# Patient Record
Sex: Female | Born: 1965 | Hispanic: No | Smoking: Never smoker
Health system: Southern US, Community
[De-identification: ages and names within clinical notes are randomized; demographics above are authoritative.]

## PROBLEM LIST (undated history)

## (undated) ENCOUNTER — Inpatient Hospital Stay (HOSPITAL_COMMUNITY): Payer: Medicare Other

## (undated) DIAGNOSIS — G43909 Migraine, unspecified, not intractable, without status migrainosus: Secondary | ICD-10-CM

## (undated) DIAGNOSIS — F32A Depression, unspecified: Secondary | ICD-10-CM

## (undated) DIAGNOSIS — W19XXXA Unspecified fall, initial encounter: Secondary | ICD-10-CM

## (undated) DIAGNOSIS — G4730 Sleep apnea, unspecified: Secondary | ICD-10-CM

## (undated) DIAGNOSIS — I2699 Other pulmonary embolism without acute cor pulmonale: Secondary | ICD-10-CM

## (undated) DIAGNOSIS — M797 Fibromyalgia: Secondary | ICD-10-CM

## (undated) DIAGNOSIS — K5792 Diverticulitis of intestine, part unspecified, without perforation or abscess without bleeding: Secondary | ICD-10-CM

## (undated) DIAGNOSIS — K219 Gastro-esophageal reflux disease without esophagitis: Secondary | ICD-10-CM

## (undated) DIAGNOSIS — R296 Repeated falls: Secondary | ICD-10-CM

## (undated) DIAGNOSIS — J45909 Unspecified asthma, uncomplicated: Secondary | ICD-10-CM

## (undated) DIAGNOSIS — IMO0002 Reserved for concepts with insufficient information to code with codable children: Secondary | ICD-10-CM

## (undated) DIAGNOSIS — R2681 Unsteadiness on feet: Secondary | ICD-10-CM

## (undated) DIAGNOSIS — M329 Systemic lupus erythematosus, unspecified: Secondary | ICD-10-CM

## (undated) DIAGNOSIS — Z6841 Body Mass Index (BMI) 40.0 and over, adult: Secondary | ICD-10-CM

## (undated) DIAGNOSIS — Z87440 Personal history of urinary (tract) infections: Secondary | ICD-10-CM

## (undated) DIAGNOSIS — N3281 Overactive bladder: Secondary | ICD-10-CM

## (undated) DIAGNOSIS — I10 Essential (primary) hypertension: Secondary | ICD-10-CM

## (undated) DIAGNOSIS — M1990 Unspecified osteoarthritis, unspecified site: Secondary | ICD-10-CM

## (undated) DIAGNOSIS — J302 Other seasonal allergic rhinitis: Secondary | ICD-10-CM

## (undated) DIAGNOSIS — E119 Type 2 diabetes mellitus without complications: Secondary | ICD-10-CM

## (undated) DIAGNOSIS — D649 Anemia, unspecified: Secondary | ICD-10-CM

## (undated) DIAGNOSIS — R32 Unspecified urinary incontinence: Secondary | ICD-10-CM

## (undated) HISTORY — PX: CHOLECYSTECTOMY: SHX55

## (undated) HISTORY — PX: ABDOMINAL HYSTERECTOMY: SHX81

## (undated) HISTORY — PX: TONSILLECTOMY: SUR1361

## (undated) HISTORY — PX: APPENDECTOMY: SHX54

## (undated) HISTORY — PX: DILATION AND CURETTAGE OF UTERUS: SHX78

## (undated) HISTORY — PX: ORIF ANKLE FRACTURE: SHX5408

## (undated) HISTORY — PX: LAMINECTOMY: SHX219

## (undated) HISTORY — PX: ROTATOR CUFF REPAIR: SHX139

---

## 1992-04-07 HISTORY — PX: LAPAROSCOPIC OVARIAN CYSTECTOMY: SUR786

## 2013-03-17 ENCOUNTER — Encounter (HOSPITAL_COMMUNITY): Payer: Self-pay | Admitting: Emergency Medicine

## 2013-03-17 ENCOUNTER — Emergency Department (HOSPITAL_COMMUNITY)
Admission: EM | Admit: 2013-03-17 | Discharge: 2013-03-18 | Disposition: A | Discharge status: Home / Self Care | Payer: Medicare Other | Attending: Emergency Medicine | Admitting: Emergency Medicine

## 2013-03-17 DIAGNOSIS — K219 Gastro-esophageal reflux disease without esophagitis: Secondary | ICD-10-CM | POA: Insufficient documentation

## 2013-03-17 DIAGNOSIS — R112 Nausea with vomiting, unspecified: Secondary | ICD-10-CM | POA: Insufficient documentation

## 2013-03-17 DIAGNOSIS — Z791 Long term (current) use of non-steroidal anti-inflammatories (NSAID): Secondary | ICD-10-CM | POA: Insufficient documentation

## 2013-03-17 DIAGNOSIS — Z79899 Other long term (current) drug therapy: Secondary | ICD-10-CM | POA: Insufficient documentation

## 2013-03-17 DIAGNOSIS — J45909 Unspecified asthma, uncomplicated: Secondary | ICD-10-CM | POA: Insufficient documentation

## 2013-03-17 DIAGNOSIS — Z3202 Encounter for pregnancy test, result negative: Secondary | ICD-10-CM | POA: Insufficient documentation

## 2013-03-17 DIAGNOSIS — IMO0002 Reserved for concepts with insufficient information to code with codable children: Secondary | ICD-10-CM | POA: Insufficient documentation

## 2013-03-17 DIAGNOSIS — IMO0001 Reserved for inherently not codable concepts without codable children: Secondary | ICD-10-CM | POA: Insufficient documentation

## 2013-03-17 DIAGNOSIS — E119 Type 2 diabetes mellitus without complications: Secondary | ICD-10-CM | POA: Insufficient documentation

## 2013-03-17 DIAGNOSIS — R000 Tachycardia, unspecified: Secondary | ICD-10-CM | POA: Insufficient documentation

## 2013-03-17 DIAGNOSIS — N83209 Unspecified ovarian cyst, unspecified side: Secondary | ICD-10-CM | POA: Insufficient documentation

## 2013-03-17 DIAGNOSIS — N390 Urinary tract infection, site not specified: Secondary | ICD-10-CM

## 2013-03-17 DIAGNOSIS — Z9089 Acquired absence of other organs: Secondary | ICD-10-CM | POA: Insufficient documentation

## 2013-03-17 DIAGNOSIS — Z792 Long term (current) use of antibiotics: Secondary | ICD-10-CM | POA: Insufficient documentation

## 2013-03-17 DIAGNOSIS — R197 Diarrhea, unspecified: Secondary | ICD-10-CM | POA: Insufficient documentation

## 2013-03-17 DIAGNOSIS — I10 Essential (primary) hypertension: Secondary | ICD-10-CM | POA: Insufficient documentation

## 2013-03-17 DIAGNOSIS — R509 Fever, unspecified: Secondary | ICD-10-CM | POA: Insufficient documentation

## 2013-03-17 DIAGNOSIS — R1031 Right lower quadrant pain: Secondary | ICD-10-CM | POA: Insufficient documentation

## 2013-03-17 DIAGNOSIS — R109 Unspecified abdominal pain: Secondary | ICD-10-CM

## 2013-03-17 HISTORY — DX: Unspecified asthma, uncomplicated: J45.909

## 2013-03-17 HISTORY — DX: Gastro-esophageal reflux disease without esophagitis: K21.9

## 2013-03-17 HISTORY — DX: Type 2 diabetes mellitus without complications: E11.9

## 2013-03-17 HISTORY — DX: Reserved for concepts with insufficient information to code with codable children: IMO0002

## 2013-03-17 HISTORY — DX: Fibromyalgia: M79.7

## 2013-03-17 HISTORY — DX: Systemic lupus erythematosus, unspecified: M32.9

## 2013-03-17 HISTORY — DX: Overactive bladder: N32.81

## 2013-03-17 HISTORY — DX: Essential (primary) hypertension: I10

## 2013-03-17 HISTORY — DX: Diverticulitis of intestine, part unspecified, without perforation or abscess without bleeding: K57.92

## 2013-03-17 LAB — CBC WITH DIFFERENTIAL/PLATELET
Basophils Relative: 0 % (ref 0–1)
Eosinophils Relative: 1 % (ref 0–5)
Hemoglobin: 11.3 g/dL — ABNORMAL LOW (ref 12.0–15.0)
Lymphocytes Relative: 22 % (ref 12–46)
Lymphs Abs: 2.8 10*3/uL (ref 0.7–4.0)
MCV: 76.5 fL — ABNORMAL LOW (ref 78.0–100.0)
Monocytes Absolute: 0.8 10*3/uL (ref 0.1–1.0)
Monocytes Relative: 7 % (ref 3–12)
Platelets: 317 10*3/uL (ref 150–400)
RBC: 4.51 MIL/uL (ref 3.87–5.11)
RDW: 16.2 % — ABNORMAL HIGH (ref 11.5–15.5)
WBC: 12.7 10*3/uL — ABNORMAL HIGH (ref 4.0–10.5)

## 2013-03-17 LAB — GLUCOSE, CAPILLARY: Glucose-Capillary: 146 mg/dL — ABNORMAL HIGH (ref 70–99)

## 2013-03-17 MED ORDER — MORPHINE SULFATE 4 MG/ML IJ SOLN
4.0000 mg | Freq: Once | INTRAMUSCULAR | Status: AC
  Administered 2013-03-17: 4 mg via INTRAVENOUS
  Filled 2013-03-17: qty 1

## 2013-03-17 MED ORDER — ONDANSETRON HCL 4 MG/2ML IJ SOLN
4.0000 mg | Freq: Once | INTRAMUSCULAR | Status: AC
  Administered 2013-03-17: 4 mg via INTRAVENOUS
  Filled 2013-03-17: qty 2

## 2013-03-17 MED ORDER — SODIUM CHLORIDE 0.9 % IV BOLUS (SEPSIS)
1000.0000 mL | Freq: Once | INTRAVENOUS | Status: AC
  Administered 2013-03-17: 1000 mL via INTRAVENOUS

## 2013-03-17 NOTE — ED Provider Notes (Signed)
CSN: [id number]     Arrival date & time 03/17/13  2031 History   First MD Initiated Contact with Patient 03/17/13 2258     Chief Complaint  Patient presents with  . Abdominal Pain   (Consider location/radiation/quality/duration/timing/severity/associated sxs/prior Treatment) HPI Comments: Pt is a morbidly obese 47 y/o female s/p cholecystectomy and diverticulitis in the past who presents with a c/o abdominal pain - this pain is located across the upper abdomen, has been intermittent over the last week but becoming gradually more intense and more frequent - seems to get worse after eating and is crampy in nature.  Not the same as prior diverticulitis - she has had no meds pta and admits to associated watery diarrhea X 2 today and has been having this intermittently over the week - has had n/v today as well.  She reports intermittent fevers over the past 2 weeks as high as 103 and today was at 101.  She has had no dysuria, no recent admissions to the hospital, no recent abx and no recent travel or sick exposures.  She denies being pregnant, states she is on her menses at this time.  Patient is a 47 y.o. female presenting with abdominal pain. The history is provided by the patient and the spouse.  Abdominal Pain   Past Medical History  Diagnosis Date  . Diverticulitis   . Asthma   . Diabetes mellitus without complication   . Hypertension   . Fibromyalgia   . Lupus   . GERD (gastroesophageal reflux disease)   . Overactive bladder    Past Surgical History  Procedure Laterality Date  . Cholecystectomy    . Appendectomy    . Cesarean section    . Rotator cuff repair    . Laminectomy    . Orif ankle fracture Left    History reviewed. No pertinent family history. History  Substance Use Topics  . Smoking status: Never Smoker   . Smokeless tobacco: Not on file  . Alcohol Use: No   OB History   Grav Para Term Preterm Abortions TAB SAB Ect Mult Living                 Review of  Systems  Gastrointestinal: Positive for abdominal pain.  All other systems reviewed and are negative.    Allergies  Dilaudid and Levaquin  Home Medications   Current Outpatient Rx  Name  Route  Sig  Dispense  Refill  . albuterol (PROVENTIL HFA;VENTOLIN HFA) 108 (90 BASE) MCG/ACT inhaler   Inhalation   Inhale 2 puffs into the lungs 2 (two) times daily.         . budesonide-formoterol (SYMBICORT) 160-4.5 MCG/ACT inhaler   Inhalation   Inhale 2 puffs into the lungs daily.         . fluticasone (FLONASE) 50 MCG/ACT nasal spray   Each Nare   Place 2 sprays into both nostrils daily as needed for allergies or rhinitis.         Marland Kitchen glipiZIDE (GLUCOTROL) 10 MG tablet   Oral   Take 10 mg by mouth daily before breakfast.         . ibuprofen (ADVIL,MOTRIN) 800 MG tablet   Oral   Take 800 mg by mouth every 6 (six) hours as needed for moderate pain.         Marland Kitchen lisinopril-hydrochlorothiazide (PRINZIDE,ZESTORETIC) 20-12.5 MG per tablet   Oral   Take 1 tablet by mouth daily.         Marland Kitchen  omeprazole (PRILOSEC) 40 MG capsule   Oral   Take 40 mg by mouth every evening.         Marland Kitchen oxybutynin (DITROPAN-XL) 10 MG 24 hr tablet   Oral   Take 10 mg by mouth daily.         Marland Kitchen oxyCODONE-acetaminophen (PERCOCET/ROXICET) 5-325 MG per tablet   Oral   Take 1 tablet by mouth every 12 (twelve) hours as needed for severe pain.         . pregabalin (LYRICA) 75 MG capsule   Oral   Take 75 mg by mouth 2 (two) times daily.         . sitaGLIPtin (JANUVIA) 25 MG tablet   Oral   Take 25 mg by mouth daily.         . cephALEXin (KEFLEX) 500 MG capsule   Oral   Take 1 capsule (500 mg total) by mouth 4 (four) times daily.   28 capsule   0   . naproxen (NAPROSYN) 500 MG tablet   Oral   Take 1 tablet (500 mg total) by mouth 2 (two) times daily with a meal.   30 tablet   0   . oxyCODONE-acetaminophen (PERCOCET/ROXICET) 5-325 MG per tablet   Oral   Take 2 tablets by mouth every 4  (four) hours as needed for severe pain.   15 tablet   0    BP 97/52  Pulse 95  Temp(Src) 98.6 F (37 C) (Oral)  Resp 16  Ht 5\' 6"  (1.676 m)  Wt 373 lb 4.8 oz (169.328 kg)  BMI 60.28 kg/m2  SpO2 100%  LMP 03/16/2013 Physical Exam  Nursing note and vitals reviewed. Constitutional: She appears well-developed and well-nourished. No distress.  HENT:  Head: Normocephalic and atraumatic.  Mouth/Throat: Oropharynx is clear and moist. No oropharyngeal exudate.  Eyes: Conjunctivae and EOM are normal. Pupils are equal, round, and reactive to light. Right eye exhibits no discharge. Left eye exhibits no discharge. No scleral icterus.  Neck: Normal range of motion. Neck supple. No JVD present. No thyromegaly present.  Cardiovascular: Regular rhythm, normal heart sounds and intact distal pulses.  Exam reveals no gallop and no friction rub.   No murmur heard. Tachycardic, strong pulses, no murmurs  Pulmonary/Chest: Effort normal and breath sounds normal. No respiratory distress. She has no wheezes. She has no rales.  Abdominal: Soft. Bowel sounds are normal. She exhibits no distension and no mass. There is tenderness.  ttp across the upper abd with mild guarding - very soft, no peritoneal signs, no tympanitic sounds to percussion, minimal LLQ ttp, no RLQ ttp.  Musculoskeletal: Normal range of motion. She exhibits no edema and no tenderness.  Lymphadenopathy:    She has no cervical adenopathy.  Neurological: She is alert. Coordination normal.  Skin: Skin is warm and dry. No rash noted. No erythema.  Psychiatric: She has a normal mood and affect. Her behavior is normal.    ED Course  Procedures (including critical care time) Labs Review Labs Reviewed  CBC WITH DIFFERENTIAL - Abnormal; Notable for the following:    WBC 12.7 (*)    Hemoglobin 11.3 (*)    HCT 34.5 (*)    MCV 76.5 (*)    MCH 25.1 (*)    RDW 16.2 (*)    Neutro Abs 8.9 (*)    All other components within normal limits   GLUCOSE, CAPILLARY - Abnormal; Notable for the following:    Glucose-Capillary 146 (*)  All other components within normal limits  COMPREHENSIVE METABOLIC PANEL - Abnormal; Notable for the following:    Glucose, Bld 152 (*)    Total Protein 8.6 (*)    Albumin 3.2 (*)    Total Bilirubin 0.2 (*)    All other components within normal limits  URINALYSIS, ROUTINE W REFLEX MICROSCOPIC - Abnormal; Notable for the following:    Color, Urine AMBER (*)    APPearance CLOUDY (*)    Specific Gravity, Urine 1.031 (*)    Hgb urine dipstick LARGE (*)    Bilirubin Urine SMALL (*)    Ketones, ur 15 (*)    Protein, ur 30 (*)    Leukocytes, UA SMALL (*)    All other components within normal limits  URINE MICROSCOPIC-ADD ON - Abnormal; Notable for the following:    Squamous Epithelial / LPF MANY (*)    Bacteria, UA MANY (*)    All other components within normal limits  URINE CULTURE  LIPASE, BLOOD  PREGNANCY, URINE   Imaging Review Ct Abdomen Pelvis W Contrast  03/18/2013   CLINICAL DATA:  Abdominal pain, nausea, vomiting and diarrhea.  EXAM: CT ABDOMEN AND PELVIS WITH CONTRAST  TECHNIQUE: Multidetector CT imaging of the abdomen and pelvis was performed using the standard protocol following bolus administration of intravenous contrast.  CONTRAST:  OMNIPAQUE IOHEXOL 300 MG/ML  SOLN  COMPARISON:  None.  FINDINGS: The visualized lung bases are clear.  The liver and spleen are unremarkable in appearance. The patient is status post cholecystectomy, with clips noted along the gallbladder fossa. The pancreas and adrenal glands are unremarkable.  The kidneys are unremarkable in appearance. There is no evidence of hydronephrosis. No renal or ureteral stones are seen. No perinephric stranding is appreciated.  No free fluid is identified. The small bowel is unremarkable in appearance. The stomach is within normal limits. No acute vascular abnormalities are seen.  The patient is status post appendectomy. The  colon is largely decompressed; minimal diverticulosis is noted along the distal descending colon.  The bladder is largely decompressed and grossly unremarkable in appearance. The uterus is grossly unremarkable in appearance. A large 10.8 cm cystic mass is noted at the left adnexa. This could be further assessed on pelvic ultrasound. The right ovary is unremarkable in appearance. No inguinal lymphadenopathy is seen.  No acute osseous abnormalities are identified.  IMPRESSION: 1. Large left adnexal cystic mass noted, measuring 10.8 cm. Pelvic ultrasound would be helpful for further evaluation, when and as deemed clinically appropriate. 2. Minimal diverticulosis noted along the distal descending colon, without evidence of diverticulitis.   Electronically Signed   By: Roanna Raider M.D.   On: 03/18/2013 04:10    EKG Interpretation   None       MDM   1. Abdominal pain   2. Pelvic cyst   3. UTI (lower urinary tract infection)    The pt has upper abd pain - consider pancreatitis, hepatitis, SBO though less likely with diarrhea, colitis / diverticulitis  - has a leukocytosis - will need complete metabolic panel and CT imaging as plain films will be low yield and she is at higher risk for inflammatory etiology s/a diverticulitis or colitis.  Fluids, pain meds, nausea meds.  CT scan reveals a large cystic adnexal mass, I have informed the patient of these results, she expressed understanding to the indications for followup, antibiotics for your infection, stable vital signs and repeat abdominal exam shows no significant tenderness.  Meds given in  ED:  Medications  iohexol (OMNIPAQUE) 300 MG/ML solution 25 mL (not administered)  morphine 4 MG/ML injection 4 mg (4 mg Intravenous Given 03/17/13 2336)  ondansetron (ZOFRAN) injection 4 mg (4 mg Intravenous Given 03/17/13 2334)  sodium chloride 0.9 % bolus 1,000 mL (0 mLs Intravenous Stopped 03/18/13 0242)  sodium chloride 0.9 % bolus 1,000 mL (1,000  mLs Intravenous New Bag/Given 03/18/13 0254)  morphine 4 MG/ML injection 4 mg (4 mg Intravenous Given 03/18/13 0244)  iohexol (OMNIPAQUE) 300 MG/ML solution 100 mL (100 mLs Intravenous Contrast Given 03/18/13 0326)    New Prescriptions   CEPHALEXIN (KEFLEX) 500 MG CAPSULE    Take 1 capsule (500 mg total) by mouth 4 (four) times daily.   NAPROXEN (NAPROSYN) 500 MG TABLET    Take 1 tablet (500 mg total) by mouth 2 (two) times daily with a meal.   OXYCODONE-ACETAMINOPHEN (PERCOCET/ROXICET) 5-325 MG PER TABLET    Take 2 tablets by mouth every 4 (four) hours as needed for severe pain.        Vida Roller, MD 03/18/13 [phone]

## 2013-03-17 NOTE — ED Notes (Signed)
Patient with abdominal pain that started a few days ago.  Patient states she has been having some nausea, vomiting and diarrhea.  Patient states she does have diverticulitis and thinks that it may be a flare up.

## 2013-03-18 ENCOUNTER — Encounter (HOSPITAL_COMMUNITY): Payer: Self-pay | Admitting: *Deleted

## 2013-03-18 ENCOUNTER — Emergency Department (HOSPITAL_COMMUNITY): Payer: Medicare Other

## 2013-03-18 LAB — COMPREHENSIVE METABOLIC PANEL
AST: 13 U/L (ref 0–37)
Albumin: 3.2 g/dL — ABNORMAL LOW (ref 3.5–5.2)
Alkaline Phosphatase: 95 U/L (ref 39–117)
BUN: 13 mg/dL (ref 6–23)
CO2: 29 mEq/L (ref 19–32)
Chloride: 97 mEq/L (ref 96–112)
GFR calc non Af Amer: >90 mL/min (ref >90)
Potassium: 3.7 mEq/L (ref 3.5–5.1)
Total Bilirubin: 0.2 mg/dL — ABNORMAL LOW (ref 0.3–1.2)

## 2013-03-18 LAB — LIPASE, BLOOD: Lipase: 19 U/L (ref 11–59)

## 2013-03-18 LAB — URINE MICROSCOPIC-ADD ON

## 2013-03-18 LAB — URINALYSIS, ROUTINE W REFLEX MICROSCOPIC
Glucose, UA: NEGATIVE mg/dL
Ketones, ur: 15 mg/dL — AB
Protein, ur: 30 mg/dL — AB
pH: 5.5 (ref 5.0–8.0)

## 2013-03-18 MED ORDER — OXYCODONE-ACETAMINOPHEN 5-325 MG PO TABS: 2.0000 | ORAL_TABLET | ORAL | Status: DC | PRN

## 2013-03-18 MED ORDER — MORPHINE SULFATE 4 MG/ML IJ SOLN
4.0000 mg | Freq: Once | INTRAMUSCULAR | Status: AC
  Administered 2013-03-18: 4 mg via INTRAVENOUS
  Filled 2013-03-18: qty 1

## 2013-03-18 MED ORDER — IOHEXOL 300 MG/ML  SOLN: 25.0000 mL | INTRAMUSCULAR | Status: AC

## 2013-03-18 MED ORDER — SODIUM CHLORIDE 0.9 % IV BOLUS (SEPSIS)
1000.0000 mL | Freq: Once | INTRAVENOUS | Status: AC
  Administered 2013-03-18: 1000 mL via INTRAVENOUS

## 2013-03-18 MED ORDER — IOHEXOL 300 MG/ML  SOLN
100.0000 mL | Freq: Once | INTRAMUSCULAR | Status: AC | PRN
  Administered 2013-03-18: 100 mL via INTRAVENOUS

## 2013-03-18 MED ORDER — NAPROXEN 500 MG PO TABS: 500.0000 mg | ORAL_TABLET | Freq: Two times a day (BID) | ORAL | Status: DC

## 2013-03-18 MED ORDER — CEPHALEXIN 500 MG PO CAPS: 500.0000 mg | ORAL_CAPSULE | Freq: Four times a day (QID) | ORAL | Status: DC

## 2013-03-18 NOTE — ED Notes (Signed)
Pt up to bathroom to obtain urine specimen.  Pt drinking contrast at this time.

## 2013-03-18 NOTE — ED Notes (Signed)
Patient transported to CT 

## 2013-03-19 LAB — URINE CULTURE: Colony Count: 90000

## 2013-03-29 ENCOUNTER — Encounter (HOSPITAL_COMMUNITY): Payer: Self-pay | Admitting: Emergency Medicine

## 2013-03-29 ENCOUNTER — Emergency Department (HOSPITAL_COMMUNITY): Payer: Medicare Other

## 2013-03-29 ENCOUNTER — Inpatient Hospital Stay (HOSPITAL_COMMUNITY)
Admission: EM | Admit: 2013-03-29 | Discharge: 2013-04-04 | DRG: 758 | Disposition: A | Discharge status: Home / Self Care | Payer: Medicare Other | Attending: Obstetrics & Gynecology | Admitting: Obstetrics & Gynecology

## 2013-03-29 DIAGNOSIS — N731 Chronic parametritis and pelvic cellulitis: Secondary | ICD-10-CM | POA: Diagnosis present

## 2013-03-29 DIAGNOSIS — E1165 Type 2 diabetes mellitus with hyperglycemia: Secondary | ICD-10-CM | POA: Diagnosis present

## 2013-03-29 DIAGNOSIS — K5792 Diverticulitis of intestine, part unspecified, without perforation or abscess without bleeding: Secondary | ICD-10-CM | POA: Diagnosis present

## 2013-03-29 DIAGNOSIS — R1032 Left lower quadrant pain: Secondary | ICD-10-CM | POA: Diagnosis present

## 2013-03-29 DIAGNOSIS — N7093 Salpingitis and oophoritis, unspecified: Secondary | ICD-10-CM | POA: Diagnosis present

## 2013-03-29 DIAGNOSIS — R160 Hepatomegaly, not elsewhere classified: Secondary | ICD-10-CM | POA: Diagnosis present

## 2013-03-29 DIAGNOSIS — IMO0002 Reserved for concepts with insufficient information to code with codable children: Secondary | ICD-10-CM | POA: Diagnosis present

## 2013-03-29 DIAGNOSIS — A499 Bacterial infection, unspecified: Secondary | ICD-10-CM | POA: Diagnosis present

## 2013-03-29 DIAGNOSIS — E119 Type 2 diabetes mellitus without complications: Secondary | ICD-10-CM

## 2013-03-29 DIAGNOSIS — K449 Diaphragmatic hernia without obstruction or gangrene: Secondary | ICD-10-CM | POA: Diagnosis present

## 2013-03-29 DIAGNOSIS — N739 Female pelvic inflammatory disease, unspecified: Secondary | ICD-10-CM | POA: Diagnosis present

## 2013-03-29 DIAGNOSIS — I10 Essential (primary) hypertension: Secondary | ICD-10-CM | POA: Diagnosis present

## 2013-03-29 DIAGNOSIS — N838 Other noninflammatory disorders of ovary, fallopian tube and broad ligament: Secondary | ICD-10-CM

## 2013-03-29 DIAGNOSIS — N390 Urinary tract infection, site not specified: Secondary | ICD-10-CM | POA: Diagnosis present

## 2013-03-29 DIAGNOSIS — N760 Acute vaginitis: Secondary | ICD-10-CM | POA: Diagnosis present

## 2013-03-29 DIAGNOSIS — M329 Systemic lupus erythematosus, unspecified: Secondary | ICD-10-CM

## 2013-03-29 DIAGNOSIS — K5732 Diverticulitis of large intestine without perforation or abscess without bleeding: Secondary | ICD-10-CM | POA: Diagnosis present

## 2013-03-29 DIAGNOSIS — K5730 Diverticulosis of large intestine without perforation or abscess without bleeding: Secondary | ICD-10-CM | POA: Diagnosis present

## 2013-03-29 DIAGNOSIS — R1900 Intra-abdominal and pelvic swelling, mass and lump, unspecified site: Secondary | ICD-10-CM

## 2013-03-29 DIAGNOSIS — B9689 Other specified bacterial agents as the cause of diseases classified elsewhere: Secondary | ICD-10-CM | POA: Diagnosis present

## 2013-03-29 DIAGNOSIS — Z6841 Body Mass Index (BMI) 40.0 and over, adult: Secondary | ICD-10-CM

## 2013-03-29 HISTORY — DX: Migraine, unspecified, not intractable, without status migrainosus: G43.909

## 2013-03-29 HISTORY — DX: Morbid (severe) obesity due to excess calories: E66.01

## 2013-03-29 HISTORY — DX: Other pulmonary embolism without acute cor pulmonale: I26.99

## 2013-03-29 HISTORY — DX: Body Mass Index (BMI) 40.0 and over, adult: Z684

## 2013-03-29 LAB — CBC
HCT: 31.5 % — ABNORMAL LOW (ref 36.0–46.0)
Hemoglobin: 10.1 g/dL — ABNORMAL LOW (ref 12.0–15.0)
MCH: 24.1 pg — ABNORMAL LOW (ref 26.0–34.0)
MCHC: 32.1 g/dL (ref 30.0–36.0)
RBC: 4.19 MIL/uL (ref 3.87–5.11)
WBC: 20.2 10*3/uL — ABNORMAL HIGH (ref 4.0–10.5)

## 2013-03-29 LAB — LIPASE, BLOOD: Lipase: 19 U/L (ref 11–59)

## 2013-03-29 LAB — COMPREHENSIVE METABOLIC PANEL
ALT: 11 U/L (ref 0–35)
AST: 14 U/L (ref 0–37)
Alkaline Phosphatase: 99 U/L (ref 39–117)
BUN: 15 mg/dL (ref 6–23)
CO2: 22 mEq/L (ref 19–32)
Calcium: 8.9 mg/dL (ref 8.4–10.5)
Chloride: 93 mEq/L — ABNORMAL LOW (ref 96–112)
GFR calc Af Amer: >90 mL/min (ref >90)
GFR calc non Af Amer: 79 mL/min — ABNORMAL LOW (ref >90)
Glucose, Bld: 261 mg/dL — ABNORMAL HIGH (ref 70–99)
Potassium: 4.1 mEq/L (ref 3.5–5.1)
Sodium: 130 mEq/L — ABNORMAL LOW (ref 135–145)
Total Protein: 7.9 g/dL (ref 6.0–8.3)

## 2013-03-29 LAB — URINALYSIS, ROUTINE W REFLEX MICROSCOPIC
Ketones, ur: NEGATIVE mg/dL
Nitrite: POSITIVE — AB
Protein, ur: 30 mg/dL — AB
Urobilinogen, UA: 1 mg/dL (ref 0.0–1.0)
pH: 5 (ref 5.0–8.0)

## 2013-03-29 LAB — CBC WITH DIFFERENTIAL/PLATELET
Basophils Relative: 0 % (ref 0–1)
Eosinophils Absolute: 0 10*3/uL (ref 0.0–0.7)
HCT: 33.2 % — ABNORMAL LOW (ref 36.0–46.0)
Hemoglobin: 10.9 g/dL — ABNORMAL LOW (ref 12.0–15.0)
Lymphocytes Relative: 3 % — ABNORMAL LOW (ref 12–46)
MCH: 24.4 pg — ABNORMAL LOW (ref 26.0–34.0)
Monocytes Absolute: 0.8 10*3/uL (ref 0.1–1.0)
Monocytes Relative: 4 % (ref 3–12)
Neutro Abs: 20.1 10*3/uL — ABNORMAL HIGH (ref 1.7–7.7)
Neutrophils Relative %: 93 % — ABNORMAL HIGH (ref 43–77)
RBC: 4.47 MIL/uL (ref 3.87–5.11)
WBC: 21.6 10*3/uL — ABNORMAL HIGH (ref 4.0–10.5)

## 2013-03-29 LAB — DIFFERENTIAL
Basophils Absolute: 0 10*3/uL (ref 0.0–0.1)
Basophils Relative: 0 % (ref 0–1)
Eosinophils Absolute: 0 10*3/uL (ref 0.0–0.7)
Eosinophils Relative: 0 % (ref 0–5)
Lymphs Abs: 0.5 10*3/uL — ABNORMAL LOW (ref 0.7–4.0)
Neutrophils Relative %: 94 % — ABNORMAL HIGH (ref 43–77)

## 2013-03-29 LAB — URINE MICROSCOPIC-ADD ON

## 2013-03-29 MED ORDER — DEXTROSE 5 % IV SOLN
1.0000 g | Freq: Once | INTRAVENOUS | Status: DC
  Administered 2013-03-29: 1 g via INTRAVENOUS
  Filled 2013-03-29: qty 10

## 2013-03-29 MED ORDER — IOHEXOL 300 MG/ML  SOLN
50.0000 mL | Freq: Once | INTRAMUSCULAR | Status: AC | PRN
  Administered 2013-03-29: 50 mL via ORAL

## 2013-03-29 MED ORDER — ACETAMINOPHEN 325 MG PO TABS
650.0000 mg | ORAL_TABLET | Freq: Once | ORAL | Status: AC
  Administered 2013-03-29: 650 mg via ORAL
  Filled 2013-03-29: qty 2

## 2013-03-29 MED ORDER — PIPERACILLIN-TAZOBACTAM 3.375 G IVPB 30 MIN
3.3750 g | Freq: Once | INTRAVENOUS | Status: AC
  Administered 2013-03-30: 3.375 g via INTRAVENOUS
  Filled 2013-03-29: qty 50

## 2013-03-29 MED ORDER — FENTANYL CITRATE 0.05 MG/ML IJ SOLN
50.0000 ug | Freq: Once | INTRAMUSCULAR | Status: AC
  Administered 2013-03-29: 50 ug via INTRAVENOUS
  Filled 2013-03-29: qty 2

## 2013-03-29 MED ORDER — MORPHINE SULFATE 4 MG/ML IJ SOLN
6.0000 mg | Freq: Once | INTRAMUSCULAR | Status: AC
  Administered 2013-03-29: 6 mg via INTRAVENOUS
  Filled 2013-03-29: qty 2

## 2013-03-29 MED ORDER — FENTANYL CITRATE 0.05 MG/ML IJ SOLN
100.0000 ug | Freq: Once | INTRAMUSCULAR | Status: AC
  Administered 2013-03-29: 100 ug via INTRAVENOUS
  Filled 2013-03-29: qty 2

## 2013-03-29 MED ORDER — SODIUM CHLORIDE 0.9 % IV BOLUS (SEPSIS)
1000.0000 mL | Freq: Once | INTRAVENOUS | Status: AC
  Administered 2013-03-29: 1000 mL via INTRAVENOUS

## 2013-03-29 MED ORDER — IOHEXOL 300 MG/ML  SOLN
100.0000 mL | Freq: Once | INTRAMUSCULAR | Status: AC | PRN
  Administered 2013-03-29: 100 mL via INTRAVENOUS

## 2013-03-29 NOTE — ED Notes (Signed)
Per EMS pt c/o LLQ pain that started today. Pt was seen at [facility] ED couple weeks ago and was told she has left ovarian cyst and supposed to follow up with [facility] clinic in Jan 2015. HR 115 134/82

## 2013-03-30 ENCOUNTER — Encounter (HOSPITAL_COMMUNITY): Payer: Self-pay | Admitting: Obstetrics & Gynecology

## 2013-03-30 DIAGNOSIS — Z6841 Body Mass Index (BMI) 40.0 and over, adult: Secondary | ICD-10-CM

## 2013-03-30 DIAGNOSIS — N7093 Salpingitis and oophoritis, unspecified: Secondary | ICD-10-CM | POA: Diagnosis present

## 2013-03-30 DIAGNOSIS — E119 Type 2 diabetes mellitus without complications: Secondary | ICD-10-CM | POA: Insufficient documentation

## 2013-03-30 DIAGNOSIS — N739 Female pelvic inflammatory disease, unspecified: Secondary | ICD-10-CM | POA: Diagnosis present

## 2013-03-30 DIAGNOSIS — N839 Noninflammatory disorder of ovary, fallopian tube and broad ligament, unspecified: Secondary | ICD-10-CM

## 2013-03-30 DIAGNOSIS — M329 Systemic lupus erythematosus, unspecified: Secondary | ICD-10-CM | POA: Insufficient documentation

## 2013-03-30 DIAGNOSIS — R1900 Intra-abdominal and pelvic swelling, mass and lump, unspecified site: Secondary | ICD-10-CM

## 2013-03-30 DIAGNOSIS — I10 Essential (primary) hypertension: Secondary | ICD-10-CM | POA: Insufficient documentation

## 2013-03-30 DIAGNOSIS — IMO0002 Reserved for concepts with insufficient information to code with codable children: Secondary | ICD-10-CM | POA: Insufficient documentation

## 2013-03-30 DIAGNOSIS — K5792 Diverticulitis of intestine, part unspecified, without perforation or abscess without bleeding: Secondary | ICD-10-CM | POA: Diagnosis present

## 2013-03-30 HISTORY — DX: Body Mass Index (BMI) 40.0 and over, adult: Z684

## 2013-03-30 HISTORY — DX: Morbid (severe) obesity due to excess calories: E66.01

## 2013-03-30 LAB — BASIC METABOLIC PANEL
BUN: 11 mg/dL (ref 6–23)
CO2: 26 mEq/L (ref 19–32)
Calcium: 8 mg/dL — ABNORMAL LOW (ref 8.4–10.5)
Chloride: 97 mEq/L (ref 96–112)
Creatinine, Ser: 0.66 mg/dL (ref 0.50–1.10)
GFR calc Af Amer: >90 mL/min (ref >90)
Sodium: 132 mEq/L — ABNORMAL LOW (ref 135–145)

## 2013-03-30 LAB — CBC WITH DIFFERENTIAL/PLATELET
Basophils Absolute: 0 10*3/uL (ref 0.0–0.1)
Basophils Relative: 0 % (ref 0–1)
Eosinophils Relative: 0 % (ref 0–5)
HCT: 27.6 % — ABNORMAL LOW (ref 36.0–46.0)
Lymphocytes Relative: 7 % — ABNORMAL LOW (ref 12–46)
Lymphs Abs: 0.9 10*3/uL (ref 0.7–4.0)
Monocytes Absolute: 1.2 10*3/uL — ABNORMAL HIGH (ref 0.1–1.0)
Neutro Abs: 11.4 10*3/uL — ABNORMAL HIGH (ref 1.7–7.7)
Neutrophils Relative %: 84 % — ABNORMAL HIGH (ref 43–77)
Platelets: 259 10*3/uL (ref 150–400)
RDW: 16.2 % — ABNORMAL HIGH (ref 11.5–15.5)
WBC: 13.6 10*3/uL — ABNORMAL HIGH (ref 4.0–10.5)

## 2013-03-30 LAB — GC/CHLAMYDIA PROBE AMP
CT Probe RNA: NEGATIVE
GC Probe RNA: NEGATIVE

## 2013-03-30 LAB — CA 125: CA 125: 8.9 U/mL (ref 0.0–30.2)

## 2013-03-30 LAB — WET PREP, GENITAL: Trich, Wet Prep: NONE SEEN

## 2013-03-30 LAB — TYPE AND SCREEN
ABO/RH(D): O POS
Antibody Screen: NEGATIVE

## 2013-03-30 LAB — GLUCOSE, CAPILLARY: Glucose-Capillary: 229 mg/dL — ABNORMAL HIGH (ref 70–99)

## 2013-03-30 LAB — ABO/RH: ABO/RH(D): O POS

## 2013-03-30 MED ORDER — OXYBUTYNIN CHLORIDE ER 5 MG PO TB24
10.0000 mg | ORAL_TABLET | Freq: Every day | ORAL | Status: DC
  Administered 2013-03-30 – 2013-04-03 (×4): 10 mg via ORAL
  Filled 2013-03-30 (×7): qty 2

## 2013-03-30 MED ORDER — IBUPROFEN 600 MG PO TABS
600.0000 mg | ORAL_TABLET | Freq: Four times a day (QID) | ORAL | Status: DC | PRN
  Administered 2013-03-30 – 2013-04-03 (×5): 600 mg via ORAL
  Filled 2013-03-30: qty 1
  Filled 2013-03-30: qty 3
  Filled 2013-03-30 (×4): qty 1

## 2013-03-30 MED ORDER — ALBUTEROL SULFATE HFA 108 (90 BASE) MCG/ACT IN AERS
2.0000 | INHALATION_SPRAY | Freq: Two times a day (BID) | RESPIRATORY_TRACT | Status: DC
  Administered 2013-03-30 – 2013-04-04 (×11): 2 via RESPIRATORY_TRACT
  Filled 2013-03-30: qty 6.7

## 2013-03-30 MED ORDER — HYDROMORPHONE HCL PF 1 MG/ML IJ SOLN: 1.0000 mg | INTRAMUSCULAR | Status: DC | PRN

## 2013-03-30 MED ORDER — GLIPIZIDE 10 MG PO TABS
10.0000 mg | ORAL_TABLET | Freq: Every day | ORAL | Status: DC
  Administered 2013-03-30: 10 mg via ORAL
  Filled 2013-03-30 (×2): qty 1

## 2013-03-30 MED ORDER — LINAGLIPTIN 5 MG PO TABS
5.0000 mg | ORAL_TABLET | Freq: Every day | ORAL | Status: DC
  Administered 2013-03-30: 5 mg via ORAL
  Filled 2013-03-30 (×2): qty 1

## 2013-03-30 MED ORDER — LINAGLIPTIN 5 MG PO TABS
5.0000 mg | ORAL_TABLET | Freq: Every day | ORAL | Status: DC
  Administered 2013-03-31 – 2013-04-04 (×4): 5 mg via ORAL
  Filled 2013-03-30 (×6): qty 1

## 2013-03-30 MED ORDER — SODIUM CHLORIDE 0.9 % IV SOLN
500.0000 mg | Freq: Four times a day (QID) | INTRAVENOUS | Status: DC
  Administered 2013-03-30 (×2): 500 mg via INTRAVENOUS
  Administered 2013-03-30: 14:00:00 via INTRAVENOUS
  Administered 2013-03-30 – 2013-04-02 (×12): 500 mg via INTRAVENOUS
  Filled 2013-03-30 (×16): qty 500

## 2013-03-30 MED ORDER — PANTOPRAZOLE SODIUM 40 MG PO TBEC
80.0000 mg | DELAYED_RELEASE_TABLET | Freq: Every day | ORAL | Status: DC
  Administered 2013-03-30 – 2013-04-04 (×6): 80 mg via ORAL
  Filled 2013-03-30 (×7): qty 2

## 2013-03-30 MED ORDER — LISINOPRIL-HYDROCHLOROTHIAZIDE 20-12.5 MG PO TABS: 1.0000 | ORAL_TABLET | Freq: Every day | ORAL | Status: DC

## 2013-03-30 MED ORDER — BUDESONIDE-FORMOTEROL FUMARATE 160-4.5 MCG/ACT IN AERO
2.0000 | INHALATION_SPRAY | Freq: Every day | RESPIRATORY_TRACT | Status: DC
  Administered 2013-03-30 – 2013-04-04 (×6): 2 via RESPIRATORY_TRACT
  Filled 2013-03-30: qty 6

## 2013-03-30 MED ORDER — MORPHINE SULFATE 4 MG/ML IJ SOLN
INTRAMUSCULAR | Status: AC
  Filled 2013-03-30: qty 1

## 2013-03-30 MED ORDER — LACTATED RINGERS IV SOLN
INTRAVENOUS | Status: DC
  Administered 2013-03-30 – 2013-03-31 (×3): via INTRAVENOUS

## 2013-03-30 MED ORDER — FENTANYL CITRATE 0.05 MG/ML IJ SOLN
100.0000 ug | INTRAMUSCULAR | Status: DC | PRN
  Administered 2013-03-30: 100 ug via INTRAVENOUS
  Filled 2013-03-30: qty 2

## 2013-03-30 MED ORDER — ALUM & MAG HYDROXIDE-SIMETH 200-200-20 MG/5ML PO SUSP: 30.0000 mL | ORAL | Status: DC | PRN

## 2013-03-30 MED ORDER — DOCUSATE SODIUM 100 MG PO CAPS
100.0000 mg | ORAL_CAPSULE | Freq: Two times a day (BID) | ORAL | Status: DC | PRN
  Administered 2013-04-01 – 2013-04-03 (×3): 100 mg via ORAL
  Filled 2013-03-30 (×3): qty 1

## 2013-03-30 MED ORDER — GLIPIZIDE 10 MG PO TABS
10.0000 mg | ORAL_TABLET | Freq: Two times a day (BID) | ORAL | Status: DC
  Filled 2013-03-30: qty 1

## 2013-03-30 MED ORDER — MORPHINE SULFATE 4 MG/ML IJ SOLN
2.0000 mg | INTRAMUSCULAR | Status: DC | PRN
  Administered 2013-03-30 (×3): 4 mg via INTRAVENOUS
  Administered 2013-04-01 (×2): 2 mg via INTRAVENOUS
  Filled 2013-03-30 (×2): qty 1

## 2013-03-30 MED ORDER — ONDANSETRON HCL 4 MG/2ML IJ SOLN: 4.0000 mg | Freq: Four times a day (QID) | INTRAMUSCULAR | Status: DC | PRN

## 2013-03-30 MED ORDER — ENOXAPARIN SODIUM 80 MG/0.8ML ~~LOC~~ SOLN
80.0000 mg | SUBCUTANEOUS | Status: DC
  Administered 2013-03-30 – 2013-04-04 (×5): 80 mg via SUBCUTANEOUS
  Filled 2013-03-30 (×7): qty 0.8

## 2013-03-30 MED ORDER — FLUTICASONE PROPIONATE 50 MCG/ACT NA SUSP
2.0000 | Freq: Every day | NASAL | Status: DC | PRN
  Administered 2013-03-30: 2 via NASAL
  Filled 2013-03-30: qty 16

## 2013-03-30 MED ORDER — SUMATRIPTAN SUCCINATE 50 MG PO TABS
100.0000 mg | ORAL_TABLET | ORAL | Status: DC | PRN
  Administered 2013-03-30: 100 mg via ORAL
  Filled 2013-03-30: qty 2

## 2013-03-30 MED ORDER — DOXYCYCLINE HYCLATE 100 MG PO TABS
100.0000 mg | ORAL_TABLET | Freq: Two times a day (BID) | ORAL | Status: DC
  Administered 2013-03-30: 100 mg via ORAL
  Filled 2013-03-30 (×2): qty 1

## 2013-03-30 MED ORDER — LINAGLIPTIN 5 MG PO TABS: 10.0000 mg | ORAL_TABLET | Freq: Every day | ORAL | Status: DC

## 2013-03-30 MED ORDER — DIPHENHYDRAMINE HCL 25 MG PO CAPS: 50.0000 mg | ORAL_CAPSULE | Freq: Four times a day (QID) | ORAL | Status: DC | PRN

## 2013-03-30 MED ORDER — METFORMIN HCL 500 MG PO TABS
500.0000 mg | ORAL_TABLET | Freq: Two times a day (BID) | ORAL | Status: DC
  Administered 2013-03-31 – 2013-04-04 (×9): 500 mg via ORAL
  Filled 2013-03-30 (×11): qty 1

## 2013-03-30 MED ORDER — HYDROCODONE-ACETAMINOPHEN 5-325 MG PO TABS: 2.0000 | ORAL_TABLET | ORAL | Status: DC | PRN

## 2013-03-30 MED ORDER — GLIPIZIDE 10 MG PO TABS
10.0000 mg | ORAL_TABLET | Freq: Two times a day (BID) | ORAL | Status: DC
  Administered 2013-03-30 – 2013-04-04 (×10): 10 mg via ORAL
  Filled 2013-03-30 (×12): qty 1

## 2013-03-30 MED ORDER — LISINOPRIL 20 MG PO TABS
20.0000 mg | ORAL_TABLET | Freq: Every day | ORAL | Status: DC
  Administered 2013-03-30 – 2013-04-04 (×4): 20 mg via ORAL
  Filled 2013-03-30 (×8): qty 1

## 2013-03-30 MED ORDER — HYDROCHLOROTHIAZIDE 12.5 MG PO CAPS
12.5000 mg | ORAL_CAPSULE | Freq: Every day | ORAL | Status: DC
  Administered 2013-03-30 – 2013-04-04 (×5): 12.5 mg via ORAL
  Filled 2013-03-30 (×7): qty 1

## 2013-03-30 MED ORDER — OXYCODONE-ACETAMINOPHEN 5-325 MG PO TABS
2.0000 | ORAL_TABLET | ORAL | Status: DC | PRN
  Administered 2013-03-30 – 2013-04-03 (×12): 2 via ORAL
  Filled 2013-03-30 (×12): qty 2

## 2013-03-30 MED ORDER — ZOLPIDEM TARTRATE 5 MG PO TABS
5.0000 mg | ORAL_TABLET | Freq: Every evening | ORAL | Status: DC | PRN
  Administered 2013-03-30 – 2013-04-01 (×2): 5 mg via ORAL
  Filled 2013-03-30 (×2): qty 1

## 2013-03-30 MED ORDER — ACETAMINOPHEN 500 MG PO TABS
1000.0000 mg | ORAL_TABLET | Freq: Four times a day (QID) | ORAL | Status: DC | PRN
  Administered 2013-03-31: 1000 mg via ORAL
  Filled 2013-03-30: qty 2

## 2013-03-30 MED ORDER — PRENATAL MULTIVITAMIN CH
1.0000 | ORAL_TABLET | Freq: Every day | ORAL | Status: DC
  Administered 2013-03-31 – 2013-04-04 (×3): 1 via ORAL
  Filled 2013-03-30 (×3): qty 1

## 2013-03-30 MED ORDER — ONDANSETRON HCL 4 MG PO TABS
4.0000 mg | ORAL_TABLET | Freq: Four times a day (QID) | ORAL | Status: DC | PRN
  Administered 2013-03-31: 4 mg via ORAL
  Filled 2013-03-30: qty 1

## 2013-03-30 MED ORDER — PREGABALIN 75 MG PO CAPS
75.0000 mg | ORAL_CAPSULE | Freq: Two times a day (BID) | ORAL | Status: DC
  Administered 2013-03-30 – 2013-04-04 (×9): 75 mg via ORAL
  Filled 2013-03-30 (×9): qty 1

## 2013-03-30 NOTE — Progress Notes (Signed)
Ur chart review completed.  

## 2013-03-30 NOTE — ED Provider Notes (Signed)
CSN: [id number]     Arrival date & time 03/29/13  1545 History   First MD Initiated Contact with Patient 03/29/13 1817     Chief Complaint  Patient presents with  . Abdominal Pain    LLQ   (Consider location/radiation/quality/duration/timing/severity/associated sxs/prior Treatment) Patient is a 47 y.o. female presenting with abdominal pain. The history is provided by the patient.  Abdominal Pain Pain location:  LLQ Pain quality: aching and sharp   Pain radiates to:  L flank Pain severity:  Severe Onset quality:  Gradual Duration:  2 weeks Timing:  Constant Progression:  Worsening Chronicity:  New Context: not sick contacts and not suspicious food intake   Relieved by:  Nothing Worsened by:  Nothing tried Ineffective treatments:  OTC medications Associated symptoms: nausea and vaginal bleeding   Associated symptoms: no anorexia, no chest pain, no chills, no constipation, no cough, no diarrhea, no dysuria, no fatigue, no fever, no hematuria, no shortness of breath, no sore throat, no vaginal discharge and no vomiting   Vaginal bleeding:    Quality:  Clots   Severity:  Moderate   Onset quality:  Gradual   Duration:  3 weeks   Progression:  Waxing and waning   Chronicity:  New Risk factors: obesity     Past Medical History  Diagnosis Date  . Diverticulitis   . Asthma   . Diabetes mellitus without complication   . Hypertension   . Fibromyalgia   . Lupus   . GERD (gastroesophageal reflux disease)   . Overactive bladder   . Migraine   . Morbid obesity 03/30/2013  . Pulmonary embolism     Age 23   Past Surgical History  Procedure Laterality Date  . Cholecystectomy    . Appendectomy    . Cesarean section  2001  . Rotator cuff repair    . Laminectomy    . Orif ankle fracture Left   . Dilation and curettage of uterus      x 3 for AUB  . Laparoscopic ovarian cystectomy Right 1994   No family history on file. History  Substance Use Topics  . Smoking status: Never  Smoker   . Smokeless tobacco: Not on file  . Alcohol Use: No   OB History   Grav Para Term Preterm Abortions TAB SAB Ect Mult Living   3 1 0 1 2 0 2 0 0 1      Review of Systems  Constitutional: Negative for fever, chills, diaphoresis, activity change, appetite change and fatigue.  HENT: Negative for congestion, facial swelling, rhinorrhea and sore throat.   Eyes: Negative for photophobia and discharge.  Respiratory: Negative for cough, chest tightness and shortness of breath.   Cardiovascular: Negative for chest pain, palpitations and leg swelling.  Gastrointestinal: Positive for nausea and abdominal pain. Negative for vomiting, diarrhea, constipation and anorexia.  Endocrine: Negative for polydipsia and polyuria.  Genitourinary: Positive for vaginal bleeding. Negative for dysuria, frequency, hematuria, vaginal discharge, difficulty urinating and pelvic pain.  Musculoskeletal: Negative for arthralgias, back pain, neck pain and neck stiffness.  Skin: Negative for color change and wound.  Allergic/Immunologic: Negative for immunocompromised state.  Neurological: Negative for facial asymmetry, weakness, numbness and headaches.  Hematological: Does not bruise/bleed easily.  Psychiatric/Behavioral: Negative for confusion and agitation.    Allergies  Dilaudid and Levaquin  Home Medications  No current outpatient prescriptions on file. BP 131/83  Pulse 92  Temp(Src) 97.5 F (36.4 C) (Oral)  Resp 20  Ht 5\' 6"  (1.676  m)  Wt 374 lb (169.645 kg)  BMI 60.39 kg/m2  SpO2 97%  LMP 03/16/2013 Physical Exam  Constitutional: She is oriented to person, place, and time. She appears well-developed and well-nourished. No distress.  HENT:  Head: Normocephalic and atraumatic.  Mouth/Throat: No oropharyngeal exudate.  Eyes: Pupils are equal, round, and reactive to light.  Neck: Normal range of motion. Neck supple.  Cardiovascular: Normal rate, regular rhythm and normal heart sounds.  Exam  reveals no gallop and no friction rub.   No murmur heard. Pulmonary/Chest: Effort normal and breath sounds normal. No respiratory distress. She has no wheezes. She has no rales.  Abdominal: Soft. Bowel sounds are normal. She exhibits no distension and no mass. There is tenderness in the left lower quadrant. There is no rigidity, no rebound, no guarding and no CVA tenderness.  Genitourinary: Left adnexum displays tenderness. There is bleeding around the vagina.  Musculoskeletal: Normal range of motion. She exhibits no edema and no tenderness.  Neurological: She is alert and oriented to person, place, and time.  Skin: Skin is warm and dry.  Psychiatric: She has a normal mood and affect.    ED Course  Procedures (including critical care time) Labs Review Labs Reviewed  WET PREP, GENITAL - Abnormal; Notable for the following:    Clue Cells Wet Prep HPF POC MODERATE (*)    WBC, Wet Prep HPF POC MODERATE (*)    All other components within normal limits  CBC WITH DIFFERENTIAL - Abnormal; Notable for the following:    WBC 21.6 (*)    Hemoglobin 10.9 (*)    HCT 33.2 (*)    MCV 74.3 (*)    MCH 24.4 (*)    RDW 16.0 (*)    Neutrophils Relative % 93 (*)    Neutro Abs 20.1 (*)    Lymphocytes Relative 3 (*)    All other components within normal limits  CBC - Abnormal; Notable for the following:    WBC 20.2 (*)    Hemoglobin 10.1 (*)    HCT 31.5 (*)    MCV 75.2 (*)    MCH 24.1 (*)    RDW 16.0 (*)    All other components within normal limits  COMPREHENSIVE METABOLIC PANEL - Abnormal; Notable for the following:    Sodium 130 (*)    Chloride 93 (*)    Glucose, Bld 261 (*)    Albumin 2.7 (*)    GFR calc non Af Amer 79 (*)    All other components within normal limits  DIFFERENTIAL - Abnormal; Notable for the following:    Neutrophils Relative % 94 (*)    Neutro Abs 19.0 (*)    Lymphocytes Relative 3 (*)    Lymphs Abs 0.5 (*)    All other components within normal limits  URINALYSIS,  ROUTINE W REFLEX MICROSCOPIC - Abnormal; Notable for the following:    Color, Urine ORANGE (*)    APPearance TURBID (*)    Glucose, UA 250 (*)    Hgb urine dipstick LARGE (*)    Bilirubin Urine MODERATE (*)    Protein, ur 30 (*)    Nitrite POSITIVE (*)    Leukocytes, UA SMALL (*)    All other components within normal limits  URINE MICROSCOPIC-ADD ON - Abnormal; Notable for the following:    Bacteria, UA MANY (*)    Casts GRANULAR CAST (*)    All other components within normal limits  CBC WITH DIFFERENTIAL - Abnormal; Notable for the  following:    WBC 13.6 (*)    RBC 3.73 (*)    Hemoglobin 8.9 (*)    HCT 27.6 (*)    MCV 74.0 (*)    MCH 23.9 (*)    RDW 16.2 (*)    Neutrophils Relative % 84 (*)    Neutro Abs 11.4 (*)    Lymphocytes Relative 7 (*)    Monocytes Absolute 1.2 (*)    All other components within normal limits  BASIC METABOLIC PANEL - Abnormal; Notable for the following:    Sodium 132 (*)    Glucose, Bld 219 (*)    Calcium 8.0 (*)    All other components within normal limits  GLUCOSE, CAPILLARY - Abnormal; Notable for the following:    Glucose-Capillary 229 (*)    All other components within normal limits  GC/CHLAMYDIA PROBE AMP  URINE CULTURE  CULTURE, BLOOD (ROUTINE X 2)  CULTURE, BLOOD (ROUTINE X 2)  LIPASE, BLOOD  CA 125  POCT PREGNANCY, URINE  TYPE AND SCREEN  ABO/RH   Imaging Review US Transvaginal Non-ob  03/29/2013   CLINICAL DATA:  Left lower quadrant pain. Known left ovarian mass. Elevated white blood cell count.  EXAM: TRANSABDOMINAL AND TRANSVAGINAL ULTRASOUND OF PELVIS  DOPPLER ULTRASOUND OF OVARIES  TECHNIQUE: Both transabdominal and transvaginal ultrasound examinations of the pelvis were performed. Transabdominal technique was performed for global imaging of the pelvis including uterus, ovaries, adnexal regions, and pelvic cul-de-sac.  It was necessary to proceed with endovaginal exam following the transabdominal exam to visualize the ovaries.  Color and duplex Doppler ultrasound was utilized to evaluate blood flow to the ovaries.  COMPARISON:  CT 03/18/2013.  Marland Kitchen  FINDINGS: Uterus  Measurements: 13.9 cm x 5.6 min cm by 6.4 cm. No fibroids or other mass visualized.  Endometrium  Thickness: 10 mm.  No focal abnormality visualized.  Right ovary  Not visualized.  Left ovary  Measurements: 10.2 x 10.1 x 10.8 cm. Complex cystic lesion is present with thick septation. The lesion appears predominantly anechoic. The septation measures 3 mm in thickness. Internal flow is present (image 35). Blood flow is present in the septation. Allowing for technical differences, this appears little changed compared to 03/18/2013. There is no ovarian torsion. Evaluation is technically degraded by body habitus.  Pulsed Doppler evaluation of both ovaries demonstrates normal low-resistance arterial and venous waveforms.  Other findings  No free fluid.  IMPRESSION: 1. No acute abnormality.  Negative for ovarian torsion. 2. Unchanged left ovarian cystic mass measuring 10.8 cm long axis. 3 mm thick septation with internal vascular flow raises the possibility of ovarian neoplasm and followup gynecologic consultation is recommended. 3. Nonvisualization of the right ovary.   Electronically Signed   By: Andreas Newport M.D.   On: 03/29/2013 20:06   US Pelvis Complete  03/29/2013   CLINICAL DATA:  Left lower quadrant pain. Known left ovarian mass. Elevated white blood cell count.  EXAM: TRANSABDOMINAL AND TRANSVAGINAL ULTRASOUND OF PELVIS  DOPPLER ULTRASOUND OF OVARIES  TECHNIQUE: Both transabdominal and transvaginal ultrasound examinations of the pelvis were performed. Transabdominal technique was performed for global imaging of the pelvis including uterus, ovaries, adnexal regions, and pelvic cul-de-sac.  It was necessary to proceed with endovaginal exam following the transabdominal exam to visualize the ovaries. Color and duplex Doppler ultrasound was utilized to evaluate blood flow to  the ovaries.  COMPARISON:  CT 03/18/2013.  Marland Kitchen  FINDINGS: Uterus  Measurements: 13.9 cm x 5.6 min cm by 6.4 cm. No  fibroids or other mass visualized.  Endometrium  Thickness: 10 mm.  No focal abnormality visualized.  Right ovary  Not visualized.  Left ovary  Measurements: 10.2 x 10.1 x 10.8 cm. Complex cystic lesion is present with thick septation. The lesion appears predominantly anechoic. The septation measures 3 mm in thickness. Internal flow is present (image 35). Blood flow is present in the septation. Allowing for technical differences, this appears little changed compared to 03/18/2013. There is no ovarian torsion. Evaluation is technically degraded by body habitus.  Pulsed Doppler evaluation of both ovaries demonstrates normal low-resistance arterial and venous waveforms.  Other findings  No free fluid.  IMPRESSION: 1. No acute abnormality.  Negative for ovarian torsion. 2. Unchanged left ovarian cystic mass measuring 10.8 cm long axis. 3 mm thick septation with internal vascular flow raises the possibility of ovarian neoplasm and followup gynecologic consultation is recommended. 3. Nonvisualization of the right ovary.   Electronically Signed   By: Andreas Newport M.D.   On: 03/29/2013 20:06   Ct Abdomen Pelvis W Contrast  03/29/2013   CLINICAL DATA:  Left lower quadrant abdominal pain. Nausea, vomiting, and diarrhea.  EXAM: CT ABDOMEN AND PELVIS WITH CONTRAST  TECHNIQUE: Multidetector CT imaging of the abdomen and pelvis was performed using the standard protocol following bolus administration of intravenous contrast.  CONTRAST:  50mL OMNIPAQUE IOHEXOL 300 MG/ML SOLN, OMNIPAQUE IOHEXOL 300 MG/ML SOLN  COMPARISON:  Ultrasound 03/29/2013.  CT 03/18/2013.  FINDINGS: Lung Bases: Dependent atelectasis.  Liver: Mild hepatomegaly with liver span of 19.6 cm. Rounding of the inferior right hepatic lobe.  Spleen:  Normal.  Gallbladder:  Surgically absent.  Common bile duct:  Normal.  Pancreas:  Fatty atrophy  of the uncinate.  Adrenal glands:  Normal.  Kidneys: Normal enhancement and delayed excretion of contrast. The ureters appear within normal limits. There is no hydronephrosis. No calculi.  Stomach:  Partially collapsed.  Tiny hiatal hernia.  Small bowel: Proximal small bowel appears normal. There is no small bowel obstruction. Contrast is present in the jejunum. No contrast in the ileum.  Colon: Appendix not identified. Proximal colon appears normal. Descending colonic diverticulosis.  Pelvic Genitourinary: Septated mass is present in the anatomic pelvis. Compared to the prior CT exam, this has increased in size, now measuring 12.5 cm x 11.2 cm. Peripheral enhancement is present. There is stranding around the mass which demonstrates central low attenuation. The mass is apposed to the left uterine fundus. This is insinuated around the sigmoid colon. Mild fat stranding is present around the mass. Craniocaudal measurement of the mass is 9.5 cm. Urinary bladder is decompressed.  Enlarged retroperitoneal lymph nodes are present. Periaortic node measures 14 mm short axis. Left external iliac node measures 13 mm short axis. Smaller nodes are present along the pelvic side walls.  Bones: Lumbar spondylosis and facet arthrosis. No aggressive osseous lesions. Bilateral SI joint degenerative disease. Small focus of left femoral head AVN. Thoracic spine DISH.  Vasculature: No acute vascular abnormality.  Body Wall: Periumbilical scarring.  IMPRESSION: 1. Enlarging pelvic mass to the left of the uterus. The differential considerations include adnexal mass including ovarian neoplasm, a sub serosal cystic degenerating fibroid, or complex large ovarian cyst. Tubo-ovarian abscess is in the differential considerations, particularly with stranding around the mass. Abscess associated with diverticulitis is considered less likely but not excluded Gynecologic consultation recommended. 2. Retroperitoneal periaortic adenopathy which may be  reactive or neoplastic. 3. Cholecystectomy.   Electronically Signed   By: Andreas Newport  M.D.   On: 03/29/2013 22:26   [place] Art/ven Flow Abd Pelv Doppler  03/29/2013   CLINICAL DATA:  Left lower quadrant pain. Known left ovarian mass. Elevated white blood cell count.  EXAM: TRANSABDOMINAL AND TRANSVAGINAL ULTRASOUND OF PELVIS  DOPPLER ULTRASOUND OF OVARIES  TECHNIQUE: Both transabdominal and transvaginal ultrasound examinations of the pelvis were performed. Transabdominal technique was performed for global imaging of the pelvis including uterus, ovaries, adnexal regions, and pelvic cul-de-sac.  It was necessary to proceed with endovaginal exam following the transabdominal exam to visualize the ovaries. Color and duplex Doppler ultrasound was utilized to evaluate blood flow to the ovaries.  COMPARISON:  CT 03/18/2013.  Marland Kitchen  FINDINGS: Uterus  Measurements: 13.9 cm x 5.6 min cm by 6.4 cm. No fibroids or other mass visualized.  Endometrium  Thickness: 10 mm.  No focal abnormality visualized.  Right ovary  Not visualized.  Left ovary  Measurements: 10.2 x 10.1 x 10.8 cm. Complex cystic lesion is present with thick septation. The lesion appears predominantly anechoic. The septation measures 3 mm in thickness. Internal flow is present (image 35). Blood flow is present in the septation. Allowing for technical differences, this appears little changed compared to 03/18/2013. There is no ovarian torsion. Evaluation is technically degraded by body habitus.  Pulsed Doppler evaluation of both ovaries demonstrates normal low-resistance arterial and venous waveforms.  Other findings  No free fluid.  IMPRESSION: 1. No acute abnormality.  Negative for ovarian torsion. 2. Unchanged left ovarian cystic mass measuring 10.8 cm long axis. 3 mm thick septation with internal vascular flow raises the possibility of ovarian neoplasm and followup gynecologic consultation is recommended. 3. Nonvisualization of the right ovary.   Electronically  Signed   By: Andreas Newport M.D.   On: 03/29/2013 20:06    EKG Interpretation    Date/Time:    Ventricular Rate:    PR Interval:    QRS Duration:   QT Interval:    QTC Calculation:   R Axis:     Text Interpretation:              MDM   1. Ovarian mass   2. Diabetes mellitus without complication   3. Hypertension   4. Lupus   5. Morbid obesity with BMI of 60.0-69.9, adult   6. Pelvic mass   7. TOA (tubo-ovarian abscess)   8. Diverticulitis    Pt is a 47 y.o. female with Pmhx as above who presents with 2 weeks of worsening LLQ pain since being diagnosed w/ L ovarian mass.  Pt states pain now radiating to L flank, constant. She has also been having intermittent vaginal bleeding for about 3 weeks.  On PE, pt tachycardic, uncomfortable, but in NAD.  During course of w/u, pt developed fever.  +LLQ ttp w/o rebound or guarding. +L adnexal ttp on pelvic exam w/ several large clots removed from vault. Labs show worsening WBC, UA infected. TVUS showed large complex cystic lesion w/ thick septation which had blood flow, concerning for possible neoplasm.  CT ab/pelvis also done which showed enlarging mass with surrounding stranding. Ddx including ovarian neoplasm, a sub serosal cystic degenerating fibroid, complex large ovarian cyst and tubo-ovarian abscess.  IV zosyn, tylenol, IVF given. OB/GYN consulted and accepted the pt for further treatment at [facility] hospital.           Shanna Cisco, MD 03/30/13 1123

## 2013-03-30 NOTE — H&P (Signed)
[facility] Faculty Practice Gynecology Attending History and Physical Note   Admission Date: 03/30/2013  History of Present Illness  Female Angelica Ortiz is an 47 y.o. [phone] morbidly obese  female who was admitted for management of 12 cm left adnexal tuboovarian abscess.  Patient has various other medical problems, please refer to problem list below.  She was initially seen in the ED on 03/17/13 for pain, CT scan showed a 10.8 cm left adnexal cystic mass.  A referral was made to the GYN clinic; she was scheduled to be on 04/11/12 by Dr. Erin Fulling.  However, she represented on 03/29/13 to [facility] ED with worsening pain and fevers to 102.4.  Repeat pelvic ultrasound showed 10.2 x 10.1 x 10.8 cm left adnexal complex cystic lesion with a 3 mm thick septation that raised possibility of possible neoplasm, normal arterial and venous waveforms, no evidence of torsion. CT scan was also repeated and showed a 12.5 cm x 11.2 cm (increased from prior CT scan on 03/17/13) left adnexal complex mass with peripheral enhancement and stranding around the mass which demonstrates central low attenuation. This raised concern about possible tuboovarian abscess, however abscess associated with diverticulitiswas considered less likely but not excluded given that the mass is insinuated around the sigmoid colon and patient has a history of diverticulitis.  CT scan also showed retroperitoneal periaortic adenopathy which may be reactive or neoplastic.  Given concern about her possibly enlarging pelvic mass and fevers, the leading diagnosis was of likely tuboovarian abscess and our service was consulted for further evaluation and management.  She was transferred to [facility] to be admitted for antibiotics, analgesia and further observation.  On admission, patient reports decreased but still significant LLQ pain.  No   Pertinent OB/GYN History:   [id number] One 36 week LTCS for severe preeclampsia, 2 SAB Patient's last menstrual  period was 03/16/2013. Last pap  was over  10 years ago.  Never had mammograms.  Denies any STIs.  Had AUB for several; years, had D&C x 3.    Patient Active Problem List   Diagnosis Date Noted  . Pelvic abscess in female 03/30/2013  . TOA (tubo-ovarian abscess) 03/30/2013  . Morbid obesity 03/30/2013  . Diabetes mellitus without complication   . Hypertension   . Lupus   . Diverticulitis     Past Medical History  Diagnosis Date  . Diverticulitis   . Asthma   . Diabetes mellitus without complication   . Hypertension   . Fibromyalgia   . Lupus   . GERD (gastroesophageal reflux disease)   . Overactive bladder   . Migraine   . Morbid obesity 03/30/2013  . Pulmonary embolism     Age 85    Past Surgical History  Procedure Laterality Date  . Cholecystectomy    . Appendectomy    . Cesarean section  2001  . Rotator cuff repair    . Laminectomy    . Orif ankle fracture Left   . Dilation and curettage of uterus      x 3 for AUB  . Laparoscopic ovarian cystectomy Right 1994    No family history on file.  Social History:  reports that she has never smoked. She does not have any smokeless tobacco history on file. She reports that she does not drink alcohol or use illicit drugs.  Allergies:  Allergies  Allergen Reactions  . Dilaudid [Hydromorphone Hcl] Hives and Itching  . Levaquin [Levofloxacin In  D5w] Shortness Of Breath and Itching    Medications: I have reviewed the patient's current medications. Prior to Admission:  Prescriptions prior to admission  Medication Sig Dispense Refill  . albuterol (PROVENTIL HFA;VENTOLIN HFA) 108 (90 BASE) MCG/ACT inhaler Inhale 2 puffs into the lungs 2 (two) times daily.      . budesonide-formoterol (SYMBICORT) 160-4.5 MCG/ACT inhaler Inhale 2 puffs into the lungs daily.      . fluticasone (FLONASE) 50 MCG/ACT nasal spray Place 2 sprays into both nostrils daily as needed for allergies or rhinitis.      Marland Kitchen glipiZIDE (GLUCOTROL) 10 MG  tablet Take 10 mg by mouth daily before breakfast.      . lisinopril-hydrochlorothiazide (PRINZIDE,ZESTORETIC) 20-12.5 MG per tablet Take 1 tablet by mouth daily.      Marland Kitchen omeprazole (PRILOSEC) 40 MG capsule Take 40 mg by mouth every evening.      Marland Kitchen oxybutynin (DITROPAN-XL) 10 MG 24 hr tablet Take 10 mg by mouth daily.      Marland Kitchen oxyCODONE-acetaminophen (PERCOCET/ROXICET) 5-325 MG per tablet Take 1 tablet by mouth every 12 (twelve) hours as needed for severe pain.      . pregabalin (LYRICA) 75 MG capsule Take 75 mg by mouth 2 (two) times daily.      . rizatriptan (MAXALT) 10 MG tablet Take 10 mg by mouth as needed for migraine (migraine). May repeat in 2 hours if needed      . sitaGLIPtin (JANUVIA) 25 MG tablet Take 25 mg by mouth daily.        Review of Systems: Pertinent items are noted in HPI.  Focused Physical Examination BP 121/56  Pulse 101  Temp(Src) 99.1 F (37.3 C) (Oral)  Resp 20  Ht 5\' 6"  (1.676 m)  Wt 374 lb (169.645 kg)  BMI 60.39 kg/m2  SpO2 98%  LMP 03/16/2013 On 03/17/13 Height 5'6" Weight 373 lbs BMI 60.4 GENERAL: Well-developed, morbidly obese, female in no acute distress.  LUNGS: Clear to auscultation bilaterally.  HEART: Regular rate and rhythm. ABDOMEN: Soft, obese, moderate LLQ tenderness, no rebound or guarding.  PELVIC: Deferred EXTREMITIES: No cyanosis, clubbing, or edema, 2+ distal pulses.  Results for orders placed during the hospital encounter of 03/29/13 (from the past 72 hour(s))  CBC WITH DIFFERENTIAL     Status: Abnormal   Collection Time    03/29/13  4:00 PM      Result Value Range   WBC 21.6 (*) 4.0 - 10.5 K/uL   RBC 4.47  3.87 - 5.11 MIL/uL   Hemoglobin 10.9 (*) 12.0 - 15.0 g/dL   HCT 54.0 (*) 98.1 - 19.1 %   MCV 74.3 (*) 78.0 - 100.0 fL   MCH 24.4 (*) 26.0 - 34.0 pg   MCHC 32.8  30.0 - 36.0 g/dL   RDW 47.8 (*) 29.5 - 62.1 %   Platelets 327  150 - 400 K/uL   Neutrophils Relative % 93 (*) 43 - 77 %   Neutro Abs 20.1 (*) 1.7 - 7.7 K/uL    Lymphocytes Relative 3 (*) 12 - 46 %   Lymphs Abs 0.7  0.7 - 4.0 K/uL   Monocytes Relative 4  3 - 12 %   Monocytes Absolute 0.8  0.1 - 1.0 K/uL   Eosinophils Relative 0  0 - 5 %   Eosinophils Absolute 0.0  0.0 - 0.7 K/uL   Basophils Relative 0  0 - 1 %   Basophils Absolute 0.0  0.0 - 0.1 K/uL  CBC  Status: Abnormal   Collection Time    03/29/13  5:30 PM      Result Value Range   WBC 20.2 (*) 4.0 - 10.5 K/uL   RBC 4.19  3.87 - 5.11 MIL/uL   Hemoglobin 10.1 (*) 12.0 - 15.0 g/dL   HCT 16.1 (*) 09.6 - 04.5 %   MCV 75.2 (*) 78.0 - 100.0 fL   MCH 24.1 (*) 26.0 - 34.0 pg   MCHC 32.1  30.0 - 36.0 g/dL   RDW 40.9 (*) 81.1 - 91.4 %   Platelets 323  150 - 400 K/uL  COMPREHENSIVE METABOLIC PANEL     Status: Abnormal   Collection Time    03/29/13  5:30 PM      Result Value Range   Sodium 130 (*) 135 - 145 mEq/L   Potassium 4.1  3.5 - 5.1 mEq/L   Chloride 93 (*) 96 - 112 mEq/L   CO2 22  19 - 32 mEq/L   Glucose, Bld 261 (*) 70 - 99 mg/dL   BUN 15  6 - 23 mg/dL   Creatinine, Ser 7.82  0.50 - 1.10 mg/dL   Calcium 8.9  8.4 - 95.6 mg/dL   Total Protein 7.9  6.0 - 8.3 g/dL   Albumin 2.7 (*) 3.5 - 5.2 g/dL   AST 14  0 - 37 U/L   ALT 11  0 - 35 U/L   Alkaline Phosphatase 99  39 - 117 U/L   Total Bilirubin 0.5  0.3 - 1.2 mg/dL   GFR calc non Af Amer 79 (*) >90 mL/min   GFR calc Af Amer >90  >90 mL/min   Comment: (NOTE)     The eGFR has been calculated using the CKD EPI equation.     This calculation has not been validated in all clinical situations.     eGFR's persistently <90 mL/min signify possible Chronic Kidney     Disease.  DIFFERENTIAL     Status: Abnormal   Collection Time    03/29/13  5:30 PM      Result Value Range   Neutrophils Relative % 94 (*) 43 - 77 %   Neutro Abs 19.0 (*) 1.7 - 7.7 K/uL   Lymphocytes Relative 3 (*) 12 - 46 %   Lymphs Abs 0.5 (*) 0.7 - 4.0 K/uL   Monocytes Relative 4  3 - 12 %   Monocytes Absolute 0.7  0.1 - 1.0 K/uL   Eosinophils Relative 0  0 - 5 %    Eosinophils Absolute 0.0  0.0 - 0.7 K/uL   Basophils Relative 0  0 - 1 %   Basophils Absolute 0.0  0.0 - 0.1 K/uL  LIPASE, BLOOD     Status: None   Collection Time    03/29/13  5:30 PM      Result Value Range   Lipase 19  11 - 59 U/L  URINALYSIS, ROUTINE W REFLEX MICROSCOPIC     Status: Abnormal   Collection Time    03/29/13  8:12 PM      Result Value Range   Color, Urine ORANGE (*) YELLOW   Comment: BIOCHEMICALS MAY BE AFFECTED BY COLOR   APPearance TURBID (*) CLEAR   Specific Gravity, Urine 1.030  1.005 - 1.030   pH 5.0  5.0 - 8.0   Glucose, UA 250 (*) NEGATIVE mg/dL   Hgb urine dipstick LARGE (*) NEGATIVE   Bilirubin Urine MODERATE (*) NEGATIVE   Ketones, ur NEGATIVE  NEGATIVE mg/dL  Protein, ur 30 (*) NEGATIVE mg/dL   Urobilinogen, UA 1.0  0.0 - 1.0 mg/dL   Nitrite POSITIVE (*) NEGATIVE   Leukocytes, UA SMALL (*) NEGATIVE  URINE MICROSCOPIC-ADD ON     Status: Abnormal   Collection Time    03/29/13  8:12 PM      Result Value Range   Squamous Epithelial / LPF RARE  RARE   WBC, UA 3-6  <3 WBC/hpf   RBC / HPF TOO NUMEROUS TO COUNT  <3 RBC/hpf   Bacteria, UA MANY (*) RARE   Casts GRANULAR CAST (*) NEGATIVE  POCT PREGNANCY, URINE     Status: None   Collection Time    03/29/13  8:17 PM      Result Value Range   Preg Test, Ur NEGATIVE  NEGATIVE   Comment:            THE SENSITIVITY OF THIS     METHODOLOGY IS >24 mIU/mL  WET PREP, GENITAL     Status: Abnormal   Collection Time    03/29/13 11:47 PM      Result Value Range   Yeast Wet Prep HPF POC NONE SEEN  NONE SEEN   Trich, Wet Prep NONE SEEN  NONE SEEN   Clue Cells Wet Prep HPF POC MODERATE (*) NONE SEEN   WBC, Wet Prep HPF POC MODERATE (*) NONE SEEN  CBC WITH DIFFERENTIAL     Status: Abnormal   Collection Time    03/30/13  6:40 AM      Result Value Range   WBC 13.6 (*) 4.0 - 10.5 K/uL   RBC 3.73 (*) 3.87 - 5.11 MIL/uL   Hemoglobin 8.9 (*) 12.0 - 15.0 g/dL   HCT 10.2 (*) 72.5 - 36.6 %   MCV 74.0 (*) 78.0 -  100.0 fL   MCH 23.9 (*) 26.0 - 34.0 pg   MCHC 32.2  30.0 - 36.0 g/dL   RDW 44.0 (*) 34.7 - 42.5 %   Platelets 259  150 - 400 K/uL   Neutrophils Relative % 84 (*) 43 - 77 %   Neutro Abs 11.4 (*) 1.7 - 7.7 K/uL   Lymphocytes Relative 7 (*) 12 - 46 %   Lymphs Abs 0.9  0.7 - 4.0 K/uL   Monocytes Relative 9  3 - 12 %   Monocytes Absolute 1.2 (*) 0.1 - 1.0 K/uL   Eosinophils Relative 0  0 - 5 %   Eosinophils Absolute 0.0  0.0 - 0.7 K/uL   Basophils Relative 0  0 - 1 %   Basophils Absolute 0.0  0.0 - 0.1 K/uL  BASIC METABOLIC PANEL     Status: Abnormal   Collection Time    03/30/13  6:40 AM      Result Value Range   Sodium 132 (*) 135 - 145 mEq/L   Potassium 3.8  3.5 - 5.1 mEq/L   Chloride 97  96 - 112 mEq/L   CO2 26  19 - 32 mEq/L   Glucose, Bld 219 (*) 70 - 99 mg/dL   BUN 11  6 - 23 mg/dL   Creatinine, Ser 9.56  0.50 - 1.10 mg/dL   Calcium 8.0 (*) 8.4 - 10.5 mg/dL   GFR calc non Af Amer >90  >90 mL/min   GFR calc Af Amer >90  >90 mL/min   Comment: (NOTE)     The eGFR has been calculated using the CKD EPI equation.     This calculation has not been  validated in all clinical situations.     eGFR's persistently <90 mL/min signify possible Chronic Kidney     Disease.     Imaging:  03/29/2013   TRANSABDOMINAL AND TRANSVAGINAL ULTRASOUND OF PELVIS  DOPPLER ULTRASOUND OF OVARIES CLINICAL DATA:  Left lower quadrant pain. Known left ovarian mass. Elevated white blood cell count. TECHNIQUE: Both transabdominal and transvaginal ultrasound examinations of the pelvis were performed. Transabdominal technique was performed for global imaging of the pelvis including uterus, ovaries, adnexal regions, and pelvic cul-de-sac.  It was necessary to proceed with endovaginal exam following the transabdominal exam to visualize the ovaries. Color and duplex Doppler ultrasound was utilized to evaluate blood flow to the ovaries.  COMPARISON:  CT 03/18/2013.  Marland Kitchen  FINDINGS: Uterus  Measurements: 13.9 cm x 5.6 min  cm by 6.4 cm. No fibroids or other mass visualized.  Endometrium  Thickness: 10 mm.  No focal abnormality visualized.  Right ovary  Not visualized.  Left ovary  Measurements: 10.2 x 10.1 x 10.8 cm. Complex cystic lesion is present with thick septation. The lesion appears predominantly anechoic. The septation measures 3 mm in thickness. Internal flow is present (image 35). Blood flow is present in the septation. Allowing for technical differences, this appears little changed compared to 03/18/2013. There is no ovarian torsion. Evaluation is technically degraded by body habitus.  Pulsed Doppler evaluation of both ovaries demonstrates normal low-resistance arterial and venous waveforms.  Other findings  No free fluid.  IMPRESSION: 1. No acute abnormality.  Negative for ovarian torsion. 2. Unchanged left ovarian cystic mass measuring 10.8 cm long axis. 3 mm thick septation with internal vascular flow raises the possibility of ovarian neoplasm and followup gynecologic consultation is recommended. 3. Nonvisualization of the right ovary.   Electronically Signed   By: Andreas Newport M.D.   On: 03/29/2013 20:06   03/29/2013   CT ABDOMEN AND PELVIS WITH CONTRAST  CLINICAL DATA:  Left lower quadrant abdominal pain. Nausea, vomiting, and diarrhea.  TECHNIQUE: Multidetector CT imaging of the abdomen and pelvis was performed using the standard protocol following bolus administration of intravenous contrast.  CONTRAST:  50mL OMNIPAQUE IOHEXOL 300 MG/ML SOLN, OMNIPAQUE IOHEXOL 300 MG/ML SOLN  COMPARISON:  Ultrasound 03/29/2013.  CT 03/18/2013.  FINDINGS: Lung Bases: Dependent atelectasis.  Liver: Mild hepatomegaly with liver span of 19.6 cm. Rounding of the inferior right hepatic lobe.  Spleen:  Normal.  Gallbladder:  Surgically absent.  Common bile duct:  Normal.  Pancreas:  Fatty atrophy of the uncinate.  Adrenal glands:  Normal.  Kidneys: Normal enhancement and delayed excretion of contrast. The ureters appear within  normal limits. There is no hydronephrosis. No calculi.  Stomach:  Partially collapsed.  Tiny hiatal hernia.  Small bowel: Proximal small bowel appears normal. There is no small bowel obstruction. Contrast is present in the jejunum. No contrast in the ileum.  Colon: Appendix not identified. Proximal colon appears normal. Descending colonic diverticulosis.  Pelvic Genitourinary: Septated mass is present in the anatomic pelvis. Compared to the prior CT exam, this has increased in size, now measuring 12.5 cm x 11.2 cm. Peripheral enhancement is present. There is stranding around the mass which demonstrates central low attenuation. The mass is apposed to the left uterine fundus. This is insinuated around the sigmoid colon. Mild fat stranding is present around the mass. Craniocaudal measurement of the mass is 9.5 cm. Urinary bladder is decompressed.  Enlarged retroperitoneal lymph nodes are present. Periaortic node measures 14 mm short axis. Left external  iliac node measures 13 mm short axis. Smaller nodes are present along the pelvic side walls.  Bones: Lumbar spondylosis and facet arthrosis. No aggressive osseous lesions. Bilateral SI joint degenerative disease. Small focus of left femoral head AVN. Thoracic spine DISH.  Vasculature: No acute vascular abnormality.  Body Wall: Periumbilical scarring.  IMPRESSION: 1. Enlarging pelvic mass to the left of the uterus. The differential considerations include adnexal mass including ovarian neoplasm, a sub serosal cystic degenerating fibroid, or complex large ovarian cyst. Tubo-ovarian abscess is in the differential considerations, particularly with stranding around the mass. Abscess associated with diverticulitis is considered less likely but not excluded Gynecologic consultation recommended. 2. Retroperitoneal periaortic adenopathy which may be reactive or neoplastic. 3. Cholecystectomy.   Electronically Signed   By: Andreas Newport M.D.   On: 03/29/2013 22:26   03/17/2013  CT ABDOMEN AND PELVIS WITH CONTRAST  CLINICAL DATA: Abdominal pain, nausea, vomiting and diarrhea. TECHNIQUE: Multidetector CT imaging of the abdomen and pelvis was performed using the standard protocol following bolus administration of intravenous contrast. CONTRAST: OMNIPAQUE IOHEXOL 300 MG/ML SOLN COMPARISON: None. FINDINGS: The visualized lung bases are clear. The liver and spleen are unremarkable in appearance. The patient is status post cholecystectomy, with clips noted along the gallbladder fossa. The pancreas and adrenal glands are unremarkable. The kidneys are unremarkable in appearance. There is no evidence of hydronephrosis. No renal or ureteral stones are seen. No perinephric stranding is appreciated. No free fluid is identified. The small bowel is unremarkable in appearance. The stomach is within normal limits. No acute vascular abnormalities are seen. The patient is status post appendectomy. The colon is largely decompressed; minimal diverticulosis is noted along the distal descending colon. The bladder is largely decompressed and grossly unremarkable in appearance. The uterus is grossly unremarkable in appearance. A large 10.8 cm cystic mass is noted at the left adnexa. This could be further assessed on pelvic ultrasound. The right ovary is unremarkable in appearance. No inguinal lymphadenopathy is seen. No acute osseous abnormalities are identified. IMPRESSION: 1. Large left adnexal cystic mass noted, measuring 10.8 cm. Pelvic ultrasound would be helpful for further evaluation, when and as deemed clinically appropriate. 2. Minimal diverticulosis noted along the distal descending colon, without evidence of diverticulitis. Electronically Signed By: Roanna Raider M.D.   Assessment/Plan: Angelica Ortiz is a 47 y.o. [phone] with a  12 cm left adnexal complex, cystic pelvic mass and fevers, likely a TOA but cannot exclude possibility of diverticulitis-related abscess - Primaxin 500 mg IV  q6h ordered after Pharmacy Consultation.  This medication is excellent monotherapy for complicated TOA or diverticular abscess. It will also treat her UTI and BV, based on organism susceptibilities to this medication. Last fever was 101.6 around 0100, patient understands that she will need to be on IV antibiotics until she is afebrile for 48 hours . - Blood and urine cultures, other labs pending. Will follow up results and manage accordingly. - Home medications reordered for various comorbidities, may need Internal Medicine Consultation if any acute issues arise - In case patient needs surgery, General Surgery may be consulted given possible bowel involvement - Given concern about possible neoplasm, will check CA-125 level; also GYN Oncology may need to be consulted if surgery is needed - Analgesia as needed - Given history of PE, pelvic infection and morbid obesity, Lovenox ordered for PE/DVT prophylaxis. Also encouraged OOB, ambulation for DVT prophylaxis.   - Routine floor care    Jaynie Collins, MD, [facility]  Attending Obstetrician &  Counselling psychologist, [facility]

## 2013-03-30 NOTE — ED Notes (Signed)
Pt going to room 303

## 2013-03-30 NOTE — Progress Notes (Signed)
Inpatient Diabetes Program Recommendations  AACE/ADA: New Consensus Statement on Inpatient Glycemic Control (2013)  Target Ranges:  Prepandial:   less than 140 mg/dL      Peak postprandial:   less than 180 mg/dL (1-2 hours)      Critically ill patients:  140 - 180 mg/dL   Results for SURA, CANUL (MRN [id number]) as of 03/30/2013 12:29  Ref. Range 03/29/2013 17:30 03/30/2013 06:40  Glucose Latest Range: 70-99 mg/dL 409 (H) 811 (H)   Results for KIZZI, OVERBEY (MRN [id number]) as of 03/30/2013 12:29  Ref. Range 03/30/2013 10:34  Glucose-Capillary Latest Range: 70-99 mg/dL 213 (H)     Inpatient Diabetes Program Recommendations Correction (SSI): While inpatient, please consider ordering Glycemic Control order set with CBGs ACHS with Novolog moderate correction scale.  If patient is not eating well, may want to order CBGs and Novolog correction Q4H.  Thanks, Orlando Penner, RN, MSN, CCRN Diabetes Coordinator Inpatient Diabetes Program [phone] (Team Pager) [phone] ([facility] office) [phone] [facility] office)

## 2013-03-30 NOTE — Progress Notes (Signed)
ANTICOAGULATION CONSULT NOTE - Initial Consult  Pharmacy Consult for: Lovenox Indication: VTE prophylaxis, h/o PE  Allergies  Allergen Reactions  . Dilaudid [Hydromorphone Hcl] Hives and Itching  . Levaquin [Levofloxacin In D5w] Shortness Of Breath and Itching    Patient Measurements: Height: 5\' 6"  (167.6 cm) Weight: 374 lb (169.645 kg) IBW/kg (Calculated) : 59.3 Heparin Dosing Weight:  Vital Signs: Temp: 99.1 F (37.3 C) (12/24 0540) Temp src: Oral (12/24 0540) BP: 121/56 mmHg (12/24 0540) Pulse Rate: 101 (12/24 0540)  Labs:  Recent Labs  03/29/13 1600 03/29/13 1730 03/30/13 0640  HGB 10.9* 10.1* 8.9*  HCT 33.2* 31.5* 27.6*  PLT 327 323 259  CREATININE  --  0.87 0.66    Estimated Creatinine Clearance: 145 ml/min (by C-G formula based on Cr of 0.66).   Medical History: Past Medical History  Diagnosis Date  . Diverticulitis   . Asthma   . Diabetes mellitus without complication   . Hypertension   . Fibromyalgia   . Lupus   . GERD (gastroesophageal reflux disease)   . Overactive bladder   . Migraine   . Morbid obesity 03/30/2013  . Pulmonary embolism     Age 48     Assessment: Pt. currently being treated for TOA with some vaginal bleeding. Has h/o of PE which occurred when she was 47 years old, but this was a single episode and she is not on maintenance anticoagulation . She has good renal clearance CrCl= 138ml/min. Morbid obesity BMI >30 will warrant 0.5mg /kg dosing.    Goal of Therapy: therapeutic heparin goal = 0.3-0.6 units/ml    Plan: 1) Start Lovenox 80mg  SQ Q 24 hr this am. 2) CBC ordered daily. 3) Monitor labs and s/s of bleeding   Hovey-Rankin, Adithi Gammon 03/30/2013,9:30 AM

## 2013-03-31 LAB — CBC
HCT: 27.1 % — ABNORMAL LOW (ref 36.0–46.0)
Hemoglobin: 8.7 g/dL — ABNORMAL LOW (ref 12.0–15.0)
MCH: 23.8 pg — ABNORMAL LOW (ref 26.0–34.0)
MCV: 74.2 fL — ABNORMAL LOW (ref 78.0–100.0)
Platelets: 267 10*3/uL (ref 150–400)
RBC: 3.65 MIL/uL — ABNORMAL LOW (ref 3.87–5.11)
RDW: 16.3 % — ABNORMAL HIGH (ref 11.5–15.5)
WBC: 16.7 10*3/uL — ABNORMAL HIGH (ref 4.0–10.5)

## 2013-03-31 LAB — URINE CULTURE: Colony Count: 25000

## 2013-03-31 LAB — GLUCOSE, CAPILLARY
Glucose-Capillary: 126 mg/dL — ABNORMAL HIGH (ref 70–99)
Glucose-Capillary: 210 mg/dL — ABNORMAL HIGH (ref 70–99)

## 2013-03-31 MED ORDER — LOPERAMIDE HCL 2 MG PO CAPS
2.0000 mg | ORAL_CAPSULE | ORAL | Status: DC | PRN
  Filled 2013-03-31: qty 1

## 2013-03-31 NOTE — Progress Notes (Signed)
Dr. Prior notified of temp 102.5, pulse 128.  No new orders.

## 2013-03-31 NOTE — Progress Notes (Signed)
Subjective: Patient reports tolerating PO and no problems voiding.  Pt reports that her pain is improved over admission.  She still c/o pain in her left lower quad.  The pain is worse with movement.  She denies n/v.   She reports that her glucose has not been well controlled at home.  She was prev on metformin but, it gave her diarrhea.  Objective: BP 104/72  Pulse 101  Temp(Src) 98.4 F (36.9 C) (Oral)  Resp 22  Ht 5\' 6"  (1.676 m)  Wt 374 lb (169.645 kg)  BMI 60.39 kg/m2  SpO2 96%  LMP 03/16/2013 Tmax 101.9 I have reviewed patient's vital signs, intake and output, medications, labs, microbiology and radiology results.  General: alert, appears older than stated age and mild distress Resp: clear to auscultation bilaterally Cardio: regular rate and rhythm, S1, S2 normal, no murmur, click, rub or gallop GI: normal findings: bowel sounds normal and abnormal findings:  obese; LLQ tenderness- mild.  No rebound or guarding. Extremities: extremities normal, atraumatic, no cyanosis or edema  CBC    Component Value Date/Time   WBC 16.7* 03/31/2013 0504   RBC 3.65* 03/31/2013 0504   HGB 8.7* 03/31/2013 0504   HCT 27.1* 03/31/2013 0504   PLT 267 03/31/2013 0504   MCV 74.2* 03/31/2013 0504   MCH 23.8* 03/31/2013 0504   MCHC 32.1 03/31/2013 0504   RDW 16.3* 03/31/2013 0504   LYMPHSABS 0.9 03/30/2013 0640   MONOABS 1.2* 03/30/2013 0640   EOSABS 0.0 03/30/2013 0640   BASOSABS 0.0 03/30/2013 0640   FSG 200's yesterday.  This am 126 HgbA1c >10  Assessment/Plan: Currently on day #2 of IV ATBX. FSG elevated. Need to control glucose to assist with recovery and tx of abscess:   Glipizide increased from 5mg  to 10mg  Glucophage added- will give Loperamide if diarrhea Discharge to home when afebrile >48ours.   LOS: 2 days    HARRAWAY-SMITH, Nikeshia Keetch 03/31/2013, 7:36 AM

## 2013-04-01 ENCOUNTER — Ambulatory Visit (HOSPITAL_COMMUNITY): Payer: Medicare Other

## 2013-04-01 ENCOUNTER — Encounter (HOSPITAL_COMMUNITY): Payer: Self-pay | Admitting: Radiology

## 2013-04-01 LAB — CBC WITH DIFFERENTIAL/PLATELET
Basophils Relative: 0 % (ref 0–1)
Eosinophils Absolute: 0 10*3/uL (ref 0.0–0.7)
HCT: 24.3 % — ABNORMAL LOW (ref 36.0–46.0)
Hemoglobin: 7.8 g/dL — ABNORMAL LOW (ref 12.0–15.0)
Lymphs Abs: 1.3 10*3/uL (ref 0.7–4.0)
MCH: 23.7 pg — ABNORMAL LOW (ref 26.0–34.0)
MCHC: 32.1 g/dL (ref 30.0–36.0)
MCV: 73.9 fL — ABNORMAL LOW (ref 78.0–100.0)
Monocytes Absolute: 1 10*3/uL (ref 0.1–1.0)
Monocytes Relative: 7 % (ref 3–12)
Neutrophils Relative %: 84 % — ABNORMAL HIGH (ref 43–77)
RBC: 3.29 MIL/uL — ABNORMAL LOW (ref 3.87–5.11)

## 2013-04-01 LAB — GLUCOSE, CAPILLARY
Glucose-Capillary: 176 mg/dL — ABNORMAL HIGH (ref 70–99)
Glucose-Capillary: 142 mg/dL — ABNORMAL HIGH (ref 70–99)
Glucose-Capillary: 112 mg/dL — ABNORMAL HIGH (ref 70–99)
Glucose-Capillary: 121 mg/dL — ABNORMAL HIGH (ref 70–99)

## 2013-04-01 LAB — PROTIME-INR: INR: 1.22 (ref 0.00–1.49)

## 2013-04-01 MED ORDER — FENTANYL CITRATE 0.05 MG/ML IJ SOLN
INTRAMUSCULAR | Status: AC | PRN
  Administered 2013-04-01: 50 ug via INTRAVENOUS
  Administered 2013-04-01 (×2): 25 ug via INTRAVENOUS

## 2013-04-01 MED ORDER — MIDAZOLAM HCL 2 MG/2ML IJ SOLN
INTRAMUSCULAR | Status: AC | PRN
  Administered 2013-04-01 (×3): 1 mg via INTRAVENOUS

## 2013-04-01 NOTE — Progress Notes (Addendum)
Subjective: Patient reports tolerating PO and no problems voiding.  No N/V/F/C.  She ate yogurt and fruit for breakfast.  Pain improving.  Objective: I have reviewed patient's vital signs, medications, labs, microbiology and radiology results.  General: alert and no distress done earlier by Dr. Despina Hidden  CBC    Component Value Date/Time   WBC 14.3* 04/01/2013 0937   RBC 3.29* 04/01/2013 0937   HGB 7.8* 04/01/2013 0937   HCT 24.3* 04/01/2013 0937   PLT 276 04/01/2013 0937   MCV 73.9* 04/01/2013 0937   MCH 23.7* 04/01/2013 0937   MCHC 32.1 04/01/2013 0937   RDW 16.2* 04/01/2013 0937   LYMPHSABS 1.3 04/01/2013 0937   MONOABS 1.0 04/01/2013 0937   EOSABS 0.0 04/01/2013 0937   BASOSABS 0.0 04/01/2013 0937   Urine and blood cx neg for growth (from 12/23) PT/INR/PTT- WNL  Assessment/Plan: Pelvic abscess s/p 3 day of IV atbx.  Pt improving gradually.  Pt is a poor surgical risk due to obesity, co-morbidities (uncontrolled DM) and multiple previous surgeries.  d/w Dr. Orest Dikes who has agree to attempt percutaneous drainage.  Pt to be transferred to [facility] for Int Radiology.  Reviewed above with pt Lab: CBC with coags STAT Hold Lovenox until after procedure NPO      LOS: 3 days    HARRAWAY-SMITH, Con Arganbright 04/01/2013, 9:37 AM

## 2013-04-01 NOTE — Consult Note (Signed)
HPI: Angelica Ortiz is an 47 y.o. female admitted to [facility] hospital with presumed tubovarian abscess. She has not responded to IV abx and IR is asked to perc drain abscess as she is high risk for operative intervention due to morbid obesity and diabetes. Chart, PMHx, meds reviewed.  Past Medical History:  Past Medical History  Diagnosis Date  . Diverticulitis   . Asthma   . Diabetes mellitus without complication   . Hypertension   . Fibromyalgia   . Lupus   . GERD (gastroesophageal reflux disease)   . Overactive bladder   . Migraine   . Morbid obesity 03/30/2013  . Pulmonary embolism     Age 29    Past Surgical History:  Past Surgical History  Procedure Laterality Date  . Cholecystectomy    . Appendectomy    . Cesarean section  2001  . Rotator cuff repair    . Laminectomy    . Orif ankle fracture Left   . Dilation and curettage of uterus      x 3 for AUB  . Laparoscopic ovarian cystectomy Right 1994    Family History: No family history on file.  Social History:  reports that she has never smoked. She does not have any smokeless tobacco history on file. She reports that she does not drink alcohol or use illicit drugs.  Allergies:  Allergies  Allergen Reactions  . Dilaudid [Hydromorphone Hcl] Hives and Itching  . Levaquin [Levofloxacin In D5w] Shortness Of Breath and Itching    Medications:   Medication List    ASK your doctor about these medications       albuterol 108 (90 BASE) MCG/ACT inhaler  Commonly known as:  PROVENTIL HFA;VENTOLIN HFA  Inhale 2 puffs into the lungs 2 (two) times daily.     budesonide-formoterol 160-4.5 MCG/ACT inhaler  Commonly known as:  SYMBICORT  Inhale 2 puffs into the lungs daily.     fluticasone 50 MCG/ACT nasal spray  Commonly known as:  FLONASE  Place 2 sprays into both nostrils daily as needed for allergies or rhinitis.     glipiZIDE 10 MG tablet  Commonly known as:  GLUCOTROL  Take 10 mg by mouth daily before  breakfast.     lisinopril-hydrochlorothiazide 20-12.5 MG per tablet  Commonly known as:  PRINZIDE,ZESTORETIC  Take 1 tablet by mouth daily.     omeprazole 40 MG capsule  Commonly known as:  PRILOSEC  Take 40 mg by mouth every evening.     oxybutynin 10 MG 24 hr tablet  Commonly known as:  DITROPAN-XL  Take 10 mg by mouth daily.     oxyCODONE-acetaminophen 5-325 MG per tablet  Commonly known as:  PERCOCET/ROXICET  Take 1 tablet by mouth every 12 (twelve) hours as needed for severe pain.     pregabalin 75 MG capsule  Commonly known as:  LYRICA  Take 75 mg by mouth 2 (two) times daily.     rizatriptan 10 MG tablet  Commonly known as:  MAXALT  Take 10 mg by mouth as needed for migraine (migraine). May repeat in 2 hours if needed     sitaGLIPtin 25 MG tablet  Commonly known as:  JANUVIA  Take 25 mg by mouth daily.        Please HPI for pertinent positives, otherwise complete 10 system ROS negative.  Physical Exam: BP 114/68  Pulse 91  Temp(Src) 98.5 F (36.9 C) (Oral)  Resp 16  Ht 5\' 6"  (1.676 m)  Wt  374 lb (169.645 kg)  BMI 60.39 kg/m2  SpO2 98%  LMP 03/16/2013 Body mass index is 60.39 kg/(m^2).   General Appearance:  Alert, cooperative morbidly obese AA female  Head:  Normocephalic, without obvious abnormality, atraumatic  ENT: Unremarkable  Neck: Supple, symmetrical, trachea midline  Lungs:   Clear to auscultation bilaterally, no w/r/r, respirations unlabored without use of accessory muscles.  Chest Wall:  No tenderness or deformity  Heart:  Regular rate and rhythm, S1, S2 normal, no murmur, rub or gallop.  Abdomen:   Soft, tender low abd  Neurologic: Normal affect, no gross deficits.   Results for orders placed during the hospital encounter of 03/29/13 (from the past 48 hour(s))  GLUCOSE, CAPILLARY     Status: Abnormal   Collection Time    03/30/13  6:32 PM      Result Value Range   Glucose-Capillary 168 (*) 70 - 99 mg/dL  GLUCOSE, CAPILLARY     Status:  Abnormal   Collection Time    03/30/13  9:58 PM      Result Value Range   Glucose-Capillary 161 (*) 70 - 99 mg/dL   Comment 1 Documented in Chart    CBC     Status: Abnormal   Collection Time    03/31/13  5:04 AM      Result Value Range   WBC 16.7 (*) 4.0 - 10.5 K/uL   RBC 3.65 (*) 3.87 - 5.11 MIL/uL   Hemoglobin 8.7 (*) 12.0 - 15.0 g/dL   HCT 82.9 (*) 56.2 - 13.0 %   MCV 74.2 (*) 78.0 - 100.0 fL   MCH 23.8 (*) 26.0 - 34.0 pg   MCHC 32.1  30.0 - 36.0 g/dL   RDW 86.5 (*) 78.4 - 69.6 %   Platelets 267  150 - 400 K/uL  GLUCOSE, CAPILLARY     Status: Abnormal   Collection Time    03/31/13  7:34 AM      Result Value Range   Glucose-Capillary 126 (*) 70 - 99 mg/dL  GLUCOSE, CAPILLARY     Status: Abnormal   Collection Time    03/31/13 12:44 PM      Result Value Range   Glucose-Capillary 199 (*) 70 - 99 mg/dL  GLUCOSE, CAPILLARY     Status: Abnormal   Collection Time    03/31/13 12:46 PM      Result Value Range   Glucose-Capillary 210 (*) 70 - 99 mg/dL  GLUCOSE, CAPILLARY     Status: Abnormal   Collection Time    03/31/13  5:25 PM      Result Value Range   Glucose-Capillary 134 (*) 70 - 99 mg/dL  GLUCOSE, CAPILLARY     Status: Abnormal   Collection Time    03/31/13 10:11 PM      Result Value Range   Glucose-Capillary 181 (*) 70 - 99 mg/dL  GLUCOSE, CAPILLARY     Status: Abnormal   Collection Time    04/01/13  7:48 AM      Result Value Range   Glucose-Capillary 176 (*) 70 - 99 mg/dL  CBC WITH DIFFERENTIAL     Status: Abnormal   Collection Time    04/01/13  9:37 AM      Result Value Range   WBC 14.3 (*) 4.0 - 10.5 K/uL   RBC 3.29 (*) 3.87 - 5.11 MIL/uL   Hemoglobin 7.8 (*) 12.0 - 15.0 g/dL   HCT 29.5 (*) 28.4 - 13.2 %   MCV 73.9 (*)  78.0 - 100.0 fL   MCH 23.7 (*) 26.0 - 34.0 pg   MCHC 32.1  30.0 - 36.0 g/dL   RDW 96.0 (*) 45.4 - 09.8 %   Platelets 276  150 - 400 K/uL   Neutrophils Relative % 84 (*) 43 - 77 %   Neutro Abs 12.0 (*) 1.7 - 7.7 K/uL   Lymphocytes  Relative 9 (*) 12 - 46 %   Lymphs Abs 1.3  0.7 - 4.0 K/uL   Monocytes Relative 7  3 - 12 %   Monocytes Absolute 1.0  0.1 - 1.0 K/uL   Eosinophils Relative 0  0 - 5 %   Eosinophils Absolute 0.0  0.0 - 0.7 K/uL   Basophils Relative 0  0 - 1 %   Basophils Absolute 0.0  0.0 - 0.1 K/uL  PROTIME-INR     Status: None   Collection Time    04/01/13  9:37 AM      Result Value Range   Prothrombin Time 15.1  11.6 - 15.2 seconds   INR 1.22  0.00 - 1.49  APTT     Status: Abnormal   Collection Time    04/01/13  9:37 AM      Result Value Range   aPTT 39 (*) 24 - 37 seconds   Comment:            IF BASELINE aPTT IS ELEVATED,     SUGGEST PATIENT RISK ASSESSMENT     BE USED TO DETERMINE APPROPRIATE     ANTICOAGULANT THERAPY.  GLUCOSE, CAPILLARY     Status: Abnormal   Collection Time    04/01/13 12:26 PM      Result Value Range   Glucose-Capillary 142 (*) 70 - 99 mg/dL   No results found.  Assessment/Plan Pelvic abscess, probable TOA For CT guided drainage. Explained procedure to pt, including risks, complications, use of sedation. Labs reviewed, ok Consent signed in chart  Brayton El PA-C 04/01/2013, 1:28 PM

## 2013-04-01 NOTE — Progress Notes (Signed)
Drain flushed with 5cc normal saline, tolerated well.  Long, string clot pulled out of drain tubing.  Drainage serosanguineous.  Dressing changed, site cleansed, Tegaderm applied.

## 2013-04-01 NOTE — ED Notes (Signed)
Moaning in pain Morphine IV as ordered ice pack applied to incisional site. Offer to change position.

## 2013-04-01 NOTE — Progress Notes (Signed)
Diabetes mellitus without complication [250.00] Diverticulitis [562.11] Pelvic mass [789.30] Hypertension [401.9] Lupus [710.0] Ovarian mass [620.9] TOA (tubo-ovarian abscess) [614.2] Morbid obesity with BMI of 60.0-69.9, adult [278.01, V85.44]  Subjective: Patient reports pain about the same.    Objective: Filed Vitals:   03/31/13 1400 03/31/13 1841 03/31/13 2215 04/01/13 0533  BP: 109/59 89/56 111/61 91/59  Pulse: 101 100 98 110  Temp: 98.1 F (36.7 C) 98.2 F (36.8 C) 98.6 F (37 C) 99.8 F (37.7 C)  TempSrc: Oral Oral Oral Oral  Resp: 20 20 20 18   Height:      Weight:      SpO2: 97% 97% 100% 100%    I have reviewed patient's vital signs, intake and output, medications and labs.  General:  Mild to moderate distress,  Abdomen:  Severe tenderness left lower quadrant, +/- rebound    Medication List    ASK your doctor about these medications       albuterol 108 (90 BASE) MCG/ACT inhaler  Commonly known as:  PROVENTIL HFA;VENTOLIN HFA  Inhale 2 puffs into the lungs 2 (two) times daily.     budesonide-formoterol 160-4.5 MCG/ACT inhaler  Commonly known as:  SYMBICORT  Inhale 2 puffs into the lungs daily.     fluticasone 50 MCG/ACT nasal spray  Commonly known as:  FLONASE  Place 2 sprays into both nostrils daily as needed for allergies or rhinitis.     glipiZIDE 10 MG tablet  Commonly known as:  GLUCOTROL  Take 10 mg by mouth daily before breakfast.     lisinopril-hydrochlorothiazide 20-12.5 MG per tablet  Commonly known as:  PRINZIDE,ZESTORETIC  Take 1 tablet by mouth daily.     omeprazole 40 MG capsule  Commonly known as:  PRILOSEC  Take 40 mg by mouth every evening.     oxybutynin 10 MG 24 hr tablet  Commonly known as:  DITROPAN-XL  Take 10 mg by mouth daily.     oxyCODONE-acetaminophen 5-325 MG per tablet  Commonly known as:  PERCOCET/ROXICET  Take 1 tablet by mouth every 12 (twelve) hours as needed for severe pain.     pregabalin 75 MG capsule   Commonly known as:  LYRICA  Take 75 mg by mouth 2 (two) times daily.     rizatriptan 10 MG tablet  Commonly known as:  MAXALT  Take 10 mg by mouth as needed for migraine (migraine). May repeat in 2 hours if needed     sitaGLIPtin 25 MG tablet  Commonly known as:  JANUVIA  Take 25 mg by mouth daily.        Assessment/Plan: Diabetes mellitus without complication [250.00] Diverticulitis [562.11] Pelvic mass [789.30] Hypertension [401.9] Lupus [710.0] Ovarian mass [620.9] TOA (tubo-ovarian abscess) [614.2] Morbid obesity with BMI of 60.0-69.9, adult [278.01, V85.44]   Continue primaxin and evaluate clinical response If does not continue to improve will need to weigh options of drainage, still uncertain if is TOA or a diverticular abscess secondarily involving the adnexa etc Pt is frustrated but understands this is a long slow process  LOS: 3 days    Angelica Ortiz,Angelica Ortiz 04/01/2013, 7:30 AM

## 2013-04-01 NOTE — Procedures (Signed)
12 Fr abscess drain Pus No comp

## 2013-04-01 NOTE — Progress Notes (Signed)
Enc pt to ambulate in hall x3 during morning shift.  Reviewed c pt and family the importance/ health benefits of ambulation and pt agreed, said she would and then did not.  Prior to tx to [facility] for procedure I requested again that the pt walk in the hall, pt stated she would 'try'.  And then did not.  Cristie Hem RN

## 2013-04-02 LAB — CBC
Hemoglobin: 7.6 g/dL — ABNORMAL LOW (ref 12.0–15.0)
MCV: 73.6 fL — ABNORMAL LOW (ref 78.0–100.0)
Platelets: 293 10*3/uL (ref 150–400)
RBC: 3.26 MIL/uL — ABNORMAL LOW (ref 3.87–5.11)
RDW: 16.4 % — ABNORMAL HIGH (ref 11.5–15.5)
WBC: 10.4 10*3/uL (ref 4.0–10.5)

## 2013-04-02 LAB — GLUCOSE, CAPILLARY
Glucose-Capillary: 99 mg/dL (ref 70–99)
Glucose-Capillary: 104 mg/dL — ABNORMAL HIGH (ref 70–99)
Glucose-Capillary: 79 mg/dL (ref 70–99)

## 2013-04-02 MED ORDER — METRONIDAZOLE 500 MG PO TABS
500.0000 mg | ORAL_TABLET | Freq: Three times a day (TID) | ORAL | Status: DC
  Administered 2013-04-02 – 2013-04-04 (×6): 500 mg via ORAL
  Filled 2013-04-02 (×9): qty 1

## 2013-04-02 MED ORDER — CIPROFLOXACIN HCL 500 MG PO TABS
500.0000 mg | ORAL_TABLET | Freq: Two times a day (BID) | ORAL | Status: DC
  Administered 2013-04-02: 500 mg via ORAL
  Filled 2013-04-02 (×2): qty 1

## 2013-04-02 NOTE — Progress Notes (Signed)
Encouraged patient to ambulate in hallway after pain administration.  Patient reports pain medication was effective.  Patient on phone and stated to writer "I'm really tired and going to rest.  I'll walk when I get up."  Discussed the importance of ambulating and her history of PE.  Will continue to monitor.  Osvaldo Angst, RN----------------

## 2013-04-02 NOTE — Progress Notes (Signed)
Informed patient that together we are to ambulate in the hall every four hours per the doctors order.  Patient now reports pain.  Pain medications provided.  Will continue to monitor.  Osvaldo Angst, RN-------------------

## 2013-04-02 NOTE — Progress Notes (Signed)
Patient encouraged to ambulate in the hallways every after bathroom use.  Also offered to assist with ambulation before bedtime.

## 2013-04-02 NOTE — Progress Notes (Signed)
Subjective: Pt s/p transfer to Interventional radiology at [facility] for perc drain yesterday.  Total output was ~600cc.  Patient reports tolerating PO and no problems voiding.  Her pain is improved since admission.    Objective: I have reviewed patient's vital signs, intake and output, medications, labs, microbiology and radiology results.  General: alert and no distress Resp: clear to auscultation bilaterally Cardio: regular rate and rhythm, S1, S2 normal, no murmur, click, rub or gallop Extremities: extremities normal, atraumatic, no cyanosis or edema Abd: drain noted in RLL; site sore but, no erythema or warmth CBC    Component Value Date/Time   WBC 10.4 04/02/2013 0510   RBC 3.26* 04/02/2013 0510   HGB 7.6* 04/02/2013 0510   HCT 24.0* 04/02/2013 0510   PLT 293 04/02/2013 0510   MCV 73.6* 04/02/2013 0510   MCH 23.3* 04/02/2013 0510   MCHC 31.7 04/02/2013 0510   RDW 16.4* 04/02/2013 0510   LYMPHSABS 1.3 04/01/2013 0937   MONOABS 1.0 04/01/2013 0937   EOSABS 0.0 04/01/2013 0937   BASOSABS 0.0 04/01/2013 0937   Drain output 600cc total since placement.  50cc in the last 8+hours.   Assessment/Plan: TOA- s/p perc drainage Pt improving clinically GLC improved with change in meds  Keep IV atbx until afebrile >48 hours then change to po atbx for 2 weeks total    LOS: 4 days    HARRAWAY-SMITH, Graiden Henes 04/02/2013, 7:35 AM

## 2013-04-02 NOTE — Progress Notes (Signed)
Patient reports pain decreased.  Ambulating in hall with family member.  Encouragement provided to patient.  Asked patient when she would like to shower and she stated "Not now I will let you know".   Will continue to monitor.  Osvaldo Angst, RN-------------------

## 2013-04-03 LAB — GLUCOSE, CAPILLARY
Glucose-Capillary: 108 mg/dL — ABNORMAL HIGH (ref 70–99)
Glucose-Capillary: 90 mg/dL (ref 70–99)
Glucose-Capillary: 99 mg/dL (ref 70–99)
Glucose-Capillary: 115 mg/dL — ABNORMAL HIGH (ref 70–99)

## 2013-04-03 LAB — CBC
Hemoglobin: 7.7 g/dL — ABNORMAL LOW (ref 12.0–15.0)
MCHC: 32 g/dL (ref 30.0–36.0)
MCV: 73.7 fL — ABNORMAL LOW (ref 78.0–100.0)
Platelets: 313 10*3/uL (ref 150–400)
RBC: 3.27 MIL/uL — ABNORMAL LOW (ref 3.87–5.11)
RDW: 16.4 % — ABNORMAL HIGH (ref 11.5–15.5)
WBC: 9.7 10*3/uL (ref 4.0–10.5)

## 2013-04-03 MED ORDER — AMOXICILLIN-POT CLAVULANATE 500-125 MG PO TABS
1.0000 | ORAL_TABLET | Freq: Three times a day (TID) | ORAL | Status: DC
  Administered 2013-04-03 – 2013-04-04 (×5): 500 mg via ORAL
  Filled 2013-04-03 (×8): qty 1

## 2013-04-03 NOTE — Progress Notes (Signed)
Encouraged patient to ambulate four times this shift.  Patient ambulated in hall one time today despite multiple offers and encouragement.  Discussed the risks of immobility with the patient.  Will continue to monitor.  Osvaldo Angst, RN----------

## 2013-04-03 NOTE — Progress Notes (Signed)
Patient doing well.  Continues on SQ lovenox prophylaxis.  RN encouraging patient to ambulate often.    Continue current lovenox dose.  Hurley Cisco, Pharm. D.

## 2013-04-03 NOTE — Progress Notes (Signed)
Patient ID: Angelica Ortiz, female   DOB: 1966-02-12, 47 y.o.   MRN: [id number] Subjective: Patient reports tolerating PO and no problems voiding.  Her pain is improved since admission.    Objective: I have reviewed patient's vital signs, intake and output, medications, labs, microbiology and radiology results.  General: alert and no distress Resp: clear to auscultation bilaterally Cardio: regular rate and rhythm, S1, S2 normal, no murmur, click, rub or gallop Extremities: extremities normal, atraumatic, no cyanosis or edema Abd: drain noted in RLL; site sore but, no erythema or warmth CBC    Component Value Date/Time   WBC 9.7 04/03/2013 0502   RBC 3.27* 04/03/2013 0502   HGB 7.7* 04/03/2013 0502   HCT 24.1* 04/03/2013 0502   PLT 313 04/03/2013 0502   MCV 73.7* 04/03/2013 0502   MCH 23.5* 04/03/2013 0502   MCHC 32.0 04/03/2013 0502   RDW 16.4* 04/03/2013 0502   LYMPHSABS 1.3 04/01/2013 0937   MONOABS 1.0 04/01/2013 0937   EOSABS 0.0 04/01/2013 0937   BASOSABS 0.0 04/01/2013 0937   Drain output 115 cc total in the last 24 hours.   Assessment/Plan: TOA- POD#2 s/p perc drainage Pt improving clinically Patient lost IV access yesterday. Currently on Oral antibiotics Will contact IR to determine criteria for drain removal    LOS: 5 days    Viviann Broyles 04/03/2013, 8:39 AM

## 2013-04-04 LAB — CBC
Hemoglobin: 8.6 g/dL — ABNORMAL LOW (ref 12.0–15.0)
MCH: 23.4 pg — ABNORMAL LOW (ref 26.0–34.0)
MCHC: 32 g/dL (ref 30.0–36.0)
MCV: 73.3 fL — ABNORMAL LOW (ref 78.0–100.0)
Platelets: 373 10*3/uL (ref 150–400)
RDW: 16.5 % — ABNORMAL HIGH (ref 11.5–15.5)

## 2013-04-04 LAB — CULTURE, ROUTINE-ABSCESS

## 2013-04-04 MED ORDER — ACETAMINOPHEN 500 MG PO TABS: 1000.0000 mg | ORAL_TABLET | Freq: Four times a day (QID) | ORAL | Status: DC | PRN

## 2013-04-04 MED ORDER — IBUPROFEN 600 MG PO TABS: 600.0000 mg | ORAL_TABLET | Freq: Four times a day (QID) | ORAL | Status: DC | PRN

## 2013-04-04 MED ORDER — AMOXICILLIN-POT CLAVULANATE 500-125 MG PO TABS: 1.0000 | ORAL_TABLET | Freq: Three times a day (TID) | ORAL | Status: DC

## 2013-04-04 MED ORDER — METRONIDAZOLE 500 MG PO TABS: 500.0000 mg | ORAL_TABLET | Freq: Three times a day (TID) | ORAL | Status: DC

## 2013-04-04 NOTE — Discharge Summary (Signed)
Physician Discharge Summary  Patient ID: Angelica Ortiz MRN: [id number] DOB/AGE: 1965-08-26 47 y.o.  Admit date: 03/29/2013 Discharge date: 04/04/2013  Admission Diagnoses: left tubo ovarian abscess  Discharge Diagnoses: same Principal Problem:   TOA (tubo-ovarian abscess) Active Problems:   Pelvic abscess in female   Diverticulitis   Discharged Condition: good  Hospital Course: She was transferred from [facility] ER with a 10 cm left tuboovarian abscess. A CA-125 was normal at 8.9. She was treated with IV abx until she lost IV access. She was then changed to po flagyl and augmentin. She underwent IR placement of a drain and has drained nearly 900 cc since its placement 2 days prior to discharge home. She has remained afebrile for greater than 48 hours and subjectively feels much better.  Consults: IR at [facility]  Significant Diagnostic Studies: CT and u/s  Treatments: antibiotics: primaxin and IR placement of a drain  Discharge Exam: Blood pressure 115/81, pulse 89, temperature 98.2 F (36.8 C), temperature source Oral, resp. rate 21, height 5\' 6"  (1.676 m), weight 169.645 kg (374 lb), last menstrual period 03/16/2013, SpO2 98.00%. General appearance: alert Resp: clear to auscultation bilaterally Cardio: regular rate and rhythm, S1, S2 normal, no murmur, click, rub or gallop GI: soft, non-tender; bowel sounds normal; no masses,  no organomegaly  Disposition: 01-Home or Self Care   Future Appointments Provider Department Dept Phone   04/11/2013 1:45 PM Willodean Rosenthal, MD [facility] [phone]   04/26/2013 8:00 PM [facility] [phone]       Medication List         acetaminophen 500 MG tablet  Commonly known as:  TYLENOL  Take 2 tablets (1,000 mg total) by mouth every 6 (six) hours as needed for mild pain.     albuterol 108 (90 BASE) MCG/ACT inhaler  Commonly known as:  PROVENTIL HFA;VENTOLIN HFA   Inhale 2 puffs into the lungs 2 (two) times daily.     amoxicillin-clavulanate 500-125 MG per tablet  Commonly known as:  AUGMENTIN  Take 1 tablet (500 mg total) by mouth every 8 (eight) hours.     budesonide-formoterol 160-4.5 MCG/ACT inhaler  Commonly known as:  SYMBICORT  Inhale 2 puffs into the lungs daily.     fluticasone 50 MCG/ACT nasal spray  Commonly known as:  FLONASE  Place 2 sprays into both nostrils daily as needed for allergies or rhinitis.     glipiZIDE 10 MG tablet  Commonly known as:  GLUCOTROL  Take 10 mg by mouth daily before breakfast.     ibuprofen 600 MG tablet  Commonly known as:  ADVIL,MOTRIN  Take 1 tablet (600 mg total) by mouth every 6 (six) hours as needed for fever or headache.     lisinopril-hydrochlorothiazide 20-12.5 MG per tablet  Commonly known as:  PRINZIDE,ZESTORETIC  Take 1 tablet by mouth daily.     metroNIDAZOLE 500 MG tablet  Commonly known as:  FLAGYL  Take 1 tablet (500 mg total) by mouth every 8 (eight) hours.     omeprazole 40 MG capsule  Commonly known as:  PRILOSEC  Take 40 mg by mouth every evening.     oxybutynin 10 MG 24 hr tablet  Commonly known as:  DITROPAN-XL  Take 10 mg by mouth daily.     oxyCODONE-acetaminophen 5-325 MG per tablet  Commonly known as:  PERCOCET/ROXICET  Take 1 tablet by mouth every 12 (twelve) hours as needed for severe pain.  pregabalin 75 MG capsule  Commonly known as:  LYRICA  Take 75 mg by mouth 2 (two) times daily.     rizatriptan 10 MG tablet  Commonly known as:  MAXALT  Take 10 mg by mouth as needed for migraine (migraine). May repeat in 2 hours if needed     sitaGLIPtin 25 MG tablet  Commonly known as:  JANUVIA  Take 25 mg by mouth daily.           Follow-up Information   Follow up with Willodean Rosenthal, MD. (Keep appt 04-11-12)    Specialty:  Obstetrics and Gynecology   Contact information:   [address] [place] [address] [phone]        Signed: Allie Bossier 04/04/2013, 9:38 AM

## 2013-04-04 NOTE — Progress Notes (Signed)
Pt out in wheelchair  Teaching complete  Drain care reviewed  And follow up appointment

## 2013-04-05 ENCOUNTER — Telehealth (HOSPITAL_COMMUNITY): Payer: Self-pay | Admitting: Emergency Medicine

## 2013-04-05 LAB — CULTURE, BLOOD (ROUTINE X 2)
Culture: NO GROWTH
Culture: NO GROWTH

## 2013-04-05 NOTE — ED Notes (Signed)
Post ED Visit - Positive Culture Follow-up  Culture report reviewed by antimicrobial stewardship pharmacist: []  Wes Dulaney, Pharm.D., BCPS []  Celedonio Miyamoto, Pharm.D., BCPS []  Georgina Pillion, Pharm.D., BCPS []  [place], [address].D., BCPS, AAHIVP []  Estella Husk, Pharm.D., BCPS, AAHIVP [x]  Harland German, Pharm.D., BCPS  Positive abscess culture Treated with Keflex, organism sensitive to the same and no further patient follow-up is required at this time.  Zeb Comfort 04/05/2013, 11:38 AM

## 2013-04-06 LAB — ANAEROBIC CULTURE

## 2013-04-11 ENCOUNTER — Ambulatory Visit (INDEPENDENT_AMBULATORY_CARE_PROVIDER_SITE_OTHER): Payer: Medicare Other | Admitting: Obstetrics & Gynecology

## 2013-04-11 ENCOUNTER — Encounter: Payer: Self-pay | Admitting: Obstetrics & Gynecology

## 2013-04-11 VITALS — BP 130/91 | HR 93 | Temp 97.2°F | Ht 66.0 in | Wt 359.1 lb

## 2013-04-11 DIAGNOSIS — N7093 Salpingitis and oophoritis, unspecified: Secondary | ICD-10-CM

## 2013-04-11 DIAGNOSIS — N920 Excessive and frequent menstruation with regular cycle: Secondary | ICD-10-CM

## 2013-04-11 MED ORDER — METRONIDAZOLE 500 MG PO TABS: 500.0000 mg | ORAL_TABLET | Freq: Two times a day (BID) | ORAL | Status: DC

## 2013-04-11 MED ORDER — AMOXICILLIN-POT CLAVULANATE 875-125 MG PO TABS: 1.0000 | ORAL_TABLET | Freq: Two times a day (BID) | ORAL | Status: DC

## 2013-04-11 MED ORDER — MEGESTROL ACETATE 40 MG PO TABS: 40.0000 mg | ORAL_TABLET | Freq: Two times a day (BID) | ORAL | Status: DC

## 2013-04-11 NOTE — Patient Instructions (Addendum)
Pelvic Inflammatory Disease °Pelvic inflammatory disease (PID) refers to an infection in some or all of the female organs. The infection can be in the uterus, ovaries, fallopian tubes, or the surrounding tissues in the pelvis. PID can cause abdominal or pelvic pain that comes on suddenly (acute pelvic pain). PID is a serious infection because it can lead to lasting (chronic) pelvic pain or the inability to have children (infertile).  °CAUSES  °The infection is often caused by the normal bacteria found in the vaginal tissues. PID may also be caused by an infection that is spread during sexual contact. PID can also occur following:  °· The birth of a baby.   °· A miscarriage.   °· An abortion.   °· Major pelvic surgery.   °· The use of an intrauterine device (IUD).   °· A sexual assault.   °RISK FACTORS °Certain factors can put a person at higher risk for PID, such as: °· Being younger than 25 years. °· Being sexually active at a young age. °· Using nonbarrier contraception. °· Having multiple sexual partners. °· Having sex with someone who has symptoms of a genital infection. °· Using oral contraception. °Other times, certain behaviors can increase the possibility of getting PID, such as: °· Having sex during your period. °· Using a vaginal douche. °· Having an intrauterine device (IUD) in place. °SYMPTOMS  °· Abdominal or pelvic pain.   °· Fever.   °· Chills.   °· Abnormal vaginal discharge. °· Abnormal uterine bleeding.   °· Unusual pain shortly after finishing your period. °DIAGNOSIS  °Your caregiver will choose some of the following methods to make a diagnosis, such as:  °· Performing a physical exam and history. A pelvic exam typically reveals a very tender uterus and surrounding pelvis.   °· Ordering laboratory tests including a pregnancy test, blood tests, and urine test.  °· Ordering cultures of the vagina and cervix to check for a sexually transmitted infection (STI). °· Performing an ultrasound.    °· Performing a laparoscopic procedure to look inside the pelvis.   °TREATMENT  °· Antibiotic medicines may be prescribed and taken by mouth.   °· Sexual partners may be treated when the infection is caused by a sexually transmitted disease (STD).   °· Hospitalization may be needed to give antibiotics intravenously. °· Surgery may be needed, but this is rare. °It may take weeks until you are completely well. If you are diagnosed with PID, you should also be checked for human immunodeficiency virus (HIV).   °HOME CARE INSTRUCTIONS  °· If given, take your antibiotics as directed. Finish the medicine even if you start to feel better.   °· Only take over-the-counter or prescription medicines for pain, discomfort, or fever as directed by your caregiver.   °· Do not have sexual intercourse until treatment is completed or as directed by your caregiver. If PID is confirmed, your recent sexual partner(s) will need treatment.   °· Keep your follow-up appointments. °SEEK MEDICAL CARE IF:  °· You have increased or abnormal vaginal discharge.   °· You need prescription medicine for your pain.   °· You vomit.   °· You cannot take your medicines.   °· Your partner has an STD.   °SEEK IMMEDIATE MEDICAL CARE IF:  °· You have a fever.   °· You have increased abdominal or pelvic pain.   °· You have chills.   °· You have pain when you urinate.   °· You are not better after 72 hours following treatment.   °MAKE SURE YOU:  °· Understand these instructions. °· Will watch your condition. °· Will get help right away if you are not doing well or get worse. °  Document Released: 03/24/2005 Document Revised: 07/19/2012 Document Reviewed: 03/20/2011 [facility] Patient Information 2014 [employer], [place]. Menorrhagia Dysfunctional uterine bleeding is different from a normal menstrual period. When periods are heavy or there is more bleeding than is usual for you, it is called menorrhagia. It may be caused by hormonal imbalance, or physical,  metabolic, or other problems. Examination is necessary in order that your caregiver may treat treatable causes. If this is a continuing problem, a D&C may be needed. That means that the cervix (the opening of the uterus or womb) is dilated (stretched larger) and the lining of the uterus is scraped out. The tissue scraped out is then examined under a microscope by a specialist (pathologist) to make sure there is nothing of concern that needs further or more extensive treatment. HOME CARE INSTRUCTIONS   If medications were prescribed, take exactly as directed. Do not change or switch medications without consulting your caregiver.  Long term heavy bleeding may result in iron deficiency. Your caregiver may have prescribed iron pills. They help replace the iron your body lost from heavy bleeding. Take exactly as directed. Iron may cause constipation. If this becomes a problem, increase the bran, fruits, and roughage in your diet.  Do not take aspirin or medicines that contain aspirin one week before or during your menstrual period. Aspirin may make the bleeding worse.  If you need to change your sanitary pad or tampon more than once every 2 hours, stay in bed and rest as much as possible until the bleeding stops.  Eat well-balanced meals. Eat foods high in iron. Examples are leafy green vegetables, meat, liver, eggs, and whole grain breads and cereals. Do not try to lose weight until the abnormal bleeding has stopped and your blood iron level is back to normal. SEEK MEDICAL CARE IF:   You need to change your sanitary pad or tampon more than once an hour.  You develop nausea (feeling sick to your stomach) and vomiting, dizziness, or diarrhea while you are taking your medicine.  You have any problems that may be related to the medicine you are taking. SEEK IMMEDIATE MEDICAL CARE IF:   You have a fever.  You develop chills.  You develop severe bleeding or start to pass blood clots.  You feel dizzy  or faint. MAKE SURE YOU:   Understand these instructions.  Will watch your condition.  Will get help right away if you are not doing well or get worse. Document Released: 03/24/2005 Document Revised: 06/16/2011 Document Reviewed: 09/12/2012 [facility] Patient Information 2014 [place], [place].

## 2013-04-11 NOTE — Progress Notes (Signed)
Subjective:     Patient ID: Angelica Ortiz, female   DOB: 04/27/1965, [age] y.o.   MRN: [id number]  HPI Pt is a hosp f/u from recent TOA.  She reports improved pain. No N/V/F/C.  She has completed her atbx today.  She reports 20-35cc per day from the drain.  She also reports daily bleeding.  She reports that it started prior to her hospitalization and has continued.  It is not assoc with pain.   Review of Systems     Objective:   Physical Exam BP 130/91  Pulse 93  Temp(Src) 97.2 F (36.2 C) (Oral)  Ht 5\' 6"  (1.676 m)  Wt 359 lb 1.6 oz (162.887 kg)  BMI 57.99 kg/m2  LMP 03/16/2013 Pt in NAD  Abd: morbidly obese; Percutaneous drain in LLQ.  Serous drainage in bag.  Drain removed without difficulty.   Blood and wound cx neg from hosp     Assessment:     TOA- clinically improved.    Plan:     Cont 1 additional week of atbx F/u in  6weeks F/u sono in 4 weeks  Needs Endobx at next visit Megace 40mg  bid

## 2013-04-26 ENCOUNTER — Telehealth: Payer: Self-pay

## 2013-04-26 ENCOUNTER — Encounter (HOSPITAL_BASED_OUTPATIENT_CLINIC_OR_DEPARTMENT_OTHER): Payer: Medicare Other

## 2013-04-26 DIAGNOSIS — B3731 Acute candidiasis of vulva and vagina: Secondary | ICD-10-CM

## 2013-04-26 DIAGNOSIS — B373 Candidiasis of vulva and vagina: Secondary | ICD-10-CM

## 2013-04-26 MED ORDER — FLUCONAZOLE 150 MG PO TABS: 150.0000 mg | ORAL_TABLET | Freq: Once | ORAL | Status: DC

## 2013-04-26 NOTE — Telephone Encounter (Signed)
Pt. Called stating she took the ABX Dr. Erin FullingHarraway-Smith had prescribed her but now she has a yeast infection and would like Diflucan prescribed. Called pt. Who describes vaginal itching and thick white discharge. Informed pt. That [facility] Pt. Verbalized understanding and gratitude and had no other questions or concerns.

## 2013-05-12 ENCOUNTER — Ambulatory Visit (HOSPITAL_COMMUNITY)
Admission: RE | Admit: 2013-05-12 | Discharge: 2013-05-12 | Disposition: A | Discharge status: Home / Self Care | Payer: Medicare Other | Source: Ambulatory Visit | Attending: Obstetrics & Gynecology | Admitting: Obstetrics & Gynecology

## 2013-05-12 DIAGNOSIS — N72 Inflammatory disease of cervix uteri: Secondary | ICD-10-CM | POA: Insufficient documentation

## 2013-05-12 DIAGNOSIS — N83209 Unspecified ovarian cyst, unspecified side: Secondary | ICD-10-CM | POA: Insufficient documentation

## 2013-05-12 DIAGNOSIS — N920 Excessive and frequent menstruation with regular cycle: Secondary | ICD-10-CM | POA: Insufficient documentation

## 2013-05-12 DIAGNOSIS — N7093 Salpingitis and oophoritis, unspecified: Secondary | ICD-10-CM | POA: Insufficient documentation

## 2013-05-13 ENCOUNTER — Encounter: Payer: Medicare Other | Admitting: Obstetrics & Gynecology

## 2013-05-16 ENCOUNTER — Encounter: Payer: Self-pay | Admitting: *Deleted

## 2013-05-24 ENCOUNTER — Encounter (HOSPITAL_BASED_OUTPATIENT_CLINIC_OR_DEPARTMENT_OTHER): Payer: Medicare Other

## 2013-05-30 ENCOUNTER — Encounter: Payer: Medicare Other | Admitting: Obstetrics & Gynecology

## 2013-06-05 DIAGNOSIS — G4730 Sleep apnea, unspecified: Secondary | ICD-10-CM

## 2013-06-05 HISTORY — DX: Sleep apnea, unspecified: G47.30

## 2013-06-06 ENCOUNTER — Other Ambulatory Visit (HOSPITAL_COMMUNITY)
Admission: RE | Admit: 2013-06-06 | Discharge: 2013-06-06 | Disposition: A | Discharge status: Home / Self Care | Payer: Medicare Other | Source: Ambulatory Visit | Attending: Obstetrics & Gynecology | Admitting: Obstetrics & Gynecology

## 2013-06-06 ENCOUNTER — Ambulatory Visit (INDEPENDENT_AMBULATORY_CARE_PROVIDER_SITE_OTHER): Payer: Medicare Other | Admitting: Obstetrics & Gynecology

## 2013-06-06 ENCOUNTER — Encounter: Payer: Self-pay | Admitting: Obstetrics & Gynecology

## 2013-06-06 VITALS — BP 147/100 | HR 100 | Temp 97.9°F | Ht 66.0 in | Wt 355.5 lb

## 2013-06-06 DIAGNOSIS — Z01812 Encounter for preprocedural laboratory examination: Secondary | ICD-10-CM

## 2013-06-06 DIAGNOSIS — Z124 Encounter for screening for malignant neoplasm of cervix: Secondary | ICD-10-CM | POA: Insufficient documentation

## 2013-06-06 DIAGNOSIS — R188 Other ascites: Secondary | ICD-10-CM

## 2013-06-06 DIAGNOSIS — Z1151 Encounter for screening for human papillomavirus (HPV): Secondary | ICD-10-CM | POA: Insufficient documentation

## 2013-06-06 DIAGNOSIS — Z0000 Encounter for general adult medical examination without abnormal findings: Secondary | ICD-10-CM

## 2013-06-06 DIAGNOSIS — N938 Other specified abnormal uterine and vaginal bleeding: Secondary | ICD-10-CM

## 2013-06-06 DIAGNOSIS — N939 Abnormal uterine and vaginal bleeding, unspecified: Principal | ICD-10-CM | POA: Insufficient documentation

## 2013-06-06 DIAGNOSIS — N949 Unspecified condition associated with female genital organs and menstrual cycle: Secondary | ICD-10-CM

## 2013-06-06 DIAGNOSIS — N926 Irregular menstruation, unspecified: Secondary | ICD-10-CM | POA: Insufficient documentation

## 2013-06-06 DIAGNOSIS — N920 Excessive and frequent menstruation with regular cycle: Secondary | ICD-10-CM

## 2013-06-06 LAB — TSH: TSH: 0.915 u[IU]/mL (ref 0.350–4.500)

## 2013-06-06 LAB — POCT PREGNANCY, URINE: PREG TEST UR: NEGATIVE

## 2013-06-06 NOTE — Progress Notes (Signed)
   Subjective:    Patient ID: Angelica Ortiz, female    DOB: 12/17/1965, 48 y.o.   MRN: [id number]  HPI  This pleasant morbidly obese lady is here today for a EMBX  Review of Systems She had a flu vaccine this season    Objective:   Physical Exam   UPT negative, consent signed, time out done Cervix prepped with betadine and grasped with a single tooth tenaculum Uterus sounded to 11 cm Pipelle used for 2 passes with a moderate amount of tissue obtained. She tolerated the procedure well.        Assessment & Plan:   DUB-await EMBX results Check TSH Preventative- pap smear sent

## 2013-06-07 ENCOUNTER — Ambulatory Visit (HOSPITAL_BASED_OUTPATIENT_CLINIC_OR_DEPARTMENT_OTHER): Payer: Medicare Other | Attending: Anesthesiology | Admitting: Radiology

## 2013-06-07 VITALS — Ht 66.0 in | Wt 354.0 lb

## 2013-06-07 DIAGNOSIS — G4730 Sleep apnea, unspecified: Secondary | ICD-10-CM

## 2013-06-07 DIAGNOSIS — G4733 Obstructive sleep apnea (adult) (pediatric): Secondary | ICD-10-CM | POA: Insufficient documentation

## 2013-06-11 DIAGNOSIS — G4730 Sleep apnea, unspecified: Secondary | ICD-10-CM

## 2013-06-11 NOTE — Sleep Study (Signed)
   NAME: Angelica Ortiz DATE OF BIRTH:  09/08/1965 MEDICAL RECORD NUMBER [id number]  LOCATION: [facility]  PHYSICIAN: YOUNG,CLINTON D  DATE OF STUDY: 06/07/2013  SLEEP STUDY TYPE: Nocturnal Polysomnogram               REFERRING PHYSICIAN: Tonye RoyaltyGyarteng-Dakwa, Kwadwo,*  INDICATION FOR STUDY: Hypersomnia with sleep apnea  EPWORTH SLEEPINESS SCORE:   7/24 HEIGHT: 5\' 6"  (167.6 cm)  WEIGHT: 354 lb (160.573 kg)    Body mass index is 57.16 kg/(m^2).  NECK SIZE: 19 in.  MEDICATIONS: Charted for review  SLEEP ARCHITECTURE: Total sleep time 191.5 minutes with sleep efficiency 47.6%. Stage I was 16.4%, stage II 77.8%, stage III absent, REM 5.7% of total sleep time. Sleep latency 89 minutes, REM latency 239.5 minutes, awake after sleep onset 116 minutes, arousal index 31. Bedtime medication: Lisinopril/HCTZ   The patient had significant difficulty initiating and maintaining sleep.  RESPIRATORY DATA: Apnea hypopnea index (AHI) 64.2 per hour. 205 total events scored including 30 obstructive apneas and 175 hypopneas. Events were in all positions. REM AHI 92.7 per hour.  OXYGEN DATA: Moderately loud snoring with oxygen desaturation to a nadir of 60% and a mean oxygen saturation through the study of 93.3% on room air.  CARDIAC DATA: Sinus rhythm with PACs  MOVEMENT/PARASOMNIA: No significant movement disturbance. Bathroom x1  IMPRESSION/ RECOMMENDATION:   1) Severe obstructive sleep apnea/hypopnea syndrome, AHI 64.2 per hour with non-positional events. REM AHI 92.7 per hour. Moderately loud snoring with oxygen desaturation to a nadir of 60% and a mean oxygen saturation through the study of 93.3% on room air. 2) The patient had significant difficulty initiating and maintaining sleep, with sleep onset after midnight and then awake again between 1:30 and 3 AM. This prevented application of split protocol CPAP titration. The patient can return for dedicated CPAP titration study if  appropriate.  Signed Jetty Duhamellinton Young M.D. Waymon BudgeYOUNG,CLINTON D Diplomate, American Board of Sleep Medicine  ELECTRONICALLY SIGNED ON:  06/11/2013, 10:57 AM [facility] PH: [phone]   FX: [phone] ACCREDITED BY THE AMERICAN ACADEMY OF SLEEP MEDICINE

## 2013-06-20 ENCOUNTER — Ambulatory Visit: Payer: Medicare Other | Admitting: Family Medicine

## 2013-06-22 ENCOUNTER — Telehealth: Payer: Self-pay | Admitting: *Deleted

## 2013-06-22 ENCOUNTER — Ambulatory Visit: Payer: Medicare Other | Admitting: Obstetrics & Gynecology

## 2013-06-22 DIAGNOSIS — N7093 Salpingitis and oophoritis, unspecified: Secondary | ICD-10-CM

## 2013-06-22 DIAGNOSIS — N920 Excessive and frequent menstruation with regular cycle: Secondary | ICD-10-CM

## 2013-06-22 MED ORDER — MEGESTROL ACETATE 40 MG PO TABS: 40.0000 mg | ORAL_TABLET | Freq: Two times a day (BID) | ORAL | Status: DC

## 2013-06-22 NOTE — Telephone Encounter (Signed)
Called pt per request of Dr. Erin FullingHarraway-Smith.   Pt informed that she does not need appt in clinic today. I informed her of the following test results: negative endometrial biopsy, negative Pap and normal TSH.  Pt should continue taking Megace 40 mg by mouth twice daily and refills will be sent to her pharmacy. Pt should follow up in 1 year for annual Gyn exam or sooner if needed for problems.  Pt voiced understanding.

## 2013-06-27 ENCOUNTER — Encounter: Payer: Self-pay | Admitting: *Deleted

## 2013-07-15 ENCOUNTER — Other Ambulatory Visit: Payer: Self-pay | Admitting: Obstetrics & Gynecology

## 2013-08-17 ENCOUNTER — Other Ambulatory Visit: Payer: Self-pay | Admitting: Obstetrics & Gynecology

## 2013-09-14 ENCOUNTER — Telehealth: Payer: Self-pay

## 2013-09-14 DIAGNOSIS — N920 Excessive and frequent menstruation with regular cycle: Secondary | ICD-10-CM

## 2013-09-14 DIAGNOSIS — N7093 Salpingitis and oophoritis, unspecified: Secondary | ICD-10-CM

## 2013-09-14 MED ORDER — MEGESTROL ACETATE 40 MG PO TABS: 80.0000 mg | ORAL_TABLET | Freq: Two times a day (BID) | ORAL | Status: DC

## 2013-09-14 NOTE — Telephone Encounter (Signed)
Patient called stating she was seen by Dr. Erin Fulling and Dr. Marice Potter for a large cyst which was infected, she was hospitalized and abcess drained. Patient states she was put on megace but has since still been continuing to bleed and in consistent pain-- cannot get appointment until July-- would like to know what she can do.   Consulted Dr. Macon Large who stated patient should double dose, take 80mg  BID instead of the original 40mg  BID and can take ibuprofen for pain until follow up appointment. Called patient and informed her of increase in dose as well as the use of ibuprofen for the pain. Patient states she has fibromyalgia and is taking percocet for that pain and it is not cutting the pelvic pain. Reports feeling a deep twisting/burning sensation/pelvic pain very similar to that when she was admitted for the last TOA and reports it being continuous for the last week. Advised patient that if it is continuous and as described she should go to [facility] to be evaluated. Patient verbalized understanding and stated she would. Also requested refill of Megace as she is almost out. Megace e-prescribed. No further questions or concerns.

## 2013-09-22 ENCOUNTER — Encounter (HOSPITAL_COMMUNITY): Payer: Self-pay | Admitting: Emergency Medicine

## 2013-09-22 ENCOUNTER — Emergency Department (HOSPITAL_COMMUNITY)
Admission: EM | Admit: 2013-09-22 | Discharge: 2013-09-23 | Disposition: A | Discharge status: Home / Self Care | Payer: Medicare Other | Attending: Emergency Medicine | Admitting: Emergency Medicine

## 2013-09-22 ENCOUNTER — Emergency Department (HOSPITAL_COMMUNITY): Payer: Medicare Other

## 2013-09-22 DIAGNOSIS — Z87448 Personal history of other diseases of urinary system: Secondary | ICD-10-CM | POA: Diagnosis not present

## 2013-09-22 DIAGNOSIS — Y9289 Other specified places as the place of occurrence of the external cause: Secondary | ICD-10-CM | POA: Diagnosis not present

## 2013-09-22 DIAGNOSIS — Z86711 Personal history of pulmonary embolism: Secondary | ICD-10-CM | POA: Insufficient documentation

## 2013-09-22 DIAGNOSIS — W19XXXA Unspecified fall, initial encounter: Secondary | ICD-10-CM

## 2013-09-22 DIAGNOSIS — S99929A Unspecified injury of unspecified foot, initial encounter: Principal | ICD-10-CM

## 2013-09-22 DIAGNOSIS — IMO0002 Reserved for concepts with insufficient information to code with codable children: Secondary | ICD-10-CM | POA: Diagnosis not present

## 2013-09-22 DIAGNOSIS — E119 Type 2 diabetes mellitus without complications: Secondary | ICD-10-CM | POA: Diagnosis not present

## 2013-09-22 DIAGNOSIS — S8990XA Unspecified injury of unspecified lower leg, initial encounter: Secondary | ICD-10-CM | POA: Diagnosis present

## 2013-09-22 DIAGNOSIS — Y9389 Activity, other specified: Secondary | ICD-10-CM | POA: Insufficient documentation

## 2013-09-22 DIAGNOSIS — S99919A Unspecified injury of unspecified ankle, initial encounter: Secondary | ICD-10-CM | POA: Diagnosis present

## 2013-09-22 DIAGNOSIS — S4980XA Other specified injuries of shoulder and upper arm, unspecified arm, initial encounter: Secondary | ICD-10-CM | POA: Insufficient documentation

## 2013-09-22 DIAGNOSIS — Z79899 Other long term (current) drug therapy: Secondary | ICD-10-CM | POA: Insufficient documentation

## 2013-09-22 DIAGNOSIS — I10 Essential (primary) hypertension: Secondary | ICD-10-CM | POA: Diagnosis not present

## 2013-09-22 DIAGNOSIS — K219 Gastro-esophageal reflux disease without esophagitis: Secondary | ICD-10-CM | POA: Diagnosis not present

## 2013-09-22 DIAGNOSIS — M25512 Pain in left shoulder: Secondary | ICD-10-CM

## 2013-09-22 DIAGNOSIS — W06XXXA Fall from bed, initial encounter: Secondary | ICD-10-CM | POA: Diagnosis not present

## 2013-09-22 DIAGNOSIS — S46909A Unspecified injury of unspecified muscle, fascia and tendon at shoulder and upper arm level, unspecified arm, initial encounter: Secondary | ICD-10-CM | POA: Diagnosis not present

## 2013-09-22 DIAGNOSIS — J45909 Unspecified asthma, uncomplicated: Secondary | ICD-10-CM | POA: Insufficient documentation

## 2013-09-22 DIAGNOSIS — G43909 Migraine, unspecified, not intractable, without status migrainosus: Secondary | ICD-10-CM | POA: Insufficient documentation

## 2013-09-22 DIAGNOSIS — M25561 Pain in right knee: Secondary | ICD-10-CM

## 2013-09-22 MED ORDER — DIPHENHYDRAMINE HCL 25 MG PO CAPS
25.0000 mg | ORAL_CAPSULE | Freq: Once | ORAL | Status: AC
  Administered 2013-09-22: 25 mg via ORAL
  Filled 2013-09-22: qty 1

## 2013-09-22 MED ORDER — MORPHINE SULFATE 4 MG/ML IJ SOLN
8.0000 mg | Freq: Once | INTRAMUSCULAR | Status: AC
  Administered 2013-09-22: 8 mg via INTRAMUSCULAR
  Filled 2013-09-22: qty 2

## 2013-09-22 MED ORDER — OXYCODONE-ACETAMINOPHEN 5-325 MG PO TABS
2.0000 | ORAL_TABLET | Freq: Once | ORAL | Status: AC
  Administered 2013-09-22: 2 via ORAL
  Filled 2013-09-22: qty 2

## 2013-09-22 NOTE — ED Notes (Signed)
[facility] Expected date:  Expected time:  Means of arrival:  Comments: EMS 48 yo female/fall-exacerbated chronic shoulder and knee pain

## 2013-09-22 NOTE — ED Notes (Signed)
Pt presented by EMS, report of pt loosing balance secondary to her hx of osteoarthritis, denies head impact or LOC, reports 10/10 pain in her knee and shoulder.

## 2013-09-22 NOTE — ED Provider Notes (Signed)
CSN: [id number]     Arrival date & time 09/22/13  2033 History   First MD Initiated Contact with Patient 09/22/13 2052     Chief Complaint  Patient presents with  . Fall  . Knee Pain  . Shoulder Pain     (Consider location/radiation/quality/duration/timing/severity/associated sxs/prior Treatment) HPI Comments: 48 yo female presenting after a fall. She states that she was getting out of bed when her knee gave way, causing her to fall to the floor. She struck her left shoulder on her bed frame. She thinks she landed on her right knee. She has a history of fibromyalgia and osteoarthritis. She denies other significant injuries from the fall other than her shoulder and knee. She denies head trauma or neck trauma.  Patient is a 48 y.o. female presenting with fall.  Fall This is a recurrent problem. The current episode started less than 1 hour ago. Episode frequency: once. The problem has been resolved. Pertinent negatives include no chest pain, no abdominal pain and no shortness of breath. Associated symptoms comments: Left shoulder pain.  Right knee pain. The symptoms are aggravated by bending (moving extremities). Nothing relieves the symptoms. She has tried nothing for the symptoms.    Past Medical History  Diagnosis Date  . Diverticulitis   . Asthma   . Diabetes mellitus without complication   . Hypertension   . Fibromyalgia   . Lupus   . GERD (gastroesophageal reflux disease)   . Overactive bladder   . Migraine   . Morbid obesity 03/30/2013  . Pulmonary embolism     Age 77   Past Surgical History  Procedure Laterality Date  . Cholecystectomy    . Appendectomy    . Cesarean section  2001  . Rotator cuff repair    . Laminectomy    . Orif ankle fracture Left   . Dilation and curettage of uterus      x 3 for AUB  . Laparoscopic ovarian cystectomy Right 1994   History reviewed. No pertinent family history. History  Substance Use Topics  . Smoking status: Never Smoker   .  Smokeless tobacco: Never Used  . Alcohol Use: No   OB History   Grav Para Term Preterm Abortions TAB SAB Ect Mult Living   3 1 0 1 2 0 2 0 0 1      Review of Systems  Respiratory: Negative for shortness of breath.   Cardiovascular: Negative for chest pain.  Gastrointestinal: Negative for abdominal pain.  All other systems reviewed and are negative.     Allergies  Dilaudid and Levaquin  Home Medications   Prior to Admission medications   Medication Sig Start Date End Date Taking? Authorizing Provider  acetaminophen (TYLENOL) 500 MG tablet Take 2 tablets (1,000 mg total) by mouth every 6 (six) hours as needed for mild pain. 04/04/13   Allie Bossier, MD  albuterol (PROVENTIL HFA;VENTOLIN HFA) 108 (90 BASE) MCG/ACT inhaler Inhale 2 puffs into the lungs 2 (two) times daily.    Historical Provider, MD  budesonide-formoterol (SYMBICORT) 160-4.5 MCG/ACT inhaler Inhale 2 puffs into the lungs daily.    Historical Provider, MD  fluticasone (FLONASE) 50 MCG/ACT nasal spray Place 2 sprays into both nostrils daily as needed for allergies or rhinitis.    Historical Provider, MD  glipiZIDE (GLUCOTROL) 10 MG tablet Take 10 mg by mouth daily before breakfast.    Historical Provider, MD  ibuprofen (ADVIL,MOTRIN) 600 MG tablet Take 1 tablet (600 mg total) by mouth every  6 (six) hours as needed for fever or headache. 04/04/13   Allie BossierMyra C Dove, MD  lisinopril-hydrochlorothiazide (PRINZIDE,ZESTORETIC) 20-12.5 MG per tablet Take 1 tablet by mouth daily.    Historical Provider, MD  megestrol (MEGACE) 40 MG tablet Take 2 tablets (80 mg total) by mouth 2 (two) times daily. 09/14/13   Tereso NewcomerUgonna A Anyanwu, MD  metFORMIN (GLUCOPHAGE) 500 MG tablet Take 500 mg by mouth 2 (two) times daily with a meal.    Historical Provider, MD  omeprazole (PRILOSEC) 40 MG capsule Take 40 mg by mouth every evening.    Historical Provider, MD  oxybutynin (DITROPAN-XL) 10 MG 24 hr tablet Take 10 mg by mouth daily.    Historical Provider, MD   oxyCODONE-acetaminophen (PERCOCET/ROXICET) 5-325 MG per tablet Take 1 tablet by mouth every 12 (twelve) hours as needed for severe pain.    Historical Provider, MD  pregabalin (LYRICA) 75 MG capsule Take 75 mg by mouth 2 (two) times daily.    Historical Provider, MD  rizatriptan (MAXALT) 10 MG tablet Take 10 mg by mouth as needed for migraine (migraine). May repeat in 2 hours if needed    Historical Provider, MD  sitaGLIPtin (JANUVIA) 25 MG tablet Take 100 mg by mouth daily.     Historical Provider, MD   BP 134/70  Pulse 90  Temp(Src) 99.4 F (37.4 C) (Oral)  Resp 16  SpO2 99% Physical Exam  Nursing note and vitals reviewed. Constitutional: She is oriented to person, place, and time. She appears well-developed and well-nourished. No distress.  HENT:  Head: Normocephalic and atraumatic. Head is without raccoon's eyes and without Battle's sign.  Nose: Nose normal.  Mouth/Throat: Oropharynx is clear and moist.  Eyes: Conjunctivae and EOM are normal. Pupils are equal, round, and reactive to light. No scleral icterus.  Neck: Neck supple. No spinous process tenderness and no muscular tenderness present.  Cardiovascular: Normal rate, regular rhythm, normal heart sounds and intact distal pulses.   No murmur heard. Pulmonary/Chest: Effort normal and breath sounds normal. No stridor. No respiratory distress. She has no rales. She exhibits no tenderness.  Abdominal: Soft. Bowel sounds are normal. She exhibits no distension. There is no tenderness. There is no rebound and no guarding.  Musculoskeletal: Normal range of motion. She exhibits no edema.       Left shoulder: She exhibits tenderness and bony tenderness. She exhibits normal range of motion (pain on passive range of motion), no swelling, no effusion, no crepitus, no deformity, no laceration, normal pulse and normal strength.       Right knee: She exhibits normal range of motion (Pain with passive range of motion ), no swelling, no effusion,  no ecchymosis and no deformity. Tenderness found.       Thoracic back: She exhibits no tenderness and no bony tenderness.       Lumbar back: She exhibits no tenderness and no bony tenderness.  No evidence of trauma to extremities, except as noted.  2+ distal pulses.    Musculoskeletal exam severely limited by patient's obesity.  Neurological: She is alert and oriented to person, place, and time.  Skin: Skin is warm and dry. No rash noted.  Psychiatric: She has a normal mood and affect. Her behavior is normal.    ED Course  Procedures (including critical care time) Labs Review Labs Reviewed - No data to display  Imaging Review Dg Shoulder Left  09/22/2013   CLINICAL DATA:  Fall.  Shoulder pain.  EXAM: LEFT SHOULDER - 2+  VIEW  COMPARISON:  None.  FINDINGS: Moderate AC joint osteoarthritis. No displaced fracture is identified. Small ossific density is present adjacent to the coracoid. This is unlikely to represent fracture in the absence of significant traumatic force. Study is degraded by obese body habitus. Glenohumeral joint appears located. Axillary view could not be obtained.  IMPRESSION: 1. Study degraded by obese body habitus. 2. No definite acute osseous abnormality.   Electronically Signed   By: Andreas NewportGeoffrey  Lamke M.D.   On: 09/22/2013 22:54   Dg Knee Complete 4 Views Right  09/22/2013   CLINICAL DATA:  Right knee pain.  Diffuse knee pain.  EXAM: RIGHT KNEE - COMPLETE 4+ VIEW  COMPARISON:  None.  FINDINGS: Tricompartmental osteoarthritis is present, tricompartmental osteoarthritis is present, moderate to severe in the medial compartment and severe in the patellofemoral compartment. No effusion. No fracture. The alignment of the knee is anatomic. Marginal osteophytes are present in all 3 compartments. Visualization of the knee is technically degraded by obese body habitus.  IMPRESSION: Age advanced tricompartmental osteoarthritis without acute osseous injury.   Electronically Signed   By:  Andreas NewportGeoffrey  Lamke M.D.   On: 09/22/2013 22:55  All radiology studies independently viewed by me.      EKG Interpretation None      MDM   Final diagnoses:  Fall, initial encounter  Left shoulder pain  Right knee pain    48 roll female with mechanical fall complaining of left shoulder and right knee pain. Plain films of these areas are negative for acute injuries. She is in the process of being fitted for knee braces, which she reports is to keep her from falling so frequently. Given IM morphine and subsequently Percocet for pain.  Appears stable for discharge home.    Candyce ChurnJohn David Wofford III, MD 09/23/13 [phone]

## 2013-09-23 DIAGNOSIS — S8990XA Unspecified injury of unspecified lower leg, initial encounter: Secondary | ICD-10-CM | POA: Diagnosis not present

## 2013-09-23 DIAGNOSIS — S99919A Unspecified injury of unspecified ankle, initial encounter: Secondary | ICD-10-CM | POA: Diagnosis not present

## 2013-09-23 MED ORDER — HYDROCODONE-ACETAMINOPHEN 5-325 MG PO TABS: 1.0000 | ORAL_TABLET | ORAL | Status: DC | PRN

## 2013-09-23 NOTE — Discharge Instructions (Signed)
Fall Prevention and Home Safety Falls cause injuries and can affect all age groups. It is possible to use preventive measures to significantly decrease the likelihood of falls. There are many simple measures which can make your home safer and prevent falls. OUTDOORS  Repair cracks and edges of walkways and driveways.  Remove high doorway thresholds.  Trim shrubbery on the main path into your home.  Have good outside lighting.  Clear walkways of tools, rocks, debris, and clutter.  Check that handrails are not broken and are securely fastened. Both sides of steps should have handrails.  Have leaves, snow, and ice cleared regularly.  Use sand or salt on walkways during winter months.  In the garage, clean up grease or oil spills. BATHROOM  Install night lights.  Install grab bars by the toilet and in the tub and shower.  Use non-skid mats or decals in the tub or shower.  Place a plastic non-slip stool in the shower to sit on, if needed.  Keep floors dry and clean up all water on the floor immediately.  Remove soap buildup in the tub or shower on a regular basis.  Secure bath mats with non-slip, double-sided rug tape.  Remove throw rugs and tripping hazards from the floors. BEDROOMS  Install night lights.  Make sure a bedside light is easy to reach.  Do not use oversized bedding.  Keep a telephone by your bedside.  Have a firm chair with side arms to use for getting dressed.  Remove throw rugs and tripping hazards from the floor. KITCHEN  Keep handles on pots and pans turned toward the center of the stove. Use back burners when possible.  Clean up spills quickly and allow time for drying.  Avoid walking on wet floors.  Avoid hot utensils and knives.  Position shelves so they are not too high or low.  Place commonly used objects within easy reach.  If necessary, use a sturdy step stool with a grab bar when reaching.  Keep electrical cables out of the  way.  Do not use floor polish or wax that makes floors slippery. If you must use wax, use non-skid floor wax.  Remove throw rugs and tripping hazards from the floor. STAIRWAYS  Never leave objects on stairs.  Place handrails on both sides of stairways and use them. Fix any loose handrails. Make sure handrails on both sides of the stairways are as long as the stairs.  Check carpeting to make sure it is firmly attached along stairs. Make repairs to worn or loose carpet promptly.  Avoid placing throw rugs at the top or bottom of stairways, or properly secure the rug with carpet tape to prevent slippage. Get rid of throw rugs, if possible.  Have an electrician put in a light switch at the top and bottom of the stairs. OTHER FALL PREVENTION TIPS  Wear low-heel or rubber-soled shoes that are supportive and fit well. Wear closed toe shoes.  When using a stepladder, make sure it is fully opened and both spreaders are firmly locked. Do not climb a closed stepladder.  Add color or contrast paint or tape to grab bars and handrails in your home. Place contrasting color strips on first and last steps.  Learn and use mobility aids as needed. Install an electrical emergency response system.  Turn on lights to avoid dark areas. Replace light bulbs that burn out immediately. Get light switches that glow.  Arrange furniture to create clear pathways. Keep furniture in the same place.  Firmly attach carpet with non-skid or double-sided tape.  Eliminate uneven floor surfaces.  Select a carpet pattern that does not visually hide the edge of steps.  Be aware of all pets. OTHER HOME SAFETY TIPS  Set the water temperature for 120 F (48.8 C).  Keep emergency numbers on or near the telephone.  Keep smoke detectors on every level of the home and near sleeping areas. Document Released: 03/14/2002 Document Revised: 09/23/2011 Document Reviewed: 06/13/2011 [facility] Patient Information 2015  [place], [place]. This information is not intended to replace advice given to you by your health care provider. Make sure you discuss any questions you have with your health care provider.  Knee Pain The knee is the complex joint between your thigh and your lower leg. It is made up of bones, tendons, ligaments, and cartilage. The bones that make up the knee are:  The femur in the thigh.  The tibia and fibula in the lower leg.  The patella or kneecap riding in the groove on the lower femur. CAUSES  Knee pain is a common complaint with many causes. A few of these causes are:  Injury, such as:  A ruptured ligament or tendon injury.  Torn cartilage.  Medical conditions, such as:  Gout  Arthritis  Infections  Overuse, over training, or overdoing a physical activity. Knee pain can be minor or severe. Knee pain can accompany debilitating injury. Minor knee problems often respond well to self-care measures or get well on their own. More serious injuries may need medical intervention or even surgery. SYMPTOMS The knee is complex. Symptoms of knee problems can vary widely. Some of the problems are:  Pain with movement and weight bearing.  Swelling and tenderness.  Buckling of the knee.  Inability to straighten or extend your knee.  Your knee locks and you cannot straighten it.  Warmth and redness with pain and fever.  Deformity or dislocation of the kneecap. DIAGNOSIS  Determining what is wrong may be very straight forward such as when there is an injury. It can also be challenging because of the complexity of the knee. Tests to make a diagnosis may include:  Your caregiver taking a history and doing a physical exam.  Routine X-rays can be used to rule out other problems. X-rays will not reveal a cartilage tear. Some injuries of the knee can be diagnosed by:  Arthroscopy a surgical technique by which a small video camera is inserted through tiny incisions on the sides of the  knee. This procedure is used to examine and repair internal knee joint problems. Tiny instruments can be used during arthroscopy to repair the torn knee cartilage (meniscus).  Arthrography is a radiology technique. A contrast liquid is directly injected into the knee joint. Internal structures of the knee joint then become visible on X-ray film.  An MRI scan is a non X-ray radiology procedure in which magnetic fields and a computer produce two- or three-dimensional images of the inside of the knee. Cartilage tears are often visible using an MRI scanner. MRI scans have largely replaced arthrography in diagnosing cartilage tears of the knee.  Blood work.  Examination of the fluid that helps to lubricate the knee joint (synovial fluid). This is done by taking a sample out using a needle and a syringe. TREATMENT The treatment of knee problems depends on the cause. Some of these treatments are:  Depending on the injury, proper casting, splinting, surgery, or physical therapy care will be needed.  Give yourself adequate recovery  time. Do not overuse your joints. If you begin to get sore during workout routines, back off. Slow down or do fewer repetitions.  For repetitive activities such as cycling or running, maintain your strength and nutrition.  Alternate muscle groups. For example, if you are a weight lifter, work the upper body on one day and the lower body the next.  Either tight or weak muscles do not give the proper support for your knee. Tight or weak muscles do not absorb the stress placed on the knee joint. Keep the muscles surrounding the knee strong.  Take care of mechanical problems.  If you have flat feet, orthotics or special shoes may help. See your caregiver if you need help.  Arch supports, sometimes with wedges on the inner or outer aspect of the heel, can help. These can shift pressure away from the side of the knee most bothered by osteoarthritis.  A brace called an  "unloader" brace also may be used to help ease the pressure on the most arthritic side of the knee.  If your caregiver has prescribed crutches, braces, wraps or ice, use as directed. The acronym for this is PRICE. This means protection, rest, ice, compression, and elevation.  Nonsteroidal anti-inflammatory drugs (NSAIDs), can help relieve pain. But if taken immediately after an injury, they may actually increase swelling. Take NSAIDs with food in your stomach. Stop them if you develop stomach problems. Do not take these if you have a history of ulcers, stomach pain, or bleeding from the bowel. Do not take without your caregiver's approval if you have problems with fluid retention, heart failure, or kidney problems.  For ongoing knee problems, physical therapy may be helpful.  Glucosamine and chondroitin are over-the-counter dietary supplements. Both may help relieve the pain of osteoarthritis in the knee. These medicines are different from the usual anti-inflammatory drugs. Glucosamine may decrease the rate of cartilage destruction.  Injections of a corticosteroid drug into your knee joint may help reduce the symptoms of an arthritis flare-up. They may provide pain relief that lasts a few months. You may have to wait a few months between injections. The injections do have a small increased risk of infection, water retention, and elevated blood sugar levels.  Hyaluronic acid injected into damaged joints may ease pain and provide lubrication. These injections may work by reducing inflammation. A series of shots may give relief for as long as 6 months.  Topical painkillers. Applying certain ointments to your skin may help relieve the pain and stiffness of osteoarthritis. Ask your pharmacist for suggestions. Many over the-counter products are approved for temporary relief of arthritis pain.  In some countries, doctors often prescribe topical NSAIDs for relief of chronic conditions such as arthritis and  tendinitis. A review of treatment with NSAID creams found that they worked as well as oral medications but without the serious side effects. PREVENTION  Maintain a healthy weight. Extra pounds put more strain on your joints.  Get strong, stay limber. Weak muscles are a common cause of knee injuries. Stretching is important. Include flexibility exercises in your workouts.  Be smart about exercise. If you have osteoarthritis, chronic knee pain or recurring injuries, you may need to change the way you exercise. This does not mean you have to stop being active. If your knees ache after jogging or playing basketball, consider switching to swimming, water aerobics, or other low-impact activities, at least for a few days a week. Sometimes limiting high-impact activities will provide relief.  Make sure  your shoes fit well. Choose footwear that is right for your sport.  Protect your knees. Use the proper gear for knee-sensitive activities. Use kneepads when playing volleyball or laying carpet. Buckle your seat belt every time you drive. Most shattered kneecaps occur in car accidents.  Rest when you are tired. SEEK MEDICAL CARE IF:  You have knee pain that is continual and does not seem to be getting better.  SEEK IMMEDIATE MEDICAL CARE IF:  Your knee joint feels hot to the touch and you have a high fever. MAKE SURE YOU:   Understand these instructions.  Will watch your condition.  Will get help right away if you are not doing well or get worse. Document Released: 01/19/2007 Document Revised: 06/16/2011 Document Reviewed: 01/19/2007 [facility] Patient Information 2015 [place], [place]. This information is not intended to replace advice given to you by your health care provider. Make sure you discuss any questions you have with your health care provider.  Shoulder Pain The shoulder is the joint that connects your arms to your body. The bones that form the shoulder joint include the upper arm bone  (humerus), the shoulder blade (scapula), and the collarbone (clavicle). The top of the humerus is shaped like a ball and fits into a rather flat socket on the scapula (glenoid cavity). A combination of muscles and strong, fibrous tissues that connect muscles to bones (tendons) support your shoulder joint and hold the ball in the socket. Small, fluid-filled sacs (bursae) are located in different areas of the joint. They act as cushions between the bones and the overlying soft tissues and help reduce friction between the gliding tendons and the bone as you move your arm. Your shoulder joint allows a wide range of motion in your arm. This range of motion allows you to do things like scratch your back or throw a ball. However, this range of motion also makes your shoulder more prone to pain from overuse and injury. Causes of shoulder pain can originate from both injury and overuse and usually can be grouped in the following four categories:  Redness, swelling, and pain (inflammation) of the tendon (tendinitis) or the bursae (bursitis).  Instability, such as a dislocation of the joint.  Inflammation of the joint (arthritis).  Broken bone (fracture). HOME CARE INSTRUCTIONS   Apply ice to the sore area.  Put ice in a plastic bag.  Place a towel between your skin and the bag.  Leave the ice on for 15-20 minutes, 3-4 times per day for the first 2 days, or as directed by your health care provider.  Stop using cold packs if they do not help with the pain.  If you have a shoulder sling or immobilizer, wear it as long as your caregiver instructs. Only remove it to shower or bathe. Move your arm as little as possible, but keep your hand moving to prevent swelling.  Squeeze a soft ball or foam pad as much as possible to help prevent swelling.  Only take over-the-counter or prescription medicines for pain, discomfort, or fever as directed by your caregiver. SEEK MEDICAL CARE IF:   Your shoulder pain  increases, or new pain develops in your arm, hand, or fingers.  Your hand or fingers become cold and numb.  Your pain is not relieved with medicines. SEEK IMMEDIATE MEDICAL CARE IF:   Your arm, hand, or fingers are numb or tingling.  Your arm, hand, or fingers are significantly swollen or turn white or blue. MAKE SURE YOU:  Understand these instructions.  Will watch your condition.  Will get help right away if you are not doing well or get worse. Document Released: 01/01/2005 Document Revised: 03/29/2013 Document Reviewed: 03/08/2011 [facility] Patient Information 2015 [place], [place]. This information is not intended to replace advice given to you by your health care provider. Make sure you discuss any questions you have with your health care provider.

## 2013-09-27 ENCOUNTER — Other Ambulatory Visit: Payer: Self-pay | Admitting: Orthopedic Surgery

## 2013-09-27 DIAGNOSIS — M25512 Pain in left shoulder: Secondary | ICD-10-CM

## 2013-10-04 ENCOUNTER — Ambulatory Visit
Admission: RE | Admit: 2013-10-04 | Discharge: 2013-10-04 | Disposition: A | Discharge status: Home / Self Care | Payer: Medicare Other | Source: Ambulatory Visit | Attending: Orthopedic Surgery | Admitting: Orthopedic Surgery

## 2013-10-04 DIAGNOSIS — M25512 Pain in left shoulder: Secondary | ICD-10-CM

## 2013-10-10 ENCOUNTER — Telehealth: Payer: Self-pay | Admitting: *Deleted

## 2013-10-10 DIAGNOSIS — N921 Excessive and frequent menstruation with irregular cycle: Secondary | ICD-10-CM

## 2013-10-10 DIAGNOSIS — N7093 Salpingitis and oophoritis, unspecified: Secondary | ICD-10-CM

## 2013-10-10 MED ORDER — MEGESTROL ACETATE 40 MG PO TABS: 80.0000 mg | ORAL_TABLET | Freq: Two times a day (BID) | ORAL | Status: DC

## 2013-10-10 NOTE — Telephone Encounter (Signed)
[facility] called and stated that patients megace rx didn't have enough quantity for what she is taking. Patient takes 2 tabs bid and was only given quantity of 60. New rx sent in.

## 2013-11-30 ENCOUNTER — Encounter (HOSPITAL_COMMUNITY): Payer: Self-pay | Admitting: Emergency Medicine

## 2013-11-30 ENCOUNTER — Emergency Department (HOSPITAL_COMMUNITY)
Admission: EM | Admit: 2013-11-30 | Discharge: 2013-12-01 | Disposition: A | Discharge status: Home / Self Care | Payer: Medicare Other | Attending: Emergency Medicine | Admitting: Emergency Medicine

## 2013-11-30 DIAGNOSIS — I10 Essential (primary) hypertension: Secondary | ICD-10-CM | POA: Insufficient documentation

## 2013-11-30 DIAGNOSIS — Z79899 Other long term (current) drug therapy: Secondary | ICD-10-CM | POA: Diagnosis not present

## 2013-11-30 DIAGNOSIS — Z8742 Personal history of other diseases of the female genital tract: Secondary | ICD-10-CM | POA: Insufficient documentation

## 2013-11-30 DIAGNOSIS — R52 Pain, unspecified: Secondary | ICD-10-CM | POA: Insufficient documentation

## 2013-11-30 DIAGNOSIS — E119 Type 2 diabetes mellitus without complications: Secondary | ICD-10-CM | POA: Diagnosis not present

## 2013-11-30 DIAGNOSIS — J45909 Unspecified asthma, uncomplicated: Secondary | ICD-10-CM | POA: Insufficient documentation

## 2013-11-30 DIAGNOSIS — K219 Gastro-esophageal reflux disease without esophagitis: Secondary | ICD-10-CM | POA: Insufficient documentation

## 2013-11-30 DIAGNOSIS — M791 Myalgia, unspecified site: Secondary | ICD-10-CM

## 2013-11-30 DIAGNOSIS — IMO0001 Reserved for inherently not codable concepts without codable children: Secondary | ICD-10-CM | POA: Diagnosis not present

## 2013-11-30 LAB — CBC WITH DIFFERENTIAL/PLATELET
BASOS PCT: 0 % (ref 0–1)
Basophils Absolute: 0 10*3/uL (ref 0.0–0.1)
Eosinophils Absolute: 0.3 10*3/uL (ref 0.0–0.7)
Eosinophils Relative: 3 % (ref 0–5)
HCT: 34.8 % — ABNORMAL LOW (ref 36.0–46.0)
Hemoglobin: 11 g/dL — ABNORMAL LOW (ref 12.0–15.0)
LYMPHS ABS: 2.9 10*3/uL (ref 0.7–4.0)
Lymphocytes Relative: 32 % (ref 12–46)
MCH: 24.7 pg — AB (ref 26.0–34.0)
MCHC: 31.6 g/dL (ref 30.0–36.0)
MCV: 78 fL (ref 78.0–100.0)
Monocytes Absolute: 0.8 10*3/uL (ref 0.1–1.0)
Monocytes Relative: 9 % (ref 3–12)
NEUTROS ABS: 5 10*3/uL (ref 1.7–7.7)
NEUTROS PCT: 56 % (ref 43–77)
PLATELETS: 329 10*3/uL (ref 150–400)
RBC: 4.46 MIL/uL (ref 3.87–5.11)
RDW: 15.9 % — ABNORMAL HIGH (ref 11.5–15.5)
WBC: 9 10*3/uL (ref 4.0–10.5)

## 2013-11-30 NOTE — ED Notes (Signed)
Pt c/o generalized body aches x 2 days, pt states today she began having numbness to upper and lower extremities. Pt states she is in pain management and is getting no relief with percocet.

## 2013-11-30 NOTE — ED Notes (Signed)
Patient reports generalized pain x3 days, with numbness to hands, feet, legs x1 day. Rates pain 10/10. Denies SOB. Patient has Hx of fibromyalgia and states prescription pain meds have not been alleviating pain. Patient is A&O x4, speaking in complete sentences.

## 2013-12-01 DIAGNOSIS — IMO0001 Reserved for inherently not codable concepts without codable children: Secondary | ICD-10-CM | POA: Diagnosis not present

## 2013-12-01 LAB — CK: CK TOTAL: 241 U/L — AB (ref 7–177)

## 2013-12-01 LAB — BASIC METABOLIC PANEL
ANION GAP: 15 (ref 5–15)
BUN: 15 mg/dL (ref 6–23)
CO2: 26 mEq/L (ref 19–32)
Calcium: 9.6 mg/dL (ref 8.4–10.5)
Chloride: 96 mEq/L (ref 96–112)
Creatinine, Ser: 0.97 mg/dL (ref 0.50–1.10)
GFR, EST AFRICAN AMERICAN: 80 mL/min — AB (ref >90)
GFR, EST NON AFRICAN AMERICAN: 69 mL/min — AB (ref >90)
Glucose, Bld: 112 mg/dL — ABNORMAL HIGH (ref 70–99)
Potassium: 3.4 mEq/L — ABNORMAL LOW (ref 3.7–5.3)
SODIUM: 137 meq/L (ref 137–147)

## 2013-12-01 LAB — HEPATIC FUNCTION PANEL
ALT: 19 U/L (ref 0–35)
AST: 17 U/L (ref 0–37)
Albumin: 3.7 g/dL (ref 3.5–5.2)
Alkaline Phosphatase: 71 U/L (ref 39–117)
Bilirubin, Direct: <0.2 mg/dL (ref 0.0–0.3)
TOTAL PROTEIN: 8 g/dL (ref 6.0–8.3)
Total Bilirubin: <0.2 mg/dL — ABNORMAL LOW (ref 0.3–1.2)

## 2013-12-01 LAB — MAGNESIUM: MAGNESIUM: 1.7 mg/dL (ref 1.5–2.5)

## 2013-12-01 MED ORDER — KETOROLAC TROMETHAMINE 30 MG/ML IJ SOLN
60.0000 mg | Freq: Once | INTRAMUSCULAR | Status: AC
  Administered 2013-12-01: 60 mg via INTRAMUSCULAR
  Filled 2013-12-01: qty 2

## 2013-12-01 MED ORDER — MORPHINE SULFATE 4 MG/ML IJ SOLN
8.0000 mg | Freq: Once | INTRAMUSCULAR | Status: AC
  Administered 2013-12-01: 8 mg via INTRAMUSCULAR
  Filled 2013-12-01: qty 2

## 2013-12-01 NOTE — ED Provider Notes (Signed)
CSN: [id number]     Arrival date & time 11/30/13  2015 History   First MD Initiated Contact with Patient 12/01/13 0044     Chief Complaint  Patient presents with  . Generalized Body Aches     (Consider location/radiation/quality/duration/timing/severity/associated sxs/prior Treatment) HPI 48 year old female presents emergency part from home with complaint of general body pain worsening over the last several days along with a heaviness and numbness feeling to her arms and legs.  Patient denies fever or chills.  No nausea or vomiting.  Patient reports she has history of fibromyalgia, bulging disc, osteoarthritis.  Patient is followed by the [facility] pain clinic.  She reports she's been taking her liver and Percocet without improvement in her symptoms.  She has not contacted her pain clinic.  She denies any loss of function, no bowel or bladder incontinence.  Numbness is described as a dead feeling to her arms and legs.  She has sensation intact. Past Medical History  Diagnosis Date  . Diverticulitis   . Asthma   . Diabetes mellitus without complication   . Hypertension   . Fibromyalgia   . Lupus   . GERD (gastroesophageal reflux disease)   . Overactive bladder   . Migraine   . Morbid obesity 03/30/2013  . Pulmonary embolism     Age 82   Past Surgical History  Procedure Laterality Date  . Cholecystectomy    . Appendectomy    . Cesarean section  2001  . Rotator cuff repair    . Laminectomy    . Orif ankle fracture Left   . Dilation and curettage of uterus      x 3 for AUB  . Laparoscopic ovarian cystectomy Right 1994   No family history on file. History  Substance Use Topics  . Smoking status: Never Smoker   . Smokeless tobacco: Never Used  . Alcohol Use: No   OB History   Grav Para Term Preterm Abortions TAB SAB Ect Mult Living       Review of Systems  See History of Present Illness; otherwise all other systems are reviewed and negative   Allergies   Dilaudid; Levaquin; and Tangerine flavor  Home Medications   Prior to Admission medications   Medication Sig Start Date End Date Taking? Authorizing Provider  albuterol (PROVENTIL HFA;VENTOLIN HFA) 108 (90 BASE) MCG/ACT inhaler Inhale 2 puffs into the lungs 2 (two) times daily.   Yes Historical Provider, MD  budesonide-formoterol (SYMBICORT) 160-4.5 MCG/ACT inhaler Inhale 2 puffs into the lungs 2 (two) times daily.    Yes Historical Provider, MD  cyclobenzaprine (FLEXERIL) 10 MG tablet Take 10 mg by mouth daily as needed for muscle spasms.   Yes Historical Provider, MD  ferrous sulfate 325 (65 FE) MG tablet Take 325 mg by mouth 3 (three) times daily with meals.   Yes Historical Provider, MD  fluticasone (FLONASE) 50 MCG/ACT nasal spray Place 2 sprays into both nostrils daily as needed for allergies or rhinitis.   Yes Historical Provider, MD  glipiZIDE (GLUCOTROL) 10 MG tablet Take 10 mg by mouth 2 (two) times daily before a meal.    Yes Historical Provider, MD  HYDROcodone-acetaminophen (NORCO/VICODIN) 5-325 MG per tablet Take 1-2 tablets by mouth every 4 (four) hours as needed for moderate pain or severe pain. 09/23/13  Yes Candyce Churn III, MD  lisinopril-hydrochlorothiazide (PRINZIDE,ZESTORETIC) 20-12.5 MG per tablet Take 1 tablet by mouth daily.   Yes Historical  Provider, MD  megestrol (MEGACE) 40 MG tablet Take 2 tablets (80 mg total) by mouth 2 (two) times daily. 10/10/13  Yes Tereso Newcomer, MD  metFORMIN (GLUCOPHAGE) 500 MG tablet Take 1,000 mg by mouth 2 (two) times daily with a meal.    Yes Historical Provider, MD  omeprazole (PRILOSEC) 40 MG capsule Take 40 mg by mouth every evening.   Yes Historical Provider, MD  oxybutynin (DITROPAN-XL) 10 MG 24 hr tablet Take 20 mg by mouth daily.    Yes Historical Provider, MD  oxyCODONE-acetaminophen (PERCOCET) 7.5-325 MG per tablet Take 1 tablet by mouth every 8 (eight) hours as needed for pain.   Yes Historical Provider, MD  pregabalin  (LYRICA) 25 MG capsule Take 25 mg by mouth 3 (three) times daily.   Yes Historical Provider, MD  rizatriptan (MAXALT) 10 MG tablet Take 10 mg by mouth 2 (two) times daily as needed for migraine (migraine). May repeat in 2 hours if needed   Yes Historical Provider, MD  sitaGLIPtin (JANUVIA) 100 MG tablet Take 100 mg by mouth daily.   Yes Historical Provider, MD   BP 130/62  Pulse 83  Temp(Src) 98.4 F (36.9 C) (Oral)  Resp 18  Ht  (1.676 m)  Wt 348 lb (157.852 kg)  BMI 56.20 kg/m2  SpO2 96% Physical Exam  Nursing note and vitals reviewed. Constitutional: She is oriented to person, place, and time. She appears well-developed and well-nourished. She appears distressed.  Morbidly obese female, uncomfortable appearing  HENT:  Head: Normocephalic and atraumatic.  Nose: Nose normal.  Mouth/Throat: Oropharynx is clear and moist.  Eyes: Conjunctivae and EOM are normal. Pupils are equal, round, and reactive to light.  Neck: Normal range of motion. Neck supple. No JVD present. No tracheal deviation present. No thyromegaly present.  Cardiovascular: Normal rate, regular rhythm, normal heart sounds and intact distal pulses.  Exam reveals no gallop and no friction rub.   No murmur heard. Pulmonary/Chest: Effort normal and breath sounds normal. No stridor. No respiratory distress. She has no wheezes. She has no rales. She exhibits no tenderness.  Abdominal: Soft. Bowel sounds are normal. She exhibits no distension and no mass. There is no tenderness. There is no rebound and no guarding.  Musculoskeletal: Normal range of motion. She exhibits tenderness (patient is tender with palpation over every spot in her body.  It is worse overlarge muscle beds, calves and biceps..  Any range of motion causes pain). She exhibits no edema.  Lymphadenopathy:    She has no cervical adenopathy.  Neurological: She is alert and oriented to person, place, and time. No cranial nerve deficit. She exhibits normal muscle  tone. Coordination normal.  Skin: Skin is warm and dry. No rash noted. No erythema. No pallor.  Psychiatric: She has a normal mood and affect. Her behavior is normal. Judgment and thought content normal.    ED Course  Procedures (including critical care time) Labs Review Labs Reviewed  CBC WITH DIFFERENTIAL - Abnormal; Notable for the following:    Hemoglobin 11.0 (*)    HCT 34.8 (*)    MCH 24.7 (*)    RDW 15.9 (*)    All other components within normal limits  CK - Abnormal; Notable for the following:    Total CK 241 (*)    All other components within normal limits  BASIC METABOLIC PANEL - Abnormal; Notable for the following:    Potassium 3.4 (*)    Glucose, Bld 112 (*)  GFR calc non Af Amer 69 (*)    GFR calc Af Amer 80 (*)    All other components within normal limits  HEPATIC FUNCTION PANEL - Abnormal; Notable for the following:    Total Bilirubin <0.2 (*)    All other components within normal limits  MAGNESIUM    Imaging Review No results found.   EKG Interpretation   Date/Time:  Wednesday November 30 2013 20:48:56 EDT Ventricular Rate:  83 PR Interval:  149 QRS Duration: 91 QT Interval:  362 QTC Calculation: 425 R Axis:   39 Text Interpretation:  Sinus rhythm Low voltage, precordial leads Baseline  wander in lead(s) III Confirmed by WARD,  DO, KRISTEN ([id number]) on 11/30/2013  11:48:16 PM      MDM   Final diagnoses:  Myalgia    48 year old female with diffuse body pain.  Suspect fibromyalgia flare.  We'll try to get pain under better control here, and have her followup with her pain clinic.  No acute neurologic emergency noted.    Olivia Mackie, MD 12/01/13 [phone]

## 2013-12-01 NOTE — Discharge Instructions (Signed)
Your workup today does not show an acute cause for your pain.  Please continue with your current pain management.  Contact your pain clinic for further evaluation and treatment.   Fibromyalgia Fibromyalgia is a disorder that is often misunderstood. It is associated with muscular pains and tenderness that comes and goes. It is often associated with fatigue and sleep disturbances. Though it tends to be long-lasting, fibromyalgia is not life-threatening. CAUSES  The exact cause of fibromyalgia is unknown. People with certain gene types are predisposed to developing fibromyalgia and other conditions. Certain factors can play a role as triggers, such as:  Spine disorders.  Arthritis.  Severe injury (trauma) and other physical stressors.  Emotional stressors. SYMPTOMS   The main symptom is pain and stiffness in the muscles and joints, which can vary over time.  Sleep and fatigue problems. Other related symptoms may include:  Bowel and bladder problems.  Headaches.  Visual problems.  Problems with odors and noises.  Depression or mood changes.  Painful periods (dysmenorrhea).  Dryness of the skin or eyes. DIAGNOSIS  There are no specific tests for diagnosing fibromyalgia. Patients can be diagnosed accurately from the specific symptoms they have. The diagnosis is made by determining that nothing else is causing the problems. TREATMENT  There is no cure. Management includes medicines and an active, healthy lifestyle. The goal is to enhance physical fitness, decrease pain, and improve sleep. HOME CARE INSTRUCTIONS   Only take over-the-counter or prescription medicines as directed by your caregiver. Sleeping pills, tranquilizers, and pain medicines may make your problems worse.  Low-impact aerobic exercise is very important and advised for treatment. At first, it may seem to make pain worse. Gradually increasing your tolerance will overcome this feeling.  Learning relaxation  techniques and how to control stress will help you. Biofeedback, visual imagery, hypnosis, muscle relaxation, yoga, and meditation are all options.  Anti-inflammatory medicines and physical therapy may provide short-term help.  Acupuncture or massage treatments may help.  Take muscle relaxant medicines as suggested by your caregiver.  Avoid stressful situations.  Plan a healthy lifestyle. This includes your diet, sleep, rest, exercise, and friends.  Find and practice a hobby you enjoy.  Join a fibromyalgia support group for interaction, ideas, and sharing advice. This may be helpful. SEEK MEDICAL CARE IF:  You are not having good results or improvement from your treatment. FOR MORE INFORMATION  National Fibromyalgia Association: www.fmaware.org Arthritis Foundation: www.arthritis.org Document Released: 03/24/2005 Document Revised: 06/16/2011 Document Reviewed: 07/04/2009 [facility] Patient Information 2015 [place], [place]. This information is not intended to replace advice given to you by your health care provider. Make sure you discuss any questions you have with your health care provider.  Chronic Pain Chronic pain can be defined as pain that is off and on and lasts for 3-6 months or longer. Many things cause chronic pain, which can make it difficult to make a diagnosis. There are many treatment options available for chronic pain. However, finding a treatment that works well for you may require trying various approaches until the right one is found. Many people benefit from a combination of two or more types of treatment to control their pain. SYMPTOMS  Chronic pain can occur anywhere in the body and can range from mild to very severe. Some types of chronic pain include:  Headache.  Low back pain.  Cancer pain.  Arthritis pain.  Neurogenic pain. This is pain resulting from damage to nerves. People with chronic pain may also have other symptoms  such  as:  Depression.  Anger.  Insomnia.  Anxiety. DIAGNOSIS  Your health care provider will help diagnose your condition over time. In many cases, the initial focus will be on excluding possible conditions that could be causing the pain. Depending on your symptoms, your health care provider may order tests to diagnose your condition. Some of these tests may include:   Blood tests.   CT scan.   MRI.   X-rays.   Ultrasounds.   Nerve conduction studies.  You may need to see a specialist.  TREATMENT  Finding treatment that works well may take time. You may be referred to a pain specialist. He or she may prescribe medicine or therapies, such as:   Mindful meditation or yoga.  Shots (injections) of numbing or pain-relieving medicines into the spine or area of pain.  Local electrical stimulation.  Acupuncture.   Massage therapy.   Aroma, color, light, or sound therapy.   Biofeedback.   Working with a physical therapist to keep from getting stiff.   Regular, gentle exercise.   Cognitive or behavioral therapy.   Group support.  Sometimes, surgery may be recommended.  HOME CARE INSTRUCTIONS   Take all medicines as directed by your health care provider.   Lessen stress in your life by relaxing and doing things such as listening to calming music.   Exercise or be active as directed by your health care provider.   Eat a healthy diet and include things such as vegetables, fruits, fish, and lean meats in your diet.   Keep all follow-up appointments with your health care provider.   Attend a support group with others suffering from chronic pain. SEEK MEDICAL CARE IF:   Your pain gets worse.   You develop a new pain that was not there before.   You cannot tolerate medicines given to you by your health care provider.   You have new symptoms since your last visit with your health care provider.  SEEK IMMEDIATE MEDICAL CARE IF:   You feel weak.    You have decreased sensation or numbness.   You lose control of bowel or bladder function.   Your pain suddenly gets much worse.   You develop shaking.  You develop chills.  You develop confusion.  You develop chest pain.  You develop shortness of breath.  MAKE SURE YOU:  Understand these instructions.  Will watch your condition.  Will get help right away if you are not doing well or get worse. Document Released: 12/14/2001 Document Revised: 11/24/2012 Document Reviewed: 09/17/2012 [facility] Patient Information 2015 [place], [place]. This information is not intended to replace advice given to you by your health care provider. Make sure you discuss any questions you have with your health care provider.

## 2014-02-06 ENCOUNTER — Encounter (HOSPITAL_COMMUNITY): Payer: Self-pay | Admitting: Emergency Medicine

## 2014-02-11 ENCOUNTER — Other Ambulatory Visit: Payer: Self-pay | Admitting: Obstetrics & Gynecology

## 2014-02-21 ENCOUNTER — Telehealth: Payer: Self-pay | Admitting: *Deleted

## 2014-02-21 NOTE — Telephone Encounter (Signed)
Pt has called the clinic stating she is bleeding for the past three weeks.    Contacted patient, pt states she is bleeding heavily for the past 3 wks with break through pain.  Pt states she is tired of pain/bleeding. Appointment made for patient 02/22/2014 @ 3pm with Dr. Erin FullingHarraway-Smith.  Pt verbalizes understanding.

## 2014-02-22 ENCOUNTER — Encounter: Payer: Self-pay | Admitting: Obstetrics & Gynecology

## 2014-02-22 ENCOUNTER — Ambulatory Visit (INDEPENDENT_AMBULATORY_CARE_PROVIDER_SITE_OTHER): Payer: Medicare Other | Admitting: Obstetrics & Gynecology

## 2014-02-22 VITALS — BP 134/89 | HR 106 | Temp 98.5°F | Wt 352.0 lb

## 2014-02-22 DIAGNOSIS — R102 Pelvic and perineal pain: Secondary | ICD-10-CM

## 2014-02-22 NOTE — Progress Notes (Signed)
Subjective:     Patient ID: Angelica Ortiz, female   DOB: 11/07/1965, 48 y.o.   MRN: [id number]  HPI Pt reports that despite the Megace she is having heavy bleeding that has become unbearable for her. She also reports having anemia due to sx.  Now on Megace 80mg  daily but, having daily bleeding.   Review of Systems     Objective:   Physical Exam BP 134/89 mmHg  Pulse 106  Temp(Src) 98.5 F (36.9 C) (Oral)  Wt 352 lb (159.666 kg) Exam deferred  05/12/2013 CLINICAL DATA: Menorrhagia. Followup left tubal ovarian abscess.  EXAM: TRANSVAGINAL ULTRASOUND OF PELVIS  TECHNIQUE: Transvaginal ultrasound examination of the pelvis was performed including evaluation of the uterus, ovaries, adnexal regions, and pelvic cul-de-sac.  COMPARISON: CT, 04/01/2013. Pelvic ultrasound, 03/29/2013.  FINDINGS: Uterus  Measurements: 12.5 cm x 7.0 cm x 8.3 cm. No fibroids or other mass visualized. Multiple cervical nabothian cysts.  Endometrium  Thickness: 10 mm. No focal abnormality visualized.  Right ovary  Measurements: 23 mm x 20 mm x 20 mm. Normal appearance/no adnexal mass.  Left ovary  Measurements: 6 cm x 4.8 cm x 5.5 cm. Complex cystic mass measuring 4.3 cm x 3.5 cm x 2.9 cm rises on the left ovary. This is avascular. This has decreased in size from the prior study where it measured 10.8 cm. It is less complex.  Other findings: No free fluid  IMPRESSION: 1. Significant decrease in the size and complexity of a complex left ovarian cystic mass, now measuring 4.3 cm in greatest dimension. 2. Normal uterus and right ovary. No free fluid. No new abnormalities.  06/06/2013 Diagnosis Endometrium, biopsy - FRAGMENTS OF BENIGN POLYPOID ENDOMETRIAL TISSUE WITH PROGESTERONE INDUCED STROMA CHANGE. - NEGATIVE FOR HYPERPLASIA OR MALIGNANCY. - BENIGN ENDOCERVICAL MUCOSA.    Assessment:     Menorrhagia- pt wants definitive treatment.  Reviewed with pt treatment  options.  She is a poor surgical candidate and agrees with endometrial ablation.     Plan:     Patient desires surgical management with hysteroscopy with endometrial ablation using Novasure.  The risks of surgery were discussed in detail with the patient including but not limited to: bleeding which may require transfusion or reoperation; infection which may require prolonged hospitalization or re-hospitalization and antibiotic therapy; injury to bowel, bladder, ureters and major vessels or other surrounding organs; need for additional procedures including laparotomy; thromboembolic phenomenon, incisional problems and other postoperative or anesthesia complications.  Patient was told that the likelihood that her condition and symptoms will be treated effectively with this surgical management was very high; the postoperative expectations were also discussed in detail. The patient also understands the alternative treatment options which were discussed in full. All questions were answered.  She was told that she will be contacted by our surgical scheduler regarding the time and date of her surgery; routine preoperative instructions of having nothing to eat or drink after midnight on the day prior to surgery and also coming to the hospital 1 1/2 hours prior to her time of surgery were also emphasized.  She was told she may be called for a preoperative appointment about a week prior to surgery and will be given further preoperative instructions at that visit. Printed patient education handouts about the procedure were given to the patient to review at home.

## 2014-02-22 NOTE — Patient Instructions (Signed)
Endometrial Ablation Endometrial ablation removes the lining of the uterus (endometrium). It is usually a same-day, outpatient treatment. Ablation helps avoid major surgery, such as surgery to remove the cervix and uterus (hysterectomy). After endometrial ablation, you will have little or no menstrual bleeding and may not be able to have children. However, if you are premenopausal, you will need to use a reliable method of birth control following the procedure because of the small chance that pregnancy can occur. There are different reasons to have this procedure, which include:  Heavy periods.  Bleeding that is causing anemia.  Irregular bleeding.  Bleeding fibroids on the lining inside the uterus if they are smaller than 3 centimeters. This procedure should not be done if:  You want children in the future.  You have severe cramps with your menstrual period.  You have precancerous or cancerous cells in your uterus.  You were recently pregnant.  You have gone through menopause.  You have had major surgery on the uterus, such as a cesarean delivery. LET YOUR HEALTH CARE PROVIDER KNOW ABOUT:  Any allergies you have.  All medicines you are taking, including vitamins, herbs, eye drops, creams, and over-the-counter medicines.  Previous problems you or members of your family have had with the use of anesthetics.  Any blood disorders you have.  Previous surgeries you have had.  Medical conditions you have. RISKS AND COMPLICATIONS  Generally, this is a safe procedure. However, as with any procedure, complications can occur. Possible complications include:  Perforation of the uterus.  Bleeding.  Infection of the uterus, bladder, or vagina.  Injury to surrounding organs.  An air bubble to the lung (air embolus).  Pregnancy following the procedure.  Failure of the procedure to help the problem, requiring hysterectomy.  Decreased ability to diagnose cancer in the lining of  the uterus. BEFORE THE PROCEDURE  The lining of the uterus must be tested to make sure there is no pre-cancerous or cancer cells present.  An ultrasound may be performed to look at the size of the uterus and to check for abnormalities.  Medicines may be given to thin the lining of the uterus. PROCEDURE  During the procedure, your health care provider will use a tool called a resectoscope to help see inside your uterus. There are different ways to remove the lining of your uterus.   Radiofrequency - This method uses a radiofrequency-alternating electric current to remove the lining of the uterus.  Cryotherapy - This method uses extreme cold to freeze the lining of the uterus.  Heated-Free Liquid - This method uses heated salt (saline) solution to remove the lining of the uterus.  Microwave - This method uses high-energy microwaves to heat up the lining of the uterus to remove it.  Thermal balloon - This method involves inserting a catheter with a balloon tip into the uterus. The balloon tip is filled with heated fluid to remove the lining of the uterus. AFTER THE PROCEDURE  After your procedure, do not have sexual intercourse or insert anything into your vagina until permitted by your health care provider. After the procedure, you may experience:  Cramps.  Vaginal discharge.  Frequent urination. Document Released: 02/01/2004 Document Revised: 11/24/2012 Document Reviewed: 08/25/2012 [employer] Patient Information 2015 [employer]. This information is not intended to replace advice given to you by your health care provider. Make sure you discuss any questions you have with your health care provider.  

## 2014-02-28 ENCOUNTER — Other Ambulatory Visit: Payer: Self-pay | Admitting: Obstetrics & Gynecology

## 2014-02-28 ENCOUNTER — Ambulatory Visit (HOSPITAL_COMMUNITY)
Admission: RE | Admit: 2014-02-28 | Discharge: 2014-02-28 | Disposition: A | Discharge status: Home / Self Care | Payer: Medicare Other | Source: Ambulatory Visit | Attending: Obstetrics & Gynecology | Admitting: Obstetrics & Gynecology

## 2014-02-28 DIAGNOSIS — R102 Pelvic and perineal pain: Secondary | ICD-10-CM

## 2014-02-28 DIAGNOSIS — D649 Anemia, unspecified: Secondary | ICD-10-CM | POA: Diagnosis present

## 2014-02-28 DIAGNOSIS — N939 Abnormal uterine and vaginal bleeding, unspecified: Secondary | ICD-10-CM | POA: Insufficient documentation

## 2014-03-06 ENCOUNTER — Telehealth: Payer: Self-pay

## 2014-03-06 NOTE — Telephone Encounter (Signed)
Patient called requesting results of U/S completed last week. Called patient and informed her of results. Patient verbalized understanding and gratitude. No questions or concerns.

## 2014-03-08 NOTE — Patient Instructions (Addendum)
   Your procedure is scheduled on:  Tuesday. Dec 8  [facility] at: 1 PM Pick up the phone at the desk and dial [phone] and inform us of your arrival.  Please call this number if you have any problems the morning of surgery: [phone]  Remember: Do not eat food after midnight: Monday Do not drink clear liquids after: 10:30 Am Tuesday, day of surgery Take these medicines the morning of surgery with a SIP OF WATER: Use symbicort inhaler and flonase nasal spray, lyrica.  Do not take any diabetes medicine on day of surgery.  Patient instructed to withhold  Monday night dose of metformin. We will check your sugar upon arrival to Short Stay Department.  Do not wear jewelry, make-up, or FINGER nail polish No metal in your hair or on your body. Do not wear lotions, powders, perfumes.  You may wear deodorant.  Do not bring valuables to the hospital. Contacts, dentures or bridgework may not be worn into surgery.  Patients discharged on the day of surgery will not be allowed to drive home.  Home with husband "Ell" Rush.

## 2014-03-09 ENCOUNTER — Encounter (HOSPITAL_COMMUNITY): Payer: Self-pay

## 2014-03-09 ENCOUNTER — Encounter (HOSPITAL_COMMUNITY)
Admission: RE | Admit: 2014-03-09 | Discharge: 2014-03-09 | Disposition: A | Discharge status: Home / Self Care | Payer: Medicare Other | Source: Ambulatory Visit | Attending: Obstetrics & Gynecology | Admitting: Obstetrics & Gynecology

## 2014-03-09 DIAGNOSIS — Z01812 Encounter for preprocedural laboratory examination: Secondary | ICD-10-CM | POA: Insufficient documentation

## 2014-03-09 HISTORY — DX: Other seasonal allergic rhinitis: J30.2

## 2014-03-09 HISTORY — DX: Anemia, unspecified: D64.9

## 2014-03-09 HISTORY — DX: Sleep apnea, unspecified: G47.30

## 2014-03-09 LAB — BASIC METABOLIC PANEL
Anion gap: 12 (ref 5–15)
BUN: 17 mg/dL (ref 6–23)
CO2: 27 mEq/L (ref 19–32)
CREATININE: 0.82 mg/dL (ref 0.50–1.10)
Calcium: 9.8 mg/dL (ref 8.4–10.5)
Chloride: 100 mEq/L (ref 96–112)
GFR, EST NON AFRICAN AMERICAN: 85 mL/min — AB (ref >90)
Glucose, Bld: 126 mg/dL — ABNORMAL HIGH (ref 70–99)
POTASSIUM: 4.3 meq/L (ref 3.7–5.3)
Sodium: 139 mEq/L (ref 137–147)

## 2014-03-09 LAB — CBC
HCT: 31 % — ABNORMAL LOW (ref 36.0–46.0)
Hemoglobin: 9.6 g/dL — ABNORMAL LOW (ref 12.0–15.0)
MCH: 22.7 pg — ABNORMAL LOW (ref 26.0–34.0)
MCHC: 31 g/dL (ref 30.0–36.0)
MCV: 73.3 fL — ABNORMAL LOW (ref 78.0–100.0)
PLATELETS: 375 10*3/uL (ref 150–400)
RBC: 4.23 MIL/uL (ref 3.87–5.11)
RDW: 17.7 % — AB (ref 11.5–15.5)
WBC: 8.3 10*3/uL (ref 4.0–10.5)

## 2014-03-14 ENCOUNTER — Ambulatory Visit (HOSPITAL_COMMUNITY)
Admission: RE | Admit: 2014-03-14 | Discharge: 2014-03-14 | Disposition: A | Discharge status: Home / Self Care | Payer: Medicare Other | Source: Ambulatory Visit | Attending: Obstetrics & Gynecology | Admitting: Obstetrics & Gynecology

## 2014-03-14 ENCOUNTER — Ambulatory Visit (HOSPITAL_COMMUNITY): Payer: Medicare Other | Admitting: Certified Registered Nurse Anesthetist

## 2014-03-14 ENCOUNTER — Encounter (HOSPITAL_COMMUNITY): Payer: Self-pay

## 2014-03-14 ENCOUNTER — Encounter (HOSPITAL_COMMUNITY)
Admission: RE | Disposition: A | Discharge status: Home / Self Care | Payer: Self-pay | Source: Ambulatory Visit | Attending: Obstetrics & Gynecology

## 2014-03-14 DIAGNOSIS — N8320 Unspecified ovarian cysts: Secondary | ICD-10-CM | POA: Insufficient documentation

## 2014-03-14 DIAGNOSIS — M329 Systemic lupus erythematosus, unspecified: Secondary | ICD-10-CM | POA: Insufficient documentation

## 2014-03-14 DIAGNOSIS — N920 Excessive and frequent menstruation with regular cycle: Secondary | ICD-10-CM | POA: Diagnosis present

## 2014-03-14 DIAGNOSIS — K219 Gastro-esophageal reflux disease without esophagitis: Secondary | ICD-10-CM | POA: Insufficient documentation

## 2014-03-14 DIAGNOSIS — M797 Fibromyalgia: Secondary | ICD-10-CM | POA: Diagnosis not present

## 2014-03-14 DIAGNOSIS — G43909 Migraine, unspecified, not intractable, without status migrainosus: Secondary | ICD-10-CM | POA: Diagnosis not present

## 2014-03-14 DIAGNOSIS — N939 Abnormal uterine and vaginal bleeding, unspecified: Secondary | ICD-10-CM

## 2014-03-14 DIAGNOSIS — Z79899 Other long term (current) drug therapy: Secondary | ICD-10-CM | POA: Diagnosis not present

## 2014-03-14 DIAGNOSIS — G4730 Sleep apnea, unspecified: Secondary | ICD-10-CM | POA: Insufficient documentation

## 2014-03-14 DIAGNOSIS — Z791 Long term (current) use of non-steroidal anti-inflammatories (NSAID): Secondary | ICD-10-CM | POA: Insufficient documentation

## 2014-03-14 DIAGNOSIS — J45909 Unspecified asthma, uncomplicated: Secondary | ICD-10-CM | POA: Insufficient documentation

## 2014-03-14 DIAGNOSIS — E119 Type 2 diabetes mellitus without complications: Secondary | ICD-10-CM | POA: Insufficient documentation

## 2014-03-14 DIAGNOSIS — I10 Essential (primary) hypertension: Secondary | ICD-10-CM | POA: Insufficient documentation

## 2014-03-14 DIAGNOSIS — Z6841 Body Mass Index (BMI) 40.0 and over, adult: Secondary | ICD-10-CM | POA: Diagnosis not present

## 2014-03-14 DIAGNOSIS — D649 Anemia, unspecified: Secondary | ICD-10-CM | POA: Insufficient documentation

## 2014-03-14 HISTORY — PX: HYSTEROSCOPY WITH NOVASURE: SHX5574

## 2014-03-14 LAB — GLUCOSE, CAPILLARY
Glucose-Capillary: 89 mg/dL (ref 70–99)
Glucose-Capillary: 81 mg/dL (ref 70–99)

## 2014-03-14 LAB — PREGNANCY, URINE: PREG TEST UR: NEGATIVE

## 2014-03-14 SURGERY — HYSTEROSCOPY WITH NOVASURE
Anesthesia: Spinal | Site: Vagina

## 2014-03-14 MED ORDER — BUPIVACAINE HCL (PF) 0.5 % IJ SOLN
INTRAMUSCULAR | Status: DC | PRN
  Administered 2014-03-14: 10 mL

## 2014-03-14 MED ORDER — BUPIVACAINE HCL (PF) 0.5 % IJ SOLN
INTRAMUSCULAR | Status: AC
  Filled 2014-03-14: qty 30

## 2014-03-14 MED ORDER — KETOROLAC TROMETHAMINE 30 MG/ML IJ SOLN
INTRAMUSCULAR | Status: DC | PRN
  Administered 2014-03-14: 30 mg via INTRAVENOUS

## 2014-03-14 MED ORDER — SCOPOLAMINE 1 MG/3DAYS TD PT72
MEDICATED_PATCH | TRANSDERMAL | Status: AC
  Filled 2014-03-14: qty 1

## 2014-03-14 MED ORDER — MIDAZOLAM HCL 5 MG/5ML IJ SOLN
INTRAMUSCULAR | Status: DC | PRN
  Administered 2014-03-14: 2 mg via INTRAVENOUS

## 2014-03-14 MED ORDER — LACTATED RINGERS IV SOLN
INTRAVENOUS | Status: DC | PRN
  Administered 2014-03-14: 1000 mL via INTRAUTERINE

## 2014-03-14 MED ORDER — MIDAZOLAM HCL 2 MG/2ML IJ SOLN
INTRAMUSCULAR | Status: AC
  Filled 2014-03-14: qty 2

## 2014-03-14 MED ORDER — IBUPROFEN 600 MG PO TABS: 600.0000 mg | ORAL_TABLET | Freq: Four times a day (QID) | ORAL | Status: DC | PRN

## 2014-03-14 MED ORDER — ACETAMINOPHEN 160 MG/5ML PO SOLN
ORAL | Status: AC
  Filled 2014-03-14: qty 40.6

## 2014-03-14 MED ORDER — SCOPOLAMINE 1 MG/3DAYS TD PT72
1.0000 | MEDICATED_PATCH | Freq: Once | TRANSDERMAL | Status: AC
  Administered 2014-03-14: 1.5 mg via TRANSDERMAL
  Administered 2014-03-14: 1 via TRANSDERMAL

## 2014-03-14 MED ORDER — LACTATED RINGERS IV SOLN
INTRAVENOUS | Status: DC
  Administered 2014-03-14: 13:00:00 via INTRAVENOUS

## 2014-03-14 MED ORDER — PROPOFOL INFUSION 10 MG/ML OPTIME
INTRAVENOUS | Status: DC | PRN
  Administered 2014-03-14: 30 ug/kg/min via INTRAVENOUS

## 2014-03-14 MED ORDER — FENTANYL CITRATE 0.05 MG/ML IJ SOLN
25.0000 ug | INTRAMUSCULAR | Status: DC | PRN
  Administered 2014-03-14: 25 ug via INTRAVENOUS
  Administered 2014-03-14: 50 ug via INTRAVENOUS
  Administered 2014-03-14: 25 ug via INTRAVENOUS

## 2014-03-14 MED ORDER — FENTANYL CITRATE 0.05 MG/ML IJ SOLN
INTRAMUSCULAR | Status: DC | PRN
  Administered 2014-03-14: 50 ug via INTRAVENOUS

## 2014-03-14 MED ORDER — FENTANYL CITRATE 0.05 MG/ML IJ SOLN
INTRAMUSCULAR | Status: AC
  Filled 2014-03-14: qty 2

## 2014-03-14 MED ORDER — ACETAMINOPHEN 160 MG/5ML PO SOLN
960.0000 mg | Freq: Once | ORAL | Status: AC
  Administered 2014-03-14: 960 mg via ORAL

## 2014-03-14 MED ORDER — FENTANYL CITRATE 0.05 MG/ML IJ SOLN
INTRAMUSCULAR | Status: AC
  Administered 2014-03-14: 50 ug via INTRAVENOUS
  Filled 2014-03-14: qty 2

## 2014-03-14 MED ORDER — LIDOCAINE IN DEXTROSE 5-7.5 % IV SOLN
INTRAVENOUS | Status: AC
  Filled 2014-03-14: qty 2

## 2014-03-14 MED ORDER — PROPOFOL 10 MG/ML IV EMUL
INTRAVENOUS | Status: AC
  Filled 2014-03-14: qty 50

## 2014-03-14 MED ORDER — LACTATED RINGERS IV SOLN: INTRAVENOUS | Status: DC

## 2014-03-14 SURGICAL SUPPLY — 15 items
ABLATOR ENDOMETRIAL BIPOLAR (ABLATOR) ×3 IMPLANT
CATH ROBINSON RED A/P 16FR (CATHETERS) ×3 IMPLANT
CLOTH BEACON ORANGE TIMEOUT ST (SAFETY) ×3 IMPLANT
CONTAINER PREFILL 10% NBF 60ML (FORM) ×3 IMPLANT
DRAPE HYSTEROSCOPY (DRAPE) ×3 IMPLANT
DRSG TELFA 3X8 NADH (GAUZE/BANDAGES/DRESSINGS) ×3 IMPLANT
GLOVE BIO SURGEON STRL SZ7 (GLOVE) ×3 IMPLANT
GLOVE BIOGEL PI IND STRL 7.0 (GLOVE) ×1 IMPLANT
GLOVE BIOGEL PI INDICATOR 7.0 (GLOVE) ×2
GOWN STRL REUS W/TWL LRG LVL3 (GOWN DISPOSABLE) ×6 IMPLANT
PACK VAGINAL MINOR WOMEN LF (CUSTOM PROCEDURE TRAY) ×3 IMPLANT
PAD OB MATERNITY 4.3X12.25 (PERSONAL CARE ITEMS) ×3 IMPLANT
TOWEL OR 17X24 6PK STRL BLUE (TOWEL DISPOSABLE) ×6 IMPLANT
TUBING AQUILEX INFLOW (TUBING) ×3 IMPLANT
WATER STERILE IRR 1000ML POUR (IV SOLUTION) ×3 IMPLANT

## 2014-03-14 NOTE — Anesthesia Preprocedure Evaluation (Signed)
Anesthesia Evaluation  Patient identified by MRN, date of birth, ID band Patient awake    Reviewed: Allergy & Precautions, H&P , Patient's Chart, lab work & pertinent test results  Airway Mallampati: II  TM Distance: >3 FB Neck ROM: full    Dental no notable dental hx.    Pulmonary asthma , sleep apnea ,  breath sounds clear to auscultation  Pulmonary exam normal       Cardiovascular Exercise Tolerance: Good hypertension, On Medications Rhythm:regular Rate:Normal     Neuro/Psych    GI/Hepatic GERD-  ,  Endo/Other  diabetesMorbid obesity  Renal/GU      Musculoskeletal   Abdominal   Peds  Hematology   Anesthesia Other Findings No anticoagulants for years OSA-  Reccommended, not yet acquired CPAP mask Last GA, Anesthesiologist gave her special instructions for making intubation easier, ( ramping, etc) Am BS 89; no insulin MO HTN. Uses inhaler daily for asthma  Reproductive/Obstetrics                             Anesthesia Physical Anesthesia Plan  ASA: III  Anesthesia Plan: Spinal   Post-op Pain Management:    Induction:   Airway Management Planned:   Additional Equipment:   Intra-op Plan:   Post-operative Plan:   Informed Consent: I have reviewed the patients History and Physical, chart, labs and discussed the procedure including the risks, benefits and alternatives for the proposed anesthesia with the patient or authorized representative who has indicated his/her understanding and acceptance.   Dental Advisory Given  Plan Discussed with: CRNA  Anesthesia Plan Comments: (With MO, and possible Hx of DI. I think SAB is safest choice to start. Hx of Lum Lam, should be able to go one level above.  Lab work confirmed with CRNA in room. Platelets okay. Discussed spinal anesthetic, and patient consents to the procedure:  included risk of possible headache,backache, failed block,  allergic reaction, and nerve injury. This patient was asked if she had any questions or concerns before the procedure started. )        Anesthesia Quick Evaluation

## 2014-03-14 NOTE — Brief Op Note (Signed)
03/14/2014  3:01 PM  PATIENT:  Angelica Ortiz  48 y.o. female  PRE-OPERATIVE DIAGNOSIS:  cpt [id number] - Menorrhagia  POST-OPERATIVE DIAGNOSIS:  cpt [id number] - Menorrhagia; Abnormal Uterine Bleeding  PROCEDURE:  Procedure(s): HYSTEROSCOPY WITH NOVASURE (N/A)  SURGEON:  Surgeon(s) and Role:    * Willodean Rosenthalarolyn Harraway-Smith, MD - Primary  ANESTHESIA:   spinal  EBL:  Total I/O In: 1000 [I.V.:1000] Out: -   BLOOD ADMINISTERED:none  DRAINS: none   LOCAL MEDICATIONS USED:  MARCAINE     SPECIMEN:  Source of Specimen:  endometrial currettings  DISPOSITION OF SPECIMEN:  PATHOLOGY  COUNTS:  YES  TOURNIQUET:  * No tourniquets in log *  DICTATION: .Note written in EPIC  PLAN OF CARE: Discharge to home after PACU  PATIENT DISPOSITION:  PACU - hemodynamically stable.   Delay start of Pharmacological VTE agent (>24hrs) due to surgical blood loss or risk of bleeding: not applicable

## 2014-03-14 NOTE — Anesthesia Postprocedure Evaluation (Signed)
  Anesthesia Post-op Note  Anesthesia Post Note  Patient: Angelica Ortiz  Procedure(s) Performed: Procedure(s) (LRB): HYSTEROSCOPY WITH NOVASURE (N/A)  Anesthesia type: Spinal  Patient location: PACU  Post pain: Pain level controlled  Post assessment: Post-op Vital signs reviewed  Last Vitals:  Filed Vitals:   03/14/14 1645  BP: 133/83  Pulse: 94  Temp:   Resp: 17    Post vital signs: Reviewed  Level of consciousness: awake  Complications: No apparent anesthesia complications

## 2014-03-14 NOTE — H&P (Signed)
HPI Pt reports that despite the Megace she is having heavy bleeding that has become unbearable for her. She also reports having anemia due to sx. Now on Megace 80mg  daily but, having daily bleeding.  Past Medical History  Diagnosis Date  . Diverticulitis   . Asthma   . Hypertension   . Fibromyalgia   . Lupus     no meds  . GERD (gastroesophageal reflux disease)   . Overactive bladder   . Migraine   . Morbid obesity 03/30/2013  . Pulmonary embolism     Resolved -At age 48 broken right leg, blood clot broke off - into lung  . Diabetes mellitus without complication     Type 2  . Anemia   . Overactive bladder   . Seasonal allergies   . Sleep apnea 3/15    Does not have CPAP machine   Past Surgical History  Procedure Laterality Date  . Cholecystectomy    . Appendectomy    . Cesarean section  2001    x 1  . Rotator cuff repair      right  . Laminectomy      L4-L5  . Orif ankle fracture Left   . Dilation and curettage of uterus      x 3 for AUB  . Laparoscopic ovarian cystectomy Right 1994  . Tonsillectomy     No current facility-administered medications on file prior to encounter.   Current Outpatient Prescriptions on File Prior to Encounter  Medication Sig Dispense Refill  . albuterol (PROVENTIL HFA;VENTOLIN HFA) 108 (90 BASE) MCG/ACT inhaler Inhale 2 puffs into the lungs 2 (two) times daily.    . budesonide-formoterol (SYMBICORT) 160-4.5 MCG/ACT inhaler Inhale 2 puffs into the lungs 2 (two) times daily.     . cyclobenzaprine (FLEXERIL) 10 MG tablet Take 10 mg by mouth daily as needed for muscle spasms.    . ferrous sulfate 325 (65 FE) MG tablet Take 325 mg by mouth 3 (three) times daily with meals.    . fluticasone (FLONASE) 50 MCG/ACT nasal spray Place 2 sprays into both nostrils daily as needed for allergies or rhinitis.    Marland Kitchen. glipiZIDE (GLUCOTROL) 10 MG tablet Take 10 mg by mouth 2 (two) times daily before a meal.     . lisinopril-hydrochlorothiazide  (PRINZIDE,ZESTORETIC) 20-12.5 MG per tablet Take 1 tablet by mouth at bedtime.     . megestrol (MEGACE) 40 MG tablet TAKE 2 TABLETS BY MOUTH TWICE DAILY 120 tablet 0  . montelukast (SINGULAIR) 10 MG tablet Take 10 mg by mouth at bedtime.    Marland Kitchen. omeprazole (PRILOSEC) 40 MG capsule Take 40 mg by mouth every evening.    Marland Kitchen. oxybutynin (DITROPAN-XL) 10 MG 24 hr tablet Take 20 mg by mouth at bedtime.     Marland Kitchen. oxyCODONE-acetaminophen (PERCOCET) 7.5-325 MG per tablet Take 1 tablet by mouth every 8 (eight) hours as needed for pain.    . rizatriptan (MAXALT) 10 MG tablet Take 10 mg by mouth 2 (two) times daily as needed for migraine (migraine). May repeat in 2 hours if needed    . sitaGLIPtin (JANUVIA) 100 MG tablet Take 100 mg by mouth daily.    Marland Kitchen. HYDROcodone-acetaminophen (NORCO/VICODIN) 5-325 MG per tablet Take 1-2 tablets by mouth every 4 (four) hours as needed for moderate pain or severe pain. (Patient not taking: Reported on 02/27/2014) 6 tablet 0   Allergies  Allergen Reactions  . Dilaudid [Hydromorphone Hcl] Hives and Itching  . Levaquin [Levofloxacin In D5w] Hives,  Shortness Of Breath and Itching  . Tangerine Flavor Swelling    Lip and tongue swelling with tangerines   History   Social History  . Marital Status: Married    Spouse Name: N/A    Number of Children: N/A  . Years of Education: N/A   Occupational History  . Not on file.   Social History Main Topics  . Smoking status: Never Smoker   . Smokeless tobacco: Never Used  . Alcohol Use: No  . Drug Use: No  . Sexual Activity: Yes    Birth Control/ Protection: None   Other Topics Concern  . Not on file   Social History Narrative        Review of Systems     Objective:   Physical Exam BP 148/87 mmHg  Pulse 91  Temp(Src) 98.4 F (36.9 C) (Oral)  Resp 20  SpO2 98% Pt in NAD Lungs: CTA CV: RRR Abd: obese, NT, ND  CBC    Component Value Date/Time   WBC 8.3 03/09/2014 1200   RBC 4.23 03/09/2014 1200   HGB 9.6*  03/09/2014 1200   HCT 31.0* 03/09/2014 1200   PLT 375 03/09/2014 1200   MCV 73.3* 03/09/2014 1200   MCH 22.7* 03/09/2014 1200   MCHC 31.0 03/09/2014 1200   RDW 17.7* 03/09/2014 1200   LYMPHSABS 2.9 11/30/2013 2300   MONOABS 0.8 11/30/2013 2300   EOSABS 0.3 11/30/2013 2300   BASOSABS 0.0 11/30/2013 2300     05/12/2013 CLINICAL DATA: Menorrhagia. Followup left tubal ovarian abscess.  EXAM: TRANSVAGINAL ULTRASOUND OF PELVIS  TECHNIQUE: Transvaginal ultrasound examination of the pelvis was performed including evaluation of the uterus, ovaries, adnexal regions, and pelvic cul-de-sac.  COMPARISON: CT, 04/01/2013. Pelvic ultrasound, 03/29/2013.  FINDINGS: Uterus  Measurements: 12.5 cm x 7.0 cm x 8.3 cm. No fibroids or other mass visualized. Multiple cervical nabothian cysts.  Endometrium  Thickness: 10 mm. No focal abnormality visualized.  Right ovary  Measurements: 23 mm x 20 mm x 20 mm. Normal appearance/no adnexal mass.  Left ovary  Measurements: 6 cm x 4.8 cm x 5.5 cm. Complex cystic mass measuring 4.3 cm x 3.5 cm x 2.9 cm rises on the left ovary. This is avascular. This has decreased in size from the prior study where it measured 10.8 cm. It is less complex.  Other findings: No free fluid  IMPRESSION: 1. Significant decrease in the size and complexity of a complex left ovarian cystic mass, now measuring 4.3 cm in greatest dimension. 2. Normal uterus and right ovary. No free fluid. No new abnormalities.  06/06/2013 Diagnosis Endometrium, biopsy - FRAGMENTS OF BENIGN POLYPOID ENDOMETRIAL TISSUE WITH PROGESTERONE INDUCED STROMA CHANGE. - NEGATIVE FOR HYPERPLASIA OR MALIGNANCY. - BENIGN ENDOCERVICAL MUCOSA.    Assessment:     Menorrhagia- pt wants definitive treatment. Reviewed with pt treatment options. She is a poor surgical candidate and agrees with endometrial ablation.     Plan:     Patient desires surgical management with  hysteroscopy with endometrial ablation using Novasure. The risks of surgery were discussed in detail with the patient including but not limited to: bleeding which may require transfusion or reoperation; infection which may require prolonged hospitalization or re-hospitalization and antibiotic therapy; injury to bowel, bladder, ureters and major vessels or other surrounding organs; need for additional procedures including laparotomy; thromboembolic phenomenon, incisional problems and other postoperative or anesthesia complications. Patient was told that the likelihood that her condition and symptoms will be treated effectively with this surgical management was  very high; the postoperative expectations were also discussed in detail. The patient also understands the alternative treatment options which were discussed in full. All questions were answered.

## 2014-03-14 NOTE — Discharge Instructions (Signed)
Hysteroscopy, Care After Refer to this sheet in the next few weeks. These instructions provide you with information on caring for yourself after your procedure. Your health care provider may also give you more specific instructions. Your treatment has been planned according to current medical practices, but problems sometimes occur. Call your health care provider if you have any problems or questions after your procedure.  WHAT TO EXPECT AFTER THE PROCEDURE After your procedure, it is typical to have the following:  You may have some cramping. This normally lasts for a couple days.  You may have bleeding. This can vary from light spotting for a few days to menstrual-like bleeding for 3-7 days. HOME CARE INSTRUCTIONS  Rest for the first 1-2 days after the procedure.  Only take over-the-counter or prescription medicines as directed by your health care provider. Do not take aspirin. It can increase the chances of bleeding.  Take showers instead of baths for 2 weeks or as directed by your health care provider.  Do not drive for 24 hours or as directed.  Do not drink alcohol while taking pain medicine.  Do not use tampons, douche, or have sexual intercourse for 2 weeks or until your health care provider says it is okay.  Take your temperature twice a day for 4-5 days. Write it down each time.  Follow your health care provider's advice about diet, exercise, and lifting.  If you develop constipation, you may:  Take a mild laxative if your health care provider approves.  Add bran foods to your diet.  Drink enough fluids to keep your urine clear or pale yellow.  Try to have someone with you or available to you for the first 24-48 hours, especially if you were given a general anesthetic.  Follow up with your health care provider as directed. SEEK MEDICAL CARE IF:  You feel dizzy or lightheaded.  You feel sick to your stomach (nauseous).  You have abnormal vaginal discharge.  You  have a rash.  You have pain that is not controlled with medicine. SEEK IMMEDIATE MEDICAL CARE IF:  You have bleeding that is heavier than a normal menstrual period.  You have a fever.  You have increasing cramps or pain, not controlled with medicine.  You have new belly (abdominal) pain.  You pass out.  You have pain in the tops of your shoulders (shoulder strap areas).  You have shortness of breath. Document Released: 01/12/2013 Document Reviewed: 01/12/2013 [facility] Patient Information 2015 [place], [place]. This information is not intended to replace advice given to you by your health care provider. Make sure you discuss any questions you have with your health care provider. Endometrial Ablation Endometrial ablation removes the lining of the uterus (endometrium). It is usually a same-day, outpatient treatment. Ablation helps avoid major surgery, such as surgery to remove the cervix and uterus (hysterectomy). After endometrial ablation, you will have little or no menstrual bleeding and may not be able to have children. However, if you are premenopausal, you will need to use a reliable method of birth control following the procedure because of the small chance that pregnancy can occur. There are different reasons to have this procedure, which include:  Heavy periods.  Bleeding that is causing anemia.  Irregular bleeding.  Bleeding fibroids on the lining inside the uterus if they are smaller than 3 centimeters. This procedure should not be done if:  You want children in the future.  You have severe cramps with your menstrual period.  You have precancerous  or cancerous cells in your uterus.  You were recently pregnant.  You have gone through menopause.  You have had major surgery on the uterus, such as a cesarean delivery. LET [facility]OUR HEALTH CARE PROVIDER KNOW ABOUT:  Any allergies you have.  All medicines you are taking, including vitamins, herbs, eye drops, creams, and  over-the-counter medicines.  Previous problems you or members of your family have had with the use of anesthetics.  Any blood disorders you have.  Previous surgeries you have had.  Medical conditions you have. RISKS AND COMPLICATIONS  Generally, this is a safe procedure. However, as with any procedure, complications can occur. Possible complications include:  Perforation of the uterus.  Bleeding.  Infection of the uterus, bladder, or vagina.  Injury to surrounding organs.  An air bubble to the lung (air embolus).  Pregnancy following the procedure.  Failure of the procedure to help the problem, requiring hysterectomy.  Decreased ability to diagnose cancer in the lining of the uterus. BEFORE THE PROCEDURE  The lining of the uterus must be tested to make sure there is no pre-cancerous or cancer cells present.  An ultrasound may be performed to look at the size of the uterus and to check for abnormalities.  Medicines may be given to thin the lining of the uterus. PROCEDURE  During the procedure, your health care provider will use a tool called a resectoscope to help see inside your uterus. There are different ways to remove the lining of your uterus.   Radiofrequency - This method uses a radiofrequency-alternating electric current to remove the lining of the uterus.  Cryotherapy - This method uses extreme cold to freeze the lining of the uterus.  Heated-Free Liquid - This method uses heated salt (saline) solution to remove the lining of the uterus.  Microwave - This method uses high-energy microwaves to heat up the lining of the uterus to remove it.  Thermal balloon - This method involves inserting a catheter with a balloon tip into the uterus. The balloon tip is filled with heated fluid to remove the lining of the uterus. AFTER THE PROCEDURE  After your procedure, do not have sexual intercourse or insert anything into your vagina until permitted by your health care  provider. After the procedure, you may experience:  Cramps.  Vaginal discharge.  Frequent urination. Document Released: 02/01/2004 Document Revised: 11/24/2012 Document Reviewed: 08/25/2012 [facility] Patient Information 2015 [place], [place]. This information is not intended to replace advice given to you by your health care provider. Make sure you discuss any questions you have with your health care provider.  No Ibuprofen containing products (ie Advil, Aleve, Motrin, etc) until after 9:15 pm tonight.

## 2014-03-14 NOTE — Transfer of Care (Signed)
Immediate Anesthesia Transfer of Care Note  Patient: Angelica Ortiz  Procedure(s) Performed: Procedure(s): HYSTEROSCOPY WITH NOVASURE (N/A)  Patient Location: PACU  Anesthesia Type:Spinal  Level of Consciousness: awake, alert , oriented and patient cooperative  Airway & Oxygen Therapy: Patient Spontanous Breathing  Post-op Assessment: Report given to PACU RN and Post -op Vital signs reviewed and stable  Post vital signs: Reviewed and stable  Complications: No apparent anesthesia complications

## 2014-03-14 NOTE — Op Note (Signed)
03/14/2014  3:01 PM  PATIENT:  Angelica Ortiz  48 y.o. female  PRE-OPERATIVE DIAGNOSIS:  cpt [id number] - Menorrhagia  POST-OPERATIVE DIAGNOSIS:  cpt [id number] - Menorrhagia; Abnormal Uterine Bleeding  PROCEDURE:  Procedure(s): HYSTEROSCOPY WITH NOVASURE (N/A)  SURGEON:  Surgeon(s) and Role:    * Willodean Rosenthalarolyn Harraway-Smith, MD - Primary  ANESTHESIA:   spinal  EBL:  Total I/O In: 1000 [I.V.:1000] Out: -   BLOOD ADMINISTERED:none  DRAINS: none   LOCAL MEDICATIONS USED:  MARCAINE     SPECIMEN:  Source of Specimen:  endometrial currettings  DISPOSITION OF SPECIMEN:  PATHOLOGY  COUNTS:  YES  TOURNIQUET:  * No tourniquets in log *  DICTATION: .Note written in EPIC  PLAN OF CARE: Discharge to home after PACU  PATIENT DISPOSITION:  PACU - hemodynamically stable.   Delay start of Pharmacological VTE agent (>24hrs) due to surgical blood loss or risk of bleeding: not applicable  The risks, benefits, and alternatives of surgery were explained, understood, and accepted. The consents were signed and all questions were answered. She was taken to the operating room and general anesthesia was applied without complication. She was placed in the dorsal lithotomy position and her vagina and abdomen were prepped and draped in the usual sterile fashion. A bimanual exam revealed a normal size and shape anteverted mobile uterus. Her adnexa were non-enlarged.   A bivalved speculum was placed in the patients' vagina and the anterior lip of the cervix was grasped with a single toothed tenaculum. A paracervical block was performed at 5 and 7 o'clock with 10cc of 0.5% Marcaine.   The endometrial cavity was sounded to 11cm and the endocervical length measured 3cm. A hysteroscope was inserted and the endometrium was noted to be thickened diffusely.  The ostia on both sides were noted.  The scope was removed and a sharp currete was used to scape the lining of the uterus until a gritty texture was noted throughout.   Specimens were sent to pathology.  The NovaSure device was then inserted and seated using 6.5cm as the cavity length and 3.4cm as the cavity width.  The total activation time was 55 sec at a power of 122.  The hysteroscope was reinserted and an even burn pattern was noted to the fundus.  The single toothed tenaculum was removed at the end of the case and no bleeding was noted from the cervix.   The patient was extubated and taken to the recovery room in stable condition.  Sponge, lap and instrument counts were correct.  There were no complications.  Yogesh Cominsky L. Harraway-Smith, M.D., [facility]   Azaylah Stailey L. Harraway-Smith, M.D., [facility]

## 2014-03-14 NOTE — Anesthesia Procedure Notes (Signed)
Spinal Patient location during procedure: OR Preanesthetic Checklist Completed: patient identified, site marked, surgical consent, pre-op evaluation, timeout performed, IV checked, risks and benefits discussed and monitors and equipment checked Spinal Block Patient position: sitting Prep: DuraPrep Patient monitoring: cardiac monitor, continuous pulse ox, blood pressure and heart rate Approach: midline Location: L3-4 Injection technique: catheter Needle Needle type: Tuohy and Sprotte  Needle gauge: 24 G Needle length: 12.7 cm Needle insertion depth: 9 cm Catheter type: closed end flexible Catheter size: 19 g Additional Notes 1 level above scar sprotte thru touhy 1.2cc 5%Xylocaine

## 2014-03-16 ENCOUNTER — Encounter (HOSPITAL_COMMUNITY): Payer: Self-pay | Admitting: Obstetrics & Gynecology

## 2014-03-20 ENCOUNTER — Other Ambulatory Visit: Payer: Self-pay | Admitting: Primary Care

## 2014-03-20 DIAGNOSIS — Z1231 Encounter for screening mammogram for malignant neoplasm of breast: Secondary | ICD-10-CM

## 2014-04-12 ENCOUNTER — Ambulatory Visit
Admission: RE | Admit: 2014-04-12 | Discharge: 2014-04-12 | Disposition: A | Discharge status: Home / Self Care | Payer: Medicare Other | Source: Ambulatory Visit | Attending: Primary Care | Admitting: Primary Care

## 2014-04-12 ENCOUNTER — Other Ambulatory Visit: Payer: Self-pay | Admitting: Primary Care

## 2014-04-12 DIAGNOSIS — Z1231 Encounter for screening mammogram for malignant neoplasm of breast: Secondary | ICD-10-CM

## 2014-04-13 ENCOUNTER — Encounter: Payer: Self-pay | Admitting: Obstetrics & Gynecology

## 2014-04-13 ENCOUNTER — Ambulatory Visit: Payer: Medicare Other | Admitting: Obstetrics & Gynecology

## 2014-04-13 VITALS — BP 138/75 | HR 98 | Temp 99.1°F | Wt 358.3 lb

## 2014-04-13 DIAGNOSIS — Z9889 Other specified postprocedural states: Secondary | ICD-10-CM

## 2014-04-13 NOTE — Patient Instructions (Signed)
Endometrial Ablation Endometrial ablation removes the lining of the uterus (endometrium). It is usually a same-day, outpatient treatment. Ablation helps avoid major surgery, such as surgery to remove the cervix and uterus (hysterectomy). After endometrial ablation, you will have little or no menstrual bleeding and may not be able to have children. However, if you are premenopausal, you will need to use a reliable method of birth control following the procedure because of the small chance that pregnancy can occur. There are different reasons to have this procedure, which include:  Heavy periods.  Bleeding that is causing anemia.  Irregular bleeding.  Bleeding fibroids on the lining inside the uterus if they are smaller than 3 centimeters. This procedure should not be done if:  You want children in the future.  You have severe cramps with your menstrual period.  You have precancerous or cancerous cells in your uterus.  You were recently pregnant.  You have gone through menopause.  You have had major surgery on the uterus, such as a cesarean delivery. LET YOUR HEALTH CARE PROVIDER KNOW ABOUT:  Any allergies you have.  All medicines you are taking, including vitamins, herbs, eye drops, creams, and over-the-counter medicines.  Previous problems you or members of your family have had with the use of anesthetics.  Any blood disorders you have.  Previous surgeries you have had.  Medical conditions you have. RISKS AND COMPLICATIONS  Generally, this is a safe procedure. However, as with any procedure, complications can occur. Possible complications include:  Perforation of the uterus.  Bleeding.  Infection of the uterus, bladder, or vagina.  Injury to surrounding organs.  An air bubble to the lung (air embolus).  Pregnancy following the procedure.  Failure of the procedure to help the problem, requiring hysterectomy.  Decreased ability to diagnose cancer in the lining of  the uterus. BEFORE THE PROCEDURE  The lining of the uterus must be tested to make sure there is no pre-cancerous or cancer cells present.  An ultrasound may be performed to look at the size of the uterus and to check for abnormalities.  Medicines may be given to thin the lining of the uterus. PROCEDURE  During the procedure, your health care provider will use a tool called a resectoscope to help see inside your uterus. There are different ways to remove the lining of your uterus.   Radiofrequency - This method uses a radiofrequency-alternating electric current to remove the lining of the uterus.  Cryotherapy - This method uses extreme cold to freeze the lining of the uterus.  Heated-Free Liquid - This method uses heated salt (saline) solution to remove the lining of the uterus.  Microwave - This method uses high-energy microwaves to heat up the lining of the uterus to remove it.  Thermal balloon - This method involves inserting a catheter with a balloon tip into the uterus. The balloon tip is filled with heated fluid to remove the lining of the uterus. AFTER THE PROCEDURE  After your procedure, do not have sexual intercourse or insert anything into your vagina until permitted by your health care provider. After the procedure, you may experience:  Cramps.  Vaginal discharge.  Frequent urination. Document Released: 02/01/2004 Document Revised: 11/24/2012 Document Reviewed: 08/25/2012 [employer] Patient Information 2015 [employer]. This information is not intended to replace advice given to you by your health care provider. Make sure you discuss any questions you have with your health care provider.  

## 2014-04-13 NOTE — Progress Notes (Signed)
Subjective:     Patient ID: Angelica Ortiz, female   DOB: 09/06/1965, 49 y.o.   MRN: [id number]  HPI Pt is here for a post op f/u.  She reports that the bleeding was much decreased but, has not completely stopped.  Pt denies pain.   Review of Systems     Objective:   Physical Exam Blood pressure 138/75, pulse 98, temperature 99.1 F (37.3 C), weight 358 lb 4.8 oz (162.524 kg). Exam deferred  03/14/2014 Diagnosis Endometrium, curettage - BENIGN POLYPOID ENDOMETRIUM WITH FOCAL CYSTIC CHANGE. - BACKGROUND BENIGN SQUAMOUS AND ENDOCERVICAL MUCOSAL FRAGMENTS. - NO ATYPIA OR MALIGNANCY IDENTIFIED. - SEE COMMENT    Assessment:     4 week post op doing well      Plan:     F/u in 3 months or sooner prn Pt will take Megace for 2 weeks

## 2014-04-13 NOTE — Progress Notes (Signed)
Pt complains that she continues to bleed everyday and continues to cramp

## 2014-06-02 ENCOUNTER — Encounter (HOSPITAL_COMMUNITY): Payer: Self-pay | Admitting: Emergency Medicine

## 2014-06-02 ENCOUNTER — Emergency Department (HOSPITAL_COMMUNITY)
Admission: EM | Admit: 2014-06-02 | Discharge: 2014-06-02 | Disposition: A | Discharge status: Home / Self Care | Payer: Medicare Other | Attending: Emergency Medicine | Admitting: Emergency Medicine

## 2014-06-02 DIAGNOSIS — M797 Fibromyalgia: Secondary | ICD-10-CM | POA: Diagnosis not present

## 2014-06-02 DIAGNOSIS — Z87448 Personal history of other diseases of urinary system: Secondary | ICD-10-CM | POA: Insufficient documentation

## 2014-06-02 DIAGNOSIS — E119 Type 2 diabetes mellitus without complications: Secondary | ICD-10-CM | POA: Insufficient documentation

## 2014-06-02 DIAGNOSIS — Z79899 Other long term (current) drug therapy: Secondary | ICD-10-CM | POA: Insufficient documentation

## 2014-06-02 DIAGNOSIS — Z86711 Personal history of pulmonary embolism: Secondary | ICD-10-CM | POA: Insufficient documentation

## 2014-06-02 DIAGNOSIS — Z7951 Long term (current) use of inhaled steroids: Secondary | ICD-10-CM | POA: Insufficient documentation

## 2014-06-02 DIAGNOSIS — G43909 Migraine, unspecified, not intractable, without status migrainosus: Secondary | ICD-10-CM | POA: Diagnosis not present

## 2014-06-02 DIAGNOSIS — T464X5A Adverse effect of angiotensin-converting-enzyme inhibitors, initial encounter: Secondary | ICD-10-CM | POA: Diagnosis not present

## 2014-06-02 DIAGNOSIS — D649 Anemia, unspecified: Secondary | ICD-10-CM | POA: Diagnosis not present

## 2014-06-02 DIAGNOSIS — I10 Essential (primary) hypertension: Secondary | ICD-10-CM | POA: Insufficient documentation

## 2014-06-02 DIAGNOSIS — J45909 Unspecified asthma, uncomplicated: Secondary | ICD-10-CM | POA: Diagnosis not present

## 2014-06-02 DIAGNOSIS — K219 Gastro-esophageal reflux disease without esophagitis: Secondary | ICD-10-CM | POA: Insufficient documentation

## 2014-06-02 DIAGNOSIS — T783XXA Angioneurotic edema, initial encounter: Secondary | ICD-10-CM | POA: Insufficient documentation

## 2014-06-02 MED ORDER — PREDNISONE 50 MG PO TABS: 50.0000 mg | ORAL_TABLET | Freq: Every day | ORAL | Status: DC

## 2014-06-02 MED ORDER — FAMOTIDINE 20 MG PO TABS
40.0000 mg | ORAL_TABLET | Freq: Once | ORAL | Status: AC
  Administered 2014-06-02: 40 mg via ORAL
  Filled 2014-06-02: qty 2

## 2014-06-02 MED ORDER — FAMOTIDINE 20 MG PO TABS: 20.0000 mg | ORAL_TABLET | Freq: Two times a day (BID) | ORAL | Status: DC

## 2014-06-02 MED ORDER — PREDNISONE 20 MG PO TABS
60.0000 mg | ORAL_TABLET | Freq: Once | ORAL | Status: AC
  Administered 2014-06-02: 60 mg via ORAL
  Filled 2014-06-02: qty 3

## 2014-06-02 MED ORDER — DIPHENHYDRAMINE HCL 25 MG PO CAPS
50.0000 mg | ORAL_CAPSULE | Freq: Once | ORAL | Status: AC
  Administered 2014-06-02: 50 mg via ORAL
  Filled 2014-06-02: qty 2

## 2014-06-02 MED ORDER — AMLODIPINE BESYLATE 10 MG PO TABS: 10.0000 mg | ORAL_TABLET | Freq: Every day | ORAL | Status: DC

## 2014-06-02 MED ORDER — DIPHENHYDRAMINE HCL 25 MG PO TABS: 25.0000 mg | ORAL_TABLET | Freq: Four times a day (QID) | ORAL | Status: DC

## 2014-06-02 NOTE — ED Notes (Signed)
Pt has significant swelling to upper lip. Pt has taken lisinopril x 5 years without previous reaction. Pt sts she was having trouble breathing but took a benadryl and feels better at this time.

## 2014-06-02 NOTE — ED Notes (Signed)
Pt takes lisinopril x 5 years.  States last night she began having lip swelling.  Airway clear.

## 2014-06-02 NOTE — ED Provider Notes (Signed)
1634  Pt is breathing easily.  No trouble swallowing.  No stridor.  She does have some persistent upper lip swelling.  Will dc lisinopril.  Send home with antihistamines and steroids.  Norvasc for HTN until she can see her primary care doctor to regarding alternative blood pressure medications.  Linwood DibblesJon Rollie Hynek, MD 06/02/14 [phone]

## 2014-06-02 NOTE — Discharge Instructions (Signed)
Angioedema °Angioedema is a sudden swelling of tissues, often of the skin. It can occur on the face or genitals or in the abdomen or other body parts. The swelling usually develops over a short period and gets better in 24 to 48 hours. It often begins during the night and is found when the person wakes up. The person may also get red, itchy patches of skin (hives). Angioedema can be dangerous if it involves swelling of the air passages.  °Depending on the cause, episodes of angioedema may only happen once, come back in unpredictable patterns, or repeat for several years and then gradually fade away.  °CAUSES  °Angioedema can be caused by an allergic reaction to various triggers. It can also result from nonallergic causes, including reactions to drugs, immune system disorders, viral infections, or an abnormal gene that is passed to you from your parents (hereditary). For some people with angioedema, the cause is unknown.  °Some things that can trigger angioedema include:  °· Foods.   °· Medicines, such as ACE inhibitors, ARBs, nonsteroidal anti-inflammatory agents, or estrogen.   °· Latex.   °· Animal saliva.   °· Insect stings.   °· Dyes used in X-rays.   °· Mild injury.   °· Dental work. °· Surgery. °· Stress.   °· Sudden changes in temperature.   °· Exercise. °SIGNS AND SYMPTOMS  °· Swelling of the skin. °· Hives. If these are present, there is also intense itching. °· Redness in the affected area.   °· Pain in the affected area. °· Swollen lips or tongue. °· Breathing problems. This may happen if the air passages swell. °· Wheezing. °If internal organs are involved, there may be:  °· Nausea.   °· Abdominal pain.   °· Vomiting.   °· Difficulty swallowing.   °· Difficulty passing urine. °DIAGNOSIS  °· Your health care provider will examine the affected area and take a medical and family history. °· Various tests may be done to help determine the cause. Tests may include: °¨ Allergy skin tests to see if the problem  is an allergic reaction.   °¨ Blood tests to check for hereditary angioedema.   °¨ Tests to check for underlying diseases that could cause the condition.   °· A review of your medicines, including over-the-counter medicines, may be done. °TREATMENT  °Treatment will depend on the cause of the angioedema. Possible treatments include:  °· Removal of anything that triggered the condition (such as stopping certain medicines).   °· Medicines to treat symptoms or prevent attacks. Medicines given may include:   °¨ Antihistamines.   °¨ Epinephrine injection.   °¨ Steroids.   °· Hospitalization may be required for severe attacks. If the air passages are affected, it can be an emergency. Tubes may need to be placed to keep the airway open. °HOME CARE INSTRUCTIONS  °· Take all medicines as directed by your health care provider. °· If you were given medicines for emergency allergy treatment, always carry them with you. °· Wear a medical bracelet as directed by your health care provider.   °· Avoid known triggers. °SEEK MEDICAL CARE IF:  °· You have repeat attacks of angioedema.   °· Your attacks are more frequent or more severe despite preventive measures.   °· You have hereditary angioedema and are considering having children. It is important to discuss with your health care provider the risks of passing the condition on to your children. °SEEK IMMEDIATE MEDICAL CARE IF:  °· You have severe swelling of the mouth, tongue, or lips. °· You have difficulty breathing.   °· You have difficulty swallowing.   °· You faint. °MAKE   SURE YOU: °· Understand these instructions. °· Will watch your condition. °· Will get help right away if you are not doing well or get worse. °Document Released: 06/02/2001 Document Revised: 08/08/2013 Document Reviewed: 11/15/2012 °Exit[employer]® Patient Information ©2015 [employer]. This information is not intended to replace advice given to you by your health care provider. Make sure you discuss any questions  you have with your health care provider. ° °

## 2014-06-02 NOTE — ED Provider Notes (Signed)
CSN: [id number]     Arrival date & time 06/02/14  1216 History   First MD Initiated Contact with Patient 06/02/14 1259     Chief Complaint  Patient presents with  . Angioedema      HPI Pt was seen at 1320.  Per pt, c/o gradual onset and persistence of constant upper lip "swelling" since last night. Pt states she took a benadryl last night with improvement. Pt states she continues to have upper lip swelling today. Pt endorses 7162yr hx of taking lisinopril, and denies any dose changes. Denies any recent new meds. Denies intra-oral edema, no rash, no SOB, no wheezing, no stridor, no dysphagia.     Past Medical History  Diagnosis Date  . Diverticulitis   . Asthma   . Hypertension   . Fibromyalgia   . Lupus     no meds  . GERD (gastroesophageal reflux disease)   . Overactive bladder   . Migraine   . Morbid obesity 03/30/2013  . Pulmonary embolism     Resolved -At age 49 broken right leg, blood clot broke off - into lung  . Diabetes mellitus without complication     Type 2  . Anemia   . Overactive bladder   . Seasonal allergies   . Sleep apnea 3/15    Does not have CPAP machine   Past Surgical History  Procedure Laterality Date  . Cholecystectomy    . Appendectomy    . Cesarean section  2001    x 1  . Rotator cuff repair      right  . Laminectomy      L4-L5  . Orif ankle fracture Left   . Dilation and curettage of uterus      x 3 for AUB  . Laparoscopic ovarian cystectomy Right 1994  . Tonsillectomy    . Hysteroscopy with novasure N/A 03/14/2014    Procedure: HYSTEROSCOPY WITH NOVASURE;  Surgeon: Willodean Rosenthalarolyn Harraway-Smith, MD;  Location: [facility];  Service: Gynecology;  Laterality: N/A;    History  Substance Use Topics  . Smoking status: Never Smoker   . Smokeless tobacco: Never Used  . Alcohol Use: No   OB History    Gravida Para Term Preterm AB TAB SAB Ectopic Multiple Living   3 1 0 1 2 0 2 0 0 1      Review of Systems ROS: Statement: All systems negative except  as marked or noted in the HPI; Constitutional: Negative for fever and chills. ; ; Eyes: Negative for eye pain, redness and discharge. ; ; ENMT: Negative for ear pain, hoarseness, nasal congestion, sinus pressure and sore throat. ; ; Cardiovascular: Negative for chest pain, palpitations, diaphoresis, dyspnea and peripheral edema. ; ; Respiratory: Negative for cough, wheezing and stridor. ; ; Gastrointestinal: Negative for nausea, vomiting, diarrhea, abdominal pain, blood in stool, hematemesis, jaundice and rectal bleeding. . ; ; Genitourinary: Negative for dysuria, flank pain and hematuria. ; ; Musculoskeletal: Negative for back pain and neck pain. Negative for trauma.; ; Skin: +upper lip edema. Negative for pruritus, rash, abrasions, blisters, bruising and skin lesion.; ; Neuro: Negative for headache, lightheadedness and neck stiffness. Negative for weakness, altered level of consciousness , altered mental status, extremity weakness, paresthesias, involuntary movement, seizure and syncope.      Allergies  Dilaudid; Levaquin; and Tangerine flavor  Home Medications   Prior to Admission medications   Medication Sig Start Date End Date Taking? Authorizing Provider  albuterol (PROVENTIL HFA;VENTOLIN HFA) 108 (90 BASE) MCG/ACT  inhaler Inhale 2 puffs into the lungs 2 (two) times daily.   Yes Historical Provider, MD  budesonide-formoterol (SYMBICORT) 160-4.5 MCG/ACT inhaler Inhale 2 puffs into the lungs 2 (two) times daily.    Yes Historical Provider, MD  ferrous sulfate 325 (65 FE) MG tablet Take 325 mg by mouth 3 (three) times daily with meals.   Yes Historical Provider, MD  fluticasone (FLONASE) 50 MCG/ACT nasal spray Place 2 sprays into both nostrils daily as needed for allergies or rhinitis.   Yes Historical Provider, MD  glipiZIDE (GLUCOTROL) 10 MG tablet Take 10 mg by mouth 2 (two) times daily before a meal.    Yes Historical Provider, MD  lisinopril-hydrochlorothiazide (PRINZIDE,ZESTORETIC) 20-12.5  MG per tablet Take 1 tablet by mouth at bedtime.    Yes Historical Provider, MD  metFORMIN (GLUCOPHAGE) 1000 MG tablet Take 1,000 mg by mouth 2 (two) times daily with a meal.   Yes Historical Provider, MD  montelukast (SINGULAIR) 10 MG tablet Take 10 mg by mouth at bedtime.   Yes Historical Provider, MD  omeprazole (PRILOSEC) 40 MG capsule Take 40 mg by mouth every evening.   Yes Historical Provider, MD  oxybutynin (DITROPAN-XL) 10 MG 24 hr tablet Take 10 mg by mouth at bedtime.    Yes Historical Provider, MD  oxyCODONE-acetaminophen (PERCOCET) 7.5-325 MG per tablet Take 1 tablet by mouth every 8 (eight) hours as needed for pain.   Yes Historical Provider, MD  pregabalin (LYRICA) 75 MG capsule Take 75 mg by mouth 4 (four) times daily.    Yes Historical Provider, MD  rizatriptan (MAXALT) 10 MG tablet Take 10 mg by mouth 2 (two) times daily as needed for migraine (migraine). May repeat in 2 hours if needed   Yes Historical Provider, MD  sitaGLIPtin (JANUVIA) 100 MG tablet Take 100 mg by mouth daily.   Yes Historical Provider, MD  terbinafine (LAMISIL) 250 MG tablet Take 250 mg by mouth daily.   Yes Historical Provider, MD  cyclobenzaprine (FLEXERIL) 10 MG tablet Take 10 mg by mouth daily as needed for muscle spasms.    Historical Provider, MD  ibuprofen (ADVIL,MOTRIN) 600 MG tablet Take 1 tablet (600 mg total) by mouth every 6 (six) hours as needed. Patient not taking: Reported on 06/02/2014 03/14/14   Willodean Rosenthal, MD   BP 134/64 mmHg  Pulse 92  Temp(Src) 98.6 F (37 C) (Oral)  Resp 20  SpO2 97% Physical Exam  1325; Physical examination:  Nursing notes reviewed; Vital signs and O2 SAT reviewed;  Constitutional: Well developed, Well nourished, Well hydrated, In no acute distress; Head:  Normocephalic, atraumatic; Eyes: EOMI, PERRL, No scleral icterus; ENMT: +upper outer lip edema. No rash. Mouth and pharynx without lesions. No tonsillar exudates. No intra-oral edema. No submandibular or  sublingual edema. No hoarse voice, no drooling, no stridor. No trismus. Mouth and pharynx normal, Mucous membranes moist; Neck: Supple, Full range of motion, No lymphadenopathy; Cardiovascular: Regular rate and rhythm, No gallop; Respiratory: Breath sounds clear & equal bilaterally, No wheezes.  Speaking full sentences with ease, Normal respiratory effort/excursion; Chest: Nontender, Movement normal; Abdomen: Soft, Nontender, Nondistended, Normal bowel sounds; Genitourinary: No CVA tenderness; Extremities: Pulses normal, No tenderness, No edema, No calf edema or asymmetry.; Neuro: AA&Ox3, Major CN grossly intact.  Speech clear. No gross focal motor or sensory deficits in extremities.; Skin: Color normal, Warm, Dry.   ED Course  Procedures     EKG Interpretation None      MDM  MDM Reviewed: previous chart,  nursing note and vitals     1600:  Meds given. Will need 4 hr ED observation for rebound/worsening. Will need to d/c lisinopril. Sign out to Dr. Lynelle Doctor.     Samuel Jester, DO 06/02/14 1606

## 2014-06-19 ENCOUNTER — Telehealth: Payer: Self-pay | Admitting: General Practice

## 2014-06-19 NOTE — Telephone Encounter (Signed)
Patient called and left a message stating she had an ablation in December and didn't completely stop bleeding and was put on megace by Dr Erin FullingHarraway Smith. States she is in excruciating pain and bleeding like crazy and messing up her clothes and would like a call back as soon as possible. Called patient stating I am returning her call and asked if she was still taking the megace if so how much and when the bleeding started. Patient states she is taking megace 40mg  daily and the bleeding started Thursday. Spoke to Dr Erin FullingHarraway Smith who advised patient take megace 40mg  BID. Instructed patient on change in medication and to call us back in a couple days if that hasn't helped her bleeding. Patient verbalized understanding and states she would also like an appt to come in and see Dr Erin FullingHarraway Smith because she is tired of all this bleeding and needs another option. Told patient I will let our front office know and they will contact her with an appt. Patient verbalized understanding and had no other questions

## 2014-06-28 ENCOUNTER — Inpatient Hospital Stay (HOSPITAL_COMMUNITY)
Admission: EM | Admit: 2014-06-28 | Discharge: 2014-07-02 | DRG: 392 | Disposition: A | Discharge status: Home / Self Care | Payer: Medicare Other | Attending: Obstetrics and Gynecology | Admitting: Obstetrics and Gynecology

## 2014-06-28 ENCOUNTER — Emergency Department (HOSPITAL_COMMUNITY): Payer: Medicare Other

## 2014-06-28 ENCOUNTER — Encounter (HOSPITAL_COMMUNITY): Payer: Self-pay

## 2014-06-28 DIAGNOSIS — Z6841 Body Mass Index (BMI) 40.0 and over, adult: Secondary | ICD-10-CM

## 2014-06-28 DIAGNOSIS — G4730 Sleep apnea, unspecified: Secondary | ICD-10-CM | POA: Diagnosis present

## 2014-06-28 DIAGNOSIS — G43909 Migraine, unspecified, not intractable, without status migrainosus: Secondary | ICD-10-CM | POA: Diagnosis present

## 2014-06-28 DIAGNOSIS — R102 Pelvic and perineal pain: Principal | ICD-10-CM | POA: Diagnosis present

## 2014-06-28 DIAGNOSIS — R509 Fever, unspecified: Secondary | ICD-10-CM

## 2014-06-28 DIAGNOSIS — R109 Unspecified abdominal pain: Secondary | ICD-10-CM

## 2014-06-28 DIAGNOSIS — M797 Fibromyalgia: Secondary | ICD-10-CM | POA: Diagnosis present

## 2014-06-28 DIAGNOSIS — E119 Type 2 diabetes mellitus without complications: Secondary | ICD-10-CM | POA: Diagnosis present

## 2014-06-28 DIAGNOSIS — D649 Anemia, unspecified: Secondary | ICD-10-CM | POA: Diagnosis present

## 2014-06-28 DIAGNOSIS — I10 Essential (primary) hypertension: Secondary | ICD-10-CM | POA: Diagnosis present

## 2014-06-28 DIAGNOSIS — N7093 Salpingitis and oophoritis, unspecified: Secondary | ICD-10-CM | POA: Diagnosis present

## 2014-06-28 DIAGNOSIS — Z888 Allergy status to other drugs, medicaments and biological substances status: Secondary | ICD-10-CM

## 2014-06-28 DIAGNOSIS — K219 Gastro-esophageal reflux disease without esophagitis: Secondary | ICD-10-CM | POA: Diagnosis present

## 2014-06-28 DIAGNOSIS — Z9049 Acquired absence of other specified parts of digestive tract: Secondary | ICD-10-CM | POA: Diagnosis present

## 2014-06-28 DIAGNOSIS — J45909 Unspecified asthma, uncomplicated: Secondary | ICD-10-CM | POA: Diagnosis present

## 2014-06-28 LAB — COMPREHENSIVE METABOLIC PANEL
ALT: 15 U/L (ref 0–35)
AST: 14 U/L (ref 0–37)
Albumin: 3.5 g/dL (ref 3.5–5.2)
Alkaline Phosphatase: 63 U/L (ref 39–117)
Anion gap: 11 (ref 5–15)
BUN: 21 mg/dL (ref 6–23)
CO2: 29 mmol/L (ref 19–32)
Calcium: 8.8 mg/dL (ref 8.4–10.5)
Chloride: 96 mmol/L (ref 96–112)
Creatinine, Ser: 1.03 mg/dL (ref 0.50–1.10)
GFR calc Af Amer: 74 mL/min — ABNORMAL LOW (ref >90)
GFR calc non Af Amer: 64 mL/min — ABNORMAL LOW (ref >90)
Glucose, Bld: 137 mg/dL — ABNORMAL HIGH (ref 70–99)
Potassium: 3.2 mmol/L — ABNORMAL LOW (ref 3.5–5.1)
SODIUM: 136 mmol/L (ref 135–145)
TOTAL PROTEIN: 7.6 g/dL (ref 6.0–8.3)
Total Bilirubin: 0.7 mg/dL (ref 0.3–1.2)

## 2014-06-28 LAB — URINALYSIS, ROUTINE W REFLEX MICROSCOPIC
Bilirubin Urine: NEGATIVE
Glucose, UA: NEGATIVE mg/dL
Ketones, ur: NEGATIVE mg/dL
Nitrite: POSITIVE — AB
PROTEIN: 30 mg/dL — AB
Specific Gravity, Urine: 1.03 (ref 1.005–1.030)
UROBILINOGEN UA: 0.2 mg/dL (ref 0.0–1.0)
pH: 5.5 (ref 5.0–8.0)

## 2014-06-28 LAB — URINE MICROSCOPIC-ADD ON

## 2014-06-28 LAB — CBC WITH DIFFERENTIAL/PLATELET
BASOS ABS: 0 10*3/uL (ref 0.0–0.1)
BASOS PCT: 0 % (ref 0–1)
Eosinophils Absolute: 0 10*3/uL (ref 0.0–0.7)
Eosinophils Relative: 0 % (ref 0–5)
HCT: 28.9 % — ABNORMAL LOW (ref 36.0–46.0)
Hemoglobin: 8.7 g/dL — ABNORMAL LOW (ref 12.0–15.0)
LYMPHS ABS: 2.3 10*3/uL (ref 0.7–4.0)
Lymphocytes Relative: 14 % (ref 12–46)
MCH: 20.6 pg — AB (ref 26.0–34.0)
MCHC: 30.1 g/dL (ref 30.0–36.0)
MCV: 68.5 fL — AB (ref 78.0–100.0)
MONO ABS: 1.8 10*3/uL — AB (ref 0.1–1.0)
Monocytes Relative: 11 % (ref 3–12)
Neutro Abs: 12 10*3/uL — ABNORMAL HIGH (ref 1.7–7.7)
Neutrophils Relative %: 74 % (ref 43–77)
Platelets: 319 10*3/uL (ref 150–400)
RBC: 4.22 MIL/uL (ref 3.87–5.11)
RDW: 20.3 % — AB (ref 11.5–15.5)
WBC: 16.2 10*3/uL — ABNORMAL HIGH (ref 4.0–10.5)

## 2014-06-28 MED ORDER — DEXTROSE 5 % IV SOLN
1.0000 g | INTRAVENOUS | Status: DC
  Administered 2014-06-28: 1 g via INTRAVENOUS
  Filled 2014-06-28: qty 10

## 2014-06-28 MED ORDER — SODIUM CHLORIDE 0.9 % IV SOLN
INTRAVENOUS | Status: DC
  Administered 2014-06-28 – 2014-07-01 (×5): via INTRAVENOUS

## 2014-06-28 MED ORDER — MORPHINE SULFATE 4 MG/ML IJ SOLN
6.0000 mg | Freq: Once | INTRAMUSCULAR | Status: AC
  Administered 2014-06-28: 6 mg via INTRAVENOUS
  Filled 2014-06-28: qty 2

## 2014-06-28 MED ORDER — POTASSIUM CHLORIDE CRYS ER 20 MEQ PO TBCR
40.0000 meq | EXTENDED_RELEASE_TABLET | Freq: Once | ORAL | Status: AC
  Administered 2014-06-28: 40 meq via ORAL
  Filled 2014-06-28: qty 2

## 2014-06-28 MED ORDER — IOHEXOL 300 MG/ML  SOLN
100.0000 mL | Freq: Once | INTRAMUSCULAR | Status: AC | PRN
  Administered 2014-06-28: 125 mL via INTRAVENOUS

## 2014-06-28 NOTE — ED Notes (Addendum)
Patient reports "everything hurts."  She reports she has had a fever for two days at home.  Denies cough.  Denies N/V/D.  She is a pain management clinic patient and reports that her medications haven't helped decrease her pain.  Last dose of tylenol 1500.

## 2014-06-28 NOTE — ED Notes (Signed)
Patient transported to CT 

## 2014-06-28 NOTE — ED Notes (Signed)
Pt reports generalized body aches and fever x2 days - pt has been taking tylenol at home for fever w/ minimal relief. Denies n/v/d or cough.

## 2014-06-28 NOTE — ED Notes (Signed)
Selena BattenKim, RN at bedside obtaining IV and labs

## 2014-06-29 ENCOUNTER — Encounter (HOSPITAL_COMMUNITY): Payer: Self-pay | Admitting: *Deleted

## 2014-06-29 DIAGNOSIS — K219 Gastro-esophageal reflux disease without esophagitis: Secondary | ICD-10-CM | POA: Diagnosis present

## 2014-06-29 DIAGNOSIS — M797 Fibromyalgia: Secondary | ICD-10-CM | POA: Diagnosis present

## 2014-06-29 DIAGNOSIS — Z6841 Body Mass Index (BMI) 40.0 and over, adult: Secondary | ICD-10-CM | POA: Diagnosis not present

## 2014-06-29 DIAGNOSIS — D649 Anemia, unspecified: Secondary | ICD-10-CM | POA: Diagnosis present

## 2014-06-29 DIAGNOSIS — G4730 Sleep apnea, unspecified: Secondary | ICD-10-CM | POA: Diagnosis present

## 2014-06-29 DIAGNOSIS — N7093 Salpingitis and oophoritis, unspecified: Secondary | ICD-10-CM | POA: Diagnosis present

## 2014-06-29 DIAGNOSIS — Z9049 Acquired absence of other specified parts of digestive tract: Secondary | ICD-10-CM | POA: Diagnosis present

## 2014-06-29 DIAGNOSIS — R509 Fever, unspecified: Secondary | ICD-10-CM | POA: Diagnosis present

## 2014-06-29 DIAGNOSIS — G43909 Migraine, unspecified, not intractable, without status migrainosus: Secondary | ICD-10-CM | POA: Diagnosis present

## 2014-06-29 DIAGNOSIS — R102 Pelvic and perineal pain: Secondary | ICD-10-CM | POA: Diagnosis present

## 2014-06-29 DIAGNOSIS — E119 Type 2 diabetes mellitus without complications: Secondary | ICD-10-CM | POA: Diagnosis present

## 2014-06-29 DIAGNOSIS — Z888 Allergy status to other drugs, medicaments and biological substances status: Secondary | ICD-10-CM | POA: Diagnosis not present

## 2014-06-29 DIAGNOSIS — I10 Essential (primary) hypertension: Secondary | ICD-10-CM | POA: Diagnosis present

## 2014-06-29 DIAGNOSIS — J45909 Unspecified asthma, uncomplicated: Secondary | ICD-10-CM | POA: Diagnosis present

## 2014-06-29 LAB — GLUCOSE, CAPILLARY
GLUCOSE-CAPILLARY: 149 mg/dL — AB (ref 70–99)
GLUCOSE-CAPILLARY: 238 mg/dL — AB (ref 70–99)
Glucose-Capillary: 230 mg/dL — ABNORMAL HIGH (ref 70–99)
Glucose-Capillary: 173 mg/dL — ABNORMAL HIGH (ref 70–99)

## 2014-06-29 LAB — BASIC METABOLIC PANEL
Anion gap: 9 (ref 5–15)
BUN: 17 mg/dL (ref 6–23)
CALCIUM: 8.3 mg/dL — AB (ref 8.4–10.5)
CO2: 30 mmol/L (ref 19–32)
CREATININE: 0.91 mg/dL (ref 0.50–1.10)
Chloride: 95 mmol/L — ABNORMAL LOW (ref 96–112)
GFR calc Af Amer: 86 mL/min — ABNORMAL LOW (ref >90)
GFR, EST NON AFRICAN AMERICAN: 74 mL/min — AB (ref >90)
Glucose, Bld: 232 mg/dL — ABNORMAL HIGH (ref 70–99)
Potassium: 3.2 mmol/L — ABNORMAL LOW (ref 3.5–5.1)
SODIUM: 134 mmol/L — AB (ref 135–145)

## 2014-06-29 LAB — GC/CHLAMYDIA PROBE AMP (~~LOC~~) NOT AT ARMC
CHLAMYDIA, DNA PROBE: NEGATIVE
NEISSERIA GONORRHEA: NEGATIVE

## 2014-06-29 LAB — CBC
HEMATOCRIT: 27.1 % — AB (ref 36.0–46.0)
HEMOGLOBIN: 8.3 g/dL — AB (ref 12.0–15.0)
MCH: 21.1 pg — ABNORMAL LOW (ref 26.0–34.0)
MCHC: 30.6 g/dL (ref 30.0–36.0)
MCV: 68.8 fL — AB (ref 78.0–100.0)
Platelets: 292 10*3/uL (ref 150–400)
RBC: 3.94 MIL/uL (ref 3.87–5.11)
RDW: 20.3 % — AB (ref 11.5–15.5)
WBC: 16.1 10*3/uL — AB (ref 4.0–10.5)

## 2014-06-29 LAB — TSH: TSH: 4.811 u[IU]/mL — ABNORMAL HIGH (ref 0.350–4.500)

## 2014-06-29 LAB — HIV ANTIBODY (ROUTINE TESTING W REFLEX): HIV SCREEN 4TH GENERATION: NONREACTIVE

## 2014-06-29 LAB — WET PREP, GENITAL
Clue Cells Wet Prep HPF POC: NONE SEEN
Trich, Wet Prep: NONE SEEN
Yeast Wet Prep HPF POC: NONE SEEN

## 2014-06-29 LAB — SEDIMENTATION RATE: Sed Rate: 70 mm/hr — ABNORMAL HIGH (ref 0–22)

## 2014-06-29 MED ORDER — MONTELUKAST SODIUM 10 MG PO TABS
10.0000 mg | ORAL_TABLET | Freq: Every day | ORAL | Status: DC
  Administered 2014-06-29 – 2014-07-01 (×3): 10 mg via ORAL
  Filled 2014-06-29 (×4): qty 1

## 2014-06-29 MED ORDER — LINAGLIPTIN 5 MG PO TABS
5.0000 mg | ORAL_TABLET | Freq: Every day | ORAL | Status: DC
  Administered 2014-06-29 – 2014-07-02 (×4): 5 mg via ORAL
  Filled 2014-06-29 (×5): qty 1

## 2014-06-29 MED ORDER — AMLODIPINE BESYLATE 10 MG PO TABS
10.0000 mg | ORAL_TABLET | Freq: Every day | ORAL | Status: DC
  Administered 2014-06-29 – 2014-07-02 (×4): 10 mg via ORAL
  Filled 2014-06-29 (×5): qty 1

## 2014-06-29 MED ORDER — DOXYCYCLINE HYCLATE 100 MG IV SOLR
100.0000 mg | Freq: Once | INTRAVENOUS | Status: DC
  Administered 2014-06-29: 100 mg via INTRAVENOUS
  Filled 2014-06-29: qty 100

## 2014-06-29 MED ORDER — PREGABALIN 75 MG PO CAPS
75.0000 mg | ORAL_CAPSULE | Freq: Three times a day (TID) | ORAL | Status: DC
  Administered 2014-06-29 – 2014-07-02 (×8): 75 mg via ORAL
  Filled 2014-06-29 (×8): qty 1

## 2014-06-29 MED ORDER — PRENATAL MULTIVITAMIN CH
1.0000 | ORAL_TABLET | Freq: Every day | ORAL | Status: DC
  Administered 2014-06-29 – 2014-07-01 (×3): 1 via ORAL
  Filled 2014-06-29 (×3): qty 1

## 2014-06-29 MED ORDER — TERBINAFINE HCL 250 MG PO TABS
250.0000 mg | ORAL_TABLET | Freq: Every day | ORAL | Status: DC
  Administered 2014-06-29 – 2014-07-02 (×4): 250 mg via ORAL
  Filled 2014-06-29 (×5): qty 1

## 2014-06-29 MED ORDER — HYDROCHLOROTHIAZIDE 25 MG PO TABS
25.0000 mg | ORAL_TABLET | Freq: Every day | ORAL | Status: DC
  Administered 2014-06-29 – 2014-07-02 (×4): 25 mg via ORAL
  Filled 2014-06-29 (×3): qty 1

## 2014-06-29 MED ORDER — ONDANSETRON HCL 4 MG PO TABS: 4.0000 mg | ORAL_TABLET | Freq: Four times a day (QID) | ORAL | Status: DC | PRN

## 2014-06-29 MED ORDER — SUMATRIPTAN SUCCINATE 50 MG PO TABS
50.0000 mg | ORAL_TABLET | ORAL | Status: DC | PRN
Start: 2014-06-29 — End: 2014-07-02
  Filled 2014-06-29: qty 1

## 2014-06-29 MED ORDER — FAMOTIDINE 20 MG PO TABS
20.0000 mg | ORAL_TABLET | Freq: Every day | ORAL | Status: DC
  Administered 2014-06-29 – 2014-07-02 (×4): 20 mg via ORAL
  Filled 2014-06-29 (×4): qty 1

## 2014-06-29 MED ORDER — DEXTROSE 5 % IV SOLN
2.0000 g | Freq: Two times a day (BID) | INTRAVENOUS | Status: DC
  Administered 2014-06-29 – 2014-07-01 (×6): 2 g via INTRAVENOUS
  Filled 2014-06-29 (×7): qty 2

## 2014-06-29 MED ORDER — PANTOPRAZOLE SODIUM 40 MG PO TBEC
80.0000 mg | DELAYED_RELEASE_TABLET | Freq: Every day | ORAL | Status: DC
  Administered 2014-06-29 – 2014-07-02 (×4): 80 mg via ORAL
  Filled 2014-06-29 (×4): qty 2

## 2014-06-29 MED ORDER — HYDROMORPHONE HCL 1 MG/ML IJ SOLN: 1.0000 mg | INTRAMUSCULAR | Status: DC | PRN

## 2014-06-29 MED ORDER — MORPHINE SULFATE 4 MG/ML IJ SOLN
4.0000 mg | INTRAMUSCULAR | Status: DC | PRN
  Administered 2014-06-29 – 2014-07-01 (×10): 4 mg via INTRAVENOUS
  Filled 2014-06-29 (×10): qty 1

## 2014-06-29 MED ORDER — MORPHINE SULFATE 4 MG/ML IJ SOLN
6.0000 mg | Freq: Once | INTRAMUSCULAR | Status: AC
  Administered 2014-06-29: 6 mg via INTRAVENOUS
  Filled 2014-06-29: qty 2

## 2014-06-29 MED ORDER — DEXTROSE 5 % IV SOLN
2.0000 g | Freq: Once | INTRAVENOUS | Status: AC
  Administered 2014-06-29: 2 g via INTRAVENOUS
  Filled 2014-06-29: qty 2

## 2014-06-29 MED ORDER — BUDESONIDE-FORMOTEROL FUMARATE 160-4.5 MCG/ACT IN AERO
2.0000 | INHALATION_SPRAY | Freq: Two times a day (BID) | RESPIRATORY_TRACT | Status: DC
  Administered 2014-06-29 – 2014-07-02 (×7): 2 via RESPIRATORY_TRACT
  Filled 2014-06-29: qty 6

## 2014-06-29 MED ORDER — FLUTICASONE PROPIONATE 50 MCG/ACT NA SUSP
2.0000 | Freq: Every day | NASAL | Status: DC
  Administered 2014-06-29 – 2014-07-02 (×4): 2 via NASAL
  Filled 2014-06-29: qty 16

## 2014-06-29 MED ORDER — LOSARTAN POTASSIUM 50 MG PO TABS
100.0000 mg | ORAL_TABLET | Freq: Every day | ORAL | Status: DC
  Administered 2014-06-29 – 2014-07-02 (×4): 100 mg via ORAL
  Filled 2014-06-29 (×5): qty 2

## 2014-06-29 MED ORDER — ENOXAPARIN SODIUM 40 MG/0.4ML ~~LOC~~ SOLN
40.0000 mg | SUBCUTANEOUS | Status: DC
  Administered 2014-06-29 – 2014-07-02 (×4): 40 mg via SUBCUTANEOUS
  Filled 2014-06-29 (×5): qty 0.4

## 2014-06-29 MED ORDER — OXYCODONE-ACETAMINOPHEN 5-325 MG PO TABS
1.5000 | ORAL_TABLET | Freq: Three times a day (TID) | ORAL | Status: DC
Start: 2014-06-29 — End: 2014-07-01
  Administered 2014-06-29 – 2014-07-01 (×5): 1.5 via ORAL
  Filled 2014-06-29 (×5): qty 2

## 2014-06-29 MED ORDER — METFORMIN HCL 500 MG PO TABS
1000.0000 mg | ORAL_TABLET | Freq: Two times a day (BID) | ORAL | Status: DC
  Administered 2014-07-01 – 2014-07-02 (×3): 1000 mg via ORAL
  Filled 2014-06-29 (×9): qty 2

## 2014-06-29 MED ORDER — LOSARTAN POTASSIUM-HCTZ 100-25 MG PO TABS: 1.0000 | ORAL_TABLET | Freq: Every day | ORAL | Status: DC

## 2014-06-29 MED ORDER — ALBUTEROL SULFATE (2.5 MG/3ML) 0.083% IN NEBU
3.0000 mL | INHALATION_SOLUTION | Freq: Two times a day (BID) | RESPIRATORY_TRACT | Status: DC
  Administered 2014-06-29 – 2014-07-01 (×5): 3 mL via RESPIRATORY_TRACT
  Filled 2014-06-29 (×6): qty 3

## 2014-06-29 MED ORDER — MEGESTROL ACETATE 40 MG PO TABS
40.0000 mg | ORAL_TABLET | Freq: Two times a day (BID) | ORAL | Status: DC
  Administered 2014-06-29 – 2014-07-02 (×7): 40 mg via ORAL
  Filled 2014-06-29 (×9): qty 1

## 2014-06-29 MED ORDER — ONDANSETRON HCL 4 MG/2ML IJ SOLN: 4.0000 mg | Freq: Four times a day (QID) | INTRAMUSCULAR | Status: DC | PRN

## 2014-06-29 MED ORDER — GLIPIZIDE 10 MG PO TABS
10.0000 mg | ORAL_TABLET | Freq: Two times a day (BID) | ORAL | Status: DC
  Administered 2014-06-29 – 2014-07-02 (×6): 10 mg via ORAL
  Filled 2014-06-29 (×9): qty 1

## 2014-06-29 MED ORDER — DOXYCYCLINE HYCLATE 100 MG IV SOLR
100.0000 mg | Freq: Two times a day (BID) | INTRAVENOUS | Status: DC
  Administered 2014-06-29 – 2014-07-01 (×5): 100 mg via INTRAVENOUS
  Filled 2014-06-29 (×6): qty 100

## 2014-06-29 NOTE — ED Provider Notes (Signed)
CSN: [id number]     Arrival date & time 06/28/14  1946 History   First MD Initiated Contact with Patient 06/28/14 2015     Chief Complaint  Patient presents with  . Generalized Body Aches     (Consider location/radiation/quality/duration/timing/severity/associated sxs/prior Treatment) HPI Comments: Patient here complaining of generalized abdominal pain and fever and body aches 2 days. Denies any nausea vomiting diarrhea. No vaginal bleeding or discharge. Pain has been sharp and worse with movement. No treatment use prior to arrival. Denies any urinary symptoms.  The history is provided by the patient.    Past Medical History  Diagnosis Date  . Diverticulitis   . Asthma   . Hypertension   . Fibromyalgia   . Lupus     no meds  . GERD (gastroesophageal reflux disease)   . Overactive bladder   . Migraine   . Morbid obesity 03/30/2013  . Pulmonary embolism     Resolved -At age 42 broken right leg, blood clot broke off - into lung  . Diabetes mellitus without complication     Type 2  . Anemia   . Overactive bladder   . Seasonal allergies   . Sleep apnea 3/15    Does not have CPAP machine   Past Surgical History  Procedure Laterality Date  . Cholecystectomy    . Appendectomy    . Cesarean section  2001    x 1  . Rotator cuff repair      right  . Laminectomy      L4-L5  . Orif ankle fracture Left   . Dilation and curettage of uterus      x 3 for AUB  . Laparoscopic ovarian cystectomy Right 1994  . Tonsillectomy    . Hysteroscopy with novasure N/A 03/14/2014    Procedure: HYSTEROSCOPY WITH NOVASURE;  Surgeon: Willodean Rosenthal, MD;  Location: [facility];  Service: Gynecology;  Laterality: N/A;   History reviewed. No pertinent family history. History  Substance Use Topics  . Smoking status: Never Smoker   . Smokeless tobacco: Never Used  . Alcohol Use: No   OB History    Gravida Para Term Preterm AB TAB SAB Ectopic Multiple Living        Review of Systems  All other systems reviewed and are negative.     Allergies  Dilaudid; Levaquin; Tangerine flavor; and Ace inhibitors  Home Medications   Prior to Admission medications   Medication Sig Start Date End Date Taking? Authorizing Provider  acetaminophen (TYLENOL) 500 MG tablet Take 1,000 mg by mouth every 6 (six) hours as needed for fever (fever).   Yes Historical Provider, MD  albuterol (PROVENTIL HFA;VENTOLIN HFA) 108 (90 BASE) MCG/ACT inhaler Inhale 2 puffs into the lungs 2 (two) times daily.   Yes Historical Provider, MD  AmLODIPine Besylate (NORVASC PO) Take 20 mg by mouth at bedtime.   Yes Historical Provider, MD  budesonide-formoterol (SYMBICORT) 160-4.5 MCG/ACT inhaler Inhale 2 puffs into the lungs 2 (two) times daily.    Yes Historical Provider, MD  diphenhydrAMINE (BENADRYL) 25 MG tablet Take 1 tablet (25 mg total) by mouth every 6 (six) hours. 06/02/14  Yes Linwood Dibbles, MD  famotidine (PEPCID) 20 MG tablet Take 1 tablet (20 mg total) by mouth 2 (two) times daily. Patient taking differently: Take 20 mg by mouth daily.  06/02/14  Yes Linwood Dibbles, MD  ferrous sulfate 325 (65 FE) MG tablet Take 325 mg  by mouth 3 (three) times daily with meals.   Yes Historical Provider, MD  fluticasone (FLONASE) 50 MCG/ACT nasal spray Place 2 sprays into both nostrils daily.    Yes Historical Provider, MD  glipiZIDE (GLUCOTROL) 10 MG tablet Take 10 mg by mouth 2 (two) times daily before a meal.    Yes Historical Provider, MD  losartan-hydrochlorothiazide (HYZAAR) 100-25 MG per tablet Take 1 tablet by mouth daily.   Yes Historical Provider, MD  megestrol (MEGACE) 40 MG tablet Take 40 mg by mouth 2 (two) times daily.   Yes Historical Provider, MD  metFORMIN (GLUCOPHAGE) 1000 MG tablet Take 1,000 mg by mouth 2 (two) times daily with a meal.   Yes Historical Provider, MD  montelukast (SINGULAIR) 10 MG tablet Take 10 mg by mouth at bedtime.   Yes Historical Provider, MD  omeprazole (PRILOSEC)  40 MG capsule Take 40 mg by mouth every evening.   Yes Historical Provider, MD  oxybutynin (DITROPAN-XL) 10 MG 24 hr tablet Take 10 mg by mouth at bedtime.    Yes Historical Provider, MD  oxyCODONE-acetaminophen (PERCOCET) 7.5-325 MG per tablet Take 1 tablet by mouth 3 (three) times daily.    Yes Historical Provider, MD  predniSONE (DELTASONE) 50 MG tablet Take 1 tablet (50 mg total) by mouth daily. 06/02/14  Yes Linwood Dibbles, MD  pregabalin (LYRICA) 75 MG capsule Take 75 mg by mouth 3 (three) times daily.    Yes Historical Provider, MD  rizatriptan (MAXALT) 10 MG tablet Take 10 mg by mouth 2 (two) times daily as needed for migraine (migraine). May repeat in 2 hours if needed   Yes Historical Provider, MD  sitaGLIPtin (JANUVIA) 100 MG tablet Take 100 mg by mouth daily.   Yes Historical Provider, MD  terbinafine (LAMISIL) 250 MG tablet Take 250 mg by mouth daily.   Yes Historical Provider, MD  amLODipine (NORVASC) 10 MG tablet Take 1 tablet (10 mg total) by mouth daily. Patient not taking: Reported on 06/28/2014 06/02/14   Linwood Dibbles, MD  ibuprofen (ADVIL,MOTRIN) 600 MG tablet Take 1 tablet (600 mg total) by mouth every 6 (six) hours as needed. Patient not taking: Reported on 06/02/2014 03/14/14   Willodean Rosenthal, MD   BP 145/76 mmHg  Pulse 122  Temp(Src) 100.8 F (38.2 C) (Oral)  Resp 19  SpO2 96% Physical Exam  Constitutional: She is oriented to person, place, and time. She appears well-developed and well-nourished.  Non-toxic appearance. No distress.  HENT:  Head: Normocephalic and atraumatic.  Eyes: Conjunctivae, EOM and lids are normal. Pupils are equal, round, and reactive to light.  Neck: Normal range of motion. Neck supple. No tracheal deviation present. No thyroid mass present.  Cardiovascular: Regular rhythm and normal heart sounds.  Tachycardia present.  Exam reveals no gallop.   No murmur heard. Pulmonary/Chest: Effort normal and breath sounds normal. No stridor. No respiratory  distress. She has no decreased breath sounds. She has no wheezes. She has no rhonchi. She has no rales.  Abdominal: Soft. Normal appearance and bowel sounds are normal. She exhibits no distension. There is generalized tenderness. There is no rigidity, no rebound, no guarding and no CVA tenderness.  Musculoskeletal: Normal range of motion. She exhibits no edema or tenderness.  Neurological: She is alert and oriented to person, place, and time. She has normal strength. No cranial nerve deficit or sensory deficit. GCS eye subscore is 4. GCS verbal subscore is 5. GCS motor subscore is 6.  Skin: Skin is warm and dry.  No abrasion and no rash noted.  Psychiatric: She has a normal mood and affect. Her speech is normal and behavior is normal.  Nursing note and vitals reviewed.   ED Course  Procedures (including critical care time) Labs Review Labs Reviewed  CBC WITH DIFFERENTIAL/PLATELET - Abnormal; Notable for the following:    WBC 16.2 (*)    Hemoglobin 8.7 (*)    HCT 28.9 (*)    MCV 68.5 (*)    MCH 20.6 (*)    RDW 20.3 (*)    Neutro Abs 12.0 (*)    Monocytes Absolute 1.8 (*)    All other components within normal limits  COMPREHENSIVE METABOLIC PANEL - Abnormal; Notable for the following:    Potassium 3.2 (*)    Glucose, Bld 137 (*)    GFR calc non Af Amer 64 (*)    GFR calc Af Amer 74 (*)    All other components within normal limits  URINALYSIS, ROUTINE W REFLEX MICROSCOPIC - Abnormal; Notable for the following:    Color, Urine AMBER (*)    APPearance CLOUDY (*)    Hgb urine dipstick TRACE (*)    Protein, ur 30 (*)    Nitrite POSITIVE (*)    Leukocytes, UA SMALL (*)    All other components within normal limits  URINE MICROSCOPIC-ADD ON - Abnormal; Notable for the following:    Bacteria, UA MANY (*)    Casts GRANULAR CAST (*)    All other components within normal limits  CULTURE, BLOOD (ROUTINE X 2)  CULTURE, BLOOD (ROUTINE X 2)  URINE CULTURE    Imaging Review Dg Chest 2  View  06/28/2014   CLINICAL DATA:  Fever, body ache  EXAM: CHEST  2 VIEW  COMPARISON:  None.  FINDINGS: Cardiomediastinal silhouette is unremarkable. No acute infiltrate or pleural effusion. No pulmonary edema. Degenerative changes mid and lower thoracic spine  IMPRESSION: No active cardiopulmonary disease.   Electronically Signed   By: Natasha Mead M.D.   On: 06/28/2014 20:40   Ct Abdomen Pelvis W Contrast  06/28/2014   CLINICAL DATA:  Generalized abdominal pain. Lower pelvic pain progressing to global abdominal pain for 2 days. Fever.  EXAM: CT ABDOMEN AND PELVIS WITH CONTRAST  TECHNIQUE: Multidetector CT imaging of the abdomen and pelvis was performed using the standard protocol following bolus administration of intravenous contrast.  CONTRAST:  OMNIPAQUE IOHEXOL 300 MG/ML  SOLN  COMPARISON:  Most recent CT 03/29/2013. Pelvic ultrasound 03/30/2014  FINDINGS: The included lung bases are clear.  Irregular left adnexal fluid collection measures approximately 8.7 x 5.0 x 4.9 cm, surrounding soft tissue stranding and inflammation. This is in the region of the left ovary an abuts the left aspect of the uterus. This is similar in location to that of prior CT, however there is currently a larger soft tissue component less of a fluid component to this collection. Soft tissue inflammation tracks in the left retroperitoneum. This appears to be related to the ovary or adnexa, there are scattered colonic diverticula of the adjacent sigmoid colon, however a fat plane is seen between colon and this collection. Left periaortic adenopathy, largest lymph node is 13 mm in short axis dimension, similar in appearance to prior exam. Additional small retroperitoneal lymph nodes are also seen.  The liver is mildly enlarged with hepatic steatosis. No evidence of focal lesion. Clips in the gallbladder fossa from cholecystectomy. No biliary dilatation. The spleen and adrenal glands are unremarkable. Mild fatty infiltration of the  pancreas, no pancreatic inflammatory change. There is symmetric renal enhancement and excretion. No hydronephrosis.  Stomach is decompressed. Small hiatal hernia. There are no dilated or thickened small bowel loops. Small volume of stool throughout the colon. Diverticulosis noted of the distal descending and sigmoid colon. The appendix is not identified.  Urinary bladder is minimally distended. No free fluid in the cul-de-sac. Uterus appears prominent in size. Prominent lymph nodes in the left external iliac and inguinal stations.  Degenerative changes again seen in the lumbar spine. 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  IMPRESSION: 1. Recurrent left adnexal collection, current measurements 8.7 x 5.0 x 4.9 cm. This was not seen on interval pelvic ultrasound, and a similar in location to that is of more remote CT. This is likely a tubo-ovarian abscess, abscess related to diverticulitis or ovarian mass are felt less likely. Surrounding soft tissue inflammatory changes seen. There is minimal fluid component of this collection with primarily phlegmonous soft tissue change. 2. Periaortic adenopathy is similar to prior exam and likely reactive.   Electronically Signed   By: Rubye OaksMelanie  Ehinger M.D.   On: 06/28/2014 23:54     EKG Interpretation None      MDM   Final diagnoses:  Abdominal pain    Patient given IV pain medication 2 here. Abdominal CT results consistent with tubo-ovarian abscess. Discussed with Dr. Emelda FearFerguson GYN on call and he will admit the patient was Hospital. Patient had 2 g of cefoxitin and 100 mg of doxycycline. I was unable to perform the patient's pelvic examination was relayed to Dr. Emelda FearFerguson.   Lorre NickAnthony Tandre Conly, MD 06/29/14 [phone]

## 2014-06-29 NOTE — H&P (Signed)
Angelica Ortiz is an 49 y.o. female. She is admitted for recurrent left tubo-ovarian abscess after presenting to her long hospital with 2 days of malaise., Denying nausea vomiting and diarrhea. She did have a fever to 100.8,, white count with left shift, white count 16,000, and CT scan shows an 8 x 4 x 5 cm area of tissue stranding and inflammation, possibly representing an abscess. This is located in the similar location to her tubo-ovarian abscess in December 2014 for which she was admitted to [facility] faculty practice and underwent IR drainage of a 10 cm tubo-ovarian abscess and then subsequent outpatient therapy. The patient has medical history significant for diabetes, hypertension and history of diverticulitis. The current mass/abscess is not felt likely to be diverticular disease, nor a ovarian process. The patient is admitted for IV antibiotic therapy, begun on cefotetan and doxycycline intravenous, is scheduled for a pelvic ultrasound tomorrow when the abdomen will hopefully be less tender. The patient is aware that recurrent IR drainage may become necessary. Significant interval history since 2014 is that in December 2015 she was evaluated and 4 heavy bleeding, and underwent endometrial ablation. The preoperative evaluation included an ultrasound which showed an essentially normal sized left ovary at the time of November 2015 ultrasound.  Pertinent Gynecological History: Menses: flow is light since endometrial ablation Bleeding: None at present Contraception: Status post ablation DES exposure: unknown Blood transfusions: none Sexually transmitted diseases: None recently. The patient is married in a stable relationship 1 decade Previous GYN Procedures: DNC and An endometrial ablation 2015. IR drainage of tubo-ovarian abscess December 2014  Last mammogram: normal Date: January 2016 Last pap: normal Date: March 2015 OB History: G 3, P 0121   Menstrual History: Menarche age:  No  LMP recorded. Patient has had an ablation.    Past Medical History  Diagnosis Date  . Diverticulitis   . Asthma   . Hypertension   . Fibromyalgia   . Lupus     no meds  . GERD (gastroesophageal reflux disease)   . Overactive bladder   . Migraine   . Morbid obesity 03/30/2013  . Pulmonary embolism     Resolved -At age 15 broken right leg, blood clot broke off - into lung  . Diabetes mellitus without complication     Type 2  . Anemia   . Overactive bladder   . Seasonal allergies   . Sleep apnea 3/15    Does not have CPAP machine    Past Surgical History  Procedure Laterality Date  . Cholecystectomy    . Appendectomy    . Cesarean section  2001    x 1  . Rotator cuff repair      right  . Laminectomy      L4-L5  . Orif ankle fracture Left   . Dilation and curettage of uterus      x 3 for AUB  . Laparoscopic ovarian cystectomy Right 1994  . Tonsillectomy    . Hysteroscopy with novasure N/A 03/14/2014    Procedure: HYSTEROSCOPY WITH NOVASURE;  Surgeon: Willodean Rosenthal, MD;  Location: [facility];  Service: Gynecology;  Laterality: N/A;    Family History  Problem Relation Age of Onset  . Diabetes Mother   . Hypertension Mother   . Heart disease Mother   . Cancer Father   . Heart disease Father   . Hypertension Sister   . Cancer Sister   . Diabetes Brother   . Hypertension Brother   . Cancer  Maternal Uncle   . Cancer Paternal Uncle     Social History:  reports that she has never smoked. She has never used smokeless tobacco. She reports that she does not drink alcohol or use illicit drugs.  Allergies:  Allergies  Allergen Reactions  . Dilaudid [Hydromorphone Hcl] Hives and Itching  . Levaquin [Levofloxacin In D5w] Hives, Shortness Of Breath and Itching  . Tangerine Flavor Swelling    Lip and tongue swelling with tangerines  . Ace Inhibitors Swelling    Prescriptions prior to admission  Medication Sig Dispense Refill Last Dose  . acetaminophen  (TYLENOL) 500 MG tablet Take 1,000 mg by mouth every 6 (six) hours as needed for fever (fever).   06/28/2014 at Unknown time  . albuterol (PROVENTIL HFA;VENTOLIN HFA) 108 (90 BASE) MCG/ACT inhaler Inhale 2 puffs into the lungs 2 (two) times daily.   06/27/2014 at Unknown time  . AmLODIPine Besylate (NORVASC PO) Take 20 mg by mouth at bedtime.   06/27/2014 at Unknown time  . budesonide-formoterol (SYMBICORT) 160-4.5 MCG/ACT inhaler Inhale 2 puffs into the lungs 2 (two) times daily.    06/27/2014 at Unknown time  . diphenhydrAMINE (BENADRYL) 25 MG tablet Take 1 tablet (25 mg total) by mouth every 6 (six) hours. 20 tablet 0 Past Month at Unknown time  . famotidine (PEPCID) 20 MG tablet Take 1 tablet (20 mg total) by mouth 2 (two) times daily. (Patient taking differently: Take 20 mg by mouth daily. ) 10 tablet 0 06/27/2014 at Unknown time  . ferrous sulfate 325 (65 FE) MG tablet Take 325 mg by mouth 3 (three) times daily with meals.   06/27/2014 at Unknown time  . fluticasone (FLONASE) 50 MCG/ACT nasal spray Place 2 sprays into both nostrils daily.    06/27/2014 at Unknown time  . glipiZIDE (GLUCOTROL) 10 MG tablet Take 10 mg by mouth 2 (two) times daily before a meal.    06/27/2014 at Unknown time  . losartan-hydrochlorothiazide (HYZAAR) 100-25 MG per tablet Take 1 tablet by mouth daily.   06/27/2014 at Unknown time  . megestrol (MEGACE) 40 MG tablet Take 40 mg by mouth 2 (two) times daily.   06/27/2014 at Unknown time  . metFORMIN (GLUCOPHAGE) 1000 MG tablet Take 1,000 mg by mouth 2 (two) times daily with a meal.   06/27/2014 at Unknown time  . montelukast (SINGULAIR) 10 MG tablet Take 10 mg by mouth at bedtime.   06/27/2014 at Unknown time  . omeprazole (PRILOSEC) 40 MG capsule Take 40 mg by mouth every evening.   06/27/2014 at Unknown time  . oxybutynin (DITROPAN-XL) 10 MG 24 hr tablet Take 10 mg by mouth at bedtime.    06/27/2014 at Unknown time  . oxyCODONE-acetaminophen (PERCOCET) 7.5-325 MG per tablet Take 1  tablet by mouth 3 (three) times daily.    06/27/2014 at Unknown time  . predniSONE (DELTASONE) 50 MG tablet Take 1 tablet (50 mg total) by mouth daily. 5 tablet 0 06/27/2014 at Unknown time  . pregabalin (LYRICA) 75 MG capsule Take 75 mg by mouth 3 (three) times daily.    06/27/2014 at 2330  . rizatriptan (MAXALT) 10 MG tablet Take 10 mg by mouth 2 (two) times daily as needed for migraine (migraine). May repeat in 2 hours if needed   Past Week at Unknown time  . sitaGLIPtin (JANUVIA) 100 MG tablet Take 100 mg by mouth daily.   06/27/2014 at Unknown time  . terbinafine (LAMISIL) 250 MG tablet Take 250 mg by mouth  daily.   06/27/2014 at Unknown time  . amLODipine (NORVASC) 10 MG tablet Take 1 tablet (10 mg total) by mouth daily. (Patient not taking: Reported on 06/28/2014) 14 tablet 0 Not Taking at Unknown time  . ibuprofen (ADVIL,MOTRIN) 600 MG tablet Take 1 tablet (600 mg total) by mouth every 6 (six) hours as needed. (Patient not taking: Reported on 06/02/2014) 30 tablet 1 Completed Course at Unknown time    ROS  Blood pressure 126/74, pulse 111, temperature 99 F (37.2 C), temperature source Oral, resp. rate 22, height 5\' 6"  (1.676 m), weight 168.287 kg (371 lb 0.1 oz), SpO2 95 %. Physical Exam  Constitutional: She is oriented to person, place, and time. She appears well-developed and well-nourished.  Morbidly obese BMI greater than 60  HENT:  Head: Normocephalic and atraumatic.  Eyes: EOM are normal. Pupils are equal, round, and reactive to light.  Neck: Normal range of motion.  Cardiovascular: Normal rate.   Respiratory: Effort normal.  GI: She exhibits no mass. There is tenderness. There is rebound and guarding.  Genitourinary: Vagina normal.  GC and chlamydia culture obtained by a vaginal swab. Vaginal discharge is nonpurulent  Neurological: She is alert and oriented to person, place, and time.  Skin: Skin is warm and dry.  Psychiatric: She has a normal mood and affect.   examination  limited by body habitus and obesity  Results for orders placed or performed during the hospital encounter of 06/28/14 (from the past 24 hour(s))  Urinalysis with microscopic     Status: Abnormal   Collection Time: 06/28/14  9:36 PM  Result Value Ref Range   Color, Urine AMBER (A) YELLOW   APPearance CLOUDY (A) CLEAR   Specific Gravity, Urine 1.030 1.005 - 1.030   pH 5.5 5.0 - 8.0   Glucose, UA NEGATIVE NEGATIVE mg/dL   Hgb urine dipstick TRACE (A) NEGATIVE   Bilirubin Urine NEGATIVE NEGATIVE   Ketones, ur NEGATIVE NEGATIVE mg/dL   Protein, ur 30 (A) NEGATIVE mg/dL   Urobilinogen, UA 0.2 0.0 - 1.0 mg/dL   Nitrite POSITIVE (A) NEGATIVE   Leukocytes, UA SMALL (A) NEGATIVE  Urine microscopic-add on     Status: Abnormal   Collection Time: 06/28/14  9:36 PM  Result Value Ref Range   Squamous Epithelial / LPF RARE RARE   WBC, UA 7-10 <3 WBC/hpf   RBC / HPF 3-6 <3 RBC/hpf   Bacteria, UA MANY (A) RARE   Casts GRANULAR CAST (A) NEGATIVE  CBC WITH DIFFERENTIAL     Status: Abnormal   Collection Time: 06/28/14  9:45 PM  Result Value Ref Range   WBC 16.2 (H) 4.0 - 10.5 K/uL   RBC 4.22 3.87 - 5.11 MIL/uL   Hemoglobin 8.7 (L) 12.0 - 15.0 g/dL   HCT 40.928.9 (L) 81.136.0 - 91.446.0 %   MCV 68.5 (L) 78.0 - 100.0 fL   MCH 20.6 (L) 26.0 - 34.0 pg   MCHC 30.1 30.0 - 36.0 g/dL   RDW 78.220.3 (H) 95.611.5 - 21.315.5 %   Platelets 319 150 - 400 K/uL   Neutrophils Relative % 74 43 - 77 %   Neutro Abs 12.0 (H) 1.7 - 7.7 K/uL   Lymphocytes Relative 14 12 - 46 %   Lymphs Abs 2.3 0.7 - 4.0 K/uL   Monocytes Relative 11 3 - 12 %   Monocytes Absolute 1.8 (H) 0.1 - 1.0 K/uL   Eosinophils Relative 0 0 - 5 %   Eosinophils Absolute 0.0 0.0 - 0.7  K/uL   Basophils Relative 0 0 - 1 %   Basophils Absolute 0.0 0.0 - 0.1 K/uL  Comprehensive metabolic panel     Status: Abnormal   Collection Time: 06/28/14  9:45 PM  Result Value Ref Range   Sodium 136 135 - 145 mmol/L   Potassium 3.2 (L) 3.5 - 5.1 mmol/L   Chloride 96 96 - 112  mmol/L   CO2 29 19 - 32 mmol/L   Glucose, Bld 137 (H) 70 - 99 mg/dL   BUN 21 6 - 23 mg/dL   Creatinine, Ser 1.61 0.50 - 1.10 mg/dL   Calcium 8.8 8.4 - 09.6 mg/dL   Total Protein 7.6 6.0 - 8.3 g/dL   Albumin 3.5 3.5 - 5.2 g/dL   AST 14 0 - 37 U/L   ALT 15 0 - 35 U/L   Alkaline Phosphatase 63 39 - 117 U/L   Total Bilirubin 0.7 0.3 - 1.2 mg/dL   GFR calc non Af Amer 64 (L) >90 mL/min   GFR calc Af Amer 74 (L) >90 mL/min   Anion gap 11 5 - 15  Glucose, capillary     Status: Abnormal   Collection Time: 06/29/14  3:31 AM  Result Value Ref Range   Glucose-Capillary 230 (H) 70 - 99 mg/dL  Wet prep, genital     Status: Abnormal   Collection Time: 06/29/14  4:00 AM  Result Value Ref Range   Yeast Wet Prep HPF POC NONE SEEN NONE SEEN   Trich, Wet Prep NONE SEEN NONE SEEN   Clue Cells Wet Prep HPF POC NONE SEEN NONE SEEN   WBC, Wet Prep HPF POC FEW (A) NONE SEEN  Basic metabolic panel     Status: Abnormal   Collection Time: 06/29/14  5:17 AM  Result Value Ref Range   Sodium 134 (L) 135 - 145 mmol/L   Potassium 3.2 (L) 3.5 - 5.1 mmol/L   Chloride 95 (L) 96 - 112 mmol/L   CO2 30 19 - 32 mmol/L   Glucose, Bld 232 (H) 70 - 99 mg/dL   BUN 17 6 - 23 mg/dL   Creatinine, Ser 0.45 0.50 - 1.10 mg/dL   Calcium 8.3 (L) 8.4 - 10.5 mg/dL   GFR calc non Af Amer 74 (L) >90 mL/min   GFR calc Af Amer 86 (L) >90 mL/min   Anion gap 9 5 - 15  Sedimentation rate     Status: Abnormal   Collection Time: 06/29/14  5:17 AM  Result Value Ref Range   Sed Rate 70 (H) 0 - 22 mm/hr  TSH     Status: Abnormal   Collection Time: 06/29/14  5:17 AM  Result Value Ref Range   TSH 4.811 (H) 0.350 - 4.500 uIU/mL  CBC     Status: Abnormal   Collection Time: 06/29/14  5:17 AM  Result Value Ref Range   WBC 16.1 (H) 4.0 - 10.5 K/uL   RBC 3.94 3.87 - 5.11 MIL/uL   Hemoglobin 8.3 (L) 12.0 - 15.0 g/dL   HCT 40.9 (L) 81.1 - 91.4 %   MCV 68.8 (L) 78.0 - 100.0 fL   MCH 21.1 (L) 26.0 - 34.0 pg   MCHC 30.6 30.0 - 36.0 g/dL    RDW 78.2 (H) 95.6 - 15.5 %   Platelets 292 150 - 400 K/uL    Dg Chest 2 View  06/28/2014   CLINICAL DATA:  Fever, body ache  EXAM: CHEST  2 VIEW  COMPARISON:  None.  FINDINGS: Cardiomediastinal silhouette is unremarkable. No acute infiltrate or pleural effusion. No pulmonary edema. Degenerative changes mid and lower thoracic spine  IMPRESSION: No active cardiopulmonary disease.   Electronically Signed   By: Natasha Mead M.D.   On: 06/28/2014 20:40   Ct Abdomen Pelvis W Contrast  06/28/2014   CLINICAL DATA:  Generalized abdominal pain. Lower pelvic pain progressing to global abdominal pain for 2 days. Fever.  EXAM: CT ABDOMEN AND PELVIS WITH CONTRAST  TECHNIQUE: Multidetector CT imaging of the abdomen and pelvis was performed using the standard protocol following bolus administration of intravenous contrast.  CONTRAST:  OMNIPAQUE IOHEXOL 300 MG/ML  SOLN  COMPARISON:  Most recent CT 03/29/2013. Pelvic ultrasound 03/30/2014  FINDINGS: The included lung bases are clear.  Irregular left adnexal fluid collection measures approximately 8.7 x 5.0 x 4.9 cm, surrounding soft tissue stranding and inflammation. This is in the region of the left ovary an abuts the left aspect of the uterus. This is similar in location to that of prior CT, however there is currently a larger soft tissue component less of a fluid component to this collection. Soft tissue inflammation tracks in the left retroperitoneum. This appears to be related to the ovary or adnexa, there are scattered colonic diverticula of the adjacent sigmoid colon, however a fat plane is seen between colon and this collection. Left periaortic adenopathy, largest lymph node is 13 mm in short axis dimension, similar in appearance to prior exam. Additional small retroperitoneal lymph nodes are also seen.  The liver is mildly enlarged with hepatic steatosis. No evidence of focal lesion. Clips in the gallbladder fossa from cholecystectomy. No biliary dilatation.  The spleen and adrenal glands are unremarkable. Mild fatty infiltration of the pancreas, no pancreatic inflammatory change. There is symmetric renal enhancement and excretion. No hydronephrosis.  Stomach is decompressed. Small hiatal hernia. There are no dilated or thickened small bowel loops. Small volume of stool throughout the colon. Diverticulosis noted of the distal descending and sigmoid colon. The appendix is not identified.  Urinary bladder is minimally distended. No free fluid in the cul-de-sac. Uterus appears prominent in size. Prominent lymph nodes in the left external iliac and inguinal stations.  Degenerative changes again seen in the lumbar spine. 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  IMPRESSION: 1. Recurrent left adnexal collection, current measurements 8.7 x 5.0 x 4.9 cm. This was not seen on interval pelvic ultrasound, and a similar in location to that is of more remote CT. This is likely a tubo-ovarian abscess, abscess related to diverticulitis or ovarian mass are felt less likely. Surrounding soft tissue inflammatory changes seen. There is minimal fluid component of this collection with primarily phlegmonous soft tissue change. 2. Periaortic adenopathy is similar to prior exam and likely reactive.   Electronically Signed   By: Rubye Oaks M.D.   On: 06/28/2014 23:54    Assessment/Plan: Recurrent left tubo-ovarian abscess Plan admit for IV antibiotic therapy, ultrasound in a.m. Patient may be considered for recurrent IR drainage if she does not respond to antibiotic therapy  Balraj Brayfield,Oceguera V 06/29/2014, 10:34 AM

## 2014-06-29 NOTE — Progress Notes (Addendum)
Inpatient Diabetes Program Recommendations  AACE/ADA: New Consensus Statement on Inpatient Glycemic Control (2013)  Target Ranges:  Prepandial:   less than 140 mg/dL      Peak postprandial:   less than 180 mg/dL (1-2 hours)      Critically ill patients:  140 - 180 mg/dL  Results for Angelica Ortiz, Isel M (MRN [id number]) as of 06/29/2014 08:30  Ref. Range 06/29/2014 05:17  Glucose Latest Range: 70-99 mg/dL 621232 (H)   Results for Angelica Ortiz, [name] (MRN [id number]) as of 06/29/2014 08:30  Ref. Range 06/29/2014 03:31  Glucose-Capillary Latest Range: 70-99 mg/dL 962230 (H)   Diabetes history: DM2 Outpatient Diabetes medications: Januvia 100 mg daily, Metformin 1000 mg BID, Glipizide 10 mg BID Current orders for Inpatient glycemic control: Metformin 1000 mg BID, Glipizide 10 mg BID, Tradjenat 5 mg daily  Inpatient Diabetes Program Recommendations Correction (SSI): While inpatient, please consider ordering Glycemic Control order set with CBGs and Novolog moderate correction scale ACHS or Q4H (if continues to be NPO). HgbA1C: Please consider ordering an A1C to evaluate glycemic control over the past 2-3 months.  Thanks, Orlando PennerMarie Madeleyn Schwimmer, RN, MSN, CCRN, CDE Diabetes Coordinator Inpatient Diabetes Program [phone] (Team Pager from 8am to 5pm) [phone] ([facility] office) [phone] [facility] office)

## 2014-06-29 NOTE — Plan of Care (Signed)
Problem: Phase I Progression Outcomes Goal: Pain controlled with appropriate interventions Outcome: Progressing Patient stated that she just dozes to help with pain

## 2014-06-30 ENCOUNTER — Ambulatory Visit (HOSPITAL_COMMUNITY): Payer: Medicare Other

## 2014-06-30 DIAGNOSIS — N7093 Salpingitis and oophoritis, unspecified: Secondary | ICD-10-CM

## 2014-06-30 LAB — GLUCOSE, CAPILLARY
GLUCOSE-CAPILLARY: 195 mg/dL — AB (ref 70–99)
GLUCOSE-CAPILLARY: 205 mg/dL — AB (ref 70–99)
GLUCOSE-CAPILLARY: 175 mg/dL — AB (ref 70–99)
Glucose-Capillary: 172 mg/dL — ABNORMAL HIGH (ref 70–99)

## 2014-06-30 LAB — URINE CULTURE

## 2014-06-30 LAB — HEMOGLOBIN A1C
HEMOGLOBIN A1C: 7.8 % — AB (ref 4.8–5.6)
MEAN PLASMA GLUCOSE: 177 mg/dL

## 2014-06-30 NOTE — Progress Notes (Signed)
Inpatient Diabetes Program Recommendations  AACE/ADA: New Consensus Statement on Inpatient Glycemic Control (2013)  Target Ranges:  Prepandial:   less than 140 mg/dL      Peak postprandial:   less than 180 mg/dL (1-2 hours)      Critically ill patients:  140 - 180 mg/dL  Results for Angelica Ortiz, Eldoris M (MRN [id number]) as of 06/30/2014 09:40  Ref. Range 06/29/2014 03:31 06/29/2014 11:49 06/29/2014 20:26 06/29/2014 22:23 06/30/2014 09:36  Glucose-Capillary Latest Range: 70-99 mg/dL 341230 (H) 962173 (H) 229149 (H) 238 (H) 195 (H)   Diabetes history: DM2 Outpatient Diabetes medications: Januvia 100 mg daily, Metformin 1000 mg BID, Glipizide 10 mg BID Current orders for Inpatient glycemic control: Metformin 1000 mg BID, Glipizide 10 mg BID, Tradjenat 5 mg daily  Inpatient Diabetes Program Recommendations Correction (SSI): CBGs have ranged from 149-238 mg/dl over the past 24 hours.  While inpatient, please consider ordering Glycemic Control order set with CBGs and Novolog moderate correction scale ACHS. HgbA1C: Please consider ordering an A1C to evaluate glycemic control over the past 2-3 months.  Thanks, Orlando PennerMarie Deidrick Rainey, RN, MSN, CCRN, CDE Diabetes Coordinator Inpatient Diabetes Program [phone] (Team Pager from 8am to 5pm) [phone] ([facility] office) [phone] [facility] office)

## 2014-06-30 NOTE — Progress Notes (Signed)
Subjective: Patient reports some improvement in her abdominal pain. She states that prior to her admission she experienced generalized abdominal pain and now the pain is localized to her lower pelvis. She denies nausea or emesis and is eating some of her regular diet. She only ambulates in the room to go to the bathroom    Objective: I have reviewed patient's vital signs, intake and output, medications, labs and radiology results.  General: alert, cooperative, no distress and morbidly obese Resp: clear to auscultation bilaterally Cardio: regular rate and rhythm GI: Soft, non distended, positive tenderness in the lower quadrants (left greater than right), no rebound, no guarding Extremities: extremities normal, atraumatic, no cyanosis or edema   Assessment/Plan: 49 yo with recurrent Left TOA - Continue IV antibiotics - Continue pain management - Follow up pelvic ultrasound today - Continue current care     LOS: 1 day    Angelica Ortiz 06/30/2014, 6:37 AM

## 2014-07-01 LAB — CBC WITH DIFFERENTIAL/PLATELET
BASOS PCT: 0 % (ref 0–1)
Basophils Absolute: 0 10*3/uL (ref 0.0–0.1)
EOS PCT: 1 % (ref 0–5)
Eosinophils Absolute: 0.1 10*3/uL (ref 0.0–0.7)
HCT: 25.1 % — ABNORMAL LOW (ref 36.0–46.0)
Hemoglobin: 7.6 g/dL — ABNORMAL LOW (ref 12.0–15.0)
LYMPHS ABS: 1.2 10*3/uL (ref 0.7–4.0)
LYMPHS PCT: 11 % — AB (ref 12–46)
MCH: 20.6 pg — ABNORMAL LOW (ref 26.0–34.0)
MCHC: 30.3 g/dL (ref 30.0–36.0)
MCV: 68 fL — AB (ref 78.0–100.0)
Monocytes Absolute: 1.2 10*3/uL — ABNORMAL HIGH (ref 0.1–1.0)
Monocytes Relative: 11 % (ref 3–12)
NEUTROS PCT: 78 % — AB (ref 43–77)
Neutro Abs: 8.9 10*3/uL — ABNORMAL HIGH (ref 1.7–7.7)
Platelets: 281 10*3/uL (ref 150–400)
RBC: 3.69 MIL/uL — ABNORMAL LOW (ref 3.87–5.11)
RDW: 20.2 % — AB (ref 11.5–15.5)
WBC: 11.5 10*3/uL — ABNORMAL HIGH (ref 4.0–10.5)

## 2014-07-01 LAB — GLUCOSE, CAPILLARY
GLUCOSE-CAPILLARY: 172 mg/dL — AB (ref 70–99)
Glucose-Capillary: 211 mg/dL — ABNORMAL HIGH (ref 70–99)
Glucose-Capillary: 144 mg/dL — ABNORMAL HIGH (ref 70–99)

## 2014-07-01 MED ORDER — OXYCODONE-ACETAMINOPHEN 5-325 MG PO TABS: 1.5000 | ORAL_TABLET | ORAL | Status: DC | PRN

## 2014-07-01 MED ORDER — DOXYCYCLINE HYCLATE 100 MG PO TABS
100.0000 mg | ORAL_TABLET | Freq: Two times a day (BID) | ORAL | Status: DC
  Administered 2014-07-01 – 2014-07-02 (×2): 100 mg via ORAL
  Filled 2014-07-01 (×4): qty 1

## 2014-07-01 MED ORDER — MORPHINE SULFATE 4 MG/ML IJ SOLN
2.0000 mg | INTRAMUSCULAR | Status: DC | PRN
  Administered 2014-07-01: 2 mg via INTRAVENOUS
  Filled 2014-07-01: qty 1

## 2014-07-01 MED ORDER — OXYCODONE-ACETAMINOPHEN 5-325 MG PO TABS
1.5000 | ORAL_TABLET | Freq: Four times a day (QID) | ORAL | Status: DC
  Administered 2014-07-01 – 2014-07-02 (×5): 1.5 via ORAL
  Filled 2014-07-01 (×4): qty 2

## 2014-07-01 NOTE — Progress Notes (Signed)
Patient ID: Angelica Ortiz, female   DOB: 07/26/1965, 49 y.o.   MRN: [id number] Pt is well known to me from prev hospitalization and ofc follow up.  This is her second TOA in the same location.  She is interested in surgery and on this hospitalization if possible. The pt and her spouse report that she is markedly improved since admission but she still in pain.  She reports that the upper abd pain resolved well with pain meds but the lower pelvic pain still persists but is improved since admission.  She denies N/V but reports a decreased appetite.  I have reviewed the risks of operating on inflamed tissue and the patient expresses understanding of not performing the surgery at present.  Pt is already a surgical risk due to her size; medical co-morbidities and multiple surgeries.    Rec 2 weeks of Augmentin and Flagyl upon discharge for 2 weeks.  Rec outpt f/u and subsequent surgical TAH/BSO after several weeks.  Pt agrees with POC.   I will see her in the am.  Rec discharge to home when she is off IV pain meds.   I will see her as an outpt in 2 weeks as long as her sx continue to improve.   All of her and her husbands questions were answered.  Taige Housman L. Harraway-Smith, M.D., [facility] CoreFAC[facility]

## 2014-07-01 NOTE — Progress Notes (Signed)
Subjective: Patient reports no fevers.  Tolerating PO fluids and food.  Continues to complain of LLQ abdominal pain that is worse with movement and better with rest.  No radiation of pain.  Generalized abdominal pain improved.    Objective: I have reviewed patient's vital signs, medications, labs and microbiology. BP 131/70 mmHg  Pulse 111  Temp(Src) 98.7 F (37.1 C) (Oral)  Resp 22  Ht 5\' 6"  (1.676 m)  Wt 369 lb 1 oz (167.406 kg)  BMI 59.60 kg/m2  SpO2 100%  General: alert, cooperative and no distress GI: soft, non-tender; bowel sounds normal; no masses,  no organomegaly Extremities: extremities normal, atraumatic, no cyanosis or edema and Homans sign is negative, no sign of DVT  CBC Latest Ref Rng 06/29/2014 06/28/2014 03/09/2014  WBC 4.0 - 10.5 K/uL 16.1(H) 16.2(H) 8.3  Hemoglobin 12.0 - 15.0 g/dL 8.3(L) 8.7(L) 9.6(L)  Hematocrit 36.0 - 46.0 % 27.1(L) 28.9(L) 31.0(L)  Platelets 150 - 400 K/uL 292 319 375   [place] Transvaginal Non-ob  06/30/2014   CLINICAL DATA:  Evaluate TOA.  EXAM: TRANSABDOMINAL AND TRANSVAGINAL ULTRASOUND OF PELVIS  TECHNIQUE: Both transabdominal and transvaginal ultrasound examinations of the pelvis were performed. Transabdominal technique was performed for global imaging of the pelvis including uterus, ovaries, adnexal regions, and pelvic cul-de-sac. It was necessary to proceed with endovaginal exam following the transabdominal exam to visualize the uterus, endometrium and adnexal structures.  COMPARISON:  Ultrasound 06/28/2014  FINDINGS: Uterus  Measurements: 12.8 x 6.6 x 5.4 cm. No fibroids or other mass visualized.  Endometrium  Thickness: 10.7 mm.  No focal abnormality visualized.  Right ovary  Measurements: Not visualize. No adnexal mass noted.  Left ovary  Measurements: Not visualize. Complex left adnexal mass measures 7.1 x 4.2 x 4.0 cm.  Other findings  Exam detail is significantly diminished secondary to the patient's body habitus. Nonvisualization of the ovaries.   IMPRESSION: 1. Exam detail is diminished secondary to patient's body habitus. 2. Left adnexal mass consistent with tubo-ovarian abscess measures 7.1 x 4.2 x 4.0 cm.   Electronically Signed   By: Signa Kellaylor  Stroud M.D.   On: 06/30/2014 09:30   [place] Pelvis Complete  06/30/2014   CLINICAL DATA:  Evaluate TOA.  EXAM: TRANSABDOMINAL AND TRANSVAGINAL ULTRASOUND OF PELVIS  TECHNIQUE: Both transabdominal and transvaginal ultrasound examinations of the pelvis were performed. Transabdominal technique was performed for global imaging of the pelvis including uterus, ovaries, adnexal regions, and pelvic cul-de-sac. It was necessary to proceed with endovaginal exam following the transabdominal exam to visualize the uterus, endometrium and adnexal structures.  COMPARISON:  Ultrasound 06/28/2014  FINDINGS: Uterus  Measurements: 12.8 x 6.6 x 5.4 cm. No fibroids or other mass visualized.  Endometrium  Thickness: 10.7 mm.  No focal abnormality visualized.  Right ovary  Measurements: Not visualize. No adnexal mass noted.  Left ovary  Measurements: Not visualize. Complex left adnexal mass measures 7.1 x 4.2 x 4.0 cm.  Other findings  Exam detail is significantly diminished secondary to the patient's body habitus. Nonvisualization of the ovaries.  IMPRESSION: 1. Exam detail is diminished secondary to patient's body habitus. 2. Left adnexal mass consistent with tubo-ovarian abscess measures 7.1 x 4.2 x 4.0 cm.   Electronically Signed   By: Signa Kellaylor  Stroud M.D.   On: 06/30/2014 09:30    Assessment/Plan: 1.  TOA  Afebrile x >48 hours WBC on 3/24 shows no improvement.  CBC pending for this morning. Although pain is more localized, there has been very little improvement IR has  determined that CT guided drainage would be difficult   Continue IV antibiotics   LOS: 2 days    STINSON, JACOB JEHIEL 07/01/2014, 7:04 AM

## 2014-07-02 DIAGNOSIS — D649 Anemia, unspecified: Secondary | ICD-10-CM

## 2014-07-02 DIAGNOSIS — R102 Pelvic and perineal pain: Secondary | ICD-10-CM | POA: Diagnosis not present

## 2014-07-02 DIAGNOSIS — Z6841 Body Mass Index (BMI) 40.0 and over, adult: Secondary | ICD-10-CM

## 2014-07-02 DIAGNOSIS — I10 Essential (primary) hypertension: Secondary | ICD-10-CM | POA: Diagnosis not present

## 2014-07-02 LAB — GLUCOSE, CAPILLARY
GLUCOSE-CAPILLARY: 91 mg/dL (ref 70–99)
Glucose-Capillary: 134 mg/dL — ABNORMAL HIGH (ref 70–99)

## 2014-07-02 MED ORDER — MORPHINE SULFATE 4 MG/ML IJ SOLN
2.0000 mg | INTRAMUSCULAR | Status: DC | PRN
  Administered 2014-07-02 (×2): 2 mg via INTRAMUSCULAR
  Filled 2014-07-02 (×2): qty 1

## 2014-07-02 MED ORDER — AMOXICILLIN-POT CLAVULANATE 875-125 MG PO TABS
1.0000 | ORAL_TABLET | Freq: Two times a day (BID) | ORAL | Status: DC
Start: 2014-07-02 — End: 2014-07-19

## 2014-07-02 MED ORDER — OXYCODONE HCL ER 10 MG PO T12A
10.0000 mg | EXTENDED_RELEASE_TABLET | Freq: Two times a day (BID) | ORAL | Status: DC
  Filled 2014-07-02 (×3): qty 1

## 2014-07-02 MED ORDER — METRONIDAZOLE 500 MG PO TABS: 500.0000 mg | ORAL_TABLET | Freq: Two times a day (BID) | ORAL | Status: AC

## 2014-07-02 MED ORDER — OXYCODONE HCL ER 10 MG PO T12A: 10.0000 mg | EXTENDED_RELEASE_TABLET | Freq: Two times a day (BID) | ORAL | Status: DC

## 2014-07-02 NOTE — Progress Notes (Signed)
Patient discharged via wheelchair to home.

## 2014-07-02 NOTE — Discharge Summary (Signed)
Physician Discharge Summary  Patient ID: Angelica Ortiz MRN: [id number] DOB/AGE: 47/09/1965 47 y.o.  Admit date: 06/28/2014 Discharge date: 07/02/2014  Admission Diagnoses: TOA- recurrent  Discharge Diagnoses:  Active Problems:   TOA (tubo-ovarian abscess)   Tubo-ovarian abscess   Discharged Condition: good  Hospital Course: Pt was admitted with increased pelvic pain.  She reports that it was severe and it is markedly improved since admission.  She has been able to tolerate a regular diet.  She denies N/V/  She still c/o of some lower pelvic pain.  She lost her IV yesterday and anesthesia was unable to replace it without several attempts.  She tolerated the pain overnight with 2 doses of IM Morphine.  She reports that she is allergic to Dilaudid but, could tolerate Oxycontin.  She is on scheduled Percocet due to pain from Fibromyalgia.  Pt feels that she can go home on pain medications by mouth.  She is aware that surgery wil be scheduled in the future butm she has to have surgical clearance prior to that.  She reports that she will start making those appts.         Consults: None  Significant Diagnostic Studies: CT and sono of pelvic  Treatments: antibiotics: Cefotan and doxycycline  Discharge Exam: Blood pressure 129/80, pulse 110, temperature 99.1 F (37.3 C), temperature source Oral, resp. rate 20, height 5\' 6"  (1.676 m), weight 369 lb 1 oz (167.406 kg), SpO2 100 %. General appearance: alert and no distress Resp: clear to auscultation bilaterally Cardio: regular rate and rhythm, S1, S2 normal, no murmur, click, rub or gallop Extremities: extremities normal, atraumatic, no cyanosis or edema Abd/pelvis: morbidly obese; slightly tender in lower abd.  NO rebound or guarding   Disposition: 01-Home or Self Care  Discharge Instructions    Call MD for:  difficulty breathing, headache or visual disturbances    Complete by:  As directed      Call MD for:  extreme fatigue    Complete  by:  As directed      Call MD for:  hives    Complete by:  As directed      Call MD for:  persistant dizziness or light-headedness    Complete by:  As directed      Call MD for:  persistant nausea and vomiting    Complete by:  As directed      Call MD for:  redness, tenderness, or signs of infection (pain, swelling, redness, odor or green/yellow discharge around incision site)    Complete by:  As directed      Call MD for:  severe uncontrolled pain    Complete by:  As directed      Call MD for:  temperature >100.4    Complete by:  As directed      Diet - low sodium heart healthy    Complete by:  As directed      Diet Carb Modified    Complete by:  As directed      Increase activity slowly    Complete by:  As directed             Medication List    TAKE these medications        acetaminophen 500 MG tablet  Commonly known as:  TYLENOL  Take 1,000 mg by mouth every 6 (six) hours as needed for fever (fever).     albuterol 108 (90 BASE) MCG/ACT inhaler  Commonly known as:  PROVENTIL HFA;VENTOLIN HFA  Inhale 2 puffs into the lungs 2 (two) times daily.     NORVASC PO  Take 20 mg by mouth at bedtime.     amLODipine 10 MG tablet  Commonly known as:  NORVASC  Take 1 tablet (10 mg total) by mouth daily.     amoxicillin-clavulanate 875-125 MG per tablet  Commonly known as:  AUGMENTIN  Take 1 tablet by mouth 2 (two) times daily.     budesonide-formoterol 160-4.5 MCG/ACT inhaler  Commonly known as:  SYMBICORT  Inhale 2 puffs into the lungs 2 (two) times daily.     diphenhydrAMINE 25 MG tablet  Commonly known as:  BENADRYL  Take 1 tablet (25 mg total) by mouth every 6 (six) hours.     famotidine 20 MG tablet  Commonly known as:  PEPCID  Take 1 tablet (20 mg total) by mouth 2 (two) times daily.     ferrous sulfate 325 (65 FE) MG tablet  Take 325 mg by mouth 3 (three) times daily with meals.     fluticasone 50 MCG/ACT nasal spray  Commonly known as:  FLONASE  Place 2  sprays into both nostrils daily.     glipiZIDE 10 MG tablet  Commonly known as:  GLUCOTROL  Take 10 mg by mouth 2 (two) times daily before a meal.     ibuprofen 600 MG tablet  Commonly known as:  ADVIL,MOTRIN  Take 1 tablet (600 mg total) by mouth every 6 (six) hours as needed.     losartan-hydrochlorothiazide 100-25 MG per tablet  Commonly known as:  HYZAAR  Take 1 tablet by mouth daily.     megestrol 40 MG tablet  Commonly known as:  MEGACE  Take 40 mg by mouth 2 (two) times daily.     metFORMIN 1000 MG tablet  Commonly known as:  GLUCOPHAGE  Take 1,000 mg by mouth 2 (two) times daily with a meal.     metroNIDAZOLE 500 MG tablet  Commonly known as:  FLAGYL  Take 1 tablet (500 mg total) by mouth 2 (two) times daily.     montelukast 10 MG tablet  Commonly known as:  SINGULAIR  Take 10 mg by mouth at bedtime.     omeprazole 40 MG capsule  Commonly known as:  PRILOSEC  Take 40 mg by mouth every evening.     oxybutynin 10 MG 24 hr tablet  Commonly known as:  DITROPAN-XL  Take 10 mg by mouth at bedtime.     OxyCODONE 10 mg T12a 12 hr tablet  Commonly known as:  OXYCONTIN  Take 1 tablet (10 mg total) by mouth every 12 (twelve) hours.     oxyCODONE-acetaminophen 7.5-325 MG per tablet  Commonly known as:  PERCOCET  Take 1 tablet by mouth 3 (three) times daily.     predniSONE 50 MG tablet  Commonly known as:  DELTASONE  Take 1 tablet (50 mg total) by mouth daily.     pregabalin 75 MG capsule  Commonly known as:  LYRICA  Take 75 mg by mouth 3 (three) times daily.     rizatriptan 10 MG tablet  Commonly known as:  MAXALT  Take 10 mg by mouth 2 (two) times daily as needed for migraine (migraine). May repeat in 2 hours if needed     sitaGLIPtin 100 MG tablet  Commonly known as:  JANUVIA  Take 100 mg by mouth daily.     terbinafine 250 MG tablet  Commonly known as:  LAMISIL  Take 250  mg by mouth daily.           Follow-up Information    Follow up with  Willodean Rosenthal, MD In 2 weeks.   Specialty:  Obstetrics and Gynecology   Contact information:   [address] [place] [address] [phone]     TOA improved from admission.  Because it is recurrent pt will most likely require surgery.  This will be weighed against the surgical risk due to her medical comrbidities including DM, sleep apnea, HTN and obesity.    SignedWillodean Rosenthal 07/02/2014, 7:28 AM

## 2014-07-02 NOTE — Discharge Instructions (Signed)
Pelvic Inflammatory Disease °Pelvic inflammatory disease (PID) refers to an infection in some or all of the female organs. The infection can be in the uterus, ovaries, fallopian tubes, or the surrounding tissues in the pelvis. PID can cause abdominal or pelvic pain that comes on suddenly (acute pelvic pain). PID is a serious infection because it can lead to lasting (chronic) pelvic pain or the inability to have children (infertile).  °CAUSES  °The infection is often caused by the normal bacteria found in the vaginal tissues. PID may also be caused by an infection that is spread during sexual contact. PID can also occur following:  °· The birth of a baby.   °· A miscarriage.   °· An abortion.   °· Major pelvic surgery.   °· The use of an intrauterine device (IUD).   °· A sexual assault.   °RISK FACTORS °Certain factors can put a person at higher risk for PID, such as: °· Being younger than 25 years. °· Being sexually active at a young age. °· Using nonbarrier contraception. °· Having multiple sexual partners. °· Having sex with someone who has symptoms of a genital infection. °· Using oral contraception. °Other times, certain behaviors can increase the possibility of getting PID, such as: °· Having sex during your period. °· Using a vaginal douche. °· Having an intrauterine device (IUD) in place. °SYMPTOMS  °· Abdominal or pelvic pain.   °· Fever.   °· Chills.   °· Abnormal vaginal discharge. °· Abnormal uterine bleeding.   °· Unusual pain shortly after finishing your period. °DIAGNOSIS  °Your caregiver will choose some of the following methods to make a diagnosis, such as:  °· Performing a physical exam and history. A pelvic exam typically reveals a very tender uterus and surrounding pelvis.   °· Ordering laboratory tests including a pregnancy test, blood tests, and urine test.  °· Ordering cultures of the vagina and cervix to check for a sexually transmitted infection (STI). °· Performing an ultrasound.    °· Performing a laparoscopic procedure to look inside the pelvis.   °TREATMENT  °· Antibiotic medicines may be prescribed and taken by mouth.   °· Sexual partners may be treated when the infection is caused by a sexually transmitted disease (STD).   °· Hospitalization may be needed to give antibiotics intravenously. °· Surgery may be needed, but this is rare. °It may take weeks until you are completely well. If you are diagnosed with PID, you should also be checked for human immunodeficiency virus (HIV).   °HOME CARE INSTRUCTIONS  °· If given, take your antibiotics as directed. Finish the medicine even if you start to feel better.   °· Only take over-the-counter or prescription medicines for pain, discomfort, or fever as directed by your caregiver.   °· Do not have sexual intercourse until treatment is completed or as directed by your caregiver. If PID is confirmed, your recent sexual partner(s) will need treatment.   °· Keep your follow-up appointments. °SEEK MEDICAL CARE IF:  °· You have increased or abnormal vaginal discharge.   °· You need prescription medicine for your pain.   °· You vomit.   °· You cannot take your medicines.   °· Your partner has an STD.   °SEEK IMMEDIATE MEDICAL CARE IF:  °· You have a fever.   °· You have increased abdominal or pelvic pain.   °· You have chills.   °· You have pain when you urinate.   °· You are not better after 72 hours following treatment.   °MAKE SURE YOU:  °· Understand these instructions. °· Will watch your condition. °· Will get help right away if you are not doing well or get worse. °  Document Released: 03/24/2005 Document Revised: 07/19/2012 Document Reviewed: 03/20/2011 °[employer]® Patient Information ©2015 [employer]. This information is not intended to replace advice given to you by your health care provider. Make sure you discuss any questions you have with your health care provider. ° °

## 2014-07-03 LAB — GLUCOSE, CAPILLARY: Glucose-Capillary: 174 mg/dL — ABNORMAL HIGH (ref 70–99)

## 2014-07-05 LAB — CULTURE, BLOOD (ROUTINE X 2)
CULTURE: NO GROWTH
Culture: NO GROWTH

## 2014-07-06 ENCOUNTER — Telehealth: Payer: Self-pay

## 2014-07-06 NOTE — Telephone Encounter (Signed)
Pt called stating she has been in executing pain experiencing bleeding and discharge since her discharge from hospital on Sunday. Would like to know what her provider advises.   Called pt and discussed symptoms pt states she was experiencing excruciating pain last night and took Percocet ended up falling asleep after 2hrs. Pt woke up with discharge and bright red bleeding @ 630 am. Reports she is taking all prescribed medications appropriately; percocet q6h, megace bid, flagyl, augmentin. Informed pt we will discuss with [facility] and plan to call back.  [facility] states:  Pt should present to [facility] if develops fever-chills, or increase of pain. Otherwise pt is to continue medication regimen and know discharge and bleeding is normal.    Called patient. Denies fever at this time. Informed her if she develops fever chills or increase of pain she should present to [facility]U otherwise continue medication regimen. Reassured her that current symptoms are not abnormal. Pt verbalizes understanding and has no further questions.

## 2014-07-14 ENCOUNTER — Encounter (HOSPITAL_COMMUNITY): Payer: Self-pay | Admitting: *Deleted

## 2014-07-14 ENCOUNTER — Inpatient Hospital Stay (HOSPITAL_COMMUNITY): Payer: Medicare Other

## 2014-07-14 ENCOUNTER — Inpatient Hospital Stay (HOSPITAL_COMMUNITY)
Admission: AD | Admit: 2014-07-14 | Discharge: 2014-07-14 | Disposition: A | Discharge status: Home / Self Care | Payer: Medicare Other | Source: Ambulatory Visit | Attending: Family Medicine | Admitting: Family Medicine

## 2014-07-14 ENCOUNTER — Telehealth: Payer: Self-pay | Admitting: Obstetrics and Gynecology

## 2014-07-14 DIAGNOSIS — N939 Abnormal uterine and vaginal bleeding, unspecified: Secondary | ICD-10-CM | POA: Diagnosis present

## 2014-07-14 DIAGNOSIS — R102 Pelvic and perineal pain: Secondary | ICD-10-CM | POA: Diagnosis not present

## 2014-07-14 DIAGNOSIS — E119 Type 2 diabetes mellitus without complications: Secondary | ICD-10-CM

## 2014-07-14 DIAGNOSIS — M797 Fibromyalgia: Secondary | ICD-10-CM | POA: Insufficient documentation

## 2014-07-14 DIAGNOSIS — N7093 Salpingitis and oophoritis, unspecified: Secondary | ICD-10-CM

## 2014-07-14 DIAGNOSIS — N920 Excessive and frequent menstruation with regular cycle: Secondary | ICD-10-CM | POA: Insufficient documentation

## 2014-07-14 DIAGNOSIS — Z86711 Personal history of pulmonary embolism: Secondary | ICD-10-CM | POA: Insufficient documentation

## 2014-07-14 DIAGNOSIS — K219 Gastro-esophageal reflux disease without esophagitis: Secondary | ICD-10-CM | POA: Diagnosis not present

## 2014-07-14 LAB — CBC
HCT: 28 % — ABNORMAL LOW (ref 36.0–46.0)
Hemoglobin: 8.4 g/dL — ABNORMAL LOW (ref 12.0–15.0)
MCH: 20 pg — ABNORMAL LOW (ref 26.0–34.0)
MCHC: 30 g/dL (ref 30.0–36.0)
MCV: 66.7 fL — ABNORMAL LOW (ref 78.0–100.0)
Platelets: 407 10*3/uL — ABNORMAL HIGH (ref 150–400)
RBC: 4.2 MIL/uL (ref 3.87–5.11)
RDW: 20.8 % — ABNORMAL HIGH (ref 11.5–15.5)
WBC: 8 10*3/uL (ref 4.0–10.5)

## 2014-07-14 MED ORDER — DICLOFENAC SODIUM ER 100 MG PO TB24: 100.0000 mg | ORAL_TABLET | Freq: Two times a day (BID) | ORAL | Status: DC

## 2014-07-14 MED ORDER — LACTATED RINGERS IV SOLN
INTRAVENOUS | Status: DC
Start: 2014-07-14 — End: 2014-07-15
  Administered 2014-07-14: 20:00:00 via INTRAVENOUS

## 2014-07-14 MED ORDER — MEGESTROL ACETATE 40 MG PO TABS
80.0000 mg | ORAL_TABLET | Freq: Two times a day (BID) | ORAL | Status: DC
Start: 2014-07-14 — End: 2014-07-26

## 2014-07-14 MED ORDER — IOHEXOL 300 MG/ML  SOLN: 50.0000 mL | INTRAMUSCULAR | Status: AC

## 2014-07-14 MED ORDER — DICLOFENAC SODIUM 75 MG PO TBEC
75.0000 mg | DELAYED_RELEASE_TABLET | Freq: Two times a day (BID) | ORAL | Status: DC
Start: 2014-07-14 — End: 2014-10-23

## 2014-07-14 MED ORDER — IOHEXOL 300 MG/ML  SOLN
100.0000 mL | Freq: Once | INTRAMUSCULAR | Status: AC | PRN
  Administered 2014-07-14: 100 mL via INTRAVENOUS

## 2014-07-14 MED ORDER — DIPHENHYDRAMINE HCL 50 MG/ML IJ SOLN
25.0000 mg | Freq: Once | INTRAMUSCULAR | Status: AC
  Administered 2014-07-14: 25 mg via INTRAVENOUS
  Filled 2014-07-14: qty 1

## 2014-07-14 MED ORDER — OXYCODONE-ACETAMINOPHEN 5-325 MG PO TABS
2.0000 | ORAL_TABLET | Freq: Once | ORAL | Status: AC
  Administered 2014-07-14: 2 via ORAL
  Filled 2014-07-14: qty 2

## 2014-07-14 MED ORDER — KETOROLAC TROMETHAMINE 60 MG/2ML IM SOLN
60.0000 mg | Freq: Once | INTRAMUSCULAR | Status: AC
  Administered 2014-07-14: 60 mg via INTRAMUSCULAR
  Filled 2014-07-14: qty 2

## 2014-07-14 MED ORDER — METHYLPREDNISOLONE SODIUM SUCC 125 MG IJ SOLR
60.0000 mg | Freq: Once | INTRAMUSCULAR | Status: AC
  Administered 2014-07-14: 60 mg via INTRAVENOUS
  Filled 2014-07-14: qty 2

## 2014-07-14 MED ORDER — OXYCODONE-ACETAMINOPHEN 7.5-325 MG PO TABS: 1.0000 | ORAL_TABLET | Freq: Every day | ORAL | Status: DC

## 2014-07-14 NOTE — MAU Provider Note (Signed)
History     CSN: [id number]  Arrival date and time: 07/14/14 1604   None     Chief Complaint  Patient presents with  . Vaginal Bleeding  . Abdominal Pain   Vaginal Bleeding The patient's primary symptoms include vaginal bleeding. This is a chronic problem. The current episode started today. The problem occurs constantly. The problem has been gradually worsening. The pain is severe. The problem affects both sides. She is not pregnant. Associated symptoms include abdominal pain. Pertinent negatives include no vomiting. The vaginal bleeding is heavier than menses. She has been passing clots. She has not been passing tissue. She has tried oral narcotics for the symptoms. The treatment provided no relief. Her past medical history is significant for ovarian cysts and perineal abscess.  Abdominal Pain Pertinent negatives include no vomiting.    49 y.o. [id number] presents to the MAU stating that she had heavy bleeding starting at 200am  And severe pelvic pain not relieved by her percocet.  Past Medical History  Diagnosis Date  . Diverticulitis   . Asthma   . Hypertension   . Fibromyalgia   . Lupus     no meds  . GERD (gastroesophageal reflux disease)   . Overactive bladder   . Migraine   . Morbid obesity 03/30/2013  . Pulmonary embolism     Resolved -At age 44 broken right leg, blood clot broke off - into lung  . Diabetes mellitus without complication     Type 2  . Anemia   . Overactive bladder   . Seasonal allergies   . Sleep apnea 3/15    Does not have CPAP machine    Past Surgical History  Procedure Laterality Date  . Cholecystectomy    . Appendectomy    . Cesarean section  2001    x 1  . Rotator cuff repair      right  . Laminectomy      L4-L5  . Orif ankle fracture Left   . Dilation and curettage of uterus      x 3 for AUB  . Laparoscopic ovarian cystectomy Right 1994  . Tonsillectomy    . Hysteroscopy with novasure N/A 03/14/2014    Procedure: HYSTEROSCOPY WITH  NOVASURE;  Surgeon: Willodean Rosenthal, MD;  Location: [facility];  Service: Gynecology;  Laterality: N/A;    Family History  Problem Relation Age of Onset  . Diabetes Mother   . Hypertension Mother   . Heart disease Mother   . Cancer Father   . Heart disease Father   . Hypertension Sister   . Cancer Sister   . Diabetes Brother   . Hypertension Brother   . Cancer Maternal Uncle   . Cancer Paternal Uncle     History  Substance Use Topics  . Smoking status: Never Smoker   . Smokeless tobacco: Never Used  . Alcohol Use: No    Allergies:  Allergies  Allergen Reactions  . Dilaudid [Hydromorphone Hcl] Hives and Itching  . Levaquin [Levofloxacin In D5w] Hives, Shortness Of Breath and Itching  . Tangerine Flavor Swelling    Lip and tongue swelling with tangerines  . Ace Inhibitors Swelling    Prescriptions prior to admission  Medication Sig Dispense Refill Last Dose  . acetaminophen (TYLENOL) 500 MG tablet Take 1,000 mg by mouth every 6 (six) hours as needed for fever (fever).   Past Month at Unknown time  . albuterol (PROVENTIL HFA;VENTOLIN HFA) 108 (90 BASE) MCG/ACT inhaler Inhale 2 puffs into  the lungs 2 (two) times daily.   07/14/2014 at Unknown time  . amLODipine (NORVASC) 10 MG tablet Take 1 tablet (10 mg total) by mouth daily. 14 tablet 0 07/13/2014 at Unknown time  . amoxicillin-clavulanate (AUGMENTIN) 875-125 MG per tablet Take 1 tablet by mouth 2 (two) times daily. 28 tablet 0 07/14/2014 at Unknown time  . budesonide-formoterol (SYMBICORT) 160-4.5 MCG/ACT inhaler Inhale 2 puffs into the lungs 2 (two) times daily.    07/14/2014 at Unknown time  . famotidine (PEPCID) 20 MG tablet Take 1 tablet (20 mg total) by mouth 2 (two) times daily. (Patient taking differently: Take 20 mg by mouth daily. ) 10 tablet 0 07/13/2014 at Unknown time  . ferrous sulfate 325 (65 FE) MG tablet Take 325 mg by mouth 3 (three) times daily with meals.   07/13/2014 at Unknown time  . fluticasone (FLONASE) 50  MCG/ACT nasal spray Place 2 sprays into both nostrils daily.    07/13/2014 at Unknown time  . glipiZIDE (GLUCOTROL) 10 MG tablet Take 10 mg by mouth 2 (two) times daily before a meal.    07/14/2014 at Unknown time  . losartan-hydrochlorothiazide (HYZAAR) 100-25 MG per tablet Take 1 tablet by mouth daily.   07/13/2014 at Unknown time  . megestrol (MEGACE) 40 MG tablet Take 40 mg by mouth 2 (two) times daily.   07/13/2014 at Unknown time  . metFORMIN (GLUCOPHAGE) 1000 MG tablet Take 1,000 mg by mouth 2 (two) times daily with a meal.   07/14/2014 at Unknown time  . metroNIDAZOLE (FLAGYL) 500 MG tablet Take 500 mg by mouth 2 (two) times daily.   07/14/2014 at Unknown time  . montelukast (SINGULAIR) 10 MG tablet Take 10 mg by mouth at bedtime.   07/13/2014 at Unknown time  . omeprazole (PRILOSEC) 40 MG capsule Take 40 mg by mouth every evening.   07/13/2014 at Unknown time  . oxybutynin (DITROPAN-XL) 10 MG 24 hr tablet Take 10 mg by mouth at bedtime.    07/13/2014 at Unknown time  . OxyCODONE (OXYCONTIN) 10 mg T12A 12 hr tablet Take 1 tablet (10 mg total) by mouth every 12 (twelve) hours. 20 tablet 0 Past Week at Unknown time  . oxyCODONE-acetaminophen (PERCOCET) 7.5-325 MG per tablet Take 1 tablet by mouth 3 (three) times daily.    07/14/2014 at Unknown time  . predniSONE (DELTASONE) 50 MG tablet Take 1 tablet (50 mg total) by mouth daily. 5 tablet 0 07/13/2014 at Unknown time  . pregabalin (LYRICA) 75 MG capsule Take 75 mg by mouth 3 (three) times daily.    07/14/2014 at Unknown time  . rizatriptan (MAXALT) 10 MG tablet Take 10 mg by mouth 2 (two) times daily as needed for migraine (migraine). May repeat in 2 hours if needed   Past Week at Unknown time  . sitaGLIPtin (JANUVIA) 100 MG tablet Take 100 mg by mouth daily.   07/14/2014 at Unknown time  . terbinafine (LAMISIL) 250 MG tablet Take 250 mg by mouth daily.   07/13/2014 at Unknown time  . diphenhydrAMINE (BENADRYL) 25 MG tablet Take 1 tablet (25 mg total) by mouth every 6  (six) hours. (Patient not taking: Reported on 07/14/2014) 20 tablet 0 Past Month at Unknown time  . ibuprofen (ADVIL,MOTRIN) 600 MG tablet Take 1 tablet (600 mg total) by mouth every 6 (six) hours as needed. (Patient not taking: Reported on 06/02/2014) 30 tablet 1 more than one month    Review of Systems  Constitutional: Negative.   HENT: Negative.  Eyes: Negative.   Respiratory: Negative.   Gastrointestinal: Positive for abdominal pain. Negative for vomiting.  Genitourinary: Positive for vaginal bleeding.  Musculoskeletal: Negative.   Skin: Negative.   Neurological: Negative.   Psychiatric/Behavioral: Negative.    Physical Exam   Blood pressure 150/82, pulse 105, temperature 98.9 F (37.2 C), resp. rate 22.  Physical Exam  Nursing note and vitals reviewed. Constitutional: She is oriented to person, place, and time. She appears well-developed and well-nourished. No distress.  HENT:  Head: Normocephalic.  Neck: Normal range of motion.  Cardiovascular: Normal rate and regular rhythm.   Respiratory: Effort normal. No respiratory distress.  GI: Soft.  Genitourinary: Right adnexum displays tenderness. There is bleeding in the vagina.  Neurological: She is alert and oriented to person, place, and time.  Skin: Skin is warm and dry.  Psychiatric: She has a normal mood and affect. Her behavior is normal. Judgment and thought content normal.   [place] Transvaginal Non-ob  07/14/2014   CLINICAL DATA:  Followup left tubo-ovarian abscess. Previous endometrial ablation. Obese.  EXAM: TRANSABDOMINAL AND TRANSVAGINAL ULTRASOUND OF PELVIS  TECHNIQUE: Both transabdominal and transvaginal ultrasound examinations of the pelvis were performed. Transabdominal technique was performed for global imaging of the pelvis including uterus, ovaries, adnexal regions, and pelvic cul-de-sac. It was necessary to proceed with endovaginal exam following the transabdominal exam to visualize the endometrium and ovaries.   COMPARISON:  06/30/2014 and CT 06/28/2014  FINDINGS: Examination limited due to patient body habitus.  Uterus  Measurements: 6.1 x 6.6 x 12.1 cm. No fibroids or other mass visualized.  Endometrium  Thickness: 3.6 mm.  No focal abnormality visualized.  Right ovary  Not visualized.  Left ovary  Oval somewhat heterogeneous structure in the left adnexa measuring 3.6 x 3.5 x 4.8 cm which may represent the left ovary/ tubo-ovarian abscess decreased in size from previous at which time it measured 4 x 4.2 x 7.1 cm.  Other findings  No free fluid.  IMPRESSION: Oval heterogeneous structure in the left adnexa measuring 3.5 x 3.6 x 4.8 cm likely the left ovary which has significantly decreased in size as previously measured 4 x 4.2 x 7.1 cm. When correlated clinically, this likely represents improving TOA.   Electronically Signed   By: Elberta Fortisaniel  Boyle M.D.   On: 07/14/2014 20:15   [place] Pelvis Complete  07/14/2014   CLINICAL DATA:  Followup left tubo-ovarian abscess. Previous endometrial ablation. Obese.  EXAM: TRANSABDOMINAL AND TRANSVAGINAL ULTRASOUND OF PELVIS  TECHNIQUE: Both transabdominal and transvaginal ultrasound examinations of the pelvis were performed. Transabdominal technique was performed for global imaging of the pelvis including uterus, ovaries, adnexal regions, and pelvic cul-de-sac. It was necessary to proceed with endovaginal exam following the transabdominal exam to visualize the endometrium and ovaries.  COMPARISON:  06/30/2014 and CT 06/28/2014  FINDINGS: Examination limited due to patient body habitus.  Uterus  Measurements: 6.1 x 6.6 x 12.1 cm. No fibroids or other mass visualized.  Endometrium  Thickness: 3.6 mm.  No focal abnormality visualized.  Right ovary  Not visualized.  Left ovary  Oval somewhat heterogeneous structure in the left adnexa measuring 3.6 x 3.5 x 4.8 cm which may represent the left ovary/ tubo-ovarian abscess decreased in size from previous at which time it measured 4 x 4.2 x 7.1 cm.   Other findings  No free fluid.  IMPRESSION: Oval heterogeneous structure in the left adnexa measuring 3.5 x 3.6 x 4.8 cm likely the left ovary which has significantly decreased in size as  previously measured 4 x 4.2 x 7.1 cm. When correlated clinically, this likely represents improving TOA.   Electronically Signed   By: Elberta Fortis M.D.   On: 07/14/2014 20:15   Results for orders placed or performed during the hospital encounter of 07/14/14 (from the past 24 hour(s))  CBC     Status: Abnormal   Collection Time: 07/14/14  5:18 PM  Result Value Ref Range   WBC 8.0 4.0 - 10.5 K/uL   RBC 4.20 3.87 - 5.11 MIL/uL   Hemoglobin 8.4 (L) 12.0 - 15.0 g/dL   HCT 16.1 (L) 09.6 - 04.5 %   MCV 66.7 (L) 78.0 - 100.0 fL   MCH 20.0 (L) 26.0 - 34.0 pg   MCHC 30.0 30.0 - 36.0 g/dL   RDW 40.9 (H) 81.1 - 91.4 %   Platelets 407 (H) 150 - 400 K/uL    US Transvaginal Non-ob  07/14/2014   CLINICAL DATA:  Followup left tubo-ovarian abscess. Previous endometrial ablation. Obese.  EXAM: TRANSABDOMINAL AND TRANSVAGINAL ULTRASOUND OF PELVIS  TECHNIQUE: Both transabdominal and transvaginal ultrasound examinations of the pelvis were performed. Transabdominal technique was performed for global imaging of the pelvis including uterus, ovaries, adnexal regions, and pelvic cul-de-sac. It was necessary to proceed with endovaginal exam following the transabdominal exam to visualize the endometrium and ovaries.  COMPARISON:  06/30/2014 and CT 06/28/2014  FINDINGS: Examination limited due to patient body habitus.  Uterus  Measurements: 6.1 x 6.6 x 12.1 cm. No fibroids or other mass visualized.  Endometrium  Thickness: 3.6 mm.  No focal abnormality visualized.  Right ovary  Not visualized.  Left ovary  Oval somewhat heterogeneous structure in the left adnexa measuring 3.6 x 3.5 x 4.8 cm which may represent the left ovary/ tubo-ovarian abscess decreased in size from previous at which time it measured 4 x 4.2 x 7.1 cm.  Other findings  No  free fluid.  IMPRESSION: Oval heterogeneous structure in the left adnexa measuring 3.5 x 3.6 x 4.8 cm likely the left ovary which has significantly decreased in size as previously measured 4 x 4.2 x 7.1 cm. When correlated clinically, this likely represents improving TOA.   Electronically Signed   By: Elberta Fortis M.D.   On: 07/14/2014 20:15   US Pelvis Complete  07/14/2014   CLINICAL DATA:  Followup left tubo-ovarian abscess. Previous endometrial ablation. Obese.  EXAM: TRANSABDOMINAL AND TRANSVAGINAL ULTRASOUND OF PELVIS  TECHNIQUE: Both transabdominal and transvaginal ultrasound examinations of the pelvis were performed. Transabdominal technique was performed for global imaging of the pelvis including uterus, ovaries, adnexal regions, and pelvic cul-de-sac. It was necessary to proceed with endovaginal exam following the transabdominal exam to visualize the endometrium and ovaries.  COMPARISON:  06/30/2014 and CT 06/28/2014  FINDINGS: Examination limited due to patient body habitus.  Uterus  Measurements: 6.1 x 6.6 x 12.1 cm. No fibroids or other mass visualized.  Endometrium  Thickness: 3.6 mm.  No focal abnormality visualized.  Right ovary  Not visualized.  Left ovary  Oval somewhat heterogeneous structure in the left adnexa measuring 3.6 x 3.5 x 4.8 cm which may represent the left ovary/ tubo-ovarian abscess decreased in size from previous at which time it measured 4 x 4.2 x 7.1 cm.  Other findings  No free fluid.  IMPRESSION: Oval heterogeneous structure in the left adnexa measuring 3.5 x 3.6 x 4.8 cm likely the left ovary which has significantly decreased in size as previously measured 4 x 4.2 x 7.1 cm. When correlated  clinically, this likely represents improving TOA.   Electronically Signed   By: Elberta Fortis M.D.   On: 07/14/2014 20:15   Ct Abdomen Pelvis W Contrast  07/14/2014   CLINICAL DATA:  Vaginal bleeding this morning with severe left lower quadrant pain.  EXAM: CT ABDOMEN AND PELVIS WITH  CONTRAST  TECHNIQUE: Multidetector CT imaging of the abdomen and pelvis was performed using the standard protocol following bolus administration of intravenous contrast.  CONTRAST:  OMNIPAQUE IOHEXOL 300 MG/ML  SOLN  COMPARISON:  Ultrasound pelvis 07/14/2014. CT abdomen and pelvis 06/28/2014.  FINDINGS: Minimal dependent changes in the lung bases.  Surgical absence of the gallbladder. The liver, spleen, pancreas, adrenal glands, kidneys, abdominal aorta, and inferior vena cava are unremarkable. Mild prominence of retroperitoneal lymph nodes without pathologic enlargement, likely reactive. Stomach, small bowel, and colon are not abnormally distended. Stool fills the colon. No free air or free fluid in the abdomen. Abdominal wall musculature appears intact.  Pelvis: Left adnexal process again demonstrated with soft tissue changes and stranding in the pelvic fat. This area today measures about 4.6 x 5.9 cm, decreasing in size since previous study. Appearance is consistent with improving infection/tubo-ovarian abscess. No free pelvic fluid collections. No pelvic lymphadenopathy. Uterus is not enlarged. Appendix is surgically absent. Degenerative changes in the spine. No destructive bone lesions.  IMPRESSION: Improving left adnexal lesion and inflammatory stranding in the left pelvis since prior study. No acute process otherwise demonstrated in the abdomen or pelvis. No evidence of bowel obstruction.   Electronically Signed   By: Burman Nieves M.D.   On: 07/14/2014 22:14      MAU Course  Procedures  MDM CBC Pelvic U/S CT abdomen Pelvis with Contrast IV Toradol  Im Percocet Benadryl 50 mg IV for itching and Solumedrol  IV following CT scan.  Discussed POC with Dr Adrian Blackwater  Assessment and Plan  Resolving Tubo-Ovarian Abcess Pelvic Pain Menorraghia   Hold Metformin for 48 hours due to Ct scan Voltarin  BID Follow up with Dr Erin Fulling on Wednesday 07/19/2014 Discharge to  home    Clemmons,Lori Grissett 07/14/2014, 8:27 PM   Patient requested to see physician. Pain continues to be uncontrolled.  Patient requesting surgery.  I discussed with her that her follow up appointment would be to discuss plan of care and that I am unable to take her to surgery tonight.  Additionally, I have no indication to admit her - TOA appears to be resolving, her WBC is normal, and she does not have a fever.    Will increase megace to  BID to help control the bleeding Start Diclofenac Increased percocet 7.5mg  to 5 times daily in attempts to control pain. D/c to home.  Levie Heritage, DO 07/14/2014 11:29 PM

## 2014-07-14 NOTE — Discharge Instructions (Signed)
Abdominal Pain, Women °Abdominal (stomach, pelvic, or belly) pain can be caused by many things. It is important to tell your doctor: °· The location of the pain. °· Does it come and go or is it present all the time? °· Are there things that start the pain (eating certain foods, exercise)? °· Are there other symptoms associated with the pain (fever, nausea, vomiting, diarrhea)? °All of this is helpful to know when trying to find the cause of the pain. °CAUSES  °· Stomach: virus or bacteria infection, or ulcer. °· Intestine: appendicitis (inflamed appendix), regional ileitis (Crohn's disease), ulcerative colitis (inflamed colon), irritable bowel syndrome, diverticulitis (inflamed diverticulum of the colon), or cancer of the stomach or intestine. °· Gallbladder disease or stones in the gallbladder. °· Kidney disease, kidney stones, or infection. °· Pancreas infection or cancer. °· Fibromyalgia (pain disorder). °· Diseases of the female organs: °· Uterus: fibroid (non-cancerous) tumors or infection. °· Fallopian tubes: infection or tubal pregnancy. °· Ovary: cysts or tumors. °· Pelvic adhesions (scar tissue). °· Endometriosis (uterus lining tissue growing in the pelvis and on the pelvic organs). °· Pelvic congestion syndrome (female organs filling up with blood just before the menstrual period). °· Pain with the menstrual period. °· Pain with ovulation (producing an egg). °· Pain with an IUD (intrauterine device, birth control) in the uterus. °· Cancer of the female organs. °· Functional pain (pain not caused by a disease, may improve without treatment). °· Psychological pain. °· Depression. °DIAGNOSIS  °Your doctor will decide the seriousness of your pain by doing an examination. °· Blood tests. °· X-rays. °· Ultrasound. °· CT scan (computed tomography, special type of X-ray). °· MRI (magnetic resonance imaging). °· Cultures, for infection. °· Barium enema (dye inserted in the large intestine, to better view it with  X-rays). °· Colonoscopy (looking in intestine with a lighted tube). °· Laparoscopy (minor surgery, looking in abdomen with a lighted tube). °· Major abdominal exploratory surgery (looking in abdomen with a large incision). °TREATMENT  °The treatment will depend on the cause of the pain.  °· Many cases can be observed and treated at home. °· Over-the-counter medicines recommended by your caregiver. °· Prescription medicine. °· Antibiotics, for infection. °· Birth control pills, for painful periods or for ovulation pain. °· Hormone treatment, for endometriosis. °· Nerve blocking injections. °· Physical therapy. °· Antidepressants. °· Counseling with a psychologist or psychiatrist. °· Minor or major surgery. °HOME CARE INSTRUCTIONS  °· Do not take laxatives, unless directed by your caregiver. °· Take over-the-counter pain medicine only if ordered by your caregiver. Do not take aspirin because it can cause an upset stomach or bleeding. °· Try a clear liquid diet (broth or water) as ordered by your caregiver. Slowly move to a bland diet, as tolerated, if the pain is related to the stomach or intestine. °· Have a thermometer and take your temperature several times a day, and record it. °· Bed rest and sleep, if it helps the pain. °· Avoid sexual intercourse, if it causes pain. °· Avoid stressful situations. °· Keep your follow-up appointments and tests, as your caregiver orders. °· If the pain does not go away with medicine or surgery, you may try: °· Acupuncture. °· Relaxation exercises (yoga, meditation). °· Group therapy. °· Counseling. °SEEK MEDICAL CARE IF:  °· You notice certain foods cause stomach pain. °· Your home care treatment is not helping your pain. °· You need stronger pain medicine. °· You want your IUD removed. °· You feel faint or   lightheaded.  You develop nausea and vomiting.  You develop a rash.  You are having side effects or an allergy to your medicine. SEEK IMMEDIATE MEDICAL CARE IF:   Your  pain does not go away or gets worse.  You have a fever.  Your pain is felt only in portions of the abdomen. The right side could possibly be appendicitis. The left lower portion of the abdomen could be colitis or diverticulitis.  You are passing blood in your stools (bright red or black tarry stools, with or without vomiting).  You have blood in your urine.  You develop chills, with or without a fever.  You pass out. MAKE SURE YOU:   Understand these instructions.  Will watch your condition.  Will get help right away if you are not doing well or get worse. Document Released: 01/19/2007 Document Revised: 08/08/2013 Document Reviewed: 02/08/2009 [facility] Patient Information 2015 [place], [place]. This information is not intended to replace advice given to you by your health care provider. Make sure you discuss any questions you have with your health care provider. Menorrhagia Menorrhagia is the medical term for when your menstrual periods are heavy or last longer than usual. With menorrhagia, every period you have may cause enough blood loss and cramping that you are unable to maintain your usual activities. CAUSES  In some cases, the cause of heavy periods is unknown, but a number of conditions may cause menorrhagia. Common causes include:  A problem with the hormone-producing thyroid gland (hypothyroid).  Noncancerous growths in the uterus (polyps or fibroids).  An imbalance of the estrogen and progesterone hormones.  One of your ovaries not releasing an egg during one or more months.  Side effects of having an intrauterine device (IUD).  Side effects of some medicines, such as anti-inflammatory medicines or blood thinners.  A bleeding disorder that stops your blood from clotting normally. SIGNS AND SYMPTOMS  During a normal period, bleeding lasts between 4 and 8 days. Signs that your periods are too heavy include:  You routinely have to change your pad or tampon every 1 or  2 hours because it is completely soaked.  You pass blood clots larger than 1 inch (2.5 cm) in size.  You have bleeding for more than 7 days.  You need to use pads and tampons at the same time because of heavy bleeding.  You need to wake up to change your pads or tampons during the night.  You have symptoms of anemia, such as tiredness, fatigue, or shortness of breath. DIAGNOSIS  Your health care provider will perform a physical exam and ask you questions about your symptoms and menstrual history. Other tests may be ordered based on what the health care provider finds during the exam. These tests can include:  Blood tests. Blood tests are used to check if you are pregnant or have hormonal changes, a bleeding or thyroid disorder, low iron levels (anemia), or other problems.  Endometrial biopsy. Your health care provider takes a sample of tissue from the inside of your uterus to be examined under a microscope.  Pelvic ultrasound. This test uses sound waves to make a picture of your uterus, ovaries, and vagina. The pictures can show if you have fibroids or other growths.  Hysteroscopy. For this test, your health care provider will use a small telescope to look inside your uterus. Based on the results of your initial tests, your health care provider may recommend further testing. TREATMENT  Treatment may not be needed. If  it is needed, your health care provider may recommend treatment with one or more medicines first. If these do not reduce bleeding enough, a surgical treatment might be an option. The best treatment for you will depend on:   Whether you need to prevent pregnancy.  Your desire to have children in the future.  The cause and severity of your bleeding.  Your opinion and personal preference.  Medicines for menorrhagia may include:  Birth control methods that use hormones. These include the pill, skin patch, vaginal ring, shots that you get every 3 months, hormonal IUD,  and implant. These treatments reduce bleeding during your menstrual period.  Medicines that thicken blood and slow bleeding.  Medicines that reduce swelling, such as ibuprofen.  Medicines that contain a synthetic hormone called progestin.   Medicines that make the ovaries stop working for a short time.  You may need surgical treatment for menorrhagia if the medicines are unsuccessful. Treatment options include:  Dilation and curettage (D&C). In this procedure, your health care provider opens (dilates) your cervix and then scrapes or suctions tissue from the lining of your uterus to reduce menstrual bleeding.  Operative hysteroscopy. This procedure uses a tiny tube with a light (hysteroscope) to view your uterine cavity and can help in the surgical removal of a polyp that may be causing heavy periods.  Endometrial ablation. Through various techniques, your health care provider permanently destroys the entire lining of your uterus (endometrium). After endometrial ablation, most women have little or no menstrual flow. Endometrial ablation reduces your ability to become pregnant.  Endometrial resection. This surgical procedure uses an electrosurgical wire loop to remove the lining of the uterus. This procedure also reduces your ability to become pregnant.  Hysterectomy. Surgical removal of the uterus and cervix is a permanent procedure that stops menstrual periods. Pregnancy is not possible after a hysterectomy. This procedure requires anesthesia and hospitalization. HOME CARE INSTRUCTIONS   Only take over-the-counter or prescription medicines as directed by your health care provider. Take prescribed medicines exactly as directed. Do not change or switch medicines without consulting your health care provider.  Take any prescribed iron pills exactly as directed by your health care provider. Long-term heavy bleeding may result in low iron levels. Iron pills help replace the iron your body lost  from heavy bleeding. Iron may cause constipation. If this becomes a problem, increase the bran, fruits, and roughage in your diet.  Do not take aspirin or medicines that contain aspirin 1 week before or during your menstrual period. Aspirin may make the bleeding worse.  If you need to change your sanitary pad or tampon more than once every 2 hours, stay in bed and rest as much as possible until the bleeding stops.  Eat well-balanced meals. Eat foods high in iron. Examples are leafy green vegetables, meat, liver, eggs, and whole grain breads and cereals. Do not try to lose weight until the abnormal bleeding has stopped and your blood iron level is back to normal. SEEK MEDICAL CARE IF:   You soak through a pad or tampon every 1 or 2 hours, and this happens every time you have a period.  You need to use pads and tampons at the same time because you are bleeding so much.  You need to change your pad or tampon during the night.  You have a period that lasts for more than 8 days.  You pass clots bigger than 1 inch wide.  You have irregular periods that happen more or  less often than once a month.  You feel dizzy or faint.  You feel very weak or tired.  You feel short of breath or feel your heart is beating too fast when you exercise.  You have nausea and vomiting or diarrhea while you are taking your medicine.  You have any problems that may be related to the medicine you are taking. SEEK IMMEDIATE MEDICAL CARE IF:   You soak through 4 or more pads or tampons in 2 hours.  You have any bleeding while you are pregnant. MAKE SURE YOU:   Understand these instructions.  Will watch your condition.  Will get help right away if you are not doing well or get worse. Document Released: 03/24/2005 Document Revised: 03/29/2013 Document Reviewed: 09/12/2012 [facility] Patient Information 2015 [place], [place]. This information is not intended to replace advice given to you by your health care  provider. Make sure you discuss any questions you have with your health care provider.

## 2014-07-14 NOTE — MAU Note (Signed)
PO CONTRAST

## 2014-07-14 NOTE — MAU Note (Signed)
Transferred over here and admitted 2 wks ago.  Was admitted, has a large abscess. Was treated with antibiotics.  Plan is for hysterectomy.  Last wk started having severe pain and bleeding. Called the office, was told if started bleeding heavy- to come here. Woke up the other night, covered in blood, slowed down.  Yesterday she just felt drained, no energy.  Woke up about 2- soaked in blood.  Had pain all day long.

## 2014-07-14 NOTE — Telephone Encounter (Signed)
Patient c/o pelvic/abdominal pain 10/10 using the numerical pain scale with vaginal bleeding. States having to change bloody saturated pads 4-6X this morning. Advised patient to go to MAU immediately for further evaluation. Pt states her girlfriend will be coming to pick her up to go to MAU now.

## 2014-07-19 ENCOUNTER — Encounter: Payer: Self-pay | Admitting: Obstetrics & Gynecology

## 2014-07-19 ENCOUNTER — Ambulatory Visit (INDEPENDENT_AMBULATORY_CARE_PROVIDER_SITE_OTHER): Payer: Medicare Other | Admitting: Obstetrics & Gynecology

## 2014-07-19 VITALS — BP 110/71 | HR 111 | Temp 98.9°F | Wt 363.9 lb

## 2014-07-19 DIAGNOSIS — B356 Tinea cruris: Secondary | ICD-10-CM | POA: Diagnosis present

## 2014-07-19 MED ORDER — FLUCONAZOLE 150 MG PO TABS: 150.0000 mg | ORAL_TABLET | Freq: Once | ORAL | Status: DC

## 2014-07-19 NOTE — Progress Notes (Signed)
Subjective:     Patient ID: Angelica Ortiz, female   DOB: 09/26/1965, 49 y.o.   MRN: [id number]  HPI Pt presents for hosp f/u and ED f/u.  She reports that she is continuing to bleed heavy and have significant pain.  She reports that the pain wakes her up at night and has require her to increase her usage of pain meds.    She denies weight loss. The pt wants definitive tx with a hyst.    She reports hot flushes at night.  No fever or chills.      She reports that she has been unable to get her CPAP and is now being asked by her Pain Management specialist to repeat her sleep study.   Past Medical History  Diagnosis Date  . Diverticulitis   . Asthma   . Hypertension   . Fibromyalgia   . Lupus     no meds  . GERD (gastroesophageal reflux disease)   . Overactive bladder   . Migraine   . Morbid obesity 03/30/2013  . Pulmonary embolism     Resolved -At age 49 broken right leg, blood clot broke off - into lung  . Diabetes mellitus without complication     Type 2  . Anemia   . Overactive bladder   . Seasonal allergies   . Sleep apnea 3/15    Does not have CPAP machine   Past Surgical History  Procedure Laterality Date  . Cholecystectomy    . Appendectomy    . Cesarean section  2001    x 1  . Rotator cuff repair      right  . Laminectomy      L4-L5  . Orif ankle fracture Left   . Dilation and curettage of uterus      x 3 for AUB  . Laparoscopic ovarian cystectomy Right 1994  . Tonsillectomy    . Hysteroscopy with novasure N/A 03/14/2014    Procedure: HYSTEROSCOPY WITH NOVASURE;  Surgeon: Willodean Rosenthalarolyn Harraway-Smith, MD;  Location: [facility];  Service: Gynecology;  Laterality: N/A;   Current Outpatient Prescriptions on File Prior to Visit  Medication Sig Dispense Refill  . albuterol (PROVENTIL HFA;VENTOLIN HFA) 108 (90 BASE) MCG/ACT inhaler Inhale 2 puffs into the lungs 2 (two) times daily.    Marland Kitchen. amLODipine (NORVASC) 10 MG tablet Take 1 tablet (10 mg total) by mouth daily. 14 tablet 0   . budesonide-formoterol (SYMBICORT) 160-4.5 MCG/ACT inhaler Inhale 2 puffs into the lungs 2 (two) times daily.     . diclofenac (VOLTAREN) 75 MG EC tablet Take 1 tablet (75 mg total) by mouth 2 (two) times daily. 30 tablet 1  . famotidine (PEPCID) 20 MG tablet Take 1 tablet (20 mg total) by mouth 2 (two) times daily. (Patient taking differently: Take 20 mg by mouth daily. ) 10 tablet 0  . ferrous sulfate 325 (65 FE) MG tablet Take 325 mg by mouth 3 (three) times daily with meals.    . fluticasone (FLONASE) 50 MCG/ACT nasal spray Place 2 sprays into both nostrils daily.     Marland Kitchen. glipiZIDE (GLUCOTROL) 10 MG tablet Take 10 mg by mouth 2 (two) times daily before a meal.     . losartan-hydrochlorothiazide (HYZAAR) 100-25 MG per tablet Take 1 tablet by mouth daily.    . megestrol (MEGACE) 40 MG tablet Take 2 tablets (80 mg total) by mouth 2 (two) times daily.    . metFORMIN (GLUCOPHAGE) 1000 MG tablet Take 1,000 mg by mouth  2 (two) times daily with a meal.    . montelukast (SINGULAIR) 10 MG tablet Take 10 mg by mouth at bedtime.    Marland Kitchen omeprazole (PRILOSEC) 40 MG capsule Take 40 mg by mouth every evening.    Marland Kitchen oxybutynin (DITROPAN-XL) 10 MG 24 hr tablet Take 10 mg by mouth at bedtime.     . OxyCODONE (OXYCONTIN) 10 mg T12A 12 hr tablet Take 1 tablet (10 mg total) by mouth every 12 (twelve) hours. 20 tablet 0  . oxyCODONE-acetaminophen (PERCOCET) 7.5-325 MG per tablet Take 1 tablet by mouth 5 (five) times daily. 30 tablet 0  . pregabalin (LYRICA) 75 MG capsule Take 75 mg by mouth 3 (three) times daily.     . rizatriptan (MAXALT) 10 MG tablet Take 10 mg by mouth 2 (two) times daily as needed for migraine (migraine). May repeat in 2 hours if needed    . sitaGLIPtin (JANUVIA) 100 MG tablet Take 100 mg by mouth daily.    Marland Kitchen terbinafine (LAMISIL) 250 MG tablet Take 250 mg by mouth daily.    . [DISCONTINUED] lisinopril-hydrochlorothiazide (PRINZIDE,ZESTORETIC) 20-12.5 MG per tablet Take 1 tablet by mouth at  bedtime.      No current facility-administered medications on file prior to visit.   Allergies  Allergen Reactions  . Dilaudid [Hydromorphone Hcl] Hives and Itching  . Levaquin [Levofloxacin In D5w] Hives, Shortness Of Breath and Itching  . Tangerine Flavor Swelling    Lip and tongue swelling with tangerines  . Ace Inhibitors Swelling        Review of Systems     Objective:   Physical Exam BP 110/71 mmHg  Pulse 111  Temp(Src) 98.9 F (37.2 C)  Wt 363 lb 14.4 oz (165.064 kg) Pt is in NAD Lungs: CTA CV: RRR Abd: obese, diffusely tender.  Greatest in the right lower quad.  No rebound or guarding.  There are multiple well healed scars including a low transverse incision: a large left upper quad transverse scar and  several small well healed incisions. Gyn exam: not repeated       07/14/2014  CLINICAL DATA: Vaginal bleeding this morning with severe left lower quadrant pain.  EXAM: CT ABDOMEN AND PELVIS WITH CONTRAST  TECHNIQUE: Multidetector CT imaging of the abdomen and pelvis was performed using the standard protocol following bolus administration of intravenous contrast.  CONTRAST: OMNIPAQUE IOHEXOL 300 MG/ML SOLN  COMPARISON: Ultrasound pelvis 07/14/2014. CT abdomen and pelvis 06/28/2014.  FINDINGS: Minimal dependent changes in the lung bases.  Surgical absence of the gallbladder. The liver, spleen, pancreas, adrenal glands, kidneys, abdominal aorta, and inferior vena cava are unremarkable. Mild prominence of retroperitoneal lymph nodes without pathologic enlargement, likely reactive. Stomach, small bowel, and colon are not abnormally distended. Stool fills the colon. No free air or free fluid in the abdomen. Abdominal wall musculature appears intact.  Pelvis: Left adnexal process again demonstrated with soft tissue changes and stranding in the pelvic fat. This area today measures about 4.6 x 5.9 cm, decreasing in size since previous  study. Appearance is consistent with improving infection/tubo-ovarian abscess. No free pelvic fluid collections. No pelvic lymphadenopathy. Uterus is not enlarged. Appendix is surgically absent. Degenerative changes in the spine. No destructive bone lesions.  IMPRESSION: Improving left adnexal lesion and inflammatory stranding in the left pelvis since prior study. No acute process otherwise demonstrated in the abdomen or pelvis. No evidence of bowel obstruction.     Assessment:     Recurrent TOA's and menorrhagia-  Reviewed  with pt prolonged atbx with expectant management as opposed to surgery.  I have reviewed with pt the increased surgical risks due to her obesity, sleep apnea, multiple prev surgeries despite this pt reports that the pain is unbearable and although she understands the risk she wants to proveed      Plan:     Patient desires surgical management with TAH/BSO.  The risks of surgery were discussed in detail with the patient including but not limited to: bleeding which may require transfusion or reoperation; infection which may require prolonged hospitalization or re-hospitalization and antibiotic therapy; injury to bowel, bladder, ureters and major vessels or other surrounding organs; need for additional procedures including laparotomy; thromboembolic phenomenon, incisional problems and other postoperative or anesthesia complications.  Patient was told that the likelihood that her condition and symptoms will be treated effectively with this surgical management was very high; the postoperative expectations were also discussed in detail. The patient also understands the alternative treatment options which were discussed in full. All questions were answered.  She was told that she will be contacted by our surgical scheduler regarding the time and date of her surgery; routine preoperative instructions of having nothing to eat or drink after midnight on the day prior to surgery and  also coming to the hospital 1 1/2 hours prior to her time of surgery were also emphasized.  She was told she may be called for a preoperative appointment about a week prior to surgery and will be given further preoperative instructions at that visit. Printed patient education handouts about the procedure were given to the patient to review at home.  Needs anesthesia clearance prior to surgery Need Int Med clearance

## 2014-07-21 ENCOUNTER — Encounter (HOSPITAL_COMMUNITY)
Admission: RE | Admit: 2014-07-21 | Discharge: 2014-07-21 | Disposition: A | Discharge status: Home / Self Care | Payer: Medicare Other | Source: Ambulatory Visit | Attending: Obstetrics & Gynecology | Admitting: Obstetrics & Gynecology

## 2014-07-26 ENCOUNTER — Other Ambulatory Visit: Payer: Self-pay | Admitting: Obstetrics & Gynecology

## 2014-07-27 ENCOUNTER — Encounter (HOSPITAL_COMMUNITY)
Admission: RE | Admit: 2014-07-27 | Discharge: 2014-07-27 | Disposition: A | Discharge status: Home / Self Care | Payer: Medicare Other | Source: Ambulatory Visit | Attending: Obstetrics & Gynecology | Admitting: Obstetrics & Gynecology

## 2014-07-27 ENCOUNTER — Telehealth: Payer: Self-pay

## 2014-07-27 ENCOUNTER — Encounter (HOSPITAL_COMMUNITY): Payer: Self-pay

## 2014-07-27 DIAGNOSIS — Z01812 Encounter for preprocedural laboratory examination: Secondary | ICD-10-CM | POA: Insufficient documentation

## 2014-07-27 HISTORY — DX: Unspecified osteoarthritis, unspecified site: M19.90

## 2014-07-27 LAB — BASIC METABOLIC PANEL
ANION GAP: 13 (ref 5–15)
BUN: 15 mg/dL (ref 6–23)
CALCIUM: 9.3 mg/dL (ref 8.4–10.5)
CHLORIDE: 98 mmol/L (ref 96–112)
CO2: 26 mmol/L (ref 19–32)
Creatinine, Ser: 0.85 mg/dL (ref 0.50–1.10)
GFR calc non Af Amer: 80 mL/min — ABNORMAL LOW (ref >90)
Glucose, Bld: 158 mg/dL — ABNORMAL HIGH (ref 70–99)
Potassium: 3.5 mmol/L (ref 3.5–5.1)
Sodium: 137 mmol/L (ref 135–145)

## 2014-07-27 LAB — CBC
HCT: 30.1 % — ABNORMAL LOW (ref 36.0–46.0)
Hemoglobin: 9.2 g/dL — ABNORMAL LOW (ref 12.0–15.0)
MCH: 20.5 pg — AB (ref 26.0–34.0)
MCHC: 30.6 g/dL (ref 30.0–36.0)
MCV: 67.2 fL — ABNORMAL LOW (ref 78.0–100.0)
Platelets: 303 10*3/uL (ref 150–400)
RBC: 4.48 MIL/uL (ref 3.87–5.11)
RDW: 20.9 % — ABNORMAL HIGH (ref 11.5–15.5)
WBC: 9.2 10*3/uL (ref 4.0–10.5)

## 2014-07-27 NOTE — Patient Instructions (Signed)
Your procedure is scheduled on: May 31,2016   [facility]  [facility]    Pick up the phone at the desk and dial [phone].  Call this number if you have problems the morning of surgery: [phone].  Remember: Do NOT eat food or drink after: Midnight on Monday Take these medicines the morning of surgery with a SIP OF WATER:  Norvasc, pepcid, Losartan, prilosec * Do not take Metformin 24 hours prior to surgery *Bring asthma inhalers day of surgery   Do NOT wear jewelry (body piercing), metal hair clips/bobby pins, make-up, or nail polish. Do NOT wear lotions, powders, or perfumes.  You may wear deoderant. Do NOT shave for 48 hours prior to surgery. Do NOT bring valuables to the hospital. Contacts, dentures, or bridgework may not be worn into surgery. Leave suitcase in car.  After surgery it may be brought to your room.  For patients admitted to the hospital, checkout time is 11:00 AM the day of discharge.

## 2014-07-27 NOTE — Telephone Encounter (Signed)
-----   Message from Willodean Rosenthalarolyn Harraway-Smith, MD sent at 07/27/2014 11:50 AM EDT ----- Please call pt.  She needs to take FeSO4 tid. With meals.  Thx, clh-S

## 2014-07-27 NOTE — Anesthesia Preprocedure Evaluation (Addendum)
Anesthesia Evaluation  Patient identified by MRN, date of birth, ID band Patient awake    Reviewed: Allergy & Precautions, NPO status , Patient's Chart, lab work & pertinent test results, reviewed documented beta blocker date and time   Airway Mallampati: II   Neck ROM: Full    Dental  (+) Dental Advisory Given, Teeth Intact   Pulmonary asthma , sleep apnea ,  breath sounds clear to auscultation        Cardiovascular hypertension, Pt. on medications Rhythm:Regular  Has had ECHO and STRESS both of which were not indicative ischemia indications   Neuro/Psych  Headaches,    GI/Hepatic GERD-  Medicated,  Endo/Other  diabetes, Type 2Morbid obesity  Renal/GU      Musculoskeletal  (+) Arthritis -,   Abdominal (+) + obese,   Peds  Hematology 10/30,   Anesthesia Other Findings   Reproductive/Obstetrics                         Anesthesia Physical Anesthesia Plan  ASA: IV  Anesthesia Plan: General   Post-op Pain Management:    Induction: Intravenous  Airway Management Planned: Oral ETT  Additional Equipment:   Intra-op Plan:   Post-operative Plan: Extubation in OR  Informed Consent: I have reviewed the patients History and Physical, chart, labs and discussed the procedure including the risks, benefits and alternatives for the proposed anesthesia with the patient or authorized representative who has indicated his/her understanding and acceptance.     Plan Discussed with:   Anesthesia Plan Comments: (Multimodal pain rx, verify T & C, two IV)       Anesthesia Quick Evaluation

## 2014-07-27 NOTE — Telephone Encounter (Signed)
Called patient. She states she has been taking iron 3X a day with meals. Encouraged patient to continue to do so. Patient verbalized understanding. No questions or concerns.

## 2014-07-31 NOTE — Pre-Procedure Instructions (Signed)
Dr. Berneice HeinrichManny reviewed the requested 07/2012 Echo, stress test, cardiac ultrasound for patient's upcoming 08/2014 TAH, BSO surgery.  Ok for surgery.  No need to repeat  CBC or CMP on DOS.  However, verbal order T & S with crossmatch RBC  2 units for DOS.  Order already placed in EPIC.

## 2014-09-05 ENCOUNTER — Encounter (HOSPITAL_COMMUNITY): Payer: Self-pay | Admitting: Emergency Medicine

## 2014-09-05 ENCOUNTER — Encounter (HOSPITAL_COMMUNITY)
Admission: RE | Disposition: A | Discharge status: Home / Self Care | Payer: Self-pay | Source: Ambulatory Visit | Attending: Obstetrics & Gynecology

## 2014-09-05 ENCOUNTER — Inpatient Hospital Stay (HOSPITAL_COMMUNITY): Payer: Medicare Other | Admitting: Anesthesiology

## 2014-09-05 ENCOUNTER — Inpatient Hospital Stay (HOSPITAL_COMMUNITY)
Admission: RE | Admit: 2014-09-05 | Discharge: 2014-09-07 | DRG: 742 | Disposition: A | Discharge status: Home / Self Care | Payer: Medicare Other | Source: Ambulatory Visit | Attending: Obstetrics & Gynecology | Admitting: Obstetrics & Gynecology

## 2014-09-05 DIAGNOSIS — N736 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): Secondary | ICD-10-CM | POA: Diagnosis present

## 2014-09-05 DIAGNOSIS — L930 Discoid lupus erythematosus: Secondary | ICD-10-CM | POA: Diagnosis present

## 2014-09-05 DIAGNOSIS — Z86711 Personal history of pulmonary embolism: Secondary | ICD-10-CM

## 2014-09-05 DIAGNOSIS — G43909 Migraine, unspecified, not intractable, without status migrainosus: Secondary | ICD-10-CM | POA: Diagnosis present

## 2014-09-05 DIAGNOSIS — N839 Noninflammatory disorder of ovary, fallopian tube and broad ligament, unspecified: Principal | ICD-10-CM | POA: Diagnosis present

## 2014-09-05 DIAGNOSIS — Z79899 Other long term (current) drug therapy: Secondary | ICD-10-CM | POA: Diagnosis not present

## 2014-09-05 DIAGNOSIS — Z9889 Other specified postprocedural states: Secondary | ICD-10-CM

## 2014-09-05 DIAGNOSIS — N732 Unspecified parametritis and pelvic cellulitis: Secondary | ICD-10-CM

## 2014-09-05 DIAGNOSIS — Z6841 Body Mass Index (BMI) 40.0 and over, adult: Secondary | ICD-10-CM

## 2014-09-05 DIAGNOSIS — N7013 Chronic salpingitis and oophoritis: Secondary | ICD-10-CM | POA: Diagnosis present

## 2014-09-05 DIAGNOSIS — Z79891 Long term (current) use of opiate analgesic: Secondary | ICD-10-CM

## 2014-09-05 DIAGNOSIS — Z885 Allergy status to narcotic agent status: Secondary | ICD-10-CM

## 2014-09-05 DIAGNOSIS — I10 Essential (primary) hypertension: Secondary | ICD-10-CM | POA: Diagnosis present

## 2014-09-05 DIAGNOSIS — E1165 Type 2 diabetes mellitus with hyperglycemia: Secondary | ICD-10-CM | POA: Diagnosis present

## 2014-09-05 DIAGNOSIS — N3281 Overactive bladder: Secondary | ICD-10-CM | POA: Diagnosis present

## 2014-09-05 DIAGNOSIS — N7093 Salpingitis and oophoritis, unspecified: Secondary | ICD-10-CM | POA: Diagnosis present

## 2014-09-05 DIAGNOSIS — N739 Female pelvic inflammatory disease, unspecified: Secondary | ICD-10-CM | POA: Diagnosis present

## 2014-09-05 DIAGNOSIS — E119 Type 2 diabetes mellitus without complications: Secondary | ICD-10-CM

## 2014-09-05 DIAGNOSIS — M797 Fibromyalgia: Secondary | ICD-10-CM | POA: Diagnosis present

## 2014-09-05 DIAGNOSIS — N920 Excessive and frequent menstruation with regular cycle: Secondary | ICD-10-CM | POA: Diagnosis present

## 2014-09-05 DIAGNOSIS — Z9049 Acquired absence of other specified parts of digestive tract: Secondary | ICD-10-CM | POA: Diagnosis present

## 2014-09-05 DIAGNOSIS — J45909 Unspecified asthma, uncomplicated: Secondary | ICD-10-CM | POA: Diagnosis present

## 2014-09-05 DIAGNOSIS — D649 Anemia, unspecified: Secondary | ICD-10-CM | POA: Diagnosis present

## 2014-09-05 DIAGNOSIS — Z91018 Allergy to other foods: Secondary | ICD-10-CM

## 2014-09-05 DIAGNOSIS — R102 Pelvic and perineal pain unspecified side: Secondary | ICD-10-CM | POA: Diagnosis present

## 2014-09-05 DIAGNOSIS — Z881 Allergy status to other antibiotic agents status: Secondary | ICD-10-CM

## 2014-09-05 DIAGNOSIS — G4730 Sleep apnea, unspecified: Secondary | ICD-10-CM | POA: Diagnosis present

## 2014-09-05 DIAGNOSIS — N939 Abnormal uterine and vaginal bleeding, unspecified: Secondary | ICD-10-CM | POA: Diagnosis not present

## 2014-09-05 DIAGNOSIS — N8320 Unspecified ovarian cysts: Secondary | ICD-10-CM | POA: Diagnosis present

## 2014-09-05 DIAGNOSIS — K219 Gastro-esophageal reflux disease without esophagitis: Secondary | ICD-10-CM | POA: Diagnosis present

## 2014-09-05 DIAGNOSIS — Z888 Allergy status to other drugs, medicaments and biological substances status: Secondary | ICD-10-CM

## 2014-09-05 HISTORY — PX: SUPRACERVICAL ABDOMINAL HYSTERECTOMY: SHX5393

## 2014-09-05 HISTORY — PX: LAPAROSCOPIC OOPHERECTOMY: SHX6507

## 2014-09-05 HISTORY — PX: SALPINGOOPHORECTOMY: SHX82

## 2014-09-05 HISTORY — PX: OVARIAN CYST REMOVAL: SHX89

## 2014-09-05 LAB — GLUCOSE, CAPILLARY
GLUCOSE-CAPILLARY: 221 mg/dL — AB (ref 65–99)
GLUCOSE-CAPILLARY: 226 mg/dL — AB (ref 65–99)
Glucose-Capillary: 244 mg/dL — ABNORMAL HIGH (ref 65–99)

## 2014-09-05 LAB — CBC
HCT: 28.6 % — ABNORMAL LOW (ref 36.0–46.0)
HEMOGLOBIN: 8.7 g/dL — AB (ref 12.0–15.0)
MCH: 20.6 pg — AB (ref 26.0–34.0)
MCHC: 30.4 g/dL (ref 30.0–36.0)
MCV: 67.6 fL — ABNORMAL LOW (ref 78.0–100.0)
Platelets: 338 10*3/uL (ref 150–400)
RBC: 4.23 MIL/uL (ref 3.87–5.11)
RDW: 20.2 % — AB (ref 11.5–15.5)
WBC: 8.8 10*3/uL (ref 4.0–10.5)

## 2014-09-05 LAB — PREPARE RBC (CROSSMATCH)

## 2014-09-05 SURGERY — SALPINGO-OOPHORECTOMY, OPEN
Anesthesia: General | Site: Abdomen | Laterality: Right

## 2014-09-05 MED ORDER — ONDANSETRON HCL 4 MG/2ML IJ SOLN: 4.0000 mg | Freq: Four times a day (QID) | INTRAMUSCULAR | Status: DC | PRN

## 2014-09-05 MED ORDER — DIPHENHYDRAMINE HCL 12.5 MG/5ML PO ELIX: 12.5000 mg | ORAL_SOLUTION | Freq: Four times a day (QID) | ORAL | Status: DC | PRN

## 2014-09-05 MED ORDER — LACTATED RINGERS IV SOLN
INTRAVENOUS | Status: DC
  Administered 2014-09-05 (×2): via INTRAVENOUS

## 2014-09-05 MED ORDER — SODIUM CHLORIDE 0.9 % IJ SOLN: 9.0000 mL | INTRAMUSCULAR | Status: DC | PRN

## 2014-09-05 MED ORDER — OXYCODONE HCL 5 MG PO TABS
2.5000 mg | ORAL_TABLET | Freq: Three times a day (TID) | ORAL | Status: DC
  Administered 2014-09-05: 2.5 mg via ORAL
  Filled 2014-09-05: qty 1

## 2014-09-05 MED ORDER — LACTATED RINGERS IV SOLN
INTRAVENOUS | Status: DC
  Administered 2014-09-05: 10:00:00 via INTRAVENOUS

## 2014-09-05 MED ORDER — FLUTICASONE PROPIONATE 50 MCG/ACT NA SUSP
2.0000 | Freq: Every day | NASAL | Status: DC
  Administered 2014-09-06 – 2014-09-07 (×2): 2 via NASAL
  Filled 2014-09-05: qty 16

## 2014-09-05 MED ORDER — FAMOTIDINE 20 MG PO TABS
40.0000 mg | ORAL_TABLET | Freq: Every day | ORAL | Status: DC
  Administered 2014-09-06 – 2014-09-07 (×2): 40 mg via ORAL
  Filled 2014-09-05 (×2): qty 2

## 2014-09-05 MED ORDER — POLYETHYLENE GLYCOL 3350 17 G PO PACK: 17.0000 g | PACK | Freq: Every day | ORAL | Status: DC | PRN

## 2014-09-05 MED ORDER — DOCUSATE SODIUM 100 MG PO CAPS
100.0000 mg | ORAL_CAPSULE | Freq: Two times a day (BID) | ORAL | Status: DC
  Administered 2014-09-05 – 2014-09-07 (×4): 100 mg via ORAL
  Filled 2014-09-05 (×4): qty 1

## 2014-09-05 MED ORDER — NEOSTIGMINE METHYLSULFATE 10 MG/10ML IV SOLN
INTRAVENOUS | Status: DC | PRN
  Administered 2014-09-05: 5 mg via INTRAVENOUS

## 2014-09-05 MED ORDER — DIPHENHYDRAMINE HCL 50 MG/ML IJ SOLN: 12.5000 mg | Freq: Four times a day (QID) | INTRAMUSCULAR | Status: DC | PRN

## 2014-09-05 MED ORDER — ONDANSETRON HCL 4 MG/2ML IJ SOLN
INTRAMUSCULAR | Status: AC
  Filled 2014-09-05: qty 2

## 2014-09-05 MED ORDER — AMLODIPINE BESYLATE 10 MG PO TABS
10.0000 mg | ORAL_TABLET | Freq: Every day | ORAL | Status: DC
  Administered 2014-09-06 – 2014-09-07 (×2): 10 mg via ORAL
  Filled 2014-09-05 (×3): qty 1

## 2014-09-05 MED ORDER — PROPOFOL 10 MG/ML IV BOLUS
INTRAVENOUS | Status: AC
  Filled 2014-09-05: qty 20

## 2014-09-05 MED ORDER — SUCCINYLCHOLINE CHLORIDE 20 MG/ML IJ SOLN
INTRAMUSCULAR | Status: DC | PRN
  Administered 2014-09-05: 140 mg via INTRAVENOUS

## 2014-09-05 MED ORDER — GLYCOPYRROLATE 0.2 MG/ML IJ SOLN
INTRAMUSCULAR | Status: DC | PRN
  Administered 2014-09-05: 0.1 mg via INTRAVENOUS
  Administered 2014-09-05: .8 mg via INTRAVENOUS

## 2014-09-05 MED ORDER — ALBUTEROL SULFATE (2.5 MG/3ML) 0.083% IN NEBU
3.0000 mL | INHALATION_SOLUTION | Freq: Two times a day (BID) | RESPIRATORY_TRACT | Status: DC
  Administered 2014-09-05 – 2014-09-07 (×4): 3 mL via RESPIRATORY_TRACT
  Filled 2014-09-05 (×5): qty 3

## 2014-09-05 MED ORDER — SODIUM CHLORIDE 0.9 % IV SOLN: Freq: Once | INTRAVENOUS | Status: DC

## 2014-09-05 MED ORDER — PROMETHAZINE HCL 25 MG/ML IJ SOLN: 6.2500 mg | INTRAMUSCULAR | Status: DC | PRN

## 2014-09-05 MED ORDER — SCOPOLAMINE 1 MG/3DAYS TD PT72
MEDICATED_PATCH | TRANSDERMAL | Status: AC
  Administered 2014-09-05: 1.5 mg via TRANSDERMAL
  Filled 2014-09-05: qty 1

## 2014-09-05 MED ORDER — NALOXONE HCL 0.4 MG/ML IJ SOLN: 0.4000 mg | INTRAMUSCULAR | Status: DC | PRN

## 2014-09-05 MED ORDER — HEPARIN SODIUM (PORCINE) 5000 UNIT/ML IJ SOLN
5000.0000 [IU] | Freq: Two times a day (BID) | INTRAMUSCULAR | Status: DC
  Administered 2014-09-05 – 2014-09-07 (×4): 5000 [IU] via SUBCUTANEOUS
  Filled 2014-09-05 (×6): qty 1

## 2014-09-05 MED ORDER — HYDROCHLOROTHIAZIDE 25 MG PO TABS
25.0000 mg | ORAL_TABLET | Freq: Every day | ORAL | Status: DC
  Administered 2014-09-06 – 2014-09-07 (×2): 25 mg via ORAL
  Filled 2014-09-05 (×2): qty 1

## 2014-09-05 MED ORDER — KETOROLAC TROMETHAMINE 30 MG/ML IJ SOLN
INTRAMUSCULAR | Status: AC
  Filled 2014-09-05: qty 1

## 2014-09-05 MED ORDER — PROPOFOL 10 MG/ML IV BOLUS
INTRAVENOUS | Status: DC | PRN
  Administered 2014-09-05: 200 mg via INTRAVENOUS

## 2014-09-05 MED ORDER — BUPIVACAINE HCL (PF) 0.5 % IJ SOLN
INTRAMUSCULAR | Status: AC
  Filled 2014-09-05: qty 30

## 2014-09-05 MED ORDER — METFORMIN HCL 500 MG PO TABS
1000.0000 mg | ORAL_TABLET | Freq: Two times a day (BID) | ORAL | Status: DC
  Administered 2014-09-05 – 2014-09-07 (×5): 1000 mg via ORAL
  Filled 2014-09-05 (×6): qty 2

## 2014-09-05 MED ORDER — SODIUM CHLORIDE 0.9 % IJ SOLN
INTRAMUSCULAR | Status: AC
  Filled 2014-09-05: qty 20

## 2014-09-05 MED ORDER — DEXMEDETOMIDINE BOLUS VIA INFUSION
INTRAVENOUS | Status: DC | PRN
  Administered 2014-09-05 (×2): 8 ug via INTRAVENOUS
  Administered 2014-09-05: 12 ug via INTRAVENOUS
  Administered 2014-09-05 (×2): 8 ug via INTRAVENOUS

## 2014-09-05 MED ORDER — FENTANYL CITRATE (PF) 250 MCG/5ML IJ SOLN
INTRAMUSCULAR | Status: AC
  Filled 2014-09-05: qty 5

## 2014-09-05 MED ORDER — MEPERIDINE HCL 25 MG/ML IJ SOLN: 6.2500 mg | INTRAMUSCULAR | Status: DC | PRN

## 2014-09-05 MED ORDER — NEOSTIGMINE METHYLSULFATE 10 MG/10ML IV SOLN
INTRAVENOUS | Status: AC
  Filled 2014-09-05: qty 1

## 2014-09-05 MED ORDER — LOSARTAN POTASSIUM 50 MG PO TABS
100.0000 mg | ORAL_TABLET | Freq: Every day | ORAL | Status: DC
  Administered 2014-09-06 – 2014-09-07 (×2): 100 mg via ORAL
  Filled 2014-09-05 (×3): qty 2

## 2014-09-05 MED ORDER — DEXAMETHASONE SODIUM PHOSPHATE 10 MG/ML IJ SOLN
INTRAMUSCULAR | Status: DC | PRN
  Administered 2014-09-05: 4 mg via INTRAVENOUS

## 2014-09-05 MED ORDER — FENTANYL CITRATE (PF) 100 MCG/2ML IJ SOLN
INTRAMUSCULAR | Status: DC | PRN
  Administered 2014-09-05: 50 ug via INTRAVENOUS
  Administered 2014-09-05 (×2): 100 ug via INTRAVENOUS

## 2014-09-05 MED ORDER — BISACODYL 10 MG RE SUPP
10.0000 mg | Freq: Every day | RECTAL | Status: DC | PRN
Start: 2014-09-05 — End: 2014-09-07

## 2014-09-05 MED ORDER — PREGABALIN 75 MG PO CAPS
75.0000 mg | ORAL_CAPSULE | Freq: Three times a day (TID) | ORAL | Status: DC
  Administered 2014-09-05 – 2014-09-07 (×6): 75 mg via ORAL
  Filled 2014-09-05 (×6): qty 1

## 2014-09-05 MED ORDER — INSULIN REGULAR HUMAN 100 UNIT/ML IJ SOLN
4.0000 [IU] | Freq: Once | INTRAMUSCULAR | Status: DC
  Filled 2014-09-05: qty 0.04

## 2014-09-05 MED ORDER — ACETAMINOPHEN 10 MG/ML IV SOLN
1000.0000 mg | Freq: Once | INTRAVENOUS | Status: AC
  Administered 2014-09-05: 1000 mg via INTRAVENOUS
  Filled 2014-09-05: qty 100

## 2014-09-05 MED ORDER — ROCURONIUM BROMIDE 100 MG/10ML IV SOLN
INTRAVENOUS | Status: AC
  Filled 2014-09-05: qty 1

## 2014-09-05 MED ORDER — ALUM & MAG HYDROXIDE-SIMETH 200-200-20 MG/5ML PO SUSP: 30.0000 mL | ORAL | Status: DC | PRN

## 2014-09-05 MED ORDER — MONTELUKAST SODIUM 10 MG PO TABS
10.0000 mg | ORAL_TABLET | Freq: Every day | ORAL | Status: DC
  Administered 2014-09-05 – 2014-09-06 (×2): 10 mg via ORAL
  Filled 2014-09-05 (×3): qty 1

## 2014-09-05 MED ORDER — OXYCODONE-ACETAMINOPHEN 5-325 MG PO TABS
1.0000 | ORAL_TABLET | Freq: Three times a day (TID) | ORAL | Status: DC
  Administered 2014-09-05: 1 via ORAL
  Filled 2014-09-05: qty 1

## 2014-09-05 MED ORDER — LIDOCAINE HCL (CARDIAC) 20 MG/ML IV SOLN
INTRAVENOUS | Status: AC
  Filled 2014-09-05: qty 5

## 2014-09-05 MED ORDER — MIDAZOLAM HCL 2 MG/2ML IJ SOLN
INTRAMUSCULAR | Status: AC
  Filled 2014-09-05: qty 2

## 2014-09-05 MED ORDER — OXYCODONE HCL 5 MG PO TABS: 2.5000 mg | ORAL_TABLET | Freq: Three times a day (TID) | ORAL | Status: DC | PRN

## 2014-09-05 MED ORDER — FENTANYL CITRATE (PF) 100 MCG/2ML IJ SOLN
INTRAMUSCULAR | Status: AC
  Administered 2014-09-05: 25 ug via INTRAVENOUS
  Filled 2014-09-05: qty 2

## 2014-09-05 MED ORDER — DEXTROSE 5 % IV SOLN
3.0000 g | Freq: Once | INTRAVENOUS | Status: AC
  Administered 2014-09-05: 3 g via INTRAVENOUS
  Filled 2014-09-05: qty 3000

## 2014-09-05 MED ORDER — SIMETHICONE 80 MG PO CHEW: 80.0000 mg | CHEWABLE_TABLET | Freq: Four times a day (QID) | ORAL | Status: DC | PRN

## 2014-09-05 MED ORDER — ONDANSETRON HCL 4 MG/2ML IJ SOLN
INTRAMUSCULAR | Status: DC | PRN
  Administered 2014-09-05: 2 mg via INTRAVENOUS

## 2014-09-05 MED ORDER — SCOPOLAMINE 1 MG/3DAYS TD PT72
1.0000 | MEDICATED_PATCH | Freq: Once | TRANSDERMAL | Status: DC
  Administered 2014-09-05: 1.5 mg via TRANSDERMAL

## 2014-09-05 MED ORDER — SODIUM CHLORIDE 0.9 % IV SOLN: 20.0000 mL | Freq: Once | INTRAVENOUS | Status: DC

## 2014-09-05 MED ORDER — OXYCODONE-ACETAMINOPHEN 5-325 MG PO TABS: 1.0000 | ORAL_TABLET | Freq: Three times a day (TID) | ORAL | Status: DC | PRN

## 2014-09-05 MED ORDER — BUPIVACAINE LIPOSOME 1.3 % IJ SUSP
20.0000 mL | Freq: Once | INTRAMUSCULAR | Status: DC
  Filled 2014-09-05: qty 20

## 2014-09-05 MED ORDER — INSULIN ASPART 100 UNIT/ML ~~LOC~~ SOLN
4.0000 [IU] | Freq: Once | SUBCUTANEOUS | Status: AC
  Administered 2014-09-05: 4 [IU] via SUBCUTANEOUS

## 2014-09-05 MED ORDER — DEXTROSE-NACL 5-0.45 % IV SOLN
INTRAVENOUS | Status: DC
  Administered 2014-09-06: 01:00:00 via INTRAVENOUS

## 2014-09-05 MED ORDER — GLYCOPYRROLATE 0.2 MG/ML IJ SOLN
INTRAMUSCULAR | Status: AC
  Filled 2014-09-05: qty 5

## 2014-09-05 MED ORDER — LIDOCAINE HCL (CARDIAC) 20 MG/ML IV SOLN
INTRAVENOUS | Status: DC | PRN
  Administered 2014-09-05: 100 mg via INTRAVENOUS

## 2014-09-05 MED ORDER — BUDESONIDE-FORMOTEROL FUMARATE 160-4.5 MCG/ACT IN AERO
2.0000 | INHALATION_SPRAY | Freq: Two times a day (BID) | RESPIRATORY_TRACT | Status: DC
  Administered 2014-09-05 – 2014-09-07 (×4): 2 via RESPIRATORY_TRACT
  Filled 2014-09-05: qty 6

## 2014-09-05 MED ORDER — FENTANYL 10 MCG/ML IV SOLN
INTRAVENOUS | Status: DC
  Administered 2014-09-05: 15:00:00 via INTRAVENOUS
  Administered 2014-09-05: 130 ug via INTRAVENOUS
  Administered 2014-09-06: 18.01 ug via INTRAVENOUS
  Administered 2014-09-06: 40 ug via INTRAVENOUS
  Filled 2014-09-05: qty 50

## 2014-09-05 MED ORDER — MIDAZOLAM HCL 2 MG/2ML IJ SOLN
INTRAMUSCULAR | Status: DC | PRN
  Administered 2014-09-05: 1 mg via INTRAVENOUS

## 2014-09-05 MED ORDER — LOSARTAN POTASSIUM-HCTZ 100-25 MG PO TABS: 1.0000 | ORAL_TABLET | Freq: Every day | ORAL | Status: DC

## 2014-09-05 MED ORDER — DEXAMETHASONE SODIUM PHOSPHATE 4 MG/ML IJ SOLN
INTRAMUSCULAR | Status: AC
  Filled 2014-09-05: qty 1

## 2014-09-05 MED ORDER — GLIPIZIDE 10 MG PO TABS
10.0000 mg | ORAL_TABLET | Freq: Two times a day (BID) | ORAL | Status: DC
  Administered 2014-09-06 – 2014-09-07 (×4): 10 mg via ORAL
  Filled 2014-09-05 (×6): qty 1

## 2014-09-05 MED ORDER — HEPARIN SODIUM (PORCINE) 5000 UNIT/ML IJ SOLN
5000.0000 [IU] | Freq: Three times a day (TID) | INTRAMUSCULAR | Status: DC
  Administered 2014-09-05: 5000 [IU] via SUBCUTANEOUS
  Filled 2014-09-05 (×5): qty 1

## 2014-09-05 MED ORDER — OXYCODONE HCL ER 10 MG PO T12A: 10.0000 mg | EXTENDED_RELEASE_TABLET | Freq: Two times a day (BID) | ORAL | Status: DC

## 2014-09-05 MED ORDER — FENTANYL CITRATE (PF) 100 MCG/2ML IJ SOLN
25.0000 ug | INTRAMUSCULAR | Status: DC | PRN
  Administered 2014-09-05 (×4): 25 ug via INTRAVENOUS

## 2014-09-05 MED ORDER — OXYCODONE-ACETAMINOPHEN 7.5-325 MG PO TABS: 1.0000 | ORAL_TABLET | Freq: Three times a day (TID) | ORAL | Status: DC

## 2014-09-05 MED ORDER — OXYCODONE-ACETAMINOPHEN 7.5-325 MG PO TABS
1.0000 | ORAL_TABLET | Freq: Three times a day (TID) | ORAL | Status: DC
  Administered 2014-09-06 – 2014-09-07 (×5): 1 via ORAL
  Filled 2014-09-05 (×5): qty 1

## 2014-09-05 MED ORDER — FLEET ENEMA 7-19 GM/118ML RE ENEM: 1.0000 | ENEMA | Freq: Once | RECTAL | Status: AC | PRN

## 2014-09-05 MED ORDER — ONDANSETRON HCL 4 MG PO TABS: 4.0000 mg | ORAL_TABLET | Freq: Four times a day (QID) | ORAL | Status: DC | PRN

## 2014-09-05 MED ORDER — DEXMEDETOMIDINE HCL IN NACL 200 MCG/50ML IV SOLN
INTRAVENOUS | Status: AC
  Filled 2014-09-05: qty 50

## 2014-09-05 MED ORDER — ROCURONIUM BROMIDE 100 MG/10ML IV SOLN
INTRAVENOUS | Status: DC | PRN
  Administered 2014-09-05: 20 mg via INTRAVENOUS
  Administered 2014-09-05: 10 mg via INTRAVENOUS
  Administered 2014-09-05: 50 mg via INTRAVENOUS

## 2014-09-05 MED ORDER — BUPIVACAINE HCL (PF) 0.5 % IJ SOLN
INTRAMUSCULAR | Status: DC | PRN
  Administered 2014-09-05: 30 mL

## 2014-09-05 SURGICAL SUPPLY — 44 items
CANISTER SUCT 3000ML (MISCELLANEOUS) ×7 IMPLANT
CLOTH BEACON ORANGE TIMEOUT ST (SAFETY) ×7 IMPLANT
CONT PATH 16OZ SNAP LID 3702 (MISCELLANEOUS) ×7 IMPLANT
DECANTER SPIKE VIAL GLASS SM (MISCELLANEOUS) ×7 IMPLANT
DISSECTOR SPONGE CHERRY (GAUZE/BANDAGES/DRESSINGS) ×7 IMPLANT
DRAPE CESAREAN BIRTH W POUCH (DRAPES) ×7 IMPLANT
DRAPE WARM FLUID 44X44 (DRAPE) ×7 IMPLANT
DRESSING DISP NPWT PICO 4X12 (MISCELLANEOUS) ×7 IMPLANT
DURAPREP 26ML APPLICATOR (WOUND CARE) ×21 IMPLANT
GAUZE SPONGE 4X4 12PLY STRL (GAUZE/BANDAGES/DRESSINGS) ×7 IMPLANT
GAUZE SPONGE 4X4 16PLY XRAY LF (GAUZE/BANDAGES/DRESSINGS) ×7 IMPLANT
GLOVE BIO SURGEON STRL SZ7 (GLOVE) ×35 IMPLANT
GLOVE BIOGEL PI IND STRL 6 (GLOVE) ×15 IMPLANT
GLOVE BIOGEL PI IND STRL 7.0 (GLOVE) ×30 IMPLANT
GLOVE BIOGEL PI INDICATOR 6 (GLOVE) ×6
GLOVE BIOGEL PI INDICATOR 7.0 (GLOVE) ×12
GLOVE ECLIPSE 6.5 STRL STRAW (GLOVE) ×28 IMPLANT
GLOVE NEODERM STER SZ 7 (GLOVE) ×14 IMPLANT
GLOVE SURG SS PI 7.0 STRL IVOR (GLOVE) ×70 IMPLANT
GOWN STRL REUS W/TWL LRG LVL3 (GOWN DISPOSABLE) ×35 IMPLANT
NEEDLE HYPO 22GX1.5 SAFETY (NEEDLE) ×7 IMPLANT
NS IRRIG 1000ML POUR BTL (IV SOLUTION) ×7 IMPLANT
PACK ABDOMINAL GYN (CUSTOM PROCEDURE TRAY) ×7 IMPLANT
PAD MAGNETIC INST (MISCELLANEOUS) ×7 IMPLANT
PAD OB MATERNITY 4.3X12.25 (PERSONAL CARE ITEMS) ×7 IMPLANT
PROTECTOR NERVE ULNAR (MISCELLANEOUS) ×7 IMPLANT
SPONGE LAP 18X18 X RAY DECT (DISPOSABLE) ×35 IMPLANT
STAPLER VISISTAT 35W (STAPLE) ×7 IMPLANT
SURGIFLO W/THROMBIN 8M KIT (HEMOSTASIS) ×7 IMPLANT
SUT PDS AB 0 CTX 60 (SUTURE) ×7 IMPLANT
SUT PLAIN 2 0 XLH (SUTURE) ×7 IMPLANT
SUT VIC AB 0 CT1 18XCR BRD8 (SUTURE) ×15 IMPLANT
SUT VIC AB 0 CT1 27 (SUTURE) ×6
SUT VIC AB 0 CT1 27XBRD ANBCTR (SUTURE) ×10 IMPLANT
SUT VIC AB 0 CT1 27XCR 8 STRN (SUTURE) ×5 IMPLANT
SUT VIC AB 0 CT1 36 (SUTURE) ×7 IMPLANT
SUT VIC AB 0 CT1 8-18 (SUTURE) ×6
SUT VIC AB 3-0 CT1 27 (SUTURE) ×6
SUT VIC AB 3-0 CT1 TAPERPNT 27 (SUTURE) ×15 IMPLANT
SUT VICRYL 0 TIES 12 18 (SUTURE) ×7 IMPLANT
SYR CONTROL 10ML LL (SYRINGE) ×7 IMPLANT
TOWEL OR 17X24 6PK STRL BLUE (TOWEL DISPOSABLE) ×21 IMPLANT
TRAY FOLEY CATH SILVER 14FR (SET/KITS/TRAYS/PACK) ×7 IMPLANT
WATER STERILE IRR 1000ML POUR (IV SOLUTION) ×7 IMPLANT

## 2014-09-05 NOTE — Anesthesia Postprocedure Evaluation (Signed)
  Anesthesia Post-op Note  Patient: Angelica Ortiz  Procedure(s) Performed: Procedure(s): BILATERAL SALPINGECTOMY (Bilateral) HYSTERECTOMY SUPRACERVICAL ABDOMINAL (N/A) abdominal OOPHERECTOMY (Right) OVARIAN CYSTECTOMY and portion of left ovary removed  (Left)  Patient Location: PACU  Anesthesia Type:General  Level of Consciousness: awake, alert  and oriented  Airway and Oxygen Therapy: Patient Spontanous Breathing and Patient connected to nasal cannula oxygen  Post-op Pain: moderate  Post-op Assessment: Post-op Vital signs reviewed, Patient's Cardiovascular Status Stable, Respiratory Function Stable and Patent Airway  Post-op Vital Signs: Reviewed and stable  Last Vitals:  Filed Vitals:   09/05/14 1315  BP: 153/76  Pulse: 88  Temp:   Resp: 18    Complications: No apparent anesthesia complications

## 2014-09-05 NOTE — Transfer of Care (Signed)
Immediate Anesthesia Transfer of Care Note  Patient: Angelica Ortiz  Procedure(s) Performed: Procedure(s): BILATERAL SALPINGECTOMY (Bilateral) HYSTERECTOMY SUPRACERVICAL ABDOMINAL (N/A) abdominal OOPHERECTOMY (Right) OVARIAN CYSTECTOMY and portion of left ovary removed  (Left)  Patient Location: PACU  Anesthesia Type:General  Level of Consciousness: awake, alert  and oriented  Airway & Oxygen Therapy: Patient Spontanous Breathing and Patient connected to face mask oxygen  Post-op Assessment: Report given to RN, Post -op Vital signs reviewed and stable and Patient moving all extremities X 4  Post vital signs: Reviewed and stable  Last Vitals:  Filed Vitals:   09/05/14 0817  BP: 151/89  Pulse: 103  Temp: 37 C  Resp: 18    Complications: No apparent anesthesia complications

## 2014-09-05 NOTE — Op Note (Signed)
09/05/2014  12:45 PM  PATIENT:  Angelica Ortiz  49 y.o. female  PRE-OPERATIVE DIAGNOSIS:  LEFT ADNEXAL MASS; pelvic pain; history of recurrent tuboovarian abscesses   POST-OPERATIVE DIAGNOSIS: as above with extensive pelvic adhesions    PROCEDURE:  Procedure(s): BILATERAL SALPINGECTOMY (Bilateral) HYSTERECTOMY SUPRACERVICAL ABDOMINAL (N/A) abdominal OOPHERECTOMY (Right) OVARIAN CYSTECTOMY and portion of left ovary removed  (Left)  SURGEON:  Surgeon(s) and Role:    * Willodean Rosenthalarolyn Harraway-Smith, MD - Primary    * Lesly DukesKelly H Leggett, MD - Assisting  ANESTHESIA:   general  EBL:  Total I/O In: 2400 [I.V.:2400] Out: 450 [Urine:50; Blood:400]  BLOOD ADMINISTERED:none  DRAINS: none   LOCAL MEDICATIONS USED:  MARCAINE     SPECIMEN:  Source of Specimen:  bilateral salpingectomy with right ophorectomy and partial left ophorectomy   DISPOSITION OF SPECIMEN:  PATHOLOGY  COUNTS:  YES  TOURNIQUET:  * No tourniquets in log *  DICTATION: .Note written in EPIC  PLAN OF CARE: Admit to inpatient   PATIENT DISPOSITION:  PACU - hemodynamically stable.   Delay start of Pharmacological VTE agent (>24hrs) due to surgical blood loss or risk of bleeding: no  Complications: none immediate  INDICATIONS: The patient is a 49 y.o. [id number] with the aforementioned diagnoses who desires definitive surgical management. On the preoperative visit, the risks, benefits, indications, and alternatives of the procedure were reviewed with the patient.  On the day of surgery, the risks of surgery were again discussed with the patient including but not limited to: bleeding which may require transfusion or reoperation; infection which may require antibiotics; injury to bowel, bladder, ureters or other surrounding organs; need for additional procedures; thromboembolic phenomenon, incisional problems and other postoperative/anesthesia complications. Written informed consent was obtained.    OPERATIVE FINDINGS: A 10 week  size uterus with normal tube and ovary on the right side.  The left ovary was densely adherent to the pelvic side wall and to bowel.  It also contained a hemorrhagic cyst.  The left fallopian tube was also adherent to the sidewall and to bowel. There was bowel adherent to the posterior uterus.    DESCRIPTION OF PROCEDURE:  The patient received intravenous antibiotics and had sequential compression devices applied to her lower extremities while in the preoperative area.   She was taken to the operating room and placed under general anesthesia without difficulty.The abdomen and perineum and vagina were prepped and draped in a sterile manner, and she was placed in a dorsal supine position. A traxi Panniculus retractor was placed with the extender without difficulty.   A Foley catheter was inserted into the bladder and attached to constant drainage. After an adequate timeout was performed, a transverse skin incision was made. This incision was taken down to the fascia using electrocautery with care given to maintain good hemostasis. The fascia was incised in the midline and the fascial incision was then extended bilaterally using electrocautery without difficulty. The fascia was then dissected off the underlying rectus muscles using blunt and sharp dissection. The rectus muscles were split bluntly in the midline and the peritoneum entered sharply without complication. This peritoneal incision was then extended superiorly and inferiorly with care given to prevent bowel or bladder injury. Attention was then turned to the pelvis. The fundus of the uterus was identified and grasped with a Lahey clamp. There was bowel adherent to the uterus which was carefully taken down with Metzenbaum scissors and pickup clamps.  The bowel was packed away with moist laparotomy sponges. The uterus  at this point was mobilized and was delivered up out of the abdomen.  The bowel was packed away with moist laparotomy sponges. The round  ligament on the right side was clamped, suture ligated with 0 Vicryl, and transected allowing entry into the broad ligament. The right uteroovarian ligament was doubly clamped and suture ligated. Of note, all sutures used in this procedure are 0 Vicryl unless otherwise noted.   The pedicle was then secured with a free tie.  A hole was created in the clear portion of the posterior broad ligament and the infundibulopelvic ligament clamped on the patient's right side. This pedicle was then clamped, cut, and doubly suture ligated with good hemostasis.   A bladder flap was then created sharply using the Metzenbaum scissors.  The bladder was then bluntly dissected off the lower uterine segment and cervix with good hemostasis noted. The left uteroovarian ligament and the broad ligament was serially clamped, transected and suture ligated to the level of the uterine artery.  The uterine arteries were then skeletonized bilaterally and then clamped, cut, and doubly suture ligated with care given to prevent ureteral injury. The uterus was then amputated across the lower uterine segment leaving the cervix intact. The specimen was sent to pathology. The cervical canal was coagulated, and the anterior and posterior peritoneal edges were then reapproximated in the midline over the cervical stump without complication.  A supracervical hysterectomy was performed and due to extensive adhesions the cervix was left in place.  The ovary on the left side was identified and found to contain a hemorrhagic cyst.  The cyst was removed but the ovary was densely adherent to the pelvic side wall and posteriorly to bowel.  The ovary was clamped midway and transected and suture ligated with care to avoid the bowel.  A portion of the fallopian tube was attached and resected as well.  Excellent hemostasis was noted.   The pelvis was irrigated and hemostasis was reconfirmed at all pedicles and along the pelvic sidewall.   All laparotomy sponges and  instruments were removed from the abdomen. An interrupted suture of 0 vicryl was placed across the peritoneum which included a portion of the rectus muscles on both side to approximate the peritoneum.  The fascia was also closed in a running fashion using 1 Looped PDS. The subcutaneous layer was reapproximated with 2-0 plain gut. The skin was closed with staples.  A PICO wound vacuum was applied. Sponge, lap, needle, and instrument counts were correct times two. The patient was taken to the recovery area awake, extubated and in stable condition.  Anders Hohmann L. Erin Fulling, M.D., [facility] 09/05/2014 12:47 PM

## 2014-09-05 NOTE — H&P (Signed)
HPI Pt presents for hosp f/u and ED f/u. She reports that she is continuing to bleed heavy and have significant pain. She reports that the pain wakes her up at night and has require her to increase her usage of pain meds. She denies weight loss. The pt wants definitive tx with a hyst. She reports hot flushes at night. No fever or chills.   She reports that she has been unable to get her CPAP and is now being asked by her Pain Management specialist to repeat her sleep study.   Past Medical History  Diagnosis Date  . Diverticulitis   . Asthma   . Hypertension   . Fibromyalgia   . Lupus     no meds  . GERD (gastroesophageal reflux disease)   . Overactive bladder   . Migraine   . Morbid obesity 03/30/2013  . Pulmonary embolism     Resolved -At age 49 broken right leg, blood clot broke off - into lung  . Diabetes mellitus without complication     Type 2  . Anemia   . Overactive bladder   . Seasonal allergies   . Sleep apnea 3/15    Does not have CPAP machine   Past Surgical History  Procedure Laterality Date  . Cholecystectomy    . Appendectomy    . Cesarean section  2001    x 1  . Rotator cuff repair      right  . Laminectomy      L4-L5  . Orif ankle fracture Left   . Dilation and curettage of uterus      x 3 for AUB  . Laparoscopic ovarian cystectomy Right 1994  . Tonsillectomy    . Hysteroscopy with novasure N/A 03/14/2014    Procedure: HYSTEROSCOPY WITH NOVASURE; Surgeon: Willodean Rosenthalarolyn Harraway-Smith, MD; Location: [facility]; Service: Gynecology; Laterality: N/A;   Current Outpatient Prescriptions on File Prior to Visit  Medication Sig Dispense Refill  . albuterol (PROVENTIL HFA;VENTOLIN HFA) 108 (90 BASE) MCG/ACT inhaler Inhale 2 puffs into the lungs 2 (two) times daily.    Marland Kitchen. amLODipine (NORVASC) 10 MG tablet Take 1 tablet (10 mg  total) by mouth daily. 14 tablet 0  . budesonide-formoterol (SYMBICORT) 160-4.5 MCG/ACT inhaler Inhale 2 puffs into the lungs 2 (two) times daily.     . diclofenac (VOLTAREN) 75 MG EC tablet Take 1 tablet (75 mg total) by mouth 2 (two) times daily. 30 tablet 1  . famotidine (PEPCID) 20 MG tablet Take 1 tablet (20 mg total) by mouth 2 (two) times daily. (Patient taking differently: Take 20 mg by mouth daily. ) 10 tablet 0  . ferrous sulfate 325 (65 FE) MG tablet Take 325 mg by mouth 3 (three) times daily with meals.    . fluticasone (FLONASE) 50 MCG/ACT nasal spray Place 2 sprays into both nostrils daily.     Marland Kitchen. glipiZIDE (GLUCOTROL) 10 MG tablet Take 10 mg by mouth 2 (two) times daily before a meal.     . losartan-hydrochlorothiazide (HYZAAR) 100-25 MG per tablet Take 1 tablet by mouth daily.    . megestrol (MEGACE) 40 MG tablet Take 2 tablets (80 mg total) by mouth 2 (two) times daily.    . metFORMIN (GLUCOPHAGE) 1000 MG tablet Take 1,000 mg by mouth 2 (two) times daily with a meal.    . montelukast (SINGULAIR) 10 MG tablet Take 10 mg by mouth at bedtime.    Marland Kitchen. omeprazole (PRILOSEC) 40 MG capsule Take 40 mg by mouth every  evening.    Marland Kitchen oxybutynin (DITROPAN-XL) 10 MG 24 hr tablet Take 10 mg by mouth at bedtime.     . OxyCODONE (OXYCONTIN) 10 mg T12A 12 hr tablet Take 1 tablet (10 mg total) by mouth every 12 (twelve) hours. 20 tablet 0  . oxyCODONE-acetaminophen (PERCOCET) 7.5-325 MG per tablet Take 1 tablet by mouth 5 (five) times daily. 30 tablet 0  . pregabalin (LYRICA) 75 MG capsule Take 75 mg by mouth 3 (three) times daily.     . rizatriptan (MAXALT) 10 MG tablet Take 10 mg by mouth 2 (two) times daily as needed for migraine (migraine). May repeat in 2 hours if needed    . sitaGLIPtin (JANUVIA) 100 MG tablet Take 100 mg by mouth daily.    Marland Kitchen terbinafine (LAMISIL) 250 MG tablet Take 250 mg by mouth daily.     . [DISCONTINUED] lisinopril-hydrochlorothiazide (PRINZIDE,ZESTORETIC) 20-12.5 MG per tablet Take 1 tablet by mouth at bedtime.      No current facility-administered medications on file prior to visit.   Allergies  Allergen Reactions  . Dilaudid [Hydromorphone Hcl] Hives and Itching  . Levaquin [Levofloxacin In D5w] Hives, Shortness Of Breath and Itching  . Tangerine Flavor Swelling    Lip and tongue swelling with tangerines  . Ace Inhibitors Swelling        Review of Systems     Objective:   Physical Exam Pulse 103  Temp(Src) 98.6 F (37 C) (Oral)  Resp 18  SpO2 96% Pt is in NAD Lungs: CTA CV: RRR Abd: obese, diffusely tender. Greatest in the right lower quad. No rebound or guarding. There are multiple well healed scars including a low transverse incision: a large left upper quad transverse scar and several small well healed incisions. Gyn exam: not repeated   CBC    Component Value Date/Time   WBC 9.2 07/27/2014 1050   RBC 4.48 07/27/2014 1050   HGB 9.2* 07/27/2014 1050   HCT 30.1* 07/27/2014 1050   PLT 303 07/27/2014 1050   MCV 67.2* 07/27/2014 1050   MCH 20.5* 07/27/2014 1050   MCHC 30.6 07/27/2014 1050   RDW 20.9* 07/27/2014 1050   LYMPHSABS 1.2 07/01/2014 0722   MONOABS 1.2* 07/01/2014 0722   EOSABS 0.1 07/01/2014 0722   BASOSABS 0.0 07/01/2014 0722        07/14/2014  CLINICAL DATA: Vaginal bleeding this morning with severe left lower quadrant pain.  EXAM: CT ABDOMEN AND PELVIS WITH CONTRAST  TECHNIQUE: Multidetector CT imaging of the abdomen and pelvis was performed using the standard protocol following bolus administration of intravenous contrast.  CONTRAST: OMNIPAQUE IOHEXOL 300 MG/ML SOLN  COMPARISON: Ultrasound pelvis 07/14/2014. CT abdomen and pelvis 06/28/2014.  FINDINGS: Minimal dependent changes in the lung bases.  Surgical absence of the gallbladder. The liver, spleen,  pancreas, adrenal glands, kidneys, abdominal aorta, and inferior vena cava are unremarkable. Mild prominence of retroperitoneal lymph nodes without pathologic enlargement, likely reactive. Stomach, small bowel, and colon are not abnormally distended. Stool fills the colon. No free air or free fluid in the abdomen. Abdominal wall musculature appears intact.  Pelvis: Left adnexal process again demonstrated with soft tissue changes and stranding in the pelvic fat. This area today measures about 4.6 x 5.9 cm, decreasing in size since previous study. Appearance is consistent with improving infection/tubo-ovarian abscess. No free pelvic fluid collections. No pelvic lymphadenopathy. Uterus is not enlarged. Appendix is surgically absent. Degenerative changes in the spine. No destructive bone lesions.  IMPRESSION: Improving left adnexal  lesion and inflammatory stranding in the left pelvis since prior study. No acute process otherwise demonstrated in the abdomen or pelvis. No evidence of bowel obstruction.     Assessment:     Recurrent TOA's and menorrhagia- Reviewed with pt prolonged atbx with expectant management as opposed to surgery. I have reviewed with pt the increased surgical risks due to her obesity, sleep apnea, multiple prev surgeries despite this pt reports that the pain is unbearable and although she understands the risk she wants to proceed with TAH/BSO.     Plan:     Patient desires surgical management with TAH/BSO. The risks of surgery were discussed in detail with the patient including but not limited to: bleeding which may require transfusion or reoperation; infection which may require prolonged hospitalization or re-hospitalization and antibiotic therapy; injury to bowel, bladder, ureters and major vessels or other surrounding organs; need for additional procedures including laparotomy; thromboembolic phenomenon, incisional problems and other postoperative or  anesthesia complications. Patient was told that the likelihood that her condition and symptoms will be treated effectively with this surgical management was very high; the postoperative expectations were also discussed in detail. The patient also understands the alternative treatment options which were discussed in full. All questions were answered.

## 2014-09-05 NOTE — Brief Op Note (Signed)
09/05/2014  12:45 PM  PATIENT:  Angelica Ortiz  49 y.o. female  PRE-OPERATIVE DIAGNOSIS:  LEFT ADNEXAL MASS; pelvic pain   POST-OPERATIVE DIAGNOSIS:  LEFT ADNEXAL MASS; pelvic pain   PROCEDURE:  Procedure(s): BILATERAL SALPINGECTOMY (Bilateral) HYSTERECTOMY SUPRACERVICAL ABDOMINAL (N/A) abdominal OOPHERECTOMY (Right) OVARIAN CYSTECTOMY and portion of left ovary removed  (Left)  SURGEON:  Surgeon(s) and Role:    * Willodean Rosenthalarolyn Harraway-Smith, MD - Primary    * Lesly DukesKelly H Leggett, MD - Assisting  ANESTHESIA:   general  EBL:  Total I/O In: 2400 [I.V.:2400] Out: 450 [Urine:50; Blood:400]  BLOOD ADMINISTERED:none  DRAINS: none   LOCAL MEDICATIONS USED:  MARCAINE     SPECIMEN:  Source of Specimen:  bilateral salpingectomy with right ophorectomy and partial left ophorectomy   DISPOSITION OF SPECIMEN:  PATHOLOGY  COUNTS:  YES  TOURNIQUET:  * No tourniquets in log *  DICTATION: .Note written in EPIC  PLAN OF CARE: Admit to inpatient   PATIENT DISPOSITION:  PACU - hemodynamically stable.   Delay start of Pharmacological VTE agent (>24hrs) due to surgical blood loss or risk of bleeding: no

## 2014-09-05 NOTE — Progress Notes (Signed)
Spoke with Dr Adrian BlackwaterStinson about giving Oxycodone with PCA Fentanyl, he stated okay to give as ordered.

## 2014-09-05 NOTE — Anesthesia Procedure Notes (Signed)
Procedure Name: Intubation Date/Time: 09/05/2014 9:39 AM Performed by: Harriett RushADELOYE, Findlay Dagher A Pre-anesthesia Checklist: Patient identified, Emergency Drugs available, Suction available and Patient being monitored Patient Re-evaluated:Patient Re-evaluated prior to inductionOxygen Delivery Method: Circle system utilized Preoxygenation: Pre-oxygenation with 100% oxygen Intubation Type: IV induction Ventilation: Two handed mask ventilation required Laryngoscope Size: Miller and 2 Grade View: Grade III Tube type: Oral Tube size: 7.0 mm Number of attempts: 1 Placement Confirmation: ETT inserted through vocal cords under direct vision,  positive ETCO2 and breath sounds checked- equal and bilateral Secured at: 22 cm Tube secured with: Tape Dental Injury: Teeth and Oropharynx as per pre-operative assessment  Difficulty Due To: Difficulty was anticipated, Difficult Airway- due to large tongue, Difficult Airway- due to limited oral opening and Difficult Airway- due to anterior larynx Future Recommendations: Recommend- induction with short-acting agent, and alternative techniques readily available

## 2014-09-06 ENCOUNTER — Encounter (HOSPITAL_COMMUNITY): Payer: Self-pay | Admitting: Obstetrics & Gynecology

## 2014-09-06 LAB — CBC
HEMATOCRIT: 26.5 % — AB (ref 36.0–46.0)
HEMOGLOBIN: 8 g/dL — AB (ref 12.0–15.0)
MCH: 20.5 pg — ABNORMAL LOW (ref 26.0–34.0)
MCHC: 30.2 g/dL (ref 30.0–36.0)
MCV: 67.8 fL — AB (ref 78.0–100.0)
PLATELETS: 296 10*3/uL (ref 150–400)
RBC: 3.91 MIL/uL (ref 3.87–5.11)
RDW: 20.2 % — AB (ref 11.5–15.5)
WBC: 10.9 10*3/uL — ABNORMAL HIGH (ref 4.0–10.5)

## 2014-09-06 LAB — BASIC METABOLIC PANEL
Anion gap: 8 (ref 5–15)
BUN: 10 mg/dL (ref 6–20)
CALCIUM: 8.5 mg/dL — AB (ref 8.9–10.3)
CO2: 27 mmol/L (ref 22–32)
Chloride: 101 mmol/L (ref 101–111)
Creatinine, Ser: 0.83 mg/dL (ref 0.44–1.00)
GFR calc Af Amer: >60 mL/min (ref >60)
GLUCOSE: 208 mg/dL — AB (ref 65–99)
Potassium: 4 mmol/L (ref 3.5–5.1)
SODIUM: 136 mmol/L (ref 135–145)

## 2014-09-06 LAB — GLUCOSE, CAPILLARY
GLUCOSE-CAPILLARY: 127 mg/dL — AB (ref 65–99)
Glucose-Capillary: 87 mg/dL (ref 65–99)

## 2014-09-06 MED ORDER — IBUPROFEN 600 MG PO TABS
600.0000 mg | ORAL_TABLET | Freq: Four times a day (QID) | ORAL | Status: DC | PRN
  Administered 2014-09-07: 600 mg via ORAL
  Filled 2014-09-06: qty 1

## 2014-09-06 MED ORDER — INSULIN ASPART 100 UNIT/ML ~~LOC~~ SOLN
0.0000 [IU] | Freq: Three times a day (TID) | SUBCUTANEOUS | Status: DC
  Administered 2014-09-07: 3 [IU] via SUBCUTANEOUS
  Administered 2014-09-07: 4 [IU] via SUBCUTANEOUS

## 2014-09-06 MED ORDER — LACTATED RINGERS IV BOLUS (SEPSIS): 125.0000 mL | INTRAVENOUS | Status: AC

## 2014-09-06 MED ORDER — INSULIN ASPART 100 UNIT/ML ~~LOC~~ SOLN: 0.0000 [IU] | Freq: Every day | SUBCUTANEOUS | Status: DC

## 2014-09-06 MED ORDER — DIPHENHYDRAMINE HCL 12.5 MG/5ML PO ELIX
12.5000 mg | ORAL_SOLUTION | Freq: Four times a day (QID) | ORAL | Status: DC | PRN
  Administered 2014-09-06: 12.5 mg via ORAL
  Filled 2014-09-06: qty 5

## 2014-09-06 MED ORDER — DIPHENHYDRAMINE HCL 50 MG/ML IJ SOLN: 25.0000 mg | Freq: Four times a day (QID) | INTRAMUSCULAR | Status: DC | PRN

## 2014-09-06 MED ORDER — FENTANYL CITRATE (PF) 100 MCG/2ML IJ SOLN: 25.0000 ug | INTRAMUSCULAR | Status: DC | PRN

## 2014-09-06 NOTE — Progress Notes (Signed)
Fentanyl pca wasted 22 mL in sink. Witnessed with Luisa Dagoanya Corbitt, RN.

## 2014-09-06 NOTE — Plan of Care (Signed)
Problem: Phase I Progression Outcomes Goal: Pain controlled with appropriate interventions Outcome: Progressing Patient has chronic pain.  She sleeps until awaken to inquire about pain score.  She always at a 6-7 or above.

## 2014-09-06 NOTE — Anesthesia Postprocedure Evaluation (Signed)
  Anesthesia Post-op Note  Patient: Angelica Ortiz  Procedure(s) Performed: Procedure(s): BILATERAL SALPINGECTOMY (Bilateral) HYSTERECTOMY SUPRACERVICAL ABDOMINAL (N/A) abdominal OOPHERECTOMY (Right) OVARIAN CYSTECTOMY and portion of left ovary removed  (Left)  Patient Location: [facility]  Anesthesia Type:General  Level of Consciousness: awake, alert  and oriented  Airway and Oxygen Therapy: Patient Spontanous Breathing  Post-op Pain: none  Post-op Assessment: Post-op Vital signs reviewed, Patient's Cardiovascular Status Stable, Respiratory Function Stable, Adequate PO intake and Pain level controlled  Post-op Vital Signs: Reviewed and stable  Last Vitals:  Filed Vitals:   09/06/14 0524  BP: 145/90  Pulse: 57  Temp: 37.3 C  Resp: 16    Complications: No apparent anesthesia complications

## 2014-09-06 NOTE — Progress Notes (Signed)
1 Day Post-Op Procedure(s) (LRB): BILATERAL SALPINGECTOMY (Bilateral) HYSTERECTOMY SUPRACERVICAL ABDOMINAL (N/A) abdominal OOPHERECTOMY (Right) OVARIAN CYSTECTOMY and portion of left ovary removed  (Left)  Subjective: Patient reports tolerating PO and + flatus.  Her foley is out but she has not yet voided.  She reports that her pain is adequate.  She has walked to the door of her room and to the nurses station.  She had nausea yesterday which has resolved. No emesis. She has ordered breakfast.   She does report itching.  Objective: I have reviewed patient's vital signs, intake and output, medications and labs. BP 145/90 mmHg  Pulse 57  Temp(Src) 99.1 F (37.3 C) (Oral)  Resp 16  Ht 5\' 6"  (1.676 m)  Wt 365 lb (165.563 kg)  BMI 58.94 kg/m2  SpO2 100% I/O last 3 completed shifts: In: 4729.6 [P.O.:420; I.V.:4309.6] Out: 4175 [Urine:3775; Blood:400]     General: alert and no distress Resp: clear to auscultation bilaterally Cardio: regular rate and rhythm, S1, S2 normal, no murmur, click, rub or gallop GI: normal findings: soft, non-tender and abnormal findings:  decreased bowel sounds Extremities: extremities normal, atraumatic, no cyanosis or edema  CBC    Component Value Date/Time   WBC 10.9* 09/06/2014 0646   RBC 3.91 09/06/2014 0646   HGB 8.0* 09/06/2014 0646   HCT 26.5* 09/06/2014 0646   PLT 296 09/06/2014 0646   MCV 67.8* 09/06/2014 0646   MCH 20.5* 09/06/2014 0646   MCHC 30.2 09/06/2014 0646   RDW 20.2* 09/06/2014 0646   LYMPHSABS 1.2 07/01/2014 0722   MONOABS 1.2* 07/01/2014 0722   EOSABS 0.1 07/01/2014 0722   BASOSABS 0.0 07/01/2014 0722    BMP Latest Ref Rng 09/06/2014 07/27/2014 06/29/2014  Glucose 65 - 99 mg/dL 161(W208(H) 960(A158(H) 540(J232(H)  BUN 6 - 20 mg/dL 10 15 17   Creatinine 0.44 - 1.00 mg/dL 8.110.83 9.140.85 7.820.91  Sodium 135 - 145 mmol/L 136 137 134(L)  Potassium 3.5 - 5.1 mmol/L 4.0 3.5 3.2(L)  Chloride 101 - 111 mmol/L 101 98 95(L)  CO2 22 - 32 mmol/L 27 26 30    Calcium 8.9 - 10.3 mg/dL 9.5(A8.5(L) 9.3 2.1(H8.3(L)     Assessment: s/p Procedure(s): BILATERAL SALPINGECTOMY (Bilateral) HYSTERECTOMY SUPRACERVICAL ABDOMINAL (N/A) abdominal OOPHERECTOMY (Right) OVARIAN CYSTECTOMY and portion of left ovary removed  (Left): stable  Plan: Advance diet Encourage ambulation Discontinue IV fluids ambulate in halls.    CBG monitoring due to elevated glucose with SSI when needed D/c PCA  LOS: 1 day    HARRAWAY-SMITH, Emaan Gary 09/06/2014, 8:37 AM

## 2014-09-06 NOTE — Progress Notes (Signed)
Inpatient Diabetes Program Recommendations  AACE/ADA: New Consensus Statement on Inpatient Glycemic Control (2013)  Target Ranges:  Prepandial:   less than 140 mg/dL      Peak postprandial:   less than 180 mg/dL (1-2 hours)      Critically ill patients:  140 - 180 mg/dL   Results for Angelica Ortiz, Angelica Ortiz (MRN [id number]) as of 09/06/2014 07:32  Ref. Range 09/05/2014 08:26 09/05/2014 12:44 09/05/2014 14:23  Glucose-Capillary Latest Ref Range: 65-99 mg/dL 956221 (H) 387244 (H) 564226 (H)    Diabetes history: DM2 Outpatient Diabetes medications: Metformin 1000 mg BID, Glipizide 10 mg BID, Januvia 100 mg daily Current orders for Inpatient glycemic control: Metformin 1000 mg BID, Glipizide 10 mg BID  Inpatient Diabetes Program Recommendations IV fluids: Please re-evaluate need for dextrose in IV fluids. Patient currently has D5 0.45%NS @ 125 ml/hr ordered. Correction (SSI): While inpatient, please consider ordering CBGs with Novolog resistant correction scale ACHS using regular Glycemic Control order set. HgbA1C: Please consider adding an A1C to blood in lab to evaluate glycemic control over the past 2-3 months.  Thanks, Orlando PennerMarie Hiya Point, RN, MSN, CCRN, CDE Diabetes Coordinator Inpatient Diabetes Program [phone] (Team Pager from 8am to 5pm) [phone] ([facility] office) [phone] [facility] office) [phone] [facility] office)

## 2014-09-06 NOTE — Progress Notes (Signed)
Fentanyl 2110mcg/ml pca was witnessed in sink. 3222mcg/ml was wasted. Witnessed byBrianna Williams,R.N.

## 2014-09-06 NOTE — Addendum Note (Signed)
Addendum  created 09/06/14 [id number] by Shanon PayorSuzanne M Bronc Brosseau, CRNA   Modules edited: Notes Section   Notes Section:  File: [id number]

## 2014-09-07 LAB — CBC
HCT: 24.5 % — ABNORMAL LOW (ref 36.0–46.0)
HEMOGLOBIN: 7.4 g/dL — AB (ref 12.0–15.0)
MCH: 20.7 pg — ABNORMAL LOW (ref 26.0–34.0)
MCHC: 30.2 g/dL (ref 30.0–36.0)
MCV: 68.6 fL — AB (ref 78.0–100.0)
Platelets: 288 10*3/uL (ref 150–400)
RBC: 3.57 MIL/uL — AB (ref 3.87–5.11)
RDW: 20.5 % — AB (ref 11.5–15.5)
WBC: 8.9 10*3/uL (ref 4.0–10.5)

## 2014-09-07 LAB — HEMOGLOBIN A1C
HEMOGLOBIN A1C: 7.4 % — AB (ref 4.8–5.6)
Mean Plasma Glucose: 166 mg/dL

## 2014-09-07 LAB — GLUCOSE, CAPILLARY
GLUCOSE-CAPILLARY: 93 mg/dL (ref 65–99)
Glucose-Capillary: 154 mg/dL — ABNORMAL HIGH (ref 65–99)

## 2014-09-07 MED ORDER — DOCUSATE SODIUM 100 MG PO CAPS: 100.0000 mg | ORAL_CAPSULE | Freq: Two times a day (BID) | ORAL | Status: DC

## 2014-09-07 NOTE — Discharge Summary (Signed)
Physician Discharge Summary  Patient ID: Angelica Ortiz MRN: [id number] DOB/AGE: Aug 25, 1965 49 y.o.  Admit date: 09/05/2014 Discharge date: 09/07/2014  Admission Diagnoses: recurrent pelvic abscesses; pelvic pain- female; DM. 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 Furthermore, the patient had an uncomplicated postoperative course.  By time of discharge, her pain was controlled on oral pain medications; she was ambulating, voiding without difficulty, tolerating regular diet and passing flatus.  She was deemed stable for discharge to home.     Consults: None  Significant Diagnostic Studies: labs: CBC, CMP  Treatments: IV hydration and surgery: Supracervical hyst with bilateral salpingectomy, right oophorectomy and partial left oophoerctomy   Discharge Exam: Blood pressure 110/55, pulse 99, temperature 98.2 F (36.8 C), temperature source Oral, resp. rate 16, height 5\' 6"  (1.676 m), weight 365 lb (165.563 kg), SpO2 99 %. General appearance: alert and no distress Resp: clear to auscultation bilaterally Cardio: regular rate and rhythm, S1, S2 normal, no murmur, click, rub or gallop GI: soft, non-tender; bowel sounds normal; no masses,  no organomegaly Skin: Skin color, texture, turgor normal. No rashes or lesions Incision/Wound:PICO wound vac in place.  Connected to suction.  Dressing dry.  CBC    Component Value Date/Time   WBC 8.9 09/07/2014 0525   RBC 3.57* 09/07/2014 0525   HGB 7.4* 09/07/2014 0525   HCT 24.5* 09/07/2014 0525   PLT 288 09/07/2014 0525   MCV 68.6* 09/07/2014 0525   MCH  20.7* 09/07/2014 0525   MCHC 30.2 09/07/2014 0525   RDW 20.5* 09/07/2014 0525   LYMPHSABS 1.2 07/01/2014 0722   MONOABS 1.2* 07/01/2014 0722   EOSABS 0.1 07/01/2014 0722   BASOSABS 0.0 07/01/2014 0722       Disposition: 01-Home or Self Care  Discharge Instructions    Call MD for:  difficulty breathing, headache or visual disturbances    Complete by:  As directed      Call MD for:  extreme fatigue    Complete by:  As directed      Call MD for:  hives    Complete by:  As directed      Call MD for:  persistant dizziness or light-headedness    Complete by:  As directed      Call MD for:  persistant nausea and vomiting    Complete by:  As directed      Call MD for:  redness, tenderness, or signs of infection (pain, swelling, redness, odor or green/yellow discharge around incision site)    Complete by:  As directed      Call MD for:  severe uncontrolled pain    Complete by:  As directed      Call MD for:  temperature >100.4    Complete by:  As directed      Diet - low sodium heart healthy    Complete by:  As directed      Diet Carb Modified    Complete by:  As directed      Discharge wound care:    Complete by:  As directed   Keep PICO wound vacuum connected to battery pack for  7 days     Driving Restrictions    Complete by:  As directed   No driving for 2 weeks     Increase activity slowly    Complete by:  As directed      Lifting restrictions    Complete by:  As directed   No heavy lifting for 4 weeks     Sexual Activity Restrictions    Complete by:  As directed   NOTHING in vagina (including intercourse) for 6 weeks            Medication List    STOP taking these medications        fluconazole 150 MG tablet  Commonly known as:  DIFLUCAN     OxyCODONE 10 mg T12a 12 hr tablet  Commonly known as:  OXYCONTIN      TAKE these medications        albuterol 108 (90 BASE) MCG/ACT inhaler  Commonly known as:  PROVENTIL HFA;VENTOLIN HFA  Inhale 2 puffs into the  lungs 2 (two) times daily.     amLODipine 10 MG tablet  Commonly known as:  NORVASC  Take 1 tablet (10 mg total) by mouth daily.     budesonide-formoterol 160-4.5 MCG/ACT inhaler  Commonly known as:  SYMBICORT  Inhale 2 puffs into the lungs 2 (two) times daily.     diclofenac 75 MG EC tablet  Commonly known as:  VOLTAREN  Take 1 tablet (75 mg total) by mouth 2 (two) times daily.     docusate sodium 100 MG capsule  Commonly known as:  COLACE  Take 1 capsule (100 mg total) by mouth 2 (two) times daily.     famotidine 20 MG tablet  Commonly known as:  PEPCID  Take 1 tablet (20 mg total) by mouth 2 (two) times daily.     ferrous sulfate 325 (65 FE) MG tablet  Take 325 mg by mouth 3 (three) times daily with meals.     fluticasone 50 MCG/ACT nasal spray  Commonly known as:  FLONASE  Place 2 sprays into both nostrils daily.     glipiZIDE 10 MG tablet  Commonly known as:  GLUCOTROL  Take 10 mg by mouth 2 (two) times daily before a meal.     losartan-hydrochlorothiazide 100-25 MG per tablet  Commonly known as:  HYZAAR  Take 1 tablet by mouth daily.     metFORMIN 1000 MG tablet  Commonly known as:  GLUCOPHAGE  Take 1,000 mg by mouth 2 (two) times daily with a meal.     montelukast 10 MG tablet  Commonly known as:  SINGULAIR  Take 10 mg by mouth at bedtime.     omeprazole 40 MG capsule  Commonly known as:  PRILOSEC  Take 40 mg by mouth every evening.     oxybutynin 10 MG 24 hr tablet  Commonly known as:  DITROPAN-XL  Take 10 mg by mouth at bedtime.     oxyCODONE-acetaminophen 7.5-325 MG per tablet  Commonly known as:  PERCOCET  Take 1 tablet by mouth 5 (five) times daily.     pregabalin 75 MG capsule  Commonly known as:  LYRICA  Take 75 mg by mouth 3 (three) times daily.     rizatriptan 10 MG tablet  Commonly known as:  MAXALT  Take 10 mg by mouth 2 (two) times daily as needed for migraine (migraine). May repeat in 2 hours if needed     sitaGLIPtin 100 MG tablet   Commonly known  as:  JANUVIA  Take 100 mg by mouth daily.           Follow-up Information    Follow up with [facility], Leiya Keesey, MD In 1 week.   Specialty:  Obstetrics and Gynecology   Contact information:   [address] [place] [address] [phone]       Signed: Willodean Rosenthal 09/07/2014, 3:58 PM

## 2014-09-07 NOTE — Progress Notes (Signed)
Late Entry:  09/06/14  1100a Case Manager notified by pt's Nurse of request for elevated commode seat and shower chair for home use.  CM spoke w/ the pt at bedside in room 303, discussed DME and HHC agencies. Pt stated that she had used [facility] in the past and would like to use them again.  CM discussed w/ Dr. Shawnie PonsPratt and in agreement w/ DME.  [facility] and they will deliver the DME to the home address on 09/07/14.  Pt aware of home delivery.  Please notify Case Management if any questions.  Roseanne Renoraci Elzia Hott [phone]

## 2014-09-07 NOTE — Progress Notes (Signed)
Pt verbalizes understanding of d/c instructions, medications, follow up appts, when to seek medical attention and belongings policy. IVs were removed without complications. No questions at this time. Pt d/c to main entrance via wheelchair accompanied by me and her husband who will be driving her home.Sheryn BisonGordon, Ladamien Rammel Warner

## 2014-09-07 NOTE — Discharge Instructions (Signed)
Abdominal Hysterectomy, Care After Refer to this sheet in the next few weeks. These instructions provide you with information on caring for yourself after your procedure. Your health care provider may also give you more specific instructions. Your treatment has been planned according to current medical practices, but problems sometimes occur. Call your health care provider if you have any problems or questions after your procedure.  WHAT TO EXPECT AFTER THE PROCEDURE After your procedure, it is typical to have the following:  Pain.  Feeling tired.  Poor appetite.  Less interest in sex. HOME CARE INSTRUCTIONS  It takes 4-6 weeks to recover from this surgery. Make sure you follow all your health care provider's instructions. Home care instructions may include:  Take pain medicines only as directed by your health care provider. Do not take over-the-counter pain medicines without checking with your health care provider first.  Change your bandage as directed by your health care provider.  Return to your health care provider to have your sutures taken out.  Take showers instead of baths for 2-3 weeks. Ask your health care provider when it is safe to start showering.  Do not douche, use tampons, or have sexual intercourse for at least 6 weeks or until your health care provider says you can.   Follow your health care provider's advice about exercise, lifting, driving, and general activities.  Get plenty of rest and sleep.   Do not lift anything heavier than a gallon of milk (about 10 lb [4.5 kg]) for the first month after surgery.  You can resume your normal diet if your health care provider says it is okay.   Do not drink alcohol until your health care provider says you can.   If you are constipated, ask your health care provider if you can take a mild laxative.  Eating foods high in fiber may also help with constipation. Eat plenty of raw fruits and vegetables, whole grains, and  beans.  Drink enough fluids to keep your urine clear or pale yellow.   Try to have someone at home with you for the first 1-2 weeks to help around the house.  Keep all follow-up appointments. SEEK MEDICAL CARE IF:   You have chills or fever.  You have swelling, redness, or pain in the area of your incision that is getting worse.   You have pus coming from the incision.   You notice a bad smell coming from the incision or bandage.   Your incision breaks open.   You feel dizzy or light-headed.   You have pain or bleeding when you urinate.   You have persistent diarrhea.   You have persistent nausea and vomiting.   You have abnormal vaginal discharge.   You have a rash.   You have any type of abnormal reaction or develop an allergy to your medicine.   Your pain medicine is not helping.  SEEK IMMEDIATE MEDICAL CARE IF:   You have a fever and your symptoms suddenly get worse.  You have severe abdominal pain.  You have chest pain.  You have shortness of breath.  You faint.  You have pain, swelling, or redness of your leg.  You have heavy vaginal bleeding with blood clots. MAKE SURE YOU:  Understand these instructions.  Will watch your condition.  Will get help right away if you are not doing well or get worse. Document Released: 10/11/2004 Document Revised: 03/29/2013 Document Reviewed: 01/14/2013 ExitCare Patient Information 2015 [employer]. This information is not intended   to replace advice given to you by your health care provider. Make sure you discuss any questions you have with your health care provider.  

## 2014-09-08 LAB — TYPE AND SCREEN
ABO/RH(D): O POS
Antibody Screen: NEGATIVE
UNIT DIVISION: 0
Unit division: 0

## 2014-09-08 LAB — GLUCOSE, CAPILLARY: GLUCOSE-CAPILLARY: 128 mg/dL — AB (ref 65–99)

## 2014-09-11 ENCOUNTER — Encounter (HOSPITAL_BASED_OUTPATIENT_CLINIC_OR_DEPARTMENT_OTHER): Payer: Medicare Other

## 2014-09-12 ENCOUNTER — Encounter (HOSPITAL_BASED_OUTPATIENT_CLINIC_OR_DEPARTMENT_OTHER): Payer: Medicare Other

## 2014-09-13 ENCOUNTER — Ambulatory Visit: Payer: Medicare Other | Admitting: Obstetrics & Gynecology

## 2014-09-13 ENCOUNTER — Encounter: Payer: Self-pay | Admitting: Obstetrics & Gynecology

## 2014-09-13 VITALS — BP 142/92 | HR 114 | Temp 98.9°F | Wt 360.7 lb

## 2014-09-13 DIAGNOSIS — Z9889 Other specified postprocedural states: Secondary | ICD-10-CM

## 2014-09-13 NOTE — Patient Instructions (Signed)
Abdominal Hysterectomy, Care After Refer to this sheet in the next few weeks. These instructions provide you with information on caring for yourself after your procedure. Your health care provider may also give you more specific instructions. Your treatment has been planned according to current medical practices, but problems sometimes occur. Call your health care provider if you have any problems or questions after your procedure.  WHAT TO EXPECT AFTER THE PROCEDURE After your procedure, it is typical to have the following:  Pain.  Feeling tired.  Poor appetite.  Less interest in sex. HOME CARE INSTRUCTIONS  It takes 4-6 weeks to recover from this surgery. Make sure you follow all your health care provider's instructions. Home care instructions may include:  Take pain medicines only as directed by your health care provider. Do not take over-the-counter pain medicines without checking with your health care provider first.  Change your bandage as directed by your health care provider.  Return to your health care provider to have your sutures taken out.  Take showers instead of baths for 2-3 weeks. Ask your health care provider when it is safe to start showering.  Do not douche, use tampons, or have sexual intercourse for at least 6 weeks or until your health care provider says you can.   Follow your health care provider's advice about exercise, lifting, driving, and general activities.  Get plenty of rest and sleep.   Do not lift anything heavier than a gallon of milk (about 10 lb [4.5 kg]) for the first month after surgery.  You can resume your normal diet if your health care provider says it is okay.   Do not drink alcohol until your health care provider says you can.   If you are constipated, ask your health care provider if you can take a mild laxative.  Eating foods high in fiber may also help with constipation. Eat plenty of raw fruits and vegetables, whole grains, and  beans.  Drink enough fluids to keep your urine clear or pale yellow.   Try to have someone at home with you for the first 1-2 weeks to help around the house.  Keep all follow-up appointments. SEEK MEDICAL CARE IF:   You have chills or fever.  You have swelling, redness, or pain in the area of your incision that is getting worse.   You have pus coming from the incision.   You notice a bad smell coming from the incision or bandage.   Your incision breaks open.   You feel dizzy or light-headed.   You have pain or bleeding when you urinate.   You have persistent diarrhea.   You have persistent nausea and vomiting.   You have abnormal vaginal discharge.   You have a rash.   You have any type of abnormal reaction or develop an allergy to your medicine.   Your pain medicine is not helping.  SEEK IMMEDIATE MEDICAL CARE IF:   You have a fever and your symptoms suddenly get worse.  You have severe abdominal pain.  You have chest pain.  You have shortness of breath.  You faint.  You have pain, swelling, or redness of your leg.  You have heavy vaginal bleeding with blood clots. MAKE SURE YOU:  Understand these instructions.  Will watch your condition.  Will get help right away if you are not doing well or get worse. Document Released: 10/11/2004 Document Revised: 03/29/2013 Document Reviewed: 01/14/2013 ExitCare Patient Information 2015 [employer]. This information is not intended   to replace advice given to you by your health care provider. Make sure you discuss any questions you have with your health care provider.  

## 2014-09-13 NOTE — Progress Notes (Signed)
Patient ID: Angelica Ortiz, female   DOB: 10/27/1965, 49 y.o.   MRN: [id number] Pt with no complaints. Staples removed and steristrips applied by nursing. Pt has 2 weeks post op check next week.  Angelica Ortiz, M.D., Evern CoreFACOG

## 2014-09-13 NOTE — Progress Notes (Signed)
Patient ID: Angelica Ortiz, female   DOB: 11/16/1965, 49 y.o.   MRN: [id number]  Patient here today for PICO drain and staple removal. PICO drain/dressing removed. Upon inspection, wound clean and dry. No redness, swelling, or drainage noted. Patient afebrile. 1 inch mid section of incision slightly open with pink tissue visible. Staples removed, edges approximated and steri strips placed. Educated patient on care of site-- advised she allow soapy shower water to run down over incision, indirectly, and pat dry. Informed her steri strips will fall off on their own and that she should not pull them off. Educated on s/s infection and advised she call with any problems. Patient and husband verbalize understanding. No questions or concerns.

## 2014-09-19 ENCOUNTER — Ambulatory Visit: Payer: Medicare Other | Admitting: Obstetrics & Gynecology

## 2014-09-25 ENCOUNTER — Encounter: Payer: Self-pay | Admitting: Obstetrics & Gynecology

## 2014-09-25 ENCOUNTER — Ambulatory Visit (INDEPENDENT_AMBULATORY_CARE_PROVIDER_SITE_OTHER): Payer: Medicare Other | Admitting: Obstetrics & Gynecology

## 2014-09-25 VITALS — BP 144/90 | HR 100 | Temp 98.5°F | Ht 66.0 in | Wt 354.3 lb

## 2014-09-25 DIAGNOSIS — K529 Noninfective gastroenteritis and colitis, unspecified: Secondary | ICD-10-CM

## 2014-09-25 DIAGNOSIS — Z9889 Other specified postprocedural states: Secondary | ICD-10-CM

## 2014-09-25 DIAGNOSIS — R197 Diarrhea, unspecified: Secondary | ICD-10-CM

## 2014-09-25 NOTE — Progress Notes (Signed)
Patient ID: Angelica Ortiz, female   DOB: 11-Aug-1965, 49 y.o.   MRN: [id number] History:  49 y.o. [id number] here today for 3 week post op check.  Pt reports diarrhea >5 stools per day and watery.  She has not increased her pre surgery pain meds.  She denies Nausea.  She is voiding without difficulty.  Pt has stopped taking Colace.  The following portions of the patient's history were reviewed and updated as appropriate: allergies, current medications, past family history, past medical history, past social history, past surgical history and problem list.  Review of Systems:  Pertinent items are noted in HPI.  Objective:  Physical Exam Blood pressure 144/90, pulse 100, temperature 98.5 F (36.9 C), temperature source Oral, height 5\' 6"  (1.676 m), weight 354 lb 4.8 oz (160.709 kg). Gen: NAD Abd: Soft, nontender and nondistended.  Incision clean- slight erythema on skin   Pelvic: not done  Labs and Imaging 09/05/2014 Surg Path Diagnosis Uterus, ovaries and fallopian tubes - ENDOMETRIUM: BENIGN SECRETORY TYPE ENDOMETRIUM. - MYOMETRIUM: LEIOMYOMATOUS CHANGE. - SEROSA: FOCAL DISRUPTION. - RIGHT ADNEXA: BENIGN OVARY AND FALLOPIAN TUBE. - LEFT ADNEXA: BENIGN OVARIAN PARENCHYMA WITH ASSOCIATED HEMORRHAGE.   Assessment & Plan:  C.diff cx- pt to bring in sample F/u 3 weeks for post op check Pt with elevated BP.  Will f/u with primary care physician Gradual return to full activity Keep incision clean and dry

## 2014-09-25 NOTE — Patient Instructions (Signed)
Abdominal Hysterectomy, Care After Refer to this sheet in the next few weeks. These instructions provide you with information on caring for yourself after your procedure. Your health care provider may also give you more specific instructions. Your treatment has been planned according to current medical practices, but problems sometimes occur. Call your health care provider if you have any problems or questions after your procedure.  WHAT TO EXPECT AFTER THE PROCEDURE After your procedure, it is typical to have the following:  Pain.  Feeling tired.  Poor appetite.  Less interest in sex. HOME CARE INSTRUCTIONS  It takes 4-6 weeks to recover from this surgery. Make sure you follow all your health care provider's instructions. Home care instructions may include:  Take pain medicines only as directed by your health care provider. Do not take over-the-counter pain medicines without checking with your health care provider first.  Change your bandage as directed by your health care provider.  Return to your health care provider to have your sutures taken out.  Take showers instead of baths for 2-3 weeks. Ask your health care provider when it is safe to start showering.  Do not douche, use tampons, or have sexual intercourse for at least 6 weeks or until your health care provider says you can.   Follow your health care provider's advice about exercise, lifting, driving, and general activities.  Get plenty of rest and sleep.   Do not lift anything heavier than a gallon of milk (about 10 lb [4.5 kg]) for the first month after surgery.  You can resume your normal diet if your health care provider says it is okay.   Do not drink alcohol until your health care provider says you can.   If you are constipated, ask your health care provider if you can take a mild laxative.  Eating foods high in fiber may also help with constipation. Eat plenty of raw fruits and vegetables, whole grains, and  beans.  Drink enough fluids to keep your urine clear or pale yellow.   Try to have someone at home with you for the first 1-2 weeks to help around the house.  Keep all follow-up appointments. SEEK MEDICAL CARE IF:   You have chills or fever.  You have swelling, redness, or pain in the area of your incision that is getting worse.   You have pus coming from the incision.   You notice a bad smell coming from the incision or bandage.   Your incision breaks open.   You feel dizzy or light-headed.   You have pain or bleeding when you urinate.   You have persistent diarrhea.   You have persistent nausea and vomiting.   You have abnormal vaginal discharge.   You have a rash.   You have any type of abnormal reaction or develop an allergy to your medicine.   Your pain medicine is not helping.  SEEK IMMEDIATE MEDICAL CARE IF:   You have a fever and your symptoms suddenly get worse.  You have severe abdominal pain.  You have chest pain.  You have shortness of breath.  You faint.  You have pain, swelling, or redness of your leg.  You have heavy vaginal bleeding with blood clots. MAKE SURE YOU:  Understand these instructions.  Will watch your condition.  Will get help right away if you are not doing well or get worse. Document Released: 10/11/2004 Document Revised: 03/29/2013 Document Reviewed: 01/14/2013 ExitCare Patient Information 2015 [employer]. This information is not intended   to replace advice given to you by your health care provider. Make sure you discuss any questions you have with your health care provider.  

## 2014-10-03 ENCOUNTER — Telehealth: Payer: Self-pay | Admitting: General Practice

## 2014-10-03 NOTE — Telephone Encounter (Signed)
Patient called and left message stating she found a lump in her left breast and needs direction from Dr Erin FullingHarraway Smith about what she needs to do. Called patient stating I am returning your phone call and discussed with patient that Dr Erin FullingHarraway Smith will to an exam and follow up on this at her next appt on 7/15. Patient verbalized understanding and states she is seeing her PCP on 7/13 and wonders if she should have them check it out or wait until she sees us. Told her that is up to her but the 2 days difference doesn't really matter. Patient verbalized understanding and had no other questions

## 2014-10-12 ENCOUNTER — Other Ambulatory Visit: Payer: Self-pay | Admitting: Surgery

## 2014-10-20 ENCOUNTER — Ambulatory Visit: Payer: Medicare Other | Admitting: Obstetrics & Gynecology

## 2014-10-23 ENCOUNTER — Encounter: Payer: Self-pay | Admitting: Obstetrics & Gynecology

## 2014-10-23 ENCOUNTER — Other Ambulatory Visit: Payer: Self-pay | Admitting: General Practice

## 2014-10-23 ENCOUNTER — Ambulatory Visit: Payer: Medicare Other | Admitting: Obstetrics & Gynecology

## 2014-10-23 VITALS — BP 156/81 | HR 112 | Temp 98.6°F | Ht 66.0 in | Wt 358.2 lb

## 2014-10-23 DIAGNOSIS — N6325 Unspecified lump in the left breast, overlapping quadrants: Secondary | ICD-10-CM

## 2014-10-23 DIAGNOSIS — N6320 Unspecified lump in the left breast, unspecified quadrant: Principal | ICD-10-CM

## 2014-10-23 NOTE — Progress Notes (Signed)
Patient ID: Angelica Ortiz, female   DOB: 11/26/1965, 49 y.o.   MRN: [id number] History:  49 y.o. [id number] here today for 6 weeks post op check. She reports that she is doing well.  She has had no bleeding but, she does report hot flushes.  She does not want meds for them at this time. She denies bleeding   The following portions of the patient's history were reviewed and updated as appropriate: allergies, current medications, past family history, past medical history, past social history, past surgical history and problem list.  Review of Systems:  Pertinent items are noted in HPI.  Objective:  Physical Exam Blood pressure 156/81, pulse 112, temperature 98.6 F (37 C), temperature source Oral, height 5\' 6"  (1.676 m), weight 358 lb 3.2 oz (162.478 kg). Gen: NAD Abd: Obese; Soft, nontender and nondistended.  Well healed transverse incision. Pelvic: Normal appearing external genitalia; normal appearing vaginal mucosa and cervix.  Normal discharge.  Small uterus, no other palpable masses, no uterine or adnexal tenderness  Labs and Imaging No results found.  Assessment & Plan:  Elevated BP 6 week post op check following supracervical abdominal hyst- doing well  Pt to f/u with primary care for further eval Pt to be scheduled for Obesity schedule in Sept F/u in 3 month or sooner prn Discussed non medical treatment of vasomotor sx  Angelica Ortiz, M.D., Evern CoreFACOG

## 2014-10-25 ENCOUNTER — Encounter: Payer: Self-pay | Admitting: Obstetrics & Gynecology

## 2014-10-26 ENCOUNTER — Ambulatory Visit
Admission: RE | Admit: 2014-10-26 | Discharge: 2014-10-26 | Disposition: A | Discharge status: Home / Self Care | Payer: Medicare Other | Source: Ambulatory Visit | Attending: Obstetrics & Gynecology | Admitting: Obstetrics & Gynecology

## 2014-10-26 DIAGNOSIS — N6320 Unspecified lump in the left breast, unspecified quadrant: Principal | ICD-10-CM

## 2014-10-26 DIAGNOSIS — N6325 Unspecified lump in the left breast, overlapping quadrants: Secondary | ICD-10-CM

## 2014-10-30 ENCOUNTER — Encounter: Payer: Medicare Other | Attending: Surgery | Admitting: Dietician

## 2014-10-30 ENCOUNTER — Encounter: Payer: Self-pay | Admitting: Dietician

## 2014-10-30 VITALS — Ht 66.0 in | Wt 366.7 lb

## 2014-10-30 DIAGNOSIS — Z713 Dietary counseling and surveillance: Secondary | ICD-10-CM | POA: Diagnosis not present

## 2014-10-30 DIAGNOSIS — Z01818 Encounter for other preprocedural examination: Secondary | ICD-10-CM | POA: Diagnosis not present

## 2014-10-30 DIAGNOSIS — Z6841 Body Mass Index (BMI) 40.0 and over, adult: Secondary | ICD-10-CM | POA: Insufficient documentation

## 2014-10-30 NOTE — Progress Notes (Signed)
  Pre-Op Assessment Visit:  Pre-Operative RYGB Surgery  Medical Nutrition Therapy:  Appt start time: 400   End time:  500.  Patient was seen on 10/30/2014 for Pre-Operative Nutrition Assessment. Assessment and letter of approval faxed to [facility] Bariatric Surgery Program coordinator on 10/30/2014.   Preferred Learning Style:   No preference indicated   Learning Readiness:   Ready  Handouts given during visit include:  Pre-Op Goals Bariatric Surgery Protein Shakes   During the appointment today the following Pre-Op Goals were reviewed with the patient: Maintain or lose weight as instructed by your surgeon Make healthy food choices Begin to limit portion sizes Limited concentrated sugars and fried foods Keep fat/sugar in the single digits per serving on   food labels Practice CHEWING your food  (aim for 30 chews per bite or until applesauce consistency) Practice not drinking 15 minutes before, during, and 30 minutes after each meal/snack Avoid all carbonated beverages  Avoid/limit caffeinated beverages  Avoid all sugar-sweetened beverages Consume 3 meals per day; eat every 3-5 hours Make a list of non-food related activities Aim for 64-100 ounces of FLUID daily  Aim for at least 60-80 grams of PROTEIN daily Look for a liquid protein source that contain ?15 g protein and ?5 g carbohydrate  (ex: shakes, drinks, shots)  Patient-Centered Goals: -Reduced pain in joints -Resolution of medical conditions -Walking and rides at amusement parks -More active -Shopping at stores without plus sizes -Inspiring and a good example for kids  Demonstrated degree of understanding via:  Teach Back  Teaching Method Utilized:  Visual Auditory Hands on  Barriers to learning/adherence to lifestyle change: physical limitations  Patient to call the [facility] to enroll in Pre-Op and Post-Op Nutrition Education when surgery date is scheduled.

## 2014-11-09 ENCOUNTER — Ambulatory Visit: Payer: Medicare Other | Admitting: Dietician

## 2014-11-21 ENCOUNTER — Ambulatory Visit (HOSPITAL_BASED_OUTPATIENT_CLINIC_OR_DEPARTMENT_OTHER): Payer: Medicare Other | Attending: Anesthesiology

## 2014-11-21 DIAGNOSIS — R0683 Snoring: Secondary | ICD-10-CM | POA: Diagnosis not present

## 2014-11-21 DIAGNOSIS — G4730 Sleep apnea, unspecified: Secondary | ICD-10-CM | POA: Diagnosis present

## 2014-11-21 DIAGNOSIS — G4733 Obstructive sleep apnea (adult) (pediatric): Secondary | ICD-10-CM | POA: Diagnosis not present

## 2014-11-24 ENCOUNTER — Ambulatory Visit (HOSPITAL_COMMUNITY)
Admission: RE | Admit: 2014-11-24 | Discharge: 2014-11-24 | Disposition: A | Discharge status: Home / Self Care | Payer: Medicare Other | Source: Ambulatory Visit | Attending: Surgery | Admitting: Surgery

## 2014-11-24 DIAGNOSIS — Z6841 Body Mass Index (BMI) 40.0 and over, adult: Secondary | ICD-10-CM | POA: Insufficient documentation

## 2014-11-24 DIAGNOSIS — K219 Gastro-esophageal reflux disease without esophagitis: Secondary | ICD-10-CM | POA: Insufficient documentation

## 2014-11-24 DIAGNOSIS — J45909 Unspecified asthma, uncomplicated: Secondary | ICD-10-CM | POA: Diagnosis not present

## 2014-11-24 DIAGNOSIS — Z9049 Acquired absence of other specified parts of digestive tract: Secondary | ICD-10-CM | POA: Insufficient documentation

## 2014-11-24 DIAGNOSIS — M797 Fibromyalgia: Secondary | ICD-10-CM | POA: Diagnosis not present

## 2014-11-24 DIAGNOSIS — E119 Type 2 diabetes mellitus without complications: Secondary | ICD-10-CM | POA: Diagnosis not present

## 2014-11-24 DIAGNOSIS — I10 Essential (primary) hypertension: Secondary | ICD-10-CM | POA: Diagnosis not present

## 2014-11-25 ENCOUNTER — Encounter (HOSPITAL_BASED_OUTPATIENT_CLINIC_OR_DEPARTMENT_OTHER): Payer: Medicare Other | Admitting: Internal Medicine

## 2014-11-25 DIAGNOSIS — G4733 Obstructive sleep apnea (adult) (pediatric): Secondary | ICD-10-CM | POA: Diagnosis not present

## 2014-11-25 NOTE — Progress Notes (Signed)
  Patient Name: Angelica Ortiz, Angelica Ortiz Date: 11/21/2014 Gender: Female D.O.B: [birth year] Age (years): 49 Referring Provider: Blenda Bridegroom Dakwa Height (inches): 66 Interpreting Physician: Jetty Duhamel MD, ABSM Weight (lbs): 354 RPSGT: Melburn Popper BMI: 57 MRN: [id number] Neck Size: 19.00 CLINICAL INFORMATION The patient is referred for a BiPAP titration to treat sleep apnea.     Date of  Baseline NPSG, Split Night or HST:  NPSG 06/07/13  AHI 64.2/ hr,  body weight 354 lbs  SLEEP STUDY TECHNIQUE As per the AASM Manual for the Scoring of Sleep and Associated Events v2.3 (April 2016) with a hypopnea requiring 4% desaturations.  The channels recorded and monitored were frontal, central and occipital EEG, electrooculogram (EOG), submentalis EMG (chin), nasal and oral airflow, thoracic and abdominal wall motion, anterior tibialis EMG, snore microphone, electrocardiogram, and pulse oximetry. Bilevel positive airway pressure (BPAP) was initiated at the beginning of the study and titrated to treat sleep-disordered breathing.  MEDICATIONS Medications administered by patient during sleep study : No sleep medicine administered.  RESPIRATORY PARAMETERS Optimal IPAP Pressure (cm): 14 AHI at Optimal Pressure (/hr) 9.4 Optimal EPAP Pressure (cm): 10   Overall Minimal O2 (%): 83.00 Minimal O2 at Optimal Pressure (%): 89.0  SLEEP ARCHITECTURE Start Time: 11:30:15 PM Stop Time: 5:38:23 AM Total Time (min): 368.1 Total Sleep Time (min): 335.7 Sleep Latency (min): 12.4 Sleep Efficiency (%): 91.2 REM Latency (min): 77.5 WASO (min): 20.0 Stage N1 (%): 2.08 Stage N2 (%): 55.17 Stage N3 (%): 1.04 Stage R (%): 41.70 Supine (%): 69.77 Arousal Index (/hr): 5.2      CARDIAC DATA The 2 lead EKG demonstrated sinus rhythm. The mean heart rate was 95.18 beats per minute. Other EKG findings include: None.  LEG MOVEMENT DATA The total Periodic Limb Movements of Sleep (PLMS) were 37. The PLMS index was 6.61. A PLMS  index of <15 is considered normal in adults.  IMPRESSIONS The patient did not tolerate CPAP pressures and required titration with BIPAP. An optimal PAP pressure was selected for this patient ( Inspiratory 14cm of water,  Expiratory 10  cm of water) Central sleep apnea was not noted during this titration (CAI = 0.7/h). Moderate oxygen desaturations were observed during this titration (min O2 = 83.00%). The patient snored with Moderate snoring volume. No cardiac abnormalities were observed during this study. Mild periodic limb movements were observed during this study. Arousals associated with PLMs were rare.  DIAGNOSIS Obstructive Sleep Apnea (327.23 [G47.33 ICD-10])  RECOMMENDATIONS Trial of BiPAP therapy on 14/10 cm H2O with a Medium size Resmed Nasal Pillow Mask AirFit P10 mask and heated humidification. Avoid alcohol, sedatives and other CNS depressants that may worsen sleep apnea and disrupt normal sleep architecture. Sleep hygiene should be reviewed to assess factors that may improve sleep quality. Weight management and regular exercise should be initiated or continued.    Waymon Budge Diplomate, American Board of Sleep Medicine  ELECTRONICALLY SIGNED ON:  11/25/2014, 1:09 PM [facility] PH: [phone]   FX: [phone] ACCREDITED BY THE AMERICAN ACADEMY OF SLEEP MEDICINE

## 2014-12-05 ENCOUNTER — Ambulatory Visit (INDEPENDENT_AMBULATORY_CARE_PROVIDER_SITE_OTHER): Payer: Medicare Other | Admitting: Psychiatry

## 2014-12-12 ENCOUNTER — Ambulatory Visit (INDEPENDENT_AMBULATORY_CARE_PROVIDER_SITE_OTHER): Payer: Medicare Other | Admitting: Psychiatry

## 2015-01-01 ENCOUNTER — Encounter: Payer: Medicare Other | Attending: Surgery

## 2015-01-01 VITALS — Ht 66.0 in | Wt 361.4 lb

## 2015-01-01 DIAGNOSIS — Z6841 Body Mass Index (BMI) 40.0 and over, adult: Secondary | ICD-10-CM | POA: Diagnosis not present

## 2015-01-01 DIAGNOSIS — Z01818 Encounter for other preprocedural examination: Secondary | ICD-10-CM | POA: Insufficient documentation

## 2015-01-01 DIAGNOSIS — Z713 Dietary counseling and surveillance: Secondary | ICD-10-CM | POA: Diagnosis not present

## 2015-01-03 NOTE — Progress Notes (Signed)
  Pre-Operative Nutrition Class:  Appt start time: 4830   End time:  1830.  Patient was seen on 01/01/15 for Pre-Operative Bariatric Surgery [facility].   Surgery date: 01/23/15 Surgery type: RYGB Start weight at [facility]: 367 lbs on 10/30/14 Weight today: 361.4 lbs  TANITA  BODY COMP RESULTS  01/01/15   BMI (kg/m^2) N/A   Fat Mass (lbs)    Fat Free Mass (lbs)    Total Body Water (lbs)     The following the learning objectives were met by the patient during this course:  Identify Pre-Op Dietary Goals and will begin 2 weeks pre-operatively  Identify appropriate sources of fluids and proteins   State protein recommendations and appropriate sources pre and post-operatively  Identify Post-Operative Dietary Goals and will follow for 2 weeks post-operatively  Identify appropriate multivitamin and calcium sources  Describe the need for physical activity post-operatively and will follow MD recommendations  State when to call healthcare provider regarding medication questions or post-operative complications  Handouts given during class include:  Pre-Op Bariatric Surgery Diet Handout  Protein Shake Handout  Post-Op Bariatric Surgery Nutrition Handout  BELT Program Information Flyer  Support Group Information Flyer  [facility] Bariatric Supplements Price List  Follow-Up Plan: Patient will follow-up at [facility] 2 weeks post operatively for diet advancement per MD.

## 2015-01-05 NOTE — Progress Notes (Signed)
Please put orders in Epic surgery 01-23-15 pre op 01-16-15 Thanks

## 2015-01-16 ENCOUNTER — Inpatient Hospital Stay (HOSPITAL_COMMUNITY): Admission: RE | Admit: 2015-01-16 | Payer: Self-pay | Source: Ambulatory Visit

## 2015-01-23 ENCOUNTER — Encounter (HOSPITAL_COMMUNITY): Admission: RE | Payer: Self-pay | Source: Ambulatory Visit

## 2015-01-23 ENCOUNTER — Inpatient Hospital Stay (HOSPITAL_COMMUNITY): Admission: RE | Admit: 2015-01-23 | Payer: Medicare Other | Source: Ambulatory Visit | Admitting: Surgery

## 2015-01-23 SURGERY — LAPAROSCOPIC ROUX-EN-Y GASTRIC BYPASS WITH UPPER ENDOSCOPY
Anesthesia: General

## 2015-02-06 ENCOUNTER — Ambulatory Visit: Payer: Self-pay

## 2015-02-08 ENCOUNTER — Encounter: Payer: Self-pay | Admitting: *Deleted

## 2015-02-28 ENCOUNTER — Other Ambulatory Visit: Payer: Self-pay | Admitting: Surgery

## 2015-03-09 ENCOUNTER — Encounter (HOSPITAL_COMMUNITY)
Admission: RE | Admit: 2015-03-09 | Discharge: 2015-03-09 | Disposition: A | Discharge status: Home / Self Care | Payer: Medicare Other | Source: Ambulatory Visit | Attending: Surgery | Admitting: Surgery

## 2015-03-09 ENCOUNTER — Encounter (HOSPITAL_COMMUNITY): Payer: Self-pay

## 2015-03-09 DIAGNOSIS — E119 Type 2 diabetes mellitus without complications: Secondary | ICD-10-CM | POA: Insufficient documentation

## 2015-03-09 DIAGNOSIS — Z01818 Encounter for other preprocedural examination: Secondary | ICD-10-CM | POA: Insufficient documentation

## 2015-03-09 HISTORY — DX: Repeated falls: R29.6

## 2015-03-09 HISTORY — DX: Unspecified urinary incontinence: R32

## 2015-03-09 HISTORY — DX: Personal history of urinary (tract) infections: Z87.440

## 2015-03-09 HISTORY — DX: Unsteadiness on feet: R26.81

## 2015-03-09 HISTORY — DX: Unspecified fall, initial encounter: W19.XXXA

## 2015-03-09 LAB — COMPREHENSIVE METABOLIC PANEL
ALBUMIN: 3.8 g/dL (ref 3.5–5.0)
ALT: 18 U/L (ref 14–54)
ANION GAP: 11 (ref 5–15)
AST: 19 U/L (ref 15–41)
Alkaline Phosphatase: 65 U/L (ref 38–126)
BILIRUBIN TOTAL: 0.3 mg/dL (ref 0.3–1.2)
BUN: 20 mg/dL (ref 6–20)
CO2: 27 mmol/L (ref 22–32)
Calcium: 9.5 mg/dL (ref 8.9–10.3)
Chloride: 103 mmol/L (ref 101–111)
Creatinine, Ser: 0.79 mg/dL (ref 0.44–1.00)
GFR calc Af Amer: >60 mL/min (ref >60)
GFR calc non Af Amer: >60 mL/min (ref >60)
GLUCOSE: 89 mg/dL (ref 65–99)
POTASSIUM: 3.5 mmol/L (ref 3.5–5.1)
Sodium: 141 mmol/L (ref 135–145)
TOTAL PROTEIN: 8 g/dL (ref 6.5–8.1)

## 2015-03-09 LAB — CBC WITH DIFFERENTIAL/PLATELET
BASOS PCT: 0 %
Basophils Absolute: 0 10*3/uL (ref 0.0–0.1)
EOS ABS: 0.1 10*3/uL (ref 0.0–0.7)
EOS PCT: 2 %
HEMATOCRIT: 35.3 % — AB (ref 36.0–46.0)
Hemoglobin: 11.2 g/dL — ABNORMAL LOW (ref 12.0–15.0)
Lymphocytes Relative: 32 %
Lymphs Abs: 2.4 10*3/uL (ref 0.7–4.0)
MCH: 22.6 pg — ABNORMAL LOW (ref 26.0–34.0)
MCHC: 31.7 g/dL (ref 30.0–36.0)
MCV: 71.2 fL — ABNORMAL LOW (ref 78.0–100.0)
MONO ABS: 0.6 10*3/uL (ref 0.1–1.0)
MONOS PCT: 8 %
NEUTROS ABS: 4.3 10*3/uL (ref 1.7–7.7)
Neutrophils Relative %: 58 %
Platelets: 319 10*3/uL (ref 150–400)
RBC: 4.96 MIL/uL (ref 3.87–5.11)
RDW: 20.9 % — AB (ref 11.5–15.5)
WBC: 7.3 10*3/uL (ref 4.0–10.5)

## 2015-03-09 NOTE — Progress Notes (Signed)
Spoke with Dr Massagee/anesthesia in regards to pts H&P. Also received clarification with pt taking Suboxone day of surgery. Pt to take prior to arrival to Short Stay. Anesthesia to see pt day of surgery. EKG/epic 11/24/2014 CXR epic 06/28/2014

## 2015-03-09 NOTE — Patient Instructions (Addendum)
Angelica Serita ShellerM Rivera  03/09/2015   Your procedure is scheduled on: Tuesday March 13, 2015   Report to [facility]  Entrance take [address]  elevators to 3rd floor to  Short Stay Center at 5:15 AM.  Call this number if you have problems the morning of surgery [phone]   Remember: ONLY 1 PERSON MAY GO WITH YOU TO SHORT STAY TO GET  READY MORNING OF YOUR SURGERY.  Do not eat food or drink liquids :After Midnight.     Take these medicines the morning of surgery with A SIP OF WATER: Famotidine (Pepcid); Lyrica; Suboxone; May use albuterol inhaler if needed; Symbicort inhaler (bring inhalers with you day of surgery); May use flonase if needed (bring with you day of surgery DO NOT TAKE ANY DIABETIC MEDICATIONS DAY OF YOUR SURGERY                               You may not have any metal on your body including hair pins and              piercings  Do not wear jewelry, make-up, lotions, powders or perfumes, deodorant             Do not wear nail polish.  Do not shave  48 hours prior to surgery.              Do not bring valuables to the hospital. [facility] IS NOT             RESPONSIBLE   FOR VALUABLES.  Contacts, dentures or bridgework may not be worn into surgery.  Leave suitcase in the car. After surgery it may be brought to your room.   FOLLOW MD INSTRUCTION IN REGARDS TO BOWEL PREPARATION PRIOR TO SURGICAL DATE            _____________________________________________________________________             [facility] - Preparing for Surgery Before surgery, you can play an important role.  Because skin is not sterile, your skin needs to be as free of germs as possible.  You can reduce the number of germs on your skin by washing with CHG (chlorahexidine gluconate) soap before surgery.  CHG is an antiseptic cleaner which kills germs and bonds with the skin to continue killing germs even after washing. Please DO NOT use if you have an allergy to CHG or antibacterial soaps.   If your skin becomes reddened/irritated stop using the CHG and inform your nurse when you arrive at Short Stay. Do not shave (including legs and underarms) for at least 48 hours prior to the first CHG shower.  You may shave your face/neck. Please follow these instructions carefully:  1.  Shower with CHG Soap the night before surgery and the  morning of Surgery.  2.  If you choose to wash your hair, wash your hair first as usual with your  normal  shampoo.  3.  After you shampoo, rinse your hair and body thoroughly to remove the  shampoo.                           4.  Use CHG as you would any other liquid soap.  You can apply chg directly  to the skin and wash  Gently with a scrungie or clean washcloth.  5.  Apply the CHG Soap to your body ONLY FROM THE NECK DOWN.   Do not use on face/ open                           Wound or open sores. Avoid contact with eyes, ears mouth and genitals (private parts).                       Wash face,  Genitals (private parts) with your normal soap.             6.  Wash thoroughly, paying special attention to the area where your surgery  will be performed.  7.  Thoroughly rinse your body with warm water from the neck down.  8.  DO NOT shower/wash with your normal soap after using and rinsing off  the CHG Soap.                9.  Pat yourself dry with a clean towel.            10.  Wear clean pajamas.            11.  Place clean sheets on your bed the night of your first shower and do not  sleep with pets. Day of Surgery : Do not apply any lotions/deodorants the morning of surgery.  Please wear clean clothes to the hospital/surgery center.  FAILURE TO FOLLOW THESE INSTRUCTIONS MAY RESULT IN THE CANCELLATION OF YOUR SURGERY PATIENT SIGNATURE_________________________________  NURSE SIGNATURE__________________________________  ________________________________________________________________________

## 2015-03-10 LAB — HEMOGLOBIN A1C
Hgb A1c MFr Bld: 5.9 % — ABNORMAL HIGH (ref 4.8–5.6)
Mean Plasma Glucose: 123 mg/dL

## 2015-03-11 NOTE — Anesthesia Preprocedure Evaluation (Addendum)
Anesthesia Evaluation  Patient identified by MRN, date of birth, ID band Patient awake    Reviewed: Allergy & Precautions, NPO status , Patient's Chart, lab work & pertinent test results, reviewed documented beta blocker date and time   Airway Mallampati: II   Neck ROM: Full    Dental  (+) Dental Advisory Given, Teeth Intact   Pulmonary asthma , sleep apnea and Continuous Positive Airway Pressure Ventilation ,    breath sounds clear to auscultation       Cardiovascular hypertension, Pt. on medications  Rhythm:Regular  Has had ECHO and STRESS both of which were not indicative ischemia indications   Neuro/Psych  Headaches,    GI/Hepatic GERD  Medicated,  Endo/Other  diabetes, Type 2Morbid obesity  Renal/GU      Musculoskeletal  (+) Arthritis ,   Abdominal (+) + obese,   Peds  Hematology 11/35,   Anesthesia Other Findings   Reproductive/Obstetrics                           Anesthesia Physical  Anesthesia Plan  ASA: IV  Anesthesia Plan: General   Post-op Pain Management:    Induction: Intravenous  Airway Management Planned: Oral ETT and Video Laryngoscope Planned  Additional Equipment:   Intra-op Plan:   Post-operative Plan: Extubation in OR  Informed Consent: I have reviewed the patients History and Physical, chart, labs and discussed the procedure including the risks, benefits and alternatives for the proposed anesthesia with the patient or authorized representative who has indicated his/her understanding and acceptance.     Plan Discussed with:   Anesthesia Plan Comments: (Multimodal pain rx,)        Anesthesia Quick Evaluation

## 2015-03-12 NOTE — H&P (Signed)
Angelica Ortiz  Location: [facility] Patient #: [id number] DOB: 1966/02/26 Married / Language: English / Race: Black or African American Female  History of Present Illness   Patient words: baritric.   The patient is a 49 year old female who presents for a bariatric surgery evaluation.   Her PCP is Juluis Mire at [facility].  She is here by herself.   She is in good spirits. She is at 343 pounds today, so she is within range to schedule her surgery. So I think that we can go ahead with surgery. She has worked hard at the weight loss. I again reviewed the surgery with her. I think the one unknown is that she had the prior open gall bladder surgery - and how much in the way of adhesions I will see. I have added 1 extra hour to the surgery for this. I have given her instructions for pre op bowel prep, a prescription for her post op pain meds. We went over the hospital course and post op diet.  Her UGI on 11/24/2014 was negative. She saw Dr. Kyra Leyland for psych - 12/05/2014  History of weight issues:  She went to one of our information sessions on bariatric surgery. She is interested in the gastric bypass. She moved from [place] to [place] about 2-1/2 years ago. She says she's been on her 6 month weight loss program. She says her weight been as high as 400 pounds. Her weight today is 361 pounds. Her BMI is 59.3.  She has tried multiple diets including Weight Watchers, Slim fast, Rickard Patience, herbal life, and Medifast. She tried Fen/Phen.  She does not state of anyone she knows she had the surgery. She attributes a lot of weight gain to the loss of her father 51, the loss of her mother and 2005, depression secondary these losses, and food as a comfort.   Per the [facility], the patient is a candidate for bariatric surgery. The patient attended an information session and reviewed the types of  bariatric surgery.  The patient is interested in the Roux en Y Gastric Bypass. I discussed with the patient the indications and risks of bariatric surgery. The potential risks of surgery include, but are not limited to, bleeding, infection, leak from the bowel, DVT and PE, open surgery, long term nutrition consequences, and death. The patient understands the importance of compliance and long term follow-up with our group after surgery.  Plan: 1) I do want to her to lose to 340 pounds. This presents a calculated weight BMI of 56.6. She has met this goal.  Past medical history: 1. Hypertension for 4 years. 2. Sleep apnea. She's been titrated on CPAP at this time. 3. Had a pulmonary embolism as a teenager after breaking her right ankle.  She was on Coumadin for time. She has had no subsequent problems. 4. Gastroesophageal reflux. 5. Open gallbladder surgery in 2008 6. Diverticulitis twice in the last 2 years.  She was at [facility] for one of these episodes. She's had a colonoscopy showing diverticulosis. 7. She had a hysterectomy 05 Sep 2014 by Dr. Ihor Dow. 8. She has arthritis and knee problems bilaterally. She seen orthopedic surgeon who said she may need both knees replaced. She cannot remember the name of the orthopedic surgeon. She's had back surgery 2003. She's had right rotator cuff surgery twice. 9. She has fibromyalgia. [facility] -  she takes Lyrica and Percocet 10. On chronic pain meds - Percocet - 1 to 3 per day. 11. Chronic disabilty 12. Migraine HA's  Social history: Married. Has 13 yo daughter. Has 2 step children. Used to be a Marine scientist, now on disability   Other Problems Shann Medal, MD; 02/28/2015 12:24 PM) Arthritis Asthma Diabetes Mellitus Gastroesophageal Reflux Disease High blood pressure Migraine Headache  Past Surgical History Shann Medal, MD; 02/28/2015 12:24 PM) Cesarean Section - 1 Foot Surgery Left. Shoulder Surgery Right. Spinal Surgery - Lower Back Tonsillectomy  Allergies (Sonya Bynum, CMA; 02/28/2015 12:10 PM) Dilaudid *ANALGESICS - OPIOID* Levaquin *Fluoroquinolones** Tangerine Flavor *PHARMACEUTICAL ADJUVANTS* ACE Inhibitors Dyes IV contrast Dye  Medication History Davy Pique Bynum, CMA; 02/28/2015 12:14 PM) Albuterol (90MCG/ACT Aerosol Soln, Inhalation as needed) Active. AmLODIPine Besylate (10MG Tablet, Oral) Active. Symbicort (160-4.5MCG/ACT Aerosol, Inhalation) Active. Pepcid (20MG Tablet, Oral) Active. Ferrous Sulfate (325 (65 Fe)MG Tablet, Oral) Active. GlipiZIDE (10MG Tablet, Oral) Active. Losartan Potassium-HCTZ (100-25MG Tablet, Oral) Active. MetFORMIN HCl (1000MG Tablet, Oral) Active. Singulair (10MG Tablet, Oral) Active. Omeprazole (40MG Capsule DR, Oral) Active. Oxybutynin Chloride ER (10MG Tablet ER 24HR, Oral) Active. Oxycodone-Acetaminophen (7.5-325MG Tablet, Oral as needed) Active. Lyrica (75MG Capsule, Oral) Active. Maxalt (10MG Tablet, Oral) Active. Januvia (100MG Tablet, Oral) Active. Medications Reconciled Suboxone (2-0.5MG Film, Sublingual) Active.  Vitals (Sonya Bynum CMA; 02/28/2015 12:10 PM) 02/28/2015 12:09 PM Weight: 343.2 lb Height: 65in Body Surface Area: 2.49 m Body Mass Index: 57.11 kg/m  Temp.: 47F(Temporal)  Pulse: 79 (Regular)  BP: 132/82 (Sitting, Left Arm, Standard)   Physical Exam  General: Obese AA F who is alert and generally healthy appearing. HEENT: Normal. Pupils equal.  Neck: Supple. No mass. No thyroid mass.  Lymph Nodes: No supraclavicular or cervical nodes.  Lungs: Clear to auscultation and symmetric breath sounds. Heart: RRR. No murmur or rub.  Abdomen: Soft. No mass. No tenderness. No hernia. Normal bowel sounds.  She has a RUQ scar (GB), pfannenstiel scar (recent hysterectomy),  umbilical scar She is about 1/2 apple and 1/2 pear, but because of her size, she has a large abdomen.  Extremities: Good strength and ROM in upper and lower extremities.  Neurologic: Grossly intact to motor and sensory function. Psychiatric: Has normal mood and affect. Behavior is normal.  Assessment & Plan  1.  OBESITY, MORBID, BMI 50 OR HIGHER (E66.01)  Impression: Plan:  She is at 343 pouds today - BMI 57.1. So I think that we can go ahead with surgery.   The remainder of her workup is complete.   2.  OBSTRUCTIVE SLEEP APNEA, ADULT (G47.33)  She's been titrated on CPAP at this time. 3.  FIBROMYALGIA (M79.7)  She [facility] - she takes Lyrica and Percocet 4. Hypertension for 4 years. 5. Had a pulmonary embolism as a teenager after breaking her right ankle.  She was on Coumadin for time. She has had no subsequent problems.  6. Gastroesophageal reflux. 7. Open gallbladder surgery in 2008 8. Diverticulitis twice in the last 2 years.  She was at [facility] for one of these episodes. She's had a colonoscopy showing diverticulosis. 9. She had a hysterectomy 05 Sep 2014 by Dr. Ihor Dow. 10. She has arthritis and knee problems bilaterally. She seen orthopedic surgeon who said she may need both knees replaced. She cannot remember the name of the orthopedic surgeon.  She's had back surgery 2003. She's had right rotator cuff surgery twice.  11. On chronic pain meds -  Percocet - 1 to 3 per day. 12. Chronic disabilty 13. Migraine HA's   Alphonsa Overall, MD, [facility] Pager: [phone] Office phone:  [phone]

## 2015-03-13 ENCOUNTER — Inpatient Hospital Stay (HOSPITAL_COMMUNITY): Payer: Medicare Other | Admitting: Anesthesiology

## 2015-03-13 ENCOUNTER — Encounter (HOSPITAL_COMMUNITY): Payer: Self-pay

## 2015-03-13 ENCOUNTER — Inpatient Hospital Stay (HOSPITAL_COMMUNITY)
Admission: RE | Admit: 2015-03-13 | Discharge: 2015-03-15 | DRG: 621 | Disposition: A | Discharge status: Home / Self Care | Payer: Medicare Other | Source: Ambulatory Visit | Attending: Surgery | Admitting: Surgery

## 2015-03-13 ENCOUNTER — Encounter (HOSPITAL_COMMUNITY): Payer: Self-pay | Admitting: *Deleted

## 2015-03-13 ENCOUNTER — Encounter (HOSPITAL_COMMUNITY)
Admission: RE | Disposition: A | Discharge status: Home / Self Care | Payer: Self-pay | Source: Ambulatory Visit | Attending: Surgery

## 2015-03-13 DIAGNOSIS — Z01812 Encounter for preprocedural laboratory examination: Secondary | ICD-10-CM

## 2015-03-13 DIAGNOSIS — G8929 Other chronic pain: Secondary | ICD-10-CM | POA: Diagnosis present

## 2015-03-13 DIAGNOSIS — J45909 Unspecified asthma, uncomplicated: Secondary | ICD-10-CM | POA: Diagnosis present

## 2015-03-13 DIAGNOSIS — K219 Gastro-esophageal reflux disease without esophagitis: Secondary | ICD-10-CM | POA: Diagnosis present

## 2015-03-13 DIAGNOSIS — K660 Peritoneal adhesions (postprocedural) (postinfection): Secondary | ICD-10-CM | POA: Diagnosis present

## 2015-03-13 DIAGNOSIS — I10 Essential (primary) hypertension: Secondary | ICD-10-CM | POA: Diagnosis present

## 2015-03-13 DIAGNOSIS — Z9889 Other specified postprocedural states: Secondary | ICD-10-CM | POA: Diagnosis not present

## 2015-03-13 DIAGNOSIS — Z86711 Personal history of pulmonary embolism: Secondary | ICD-10-CM | POA: Diagnosis not present

## 2015-03-13 DIAGNOSIS — M170 Bilateral primary osteoarthritis of knee: Secondary | ICD-10-CM | POA: Diagnosis present

## 2015-03-13 DIAGNOSIS — Z6841 Body Mass Index (BMI) 40.0 and over, adult: Secondary | ICD-10-CM | POA: Diagnosis not present

## 2015-03-13 DIAGNOSIS — G4733 Obstructive sleep apnea (adult) (pediatric): Secondary | ICD-10-CM | POA: Diagnosis present

## 2015-03-13 DIAGNOSIS — M797 Fibromyalgia: Secondary | ICD-10-CM | POA: Diagnosis present

## 2015-03-13 DIAGNOSIS — E119 Type 2 diabetes mellitus without complications: Secondary | ICD-10-CM | POA: Diagnosis present

## 2015-03-13 DIAGNOSIS — Z79899 Other long term (current) drug therapy: Secondary | ICD-10-CM

## 2015-03-13 DIAGNOSIS — Z9884 Bariatric surgery status: Secondary | ICD-10-CM

## 2015-03-13 HISTORY — PX: UPPER GI ENDOSCOPY: SHX6162

## 2015-03-13 HISTORY — PX: GASTRIC ROUX-EN-Y: SHX5262

## 2015-03-13 HISTORY — PX: LAPAROSCOPIC LYSIS OF ADHESIONS: SHX5905

## 2015-03-13 LAB — HEMOGLOBIN AND HEMATOCRIT, BLOOD
HEMATOCRIT: 32.6 % — AB (ref 36.0–46.0)
HEMOGLOBIN: 10.3 g/dL — AB (ref 12.0–15.0)

## 2015-03-13 LAB — GLUCOSE, CAPILLARY
GLUCOSE-CAPILLARY: 178 mg/dL — AB (ref 65–99)
GLUCOSE-CAPILLARY: 178 mg/dL — AB (ref 65–99)
Glucose-Capillary: 139 mg/dL — ABNORMAL HIGH (ref 65–99)
Glucose-Capillary: 253 mg/dL — ABNORMAL HIGH (ref 65–99)
Glucose-Capillary: 245 mg/dL — ABNORMAL HIGH (ref 65–99)
Glucose-Capillary: 238 mg/dL — ABNORMAL HIGH (ref 65–99)

## 2015-03-13 SURGERY — LAPAROSCOPIC ROUX-EN-Y GASTRIC BYPASS WITH UPPER ENDOSCOPY
Anesthesia: General

## 2015-03-13 MED ORDER — ACETAMINOPHEN 10 MG/ML IV SOLN
1000.0000 mg | Freq: Once | INTRAVENOUS | Status: AC
  Administered 2015-03-13: 1000 mg via INTRAVENOUS

## 2015-03-13 MED ORDER — BUPIVACAINE HCL (PF) 0.25 % IJ SOLN
INTRAMUSCULAR | Status: AC
  Filled 2015-03-13: qty 30

## 2015-03-13 MED ORDER — STERILE WATER FOR IRRIGATION IR SOLN
Status: DC | PRN
  Administered 2015-03-13: 2000 mL

## 2015-03-13 MED ORDER — ONDANSETRON HCL 4 MG/2ML IJ SOLN
4.0000 mg | INTRAMUSCULAR | Status: DC | PRN
  Administered 2015-03-14: 4 mg via INTRAVENOUS
  Filled 2015-03-13: qty 2

## 2015-03-13 MED ORDER — FENTANYL CITRATE (PF) 250 MCG/5ML IJ SOLN
INTRAMUSCULAR | Status: AC
  Filled 2015-03-13: qty 5

## 2015-03-13 MED ORDER — FENTANYL CITRATE (PF) 100 MCG/2ML IJ SOLN
INTRAMUSCULAR | Status: AC
  Filled 2015-03-13: qty 2

## 2015-03-13 MED ORDER — BUPIVACAINE HCL (PF) 0.25 % IJ SOLN
INTRAMUSCULAR | Status: DC | PRN
  Administered 2015-03-13: 30 mL

## 2015-03-13 MED ORDER — INSULIN ASPART 100 UNIT/ML ~~LOC~~ SOLN
8.0000 [IU] | Freq: Once | SUBCUTANEOUS | Status: AC
  Administered 2015-03-13: 8 [IU] via SUBCUTANEOUS

## 2015-03-13 MED ORDER — MEPERIDINE HCL 50 MG/ML IJ SOLN: 6.2500 mg | INTRAMUSCULAR | Status: DC | PRN

## 2015-03-13 MED ORDER — INSULIN ASPART 100 UNIT/ML ~~LOC~~ SOLN
SUBCUTANEOUS | Status: AC
  Filled 2015-03-13: qty 1

## 2015-03-13 MED ORDER — NEOSTIGMINE METHYLSULFATE 10 MG/10ML IV SOLN
INTRAVENOUS | Status: DC | PRN
  Administered 2015-03-13: 5 mg via INTRAVENOUS

## 2015-03-13 MED ORDER — ROCURONIUM BROMIDE 100 MG/10ML IV SOLN
INTRAVENOUS | Status: DC | PRN
  Administered 2015-03-13 (×4): 20 mg via INTRAVENOUS
  Administered 2015-03-13: 40 mg via INTRAVENOUS
  Administered 2015-03-13: 10 mg via INTRAVENOUS

## 2015-03-13 MED ORDER — DEXTROSE 5 % IV SOLN
INTRAVENOUS | Status: AC
  Filled 2015-03-13: qty 2

## 2015-03-13 MED ORDER — INSULIN ASPART 100 UNIT/ML ~~LOC~~ SOLN
6.0000 [IU] | Freq: Once | SUBCUTANEOUS | Status: AC
  Administered 2015-03-13: 6 [IU] via SUBCUTANEOUS

## 2015-03-13 MED ORDER — PROPOFOL 10 MG/ML IV BOLUS
INTRAVENOUS | Status: AC
  Filled 2015-03-13: qty 40

## 2015-03-13 MED ORDER — MIDAZOLAM HCL 2 MG/2ML IJ SOLN
INTRAMUSCULAR | Status: AC
  Filled 2015-03-13: qty 2

## 2015-03-13 MED ORDER — LIDOCAINE HCL (CARDIAC) 20 MG/ML IV SOLN
INTRAVENOUS | Status: DC | PRN
  Administered 2015-03-13: 100 mg via INTRAVENOUS

## 2015-03-13 MED ORDER — CHLORHEXIDINE GLUCONATE 4 % EX LIQD: 60.0000 mL | Freq: Once | CUTANEOUS | Status: DC

## 2015-03-13 MED ORDER — MORPHINE SULFATE (PF) 2 MG/ML IV SOLN
2.0000 mg | INTRAVENOUS | Status: DC | PRN
  Administered 2015-03-13: 2 mg via INTRAVENOUS
  Administered 2015-03-13: 4 mg via INTRAVENOUS
  Administered 2015-03-13 – 2015-03-15 (×5): 2 mg via INTRAVENOUS
  Filled 2015-03-13 (×2): qty 1
  Filled 2015-03-13: qty 2
  Filled 2015-03-13 (×5): qty 1

## 2015-03-13 MED ORDER — LACTATED RINGERS IV SOLN
INTRAVENOUS | Status: DC | PRN
  Administered 2015-03-13 (×2): via INTRAVENOUS

## 2015-03-13 MED ORDER — ACETAMINOPHEN 10 MG/ML IV SOLN
INTRAVENOUS | Status: AC
  Filled 2015-03-13: qty 100

## 2015-03-13 MED ORDER — FENTANYL CITRATE (PF) 100 MCG/2ML IJ SOLN
INTRAMUSCULAR | Status: DC | PRN
  Administered 2015-03-13: 50 ug via INTRAVENOUS
  Administered 2015-03-13: 100 ug via INTRAVENOUS
  Administered 2015-03-13 (×2): 50 ug via INTRAVENOUS

## 2015-03-13 MED ORDER — POTASSIUM CHLORIDE IN NACL 20-0.45 MEQ/L-% IV SOLN
INTRAVENOUS | Status: DC
  Administered 2015-03-13 – 2015-03-14 (×4): via INTRAVENOUS
  Administered 2015-03-14: 150 mL/h via INTRAVENOUS
  Administered 2015-03-14 – 2015-03-15 (×2): via INTRAVENOUS
  Filled 2015-03-13 (×9): qty 1000

## 2015-03-13 MED ORDER — HEPARIN SODIUM (PORCINE) 5000 UNIT/ML IJ SOLN
5000.0000 [IU] | INTRAMUSCULAR | Status: AC
  Administered 2015-03-13: 5000 [IU] via SUBCUTANEOUS
  Filled 2015-03-13: qty 1

## 2015-03-13 MED ORDER — PROPOFOL 10 MG/ML IV BOLUS
INTRAVENOUS | Status: DC | PRN
  Administered 2015-03-13: 40 mg via INTRAVENOUS
  Administered 2015-03-13: 280 mg via INTRAVENOUS

## 2015-03-13 MED ORDER — INSULIN ASPART 100 UNIT/ML ~~LOC~~ SOLN
0.0000 [IU] | SUBCUTANEOUS | Status: DC
  Administered 2015-03-13: 4 [IU] via SUBCUTANEOUS
  Administered 2015-03-13: 7 [IU] via SUBCUTANEOUS
  Administered 2015-03-13: 4 [IU] via SUBCUTANEOUS
  Administered 2015-03-14 (×2): 3 [IU] via SUBCUTANEOUS
  Administered 2015-03-14: 4 [IU] via SUBCUTANEOUS
  Administered 2015-03-15 (×2): 3 [IU] via SUBCUTANEOUS

## 2015-03-13 MED ORDER — GLYCOPYRROLATE 0.2 MG/ML IJ SOLN
INTRAMUSCULAR | Status: DC | PRN
  Administered 2015-03-13: .5 mg via INTRAVENOUS

## 2015-03-13 MED ORDER — LIDOCAINE HCL (CARDIAC) 20 MG/ML IV SOLN
INTRAVENOUS | Status: AC
  Filled 2015-03-13: qty 5

## 2015-03-13 MED ORDER — ONDANSETRON HCL 4 MG/2ML IJ SOLN
INTRAMUSCULAR | Status: AC
  Filled 2015-03-13: qty 2

## 2015-03-13 MED ORDER — DEXAMETHASONE SODIUM PHOSPHATE 10 MG/ML IJ SOLN
INTRAMUSCULAR | Status: DC | PRN
  Administered 2015-03-13: 10 mg via INTRAVENOUS

## 2015-03-13 MED ORDER — DEXTROSE 5 % IV SOLN
2.0000 g | INTRAVENOUS | Status: AC
  Administered 2015-03-13 (×2): 2 g via INTRAVENOUS

## 2015-03-13 MED ORDER — ACETAMINOPHEN 160 MG/5ML PO SOLN: 325.0000 mg | ORAL | Status: DC | PRN

## 2015-03-13 MED ORDER — ROCURONIUM BROMIDE 100 MG/10ML IV SOLN
INTRAVENOUS | Status: AC
  Filled 2015-03-13: qty 1

## 2015-03-13 MED ORDER — FENTANYL CITRATE (PF) 100 MCG/2ML IJ SOLN
25.0000 ug | INTRAMUSCULAR | Status: DC | PRN
  Administered 2015-03-13 (×2): 50 ug via INTRAVENOUS

## 2015-03-13 MED ORDER — ONDANSETRON HCL 4 MG/2ML IJ SOLN
INTRAMUSCULAR | Status: DC | PRN
  Administered 2015-03-13: 4 mg via INTRAVENOUS

## 2015-03-13 MED ORDER — OXYCODONE HCL 5 MG/5ML PO SOLN
5.0000 mg | ORAL | Status: DC | PRN
  Administered 2015-03-14: 5 mg via ORAL
  Filled 2015-03-13: qty 5

## 2015-03-13 MED ORDER — HEPARIN SODIUM (PORCINE) 5000 UNIT/ML IJ SOLN
5000.0000 [IU] | Freq: Three times a day (TID) | INTRAMUSCULAR | Status: DC
  Administered 2015-03-13 – 2015-03-15 (×6): 5000 [IU] via SUBCUTANEOUS
  Filled 2015-03-13 (×8): qty 1

## 2015-03-13 MED ORDER — NEOSTIGMINE METHYLSULFATE 10 MG/10ML IV SOLN
INTRAVENOUS | Status: AC
  Filled 2015-03-13: qty 1

## 2015-03-13 MED ORDER — PREMIER PROTEIN SHAKE
2.0000 [oz_av] | Freq: Four times a day (QID) | ORAL | Status: DC
  Administered 2015-03-15 (×3): 2 [oz_av] via ORAL

## 2015-03-13 MED ORDER — EVICEL 5 ML EX KIT
PACK | CUTANEOUS | Status: DC | PRN
  Administered 2015-03-13: 2

## 2015-03-13 MED ORDER — SODIUM CHLORIDE 0.9 % IJ SOLN
INTRAMUSCULAR | Status: AC
  Filled 2015-03-13: qty 10

## 2015-03-13 MED ORDER — SUCCINYLCHOLINE CHLORIDE 20 MG/ML IJ SOLN
INTRAMUSCULAR | Status: DC | PRN
  Administered 2015-03-13: 150 mg via INTRAVENOUS

## 2015-03-13 MED ORDER — TISSEEL VH 10 ML EX KIT
PACK | CUTANEOUS | Status: AC
  Filled 2015-03-13: qty 2

## 2015-03-13 MED ORDER — MIDAZOLAM HCL 5 MG/5ML IJ SOLN
INTRAMUSCULAR | Status: DC | PRN
  Administered 2015-03-13: 2 mg via INTRAVENOUS

## 2015-03-13 MED ORDER — LACTATED RINGERS IR SOLN
Status: DC | PRN
  Administered 2015-03-13: 3000 mL

## 2015-03-13 MED ORDER — ACETAMINOPHEN 160 MG/5ML PO SOLN: 650.0000 mg | ORAL | Status: DC | PRN

## 2015-03-13 MED ORDER — 0.9 % SODIUM CHLORIDE (POUR BTL) OPTIME
TOPICAL | Status: DC | PRN
  Administered 2015-03-13: 1000 mL

## 2015-03-13 MED ORDER — EPHEDRINE SULFATE 50 MG/ML IJ SOLN
INTRAMUSCULAR | Status: AC
  Filled 2015-03-13: qty 1

## 2015-03-13 MED ORDER — GLYCOPYRROLATE 0.2 MG/ML IJ SOLN
INTRAMUSCULAR | Status: AC
  Filled 2015-03-13: qty 3

## 2015-03-13 SURGICAL SUPPLY — 73 items
APPLIER CLIP ROT 10 11.4 M/L (STAPLE)
APPLIER CLIP ROT 13.4 12 LRG (CLIP)
BLADE SURG 15 STRL LF DISP TIS (BLADE) ×2 IMPLANT
BLADE SURG 15 STRL SS (BLADE) ×2
CABLE HIGH FREQUENCY MONO STRZ (ELECTRODE) ×4 IMPLANT
CHLORAPREP W/TINT 26ML (MISCELLANEOUS) ×8 IMPLANT
CLIP APPLIE ROT 10 11.4 M/L (STAPLE) IMPLANT
CLIP APPLIE ROT 13.4 12 LRG (CLIP) IMPLANT
CLIP SUT LAPRA TY ABSORB (SUTURE) ×12 IMPLANT
COVER SURGICAL LIGHT HANDLE (MISCELLANEOUS) ×4 IMPLANT
DECANTER SPIKE VIAL GLASS SM (MISCELLANEOUS) IMPLANT
DEVICE SUT QUICK LOAD TK 5 (STAPLE) IMPLANT
DEVICE SUT TI-KNOT TK 5X26 (MISCELLANEOUS) IMPLANT
DEVICE SUTURE ENDOST 10MM (ENDOMECHANICALS) ×4 IMPLANT
DEVICE TI KNOT TK5 (MISCELLANEOUS)
DISSECTOR BLUNT TIP ENDO 5MM (MISCELLANEOUS) IMPLANT
DRAIN PENROSE 18X1/4 LTX STRL (WOUND CARE) ×4 IMPLANT
DRAPE CAMERA CLOSED 9X96 (DRAPES) ×4 IMPLANT
GAUZE SPONGE 4X4 16PLY XRAY LF (GAUZE/BANDAGES/DRESSINGS) ×4 IMPLANT
GLOVE SURG SIGNA 7.5 PF LTX (GLOVE) ×4 IMPLANT
GOWN STRL REUS W/TWL XL LVL3 (GOWN DISPOSABLE) ×16 IMPLANT
HOVERMATT SINGLE USE (MISCELLANEOUS) ×4 IMPLANT
KIT BASIN OR (CUSTOM PROCEDURE TRAY) ×4 IMPLANT
KIT GASTRIC LAVAGE 34FR ADT (SET/KITS/TRAYS/PACK) ×4 IMPLANT
LIQUID BAND (GAUZE/BANDAGES/DRESSINGS) ×4 IMPLANT
LUBRICANT JELLY K Y 4OZ (MISCELLANEOUS) ×4 IMPLANT
NEEDLE SPNL 22GX3.5 QUINCKE BK (NEEDLE) ×4 IMPLANT
PACK CARDIOVASCULAR III (CUSTOM PROCEDURE TRAY) ×4 IMPLANT
PEN SKIN MARKING BROAD (MISCELLANEOUS) ×4 IMPLANT
QUICK LOAD TK 5 (STAPLE)
RELOAD 45 VASCULAR/THIN (ENDOMECHANICALS) ×8 IMPLANT
RELOAD ENDO STITCH 2.0 (ENDOMECHANICALS) ×20
RELOAD STAPLE TA45 3.5 REG BLU (ENDOMECHANICALS) ×8 IMPLANT
RELOAD STAPLER BLUE 60MM (STAPLE) ×6 IMPLANT
RELOAD STAPLER GOLD 60MM (STAPLE) ×4 IMPLANT
RELOAD STAPLER WHITE 60MM (STAPLE) IMPLANT
SCISSORS LAP 5X35 DISP (ENDOMECHANICALS) ×4 IMPLANT
SEALANT SURGICAL APPL DUAL CAN (MISCELLANEOUS) ×4 IMPLANT
SET IRRIG TUBING LAPAROSCOPIC (IRRIGATION / IRRIGATOR) ×4 IMPLANT
SHEARS HARMONIC ACE PLUS 36CM (ENDOMECHANICALS) ×4 IMPLANT
SLEEVE ADV FIXATION 12X100MM (TROCAR) ×8 IMPLANT
SLEEVE XCEL OPT CAN 5 100 (ENDOMECHANICALS) IMPLANT
SOLUTION ANTI FOG 6CC (MISCELLANEOUS) ×4 IMPLANT
STAPLE ECHEON FLEX 60 POW ENDO (STAPLE) IMPLANT
STAPLER ECHELON LONG 60 440 (INSTRUMENTS) ×4 IMPLANT
STAPLER RELOAD BLUE 60MM (STAPLE) ×12
STAPLER RELOAD GOLD 60MM (STAPLE) ×8
STAPLER RELOAD WHITE 60MM (STAPLE)
SUT MNCRL AB 4-0 PS2 18 (SUTURE) ×8 IMPLANT
SUT RELOAD ENDO STITCH 2 48X1 (ENDOMECHANICALS) ×12
SUT RELOAD ENDO STITCH 2.0 (ENDOMECHANICALS) ×8
SUT SURGIDAC NAB ES-9 0 48 120 (SUTURE) IMPLANT
SUT VIC AB 2-0 SH 27 (SUTURE) ×2
SUT VIC AB 2-0 SH 27X BRD (SUTURE) ×2 IMPLANT
SUTURE RELOAD END STTCH 2 48X1 (ENDOMECHANICALS) ×12 IMPLANT
SUTURE RELOAD ENDO STITCH 2.0 (ENDOMECHANICALS) ×8 IMPLANT
SYR 10ML ECCENTRIC (SYRINGE) ×4 IMPLANT
SYR 20CC LL (SYRINGE) ×8 IMPLANT
SYR 50ML LL SCALE MARK (SYRINGE) ×4 IMPLANT
TOWEL OR 17X26 10 PK STRL BLUE (TOWEL DISPOSABLE) ×4 IMPLANT
TRAY FOLEY W/METER SILVER 14FR (SET/KITS/TRAYS/PACK) ×4 IMPLANT
TRAY FOLEY W/METER SILVER 16FR (SET/KITS/TRAYS/PACK) IMPLANT
TROCAR ADV FIXATION 11X100MM (TROCAR) IMPLANT
TROCAR ADV FIXATION 12X100MM (TROCAR) ×4 IMPLANT
TROCAR ADV FIXATION 5X100MM (TROCAR) ×4 IMPLANT
TROCAR BLADELESS OPT 5 100 (ENDOMECHANICALS) ×4 IMPLANT
TROCAR UNIVERSAL OPT 12M 100M (ENDOMECHANICALS) IMPLANT
TROCAR XCEL 12X100 BLDLESS (ENDOMECHANICALS) IMPLANT
TROCAR XCEL NON-BLD 11X100MML (ENDOMECHANICALS) ×4 IMPLANT
TUBING CONNECTING 10 (TUBING) ×6 IMPLANT
TUBING CONNECTING 10' (TUBING) ×2
TUBING ENDO SMARTCAP (MISCELLANEOUS) ×4 IMPLANT
TUBING FILTER THERMOFLATOR (ELECTROSURGICAL) ×4 IMPLANT

## 2015-03-13 NOTE — Op Note (Signed)
PATIENT:   Angelica Ortiz DOB:   11/14/1965 MRN:   [id number]  DATE OF PROCEDURE: 03/13/2015                   [facility]  OPERATIVE REPORT  PREOPERATIVE DIAGNOSIS:  Morbid obesity.  POSTOPERATIVE DIAGNOSIS:  Morbid obesity (weight 343, BMI of 57.11), intra-abdominal adhesions  PROCEDURE:  Laparoscopic Roux-en-Y gastric bypass, antecolic, antegastric (intraoperative upper endoscopy by Dr. Sheliah HatchKinsinger), enterolysis of adhesions for 20 minutes  SURGEON:  Sandria Balesavid H. Ezzard StandingNewman, MD  FIRST ASSISTANGena Fray:  L. Kinsinger, MD  ANESTHESIA:  General endotracheal.  Anesthesiologist: Sebastian Acheheodore Manny, MD CRNA: Thornell MuleHoward G Stubblefield, CRNA; Donn Pieriniichard G Ouellette, CRNA  General  ESTIMATED BLOOD LOSS:  Minimal.  LOCAL ANESTHESIA:  30 cc of 1/4% Marcaine  COMPLICATIONS:  None.  INDICATION FOR SURGERY:  Angelica Serita ShellerM Ortiz is a 49 y.o. AA  female who sees EDWARDS, MICHELLE P, NP as her primary care doctor.  She has completed our preoperative bariatric program and now comes for a laparoscopic Roux-en-Y gastric bypass.  The indications, potential complications of surgery were explained to the patient.  Potential complications of the surgery include, but are not limited to, bleeding, infection, DVT, open surgery, and long-term nutritional consequences.  OPERATIVE NOTE:  The [facility] where Ms. Leticia Clasivera underwent a general endotracheal anesthetic, supervised by Anesthesiologist: Sebastian Acheheodore Manny, MD CRNA: Thornell MuleHoward G Stubblefield, CRNA; Donn Pieriniichard G Ouellette, CRNA.  The patient was given 2 g of cefoxitin at the beginning of the procedure.  A time-out was held and surgical checklist run.  The abdomen was prepped with ChloraPrep and sterilely draped.  I accessed the abdominal cavity through the left upper quadrant using a 12 mm Optiview trocar.   She had adhesions of omentum to the anterior abdominal wall, both in her upper abdomen, from the open gall bladder surgery,and in her lower abdomen, from her  hysterectomy.  I did put a 5 mm port in the left lateral abdomen to access the adhesions.  I spent about 20 minutes taking these adhesions down.  I placed 6 additional trocars: 5 mm subxiphoid, 12 mm right subcostal, 12 mm right paramedian, 12 mm left paramedian, 5 mm lateral subcostal, and a 11 mm below to the right of the umbilicus.  The abdomen was insufflated and abdominal exploration carried out.  Right and left lobes of liver,that I could see, unremarkable.  The right lobe was partially covered with omentum and part of the left lobe of the liver was attached to the anterior peritoneal surface.  The stomach that I could see was unremarkable.    I was able to push the omentum and transverse colon up and identified the ligament of Treitz to start the operation.  I measured 40 cm of the jejunum, starting at the ligament of Tritz, and divided the jejunum with a white load of 45 mm Ethicon Endo-GIA stapler.  I divided a short length into the mesentery.  I measured 100 cm of jejunum for the future gastric limb.  I put a Penrose drain on the future gastric limb of the jejunum.  I then did a side-to-side jejunojejunostomy.  I used a 45 mm white load of the Ethicon Endo-GIA stapler.  I closed the enterotomy with 2 running 2-0 Vicryl sutures.  I tested the JJ anastomosis with an alligator forceps and then covered this with Tisseel.  I closed the mesenteric defect with a running 2-0 silk suture with a Laparo-tye on each end.  I then divided the omentum with a Harmonic Scalpel.  I positioned the patient in reverse Trendelenburg and placed the liver retractor, which was introduced into the peritoneal cavity through a subxiphoid 5 mm trocar puncture, under the left lobe of the liver.  I then identified the gastroesophageal junction.  I went to the left at the angle of His and made a window at the left side of the esophago-gastric junction for a target as my dissection.  I then went on the lesser curve of  the stomach, measured 5 cm from the gastroesophageal junction down the lesser curve and dissected into the lesser sac from the lesser curvature side of the stomach.  I did the first firing of a 60 mm gold load Ethicon Endo-GIA stapler and then did 1 firings of the 60 mm gold load Ethicon Eschelon stapler and 3 blue loads of the 60 mm Ethicon Eschelon stapler.  This created a gastric pouch approximately 5 cm in length and 3 cm in width.  There was no bleeding from either the pouch or the stomach remnant site.  I placed Tisseel on the pouch side along the new greater curvature.  I over sewed the gastric remnant with a locking 2-0 Vicryl suture with a Laparo-tye on each end..  I then brought the jejunum ante-colic, ante-gastric up to the new stomach pouch and placed a posterior running 2-0 Vicryl suture.  I then made an enterotomy into the stomach using the Ewald as a back stop and an enterotomy into the jejunum.  I did a stapled side-to-side gastrojejunal anastomosis using these two enterotomies with a 45 mm blue load of the Ethicon Endo GIA stapler.  I tried to create a 2.5 cm gastrojejunal anastomosis.  I closed the enterotomy with a 2 running 2-0 Vicryl sutures.  I passed the Ewald tube through the gastrojejunal anastomosis and then did an anterior Connell suture running of 2-0 Vicryl suture for the anterior layer of the gastrojejunostomy.  The Ewald tube was then removed without difficulty.  I then closed the Castleberry defect with a figure-of-eight 2-0 silk suture between the mesentery of the transverse colon and the mesentery of the distal jejunum.  Dr. Sheliah Hatch then scrubbed out and did an intraoperative upper endoscopy.  He identified the esophagogastric junction about 40 cm, the gastrojejunal anastomosis about 46 cm.  I clamped off the small bowel.  He insufflated air and I flooded the abdomen with saline. There was no bubbling or evidence of air leak.  He then withdrew the scope and he will  dictate that portion of the operation.    I then re-inspected the anastomoses, sucked out the saline, placed Tisseel over the stomach pouch and gastrojejunal anastomosis.   The liver retractor was removed.  The trocars were removed.  There was no bleeding at any trocar site.  The skin at each trocar site was closed with a 4-0 Monocryl suture.  I infiltrated a total about 30 cc of 0.25% Marcaine at the trocar sites.    After the skin incisions were closed with sutures they were painted with LiquidBand.  The sponge and needle count were correct at the end of the case.  The patient tolerated the procedure well, was transported to the recovery room in good condition.   Ovidio Kin, MD, [facility] Surgery Pager: [phone] Office phone:  [phone]

## 2015-03-13 NOTE — Anesthesia Postprocedure Evaluation (Signed)
Anesthesia Post Note  Patient: Angelica Ortiz  Procedure(s) Performed: Procedure(s) (LRB): LAPAROSCOPIC ROUX-EN-Y GASTRIC BYPASS  ENTEROLYSIS OF ADHESIONS WITH UPPER ENDOSCOPY (N/A) LAPAROSCOPIC LYSIS OF ADHESIONS UPPER GI ENDOSCOPY  Patient location during evaluation: PACU Anesthesia Type: General Level of consciousness: awake and alert Pain management: pain level controlled Vital Signs Assessment: post-procedure vital signs reviewed and stable Respiratory status: spontaneous breathing, nonlabored ventilation, respiratory function stable and patient connected to nasal cannula oxygen Cardiovascular status: blood pressure returned to baseline and stable Postop Assessment: no signs of nausea or vomiting Anesthetic complications: no Comments: Blood sugars elevated, 260, gave 8 units insulin, now 240, will give 6 units more and re-check.    Last Vitals:  Filed Vitals:   03/13/15 1145 03/13/15 1200  BP:    Pulse:    Temp: 37 C 37 C  Resp:      Last Pain:  Filed Vitals:   03/13/15 1207  PainSc: 4                  Kynsleigh Westendorf

## 2015-03-13 NOTE — Progress Notes (Signed)
Utilization review completed.  

## 2015-03-13 NOTE — Op Note (Signed)
Preoperative diagnosis: Roux-en-Y gastric bypass  Postoperative diagnosis: Same   Procedure: Upper endoscopy   Surgeon: Feliciana RossettiLuke Lamone Ferrelli, M.D.  Anesthesia: Gen.   Indications for procedure: This patient was undergoing a Roux-en-Y gastric bypass.   Description of procedure: The endoscopy was placed in the mouth and into the oropharynx and under endoscopic vision it was advanced to the esophagogastric junction. The pouch was insufflated and no bleeding or bubbles were seen. The GEJ was identified at 40cm from the teeth. The anastomosis was patent and identified at 46cm. No bleeding or leaks were detected. The scope was withdrawn without difficulty.   Feliciana RossettiLuke Beautifull Cisar, M.D. General, Bariatric, & Minimally Invasive Surgery [facility]

## 2015-03-13 NOTE — Anesthesia Procedure Notes (Signed)
Procedure Name: Intubation Date/Time: 03/13/2015 7:24 AM Performed by: Thornell MuleSTUBBLEFIELD, Sherryn Pollino G Pre-anesthesia Checklist: Patient identified, Emergency Drugs available, Suction available and Patient being monitored Patient Re-evaluated:Patient Re-evaluated prior to inductionOxygen Delivery Method: Circle System Utilized Preoxygenation: Pre-oxygenation with 100% oxygen Intubation Type: IV induction Ventilation: Mask ventilation without difficulty Laryngoscope Size: Mac and 3 Grade View: Grade III Tube type: Oral Tube size: 7.0 mm Number of attempts: 1 Airway Equipment and Method: Stylet and Oral airway Placement Confirmation: ETT inserted through vocal cords under direct vision,  positive ETCO2 and breath sounds checked- equal and bilateral Tube secured with: Tape Dental Injury: Teeth and Oropharynx as per pre-operative assessment  Difficulty Due To: Difficulty was anticipated, Difficult Airway- due to large tongue and Difficult Airway- due to anterior larynx Future Recommendations: Recommend- induction with short-acting agent, and alternative techniques readily available

## 2015-03-13 NOTE — Transfer of Care (Signed)
Immediate Anesthesia Transfer of Care Note  Patient: Miyuki Serita ShellerM Rivera  Procedure(s) Performed: Procedure(s): LAPAROSCOPIC ROUX-EN-Y GASTRIC BYPASS  ENTEROLYSIS OF ADHESIONS WITH UPPER ENDOSCOPY (N/A) LAPAROSCOPIC LYSIS OF ADHESIONS UPPER GI ENDOSCOPY  Patient Location: PACU  Anesthesia Type:General  Level of Consciousness: awake, alert  and oriented  Airway & Oxygen Therapy: Patient Spontanous Breathing and Patient connected to face mask oxygen  Post-op Assessment: Report given to RN and Post -op Vital signs reviewed and stable  Post vital signs: Reviewed and stable  Last Vitals:  Filed Vitals:   03/13/15 0549  BP: 150/92  Pulse: 102  Temp: 36.7 C  Resp: 16    Complications: No apparent anesthesia complications

## 2015-03-13 NOTE — Interval H&P Note (Signed)
History and Physical Interval Note:  03/13/2015 7:11 AM  Angelica Ortiz  has presented today for surgery, with the diagnosis of Morbid Obesity  The various methods of treatment have been discussed with the patient and family.  Her husband is here with her today.   After consideration of risks, benefits and other options for treatment, the patient has consented to  Procedure(s): LAPAROSCOPIC ROUX-EN-Y GASTRIC BYPASS WITH UPPER ENDOSCOPY (N/A) as a surgical intervention .  The patient's history has been reviewed, patient examined, no change in status, stable for surgery.  I have reviewed the patient's chart and labs.  Questions were answered to the patient's satisfaction.     Afsheen Antony H

## 2015-03-14 ENCOUNTER — Inpatient Hospital Stay (HOSPITAL_COMMUNITY): Payer: Medicare Other

## 2015-03-14 DIAGNOSIS — Z9889 Other specified postprocedural states: Secondary | ICD-10-CM

## 2015-03-14 LAB — GLUCOSE, CAPILLARY
GLUCOSE-CAPILLARY: 133 mg/dL — AB (ref 65–99)
GLUCOSE-CAPILLARY: 115 mg/dL — AB (ref 65–99)
GLUCOSE-CAPILLARY: 116 mg/dL — AB (ref 65–99)
Glucose-Capillary: 119 mg/dL — ABNORMAL HIGH (ref 65–99)
Glucose-Capillary: 144 mg/dL — ABNORMAL HIGH (ref 65–99)
Glucose-Capillary: 156 mg/dL — ABNORMAL HIGH (ref 65–99)

## 2015-03-14 LAB — CBC WITH DIFFERENTIAL/PLATELET
Basophils Absolute: 0 10*3/uL (ref 0.0–0.1)
Basophils Relative: 0 %
EOS ABS: 0 10*3/uL (ref 0.0–0.7)
Eosinophils Relative: 0 %
HCT: 34 % — ABNORMAL LOW (ref 36.0–46.0)
HEMOGLOBIN: 10.7 g/dL — AB (ref 12.0–15.0)
LYMPHS ABS: 1.3 10*3/uL (ref 0.7–4.0)
LYMPHS PCT: 13 %
MCH: 22.6 pg — ABNORMAL LOW (ref 26.0–34.0)
MCHC: 31.5 g/dL (ref 30.0–36.0)
MCV: 71.9 fL — ABNORMAL LOW (ref 78.0–100.0)
Monocytes Absolute: 0.6 10*3/uL (ref 0.1–1.0)
Monocytes Relative: 6 %
NEUTROS ABS: 8.2 10*3/uL — AB (ref 1.7–7.7)
NEUTROS PCT: 81 %
Platelets: 252 10*3/uL (ref 150–400)
RBC: 4.73 MIL/uL (ref 3.87–5.11)
RDW: 20.6 % — AB (ref 11.5–15.5)
WBC: 10.2 10*3/uL (ref 4.0–10.5)

## 2015-03-14 LAB — HEMOGLOBIN AND HEMATOCRIT, BLOOD
HEMATOCRIT: 33.3 % — AB (ref 36.0–46.0)
HEMOGLOBIN: 10.3 g/dL — AB (ref 12.0–15.0)

## 2015-03-14 MED ORDER — IOHEXOL 300 MG/ML  SOLN
50.0000 mL | Freq: Once | INTRAMUSCULAR | Status: AC | PRN
  Administered 2015-03-14: 50 mL via INTRAVENOUS

## 2015-03-14 NOTE — Progress Notes (Signed)
General Surgery Note  LOS: 1 day  POD -  1 Day Post-Op  Assessment/Plan: 1.  LAPAROSCOPIC ROUX-EN-Y GASTRIC BYPASS  ENTEROLYSIS OF ADHESIONS WITH UPPER ENDOSCOPY, LAPAROSCOPIC LYSIS OF ADHESIONS UPPER GI ENDOSCOPY - 03/13/2015 - D. Sarabeth Benton       For swallow today  Looks okay  2. OBSTRUCTIVE SLEEP APNEA, ADULT (G47.33) She's been titrated on CPAP at this time. 3. FIBROMYALGIA (M79.7) [facility] - she takes Lyrica and Percocet 4. Hypertension for 4 years. 5. Had a pulmonary embolism as a teenager after breaking her right ankle.  She was on Coumadin for time. She has had no subsequent problems.  6. Gastroesophageal reflux. 7. Open gallbladder surgery in 2008 8. Diverticulitis twice in the last 2 years.  [facility] one [facility] these episodes. She's had a colonoscopy showing diverticulosis. 9. She had a hysterectomy 05 Sep 2014 by Dr. Erin FullingHarraway-Smith. 10. She has arthritis and knee problems bilaterally. She seen orthopedic surgeon who said she may need both knees replaced. She cannot remember the name of the orthopedic surgeon. She's had back surgery 2003. She's had right rotator cuff surgery twice.  11. On chronic pain meds - Percocet - 1 to 3 per day. 12. Chronic disabilty 13. Migraine HA's  14.  DVT prophylaxis - SQ Heparin   Active Problems:   Morbid obesity (HCC)   Subjective:  Doing okay. Some pain.  No nausea.  Has mobilized.  She does not have a CPAP Objective:   Filed Vitals:   03/14/15 0118 03/14/15 0531  BP: 129/74 130/65  Pulse: 95 90  Temp: 98.8 F (37.1 C) 98.3 F (36.8 C)  Resp: 16 15     Intake/Output from previous day:  12/06 0701 - 12/07 0700 In: 2085 [I.V.:2085] Out: 3425 [Urine:3375; Blood:50]  Intake/Output this shift:      Physical Exam:   General: Obese AA F who is alert and oriented.    HEENT: Normal. Pupils  equal. .   Lungs: Clear   Abdomen: Soft   Wound: Clean     Lab Results:    Recent Labs  03/13/15 1820 03/14/15 0435  WBC  --  10.2  HGB 10.3* 10.7*  HCT 32.6* 34.0*  PLT  --  252    BMET  No results for input(s): NA, K, CL, CO2, GLUCOSE, BUN, CREATININE, CALCIUM in the last 72 hours.  PT/INR  No results for input(s): LABPROT, INR in the last 72 hours.  ABG  No results for input(s): PHART, HCO3 in the last 72 hours.  Invalid input(s): PCO2, PO2   Studies/Results:  No results found.   Anti-infectives:   Anti-infectives    Start     Dose/Rate Route Frequency Ordered Stop   03/13/15 0605  cefOXitin (MEFOXIN) 2 g in dextrose 5 % 50 mL IVPB     2 g 100 mL/hr over 30 Minutes Intravenous On call to O.R. 03/13/15 0605 03/13/15 [id number]      Ovidio Kinavid Teanna Elem, MD, FACS Pager: [phone] [facility] Office: [phone] 03/14/2015

## 2015-03-14 NOTE — Plan of Care (Signed)
Problem: Food- and Nutrition-Related Knowledge Deficit (NB-1.1) Goal: Nutrition education Formal process to instruct or train a patient/client in a skill or to impart knowledge to help patients/clients voluntarily manage or modify food choices and eating behavior to maintain or improve health. Outcome: Completed/Met Date Met:  03/14/15 Nutrition Education Note  Received consult for diet education per DROP protocol.   Discussed 2 week post op diet with pt. Emphasized that liquids must be non carbonated, non caffeinated, and sugar free. Fluid goals discussed. Pt to follow up with outpatient bariatric RD for further diet progression after 2 weeks. Multivitamins and minerals also reviewed. Teach back method used, pt expressed understanding, expect good compliance.   Diet: First 2 Weeks  You will see the nutritionist about two (2) weeks after your surgery. The nutritionist will increase the types of foods you can eat if you are handling liquids well:  If you have severe vomiting or nausea and cannot handle clear liquids lasting longer than 1 day, call your surgeon  Protein Shake  Drink at least 2 ounces of shake 5-6 times per day  Each serving of protein shakes (usually 8 - 12 ounces) should have a minimum of:  15 grams of protein  And no more than 5 grams of carbohydrate  Goal for protein each day:  Men = 80 grams per day  Women = 60 grams per day  Protein powder may be added to fluids such as non-fat milk or Lactaid milk or Soy milk (limit to 35 grams added protein powder per serving)   Hydration  Slowly increase the amount of water and other clear liquids as tolerated (See Acceptable Fluids)  Slowly increase the amount of protein shake as tolerated  Sip fluids slowly and throughout the day  May use sugar substitutes in small amounts (no more than 6 - 8 packets per day; i.e. Splenda)   Fluid Goal  The first goal is to drink at least 8 ounces of protein shake/drink per day (or as directed  by the nutritionist); some examples of protein shakes are [employer], [employer], EAS Edge HP, and Unjury. See handout from pre-op Bariatric Education Class:  Slowly increase the amount of protein shake you drink as tolerated  You may find it easier to slowly sip shakes throughout the day  It is important to get your proteins in first  Your fluid goal is to drink 64 - 100 ounces of fluid daily  It may take a few weeks to build up to this  32 oz (or more) should be clear liquids  And  32 oz (or more) should be full liquids (see below for examples)  Liquids should not contain sugar, caffeine, or carbonation   Clear Liquids:  Water or Sugar-free flavored water (i.e. Fruit H2O, Propel)  Decaffeinated coffee or tea (sugar-free)  Crystal Lite, Wyler's Lite, Minute Maid Lite  Sugar-free Jell-O  Bouillon or broth  Sugar-free Popsicle: *Less than 20 calories each; Limit 1 per day   Full Liquids:  Protein Shakes/Drinks + 2 choices per day of other full liquids  Full liquids must be:  No More Than 12 grams of Carbs per serving  No More Than 3 grams of Fat per serving  Strained low-fat cream soup  Non-Fat milk  Fat-free Lactaid Milk  Sugar-free yogurt (Dannon Lite & Fit, Greek yogurt)     Clayton Bibles, MS, RD, LDN Pager: [phone] After Hours Pager: [phone]

## 2015-03-14 NOTE — Progress Notes (Signed)
Patient alert and oriented, Post op day 1.  Provided support and encouragement.  Encouraged pulmonary toilet, ambulation and small sips of liquids.  All questions answered.  Will continue to monitor. 

## 2015-03-14 NOTE — Progress Notes (Signed)
VASCULAR LAB PRELIMINARY  PRELIMINARY  PRELIMINARY  PRELIMINARY  Bilateral lower extremity venous duplex  completed.    Preliminary report:  Bilateral:  No evidence of DVT, superficial thrombosis, or Baker's Cyst.    Teresia Myint, RVT 03/14/2015, 9:04 AM

## 2015-03-14 NOTE — Progress Notes (Signed)
MD contacted to receive verbal order for POD#1 diet after completion of UGI. MD gave consent telephone with readback, after validation of successful UGI.

## 2015-03-15 LAB — CBC WITH DIFFERENTIAL/PLATELET
BASOS ABS: 0 10*3/uL (ref 0.0–0.1)
Basophils Relative: 0 %
EOS ABS: 0.1 10*3/uL (ref 0.0–0.7)
Eosinophils Relative: 1 %
HEMATOCRIT: 31.5 % — AB (ref 36.0–46.0)
HEMOGLOBIN: 9.5 g/dL — AB (ref 12.0–15.0)
LYMPHS PCT: 35 %
Lymphs Abs: 2.3 10*3/uL (ref 0.7–4.0)
MCH: 21.7 pg — ABNORMAL LOW (ref 26.0–34.0)
MCHC: 30.2 g/dL (ref 30.0–36.0)
MCV: 71.9 fL — ABNORMAL LOW (ref 78.0–100.0)
MONOS PCT: 9 %
Monocytes Absolute: 0.6 10*3/uL (ref 0.1–1.0)
NEUTROS PCT: 55 %
Neutro Abs: 3.6 10*3/uL (ref 1.7–7.7)
Platelets: 196 10*3/uL (ref 150–400)
RBC: 4.38 MIL/uL (ref 3.87–5.11)
RDW: 20.5 % — ABNORMAL HIGH (ref 11.5–15.5)
WBC: 6.6 10*3/uL (ref 4.0–10.5)

## 2015-03-15 LAB — GLUCOSE, CAPILLARY
GLUCOSE-CAPILLARY: 119 mg/dL — AB (ref 65–99)
GLUCOSE-CAPILLARY: 119 mg/dL — AB (ref 65–99)
GLUCOSE-CAPILLARY: 123 mg/dL — AB (ref 65–99)
GLUCOSE-CAPILLARY: 125 mg/dL — AB (ref 65–99)

## 2015-03-15 NOTE — Progress Notes (Signed)
Patient alert and oriented, pain controlled. Patient given discharge instructions with verbalization of understanding. All questions answered.

## 2015-03-15 NOTE — Discharge Summary (Signed)
Physician Discharge Summary  Patient ID:  Angelica Ortiz  MRN: [id number]  DOB/AGE: May 13, 1965 49 y.o.  Admit date: 03/13/2015 Discharge date: 03/15/2015  Discharge Diagnoses:  1.  Morbid obesity  Weight - 343, BMI of 57.11 2. OBSTRUCTIVE SLEEP APNEA, ADULT (G47.33) She's been titrated on CPAP at this time. 3. FIBROMYALGIA (M79.7) [facility] - she takes Lyrica and Percocet 4. Hypertension for 4 years. 5. Had a pulmonary embolism as a teenager after breaking her right ankle.  She was on Coumadin for time. She has had no subsequent problems.  6. Gastroesophageal reflux. 7. Open gallbladder surgery in 2008 8. Diverticulitis twice in the last 2 years.  She was at [facility] for one of these episodes. She's had a colonoscopy showing diverticulosis. 9. She had a hysterectomy 05 Sep 2014 by Dr. Erin Fulling. 10. She has arthritis and knee problems bilaterally. She seen orthopedic surgeon who said she may need both knees replaced. She cannot remember the name of the orthopedic surgeon. She's had back surgery 2003. She's had right rotator cuff surgery twice.  11. On chronic pain meds - Percocet - 1 to 3 per day. 12. Chronic disabilty 13. Migraine HA's   Active Problems:   Morbid obesity (HCC)   Operation: Procedure(s): LAPAROSCOPIC ROUX-EN-Y GASTRIC BYPASS  ENTEROLYSIS OF ADHESIONS WITH UPPER ENDOSCOPY, LAPAROSCOPIC LYSIS OF ADHESIONS UPPER GI ENDOSCOPY on 03/13/2015 - D.Ezzard Standing  Discharged Condition: good  Hospital Course: Angelica Ortiz Leticia Clas is an 49 y.o. female whose primary care physician is EDWARDS, Kinnie Scales, NP and who was admitted 03/13/2015 with a chief complaint of morbid obesity. She was brought to the operating room on 03/13/2015 and underwent  LAPAROSCOPIC ROUX-EN-Y GASTRIC BYPASS  ENTEROLYSIS OF ADHESIONS WITH UPPER ENDOSCOPY, LAPAROSCOPIC LYSIS OF  ADHESIONS UPPER GI ENDOSCOPY.  She underwent a swallow the first post op day which showed normal post op anatomy. Dopplers of her LE were normal.  She is now 2 days post op. She wants to try to go home.  She will take her protein drinks and, if tolerated, head home.  The discharge instructions were reviewed with the patient.  Consults: None  Significant Diagnostic Studies: Results for orders placed or performed during the hospital encounter of 03/13/15  Glucose, capillary  Result Value Ref Range   Glucose-Capillary 139 (H) 65 - 99 mg/dL   Comment 1 Notify RN   Glucose, capillary  Result Value Ref Range   Glucose-Capillary 253 (H) 65 - 99 mg/dL   Comment 1 Notify RN    Comment 2 Document in Chart   Glucose, capillary  Result Value Ref Range   Glucose-Capillary 245 (H) 65 - 99 mg/dL   Comment 1 Notify RN    Comment 2 Document in Chart   Hemoglobin and hematocrit, blood  Result Value Ref Range   Hemoglobin 10.3 (L) 12.0 - 15.0 g/dL   HCT 40.9 (L) 81.1 - 91.4 %  Glucose, capillary  Result Value Ref Range   Glucose-Capillary 238 (H) 65 - 99 mg/dL  Glucose, capillary  Result Value Ref Range   Glucose-Capillary 178 (H) 65 - 99 mg/dL  CBC WITH DIFFERENTIAL  Result Value Ref Range   WBC 10.2 4.0 - 10.5 K/uL   RBC 4.73 3.87 - 5.11 MIL/uL   Hemoglobin 10.7 (L) 12.0 - 15.0 g/dL   HCT 78.2 (L) 95.6 - 21.3 %   MCV 71.9 (L) 78.0 - 100.0 fL   MCH 22.6 (L) 26.0 - 34.0  pg   MCHC 31.5 30.0 - 36.0 g/dL   RDW 16.1 (H) 09.6 - 04.5 %   Platelets 252 150 - 400 K/uL   Neutrophils Relative % 81 %   Neutro Abs 8.2 (H) 1.7 - 7.7 K/uL   Lymphocytes Relative 13 %   Lymphs Abs 1.3 0.7 - 4.0 K/uL   Monocytes Relative 6 %   Monocytes Absolute 0.6 0.1 - 1.0 K/uL   Eosinophils Relative 0 %   Eosinophils Absolute 0.0 0.0 - 0.7 K/uL   Basophils Relative 0 %   Basophils Absolute 0.0 0.0 - 0.1 K/uL  Glucose, capillary  Result Value Ref Range   Glucose-Capillary 178 (H) 65 - 99 mg/dL  Hemoglobin  and hematocrit, blood  Result Value Ref Range   Hemoglobin 10.3 (L) 12.0 - 15.0 g/dL   HCT 40.9 (L) 81.1 - 91.4 %  Glucose, capillary  Result Value Ref Range   Glucose-Capillary 156 (H) 65 - 99 mg/dL  Glucose, capillary  Result Value Ref Range   Glucose-Capillary 144 (H) 65 - 99 mg/dL  Glucose, capillary  Result Value Ref Range   Glucose-Capillary 133 (H) 65 - 99 mg/dL  Glucose, capillary  Result Value Ref Range   Glucose-Capillary 119 (H) 65 - 99 mg/dL  Glucose, capillary  Result Value Ref Range   Glucose-Capillary 115 (H) 65 - 99 mg/dL  CBC with Differential  Result Value Ref Range   WBC 6.6 4.0 - 10.5 K/uL   RBC 4.38 3.87 - 5.11 MIL/uL   Hemoglobin 9.5 (L) 12.0 - 15.0 g/dL   HCT 78.2 (L) 95.6 - 21.3 %   MCV 71.9 (L) 78.0 - 100.0 fL   MCH 21.7 (L) 26.0 - 34.0 pg   MCHC 30.2 30.0 - 36.0 g/dL   RDW 08.6 (H) 57.8 - 46.9 %   Platelets 196 150 - 400 K/uL   Neutrophils Relative % 55 %   Lymphocytes Relative 35 %   Monocytes Relative 9 %   Eosinophils Relative 1 %   Basophils Relative 0 %   Neutro Abs 3.6 1.7 - 7.7 K/uL   Lymphs Abs 2.3 0.7 - 4.0 K/uL   Monocytes Absolute 0.6 0.1 - 1.0 K/uL   Eosinophils Absolute 0.1 0.0 - 0.7 K/uL   Basophils Absolute 0.0 0.0 - 0.1 K/uL   Smear Review MORPHOLOGY UNREMARKABLE   Glucose, capillary  Result Value Ref Range   Glucose-Capillary 116 (H) 65 - 99 mg/dL  Glucose, capillary  Result Value Ref Range   Glucose-Capillary 119 (H) 65 - 99 mg/dL  Glucose, capillary  Result Value Ref Range   Glucose-Capillary 125 (H) 65 - 99 mg/dL    Dg Ugi W/water Sol Cm  03/14/2015  CLINICAL DATA:  49 year old female postop day 1 laparoscopic Roux-en-Y gastric bypass for morbid obesity. Initial encounter. EXAM: UPPER GI SERIES WITH KUB TECHNIQUE: After obtaining a scout radiograph a routine upper GI series was performed using less than 50 mL water-soluble contrast FLUOROSCOPY TIME:  Radiation Exposure Index (as provided by the fluoroscopic device):  6.4mG y. If the device does not provide the exposure index: Fluoroscopy Time (in minutes and seconds):  0 minutes 54 seconds Number of Acquired Images: COMPARISON:  Preoperative upper GI series 819 2016 and earlier. FINDINGS: Preprocedural scout view of the abdomen. Staple lines in the left upper quadrant. Normal bowel gas pattern. *INSERT* coli First the patient was given a small volume of PO water which she tolerated without difficulty. Next, small sips of water soluble contrast were  observed fluoroscopically. Mild initial delay in contrast transit through the gastroesophageal junction. However, once the proximal stomach was opacified contrast quickly traversed to the gastrojejunostomy, ultimately opacifying 2 small bowel loops. No leak or obstruction. No adverse features identified. IMPRESSION: Satisfactory postoperative appearance status post Roux-en-Y gastric bypass. Electronically Signed   By: Odessa Fleming M.D.   On: 03/14/2015 10:08    Discharge Exam:  Filed Vitals:   03/15/15 0205 03/15/15 0500  BP: 125/60 117/60  Pulse: 90 98  Temp: 98 F (36.7 C) 98.7 F (37.1 C)  Resp: 16 15    General: Obese AA F who is alert and generally healthy appearing.  Lungs: Clear to auscultation and symmetric breath sounds. Heart:  RRR. No murmur or rub. Abdomen: Soft. No mass.  No hernia. Normal bowel sounds.  Incisions look good.   Discharge Medications:     Medication List    TAKE these medications        albuterol 108 (90 BASE) MCG/ACT inhaler  Commonly known as:  PROVENTIL HFA;VENTOLIN HFA  Inhale 2 puffs into the lungs 2 (two) times daily.     amLODipine 10 MG tablet  Commonly known as:  NORVASC  Take 1 tablet (10 mg total) by mouth daily.     budesonide-formoterol 160-4.5 MCG/ACT inhaler  Commonly known as:  SYMBICORT  Inhale 2 puffs into the lungs 2 (two) times daily.     buprenorphine-naloxone 8-2 MG Subl SL tablet  Commonly known as:  SUBOXONE  Place 1 tablet under the tongue 2 (two)  times daily.     famotidine 20 MG tablet  Commonly known as:  PEPCID  Take 1 tablet (20 mg total) by mouth 2 (two) times daily.     ferrous sulfate 325 (65 FE) MG tablet  Take 325 mg by mouth 3 (three) times daily with meals.     fluticasone 50 MCG/ACT nasal spray  Commonly known as:  FLONASE  Place 1 spray into both nostrils daily.     glipiZIDE 10 MG tablet  Commonly known as:  GLUCOTROL  Take 10 mg by mouth 2 (two) times daily before a meal.  Notes to Patient:  Monitor Blood Sugar Frequently and keep a log for primary care physician, you may need to adjust medication dosage with rapid weight loss.        losartan-hydrochlorothiazide 100-25 MG tablet  Commonly known as:  HYZAAR  Take 1 tablet by mouth every evening.  Notes to Patient:  Monitor Blood Pressure Daily and keep a log for primary care physician.  Monitor for symptoms of dehydration.  You may need to make changes to your medications with rapid weight loss.        metFORMIN 1000 MG tablet  Commonly known as:  GLUCOPHAGE  Take 1,000 mg by mouth 2 (two) times daily with a meal.  Notes to Patient:  Monitor Blood Sugar Frequently and keep a log for primary care physician, you may need to adjust medication dosage with rapid weight loss.        montelukast 10 MG tablet  Commonly known as:  SINGULAIR  Take 10 mg by mouth at bedtime.     multivitamin with minerals Tabs tablet  Take 1 tablet by mouth 2 (two) times daily.     omeprazole 40 MG capsule  Commonly known as:  PRILOSEC  Take 40 mg by mouth daily.     oxybutynin 10 MG 24 hr tablet  Commonly known as:  DITROPAN-XL  Take 10 mg  by mouth daily.  Notes to Patient:  This medication cannot be cut in half, crushed or chewed.  If too large to swallow discuss with prescribing doctor changing to immediate release formula     oxyCODONE-acetaminophen 7.5-325 MG tablet  Commonly known as:  PERCOCET  Take 1 tablet by mouth 5 (five) times daily.     pregabalin 75 MG  capsule  Commonly known as:  LYRICA  Take 75 mg by mouth 3 (three) times daily.     rizatriptan 10 MG tablet  Commonly known as:  MAXALT  Take 10 mg by mouth 2 (two) times daily as needed for migraine (migraine). May repeat in 2 hours if needed     sitaGLIPtin 100 MG tablet  Commonly known as:  JANUVIA  Take 100 mg by mouth daily.  Notes to Patient:  Monitor Blood Sugar Frequently and keep a log for primary care physician, you may need to adjust medication dosage with rapid weight loss.           Disposition: 01-Home or Self Care      Discharge Instructions    Ambulate hourly while awake    Complete by:  As directed      Call MD for:  difficulty breathing, headache or visual disturbances    Complete by:  As directed      Call MD for:  persistant dizziness or light-headedness    Complete by:  As directed      Call MD for:  persistant nausea and vomiting    Complete by:  As directed      Call MD for:  redness, tenderness, or signs of infection (pain, swelling, redness, odor or green/yellow discharge around incision site)    Complete by:  As directed      Call MD for:  severe uncontrolled pain    Complete by:  As directed      Call MD for:  temperature >101 F    Complete by:  As directed      Diet bariatric full liquid    Complete by:  As directed      Incentive spirometry    Complete by:  As directed   Perform hourly while awake           Follow-up Information    Follow up with [facility] H, MD. Go on 03/22/2015.   Specialty:  General Surgery   Why:  For Post-Op Check at 11:30   Contact information:   [address] [place] [address] [phone]       Follow up with [facility] H, MD. Go on 04/26/2015.   Specialty:  General Surgery   Why:  For Post-Op Check at 10:30   Contact information:   [address] [place] [address] [phone]      Activity:  Driving - May drivein 3 or 4 days, if doing well   Lifting - No lifting  more than 15 pounds for 7 days, then no limit  Wound Care:   May shower  Diet:  Post gastric bypass diet  Follow up appointment:  Call Dr. Allene Pyo office [facility]) at [phone] for an appointment in 2 weeks        Make appointment with your primary care doctor to follow your diabetes.  Medications and dosages:  Resume your home medications.  You have a prescription for:  oxycodone  Signed: Ovidio Kin, M.D., [facility] Office:  [phone]  03/15/2015, 7:52  AM

## 2015-03-15 NOTE — Discharge Instructions (Signed)
° ° ° °GASTRIC BYPASS/SLEEVE ° Home Care Instructions ° ° These instructions are to help you care for yourself when you go home. ° °Call: If you have any problems. °• Call [phone] and ask for the surgeon on call °• If you need immediate assistance come to the ER at [place]. Tell the ER staff you are a new post-op gastric bypass or gastric sleeve patient  °Signs and symptoms to report: • Severe  vomiting or nausea °o If you cannot handle clear liquids for longer than 1 day, call your surgeon °• Abdominal pain which does not get better after taking your pain medication °• Fever greater than 100.4°  F and chills °• Heart rate over 100 beats a minute °• Trouble breathing °• Chest pain °• Redness,  swelling, drainage, or foul odor at incision (surgical) sites °• If your incisions open or pull apart °• Swelling or pain in calf (lower leg) °• Diarrhea (Loose bowel movements that happen often), frequent watery, uncontrolled bowel movements °• Constipation, (no bowel movements for 3 days) if this happens: °o Take Milk of Magnesia, 2 tablespoons by mouth, 3 times a day for 2 days if needed °o Stop taking Milk of Magnesia once you have had a bowel movement °o Call your doctor if constipation continues °Or °o Take Miralax  (instead of Milk of Magnesia) following the label instructions °o Stop taking Miralax once you have had a bowel movement °o Call your doctor if constipation continues °• Anything you think is “abnormal for you” °  °Normal side effects after surgery: • Unable to sleep at night or unable to concentrate °• Irritability °• Being tearful (crying) or depressed ° °These are common complaints, possibly related to your anesthesia, stress of surgery, and change in lifestyle, that usually go away a few weeks after surgery. If these feelings continue, call your medical doctor.  °Wound Care: You may have surgical glue, steri-strips, or staples over your incisions after surgery °• Surgical glue: Looks like clear  film over your incisions and will wear off a little at a time °• Steri-strips: Adhesive strips of tape over your incisions. You may notice a yellowish color on skin under the steri-strips. This is used to make the steri-strips stick better. Do not pull the steri-strips off - let them fall off °• Staples: Staples may be removed before you leave the hospital °o If you go home with staples, call [facility] for an appointment with your surgeon’s nurse to have staples removed 10 days after surgery, [phone] °• Showering: You may shower two (2) days after your surgery unless your surgeon tells you differently °o Wash gently around incisions with warm soapy water, rinse well, and gently pat dry °o If you have a drain (tube from your incision), you may need someone to hold this while you shower °o No tub baths until staples are removed and incisions are healed °  °Medications: • Medications should be liquid or crushed if larger than the size of a dime °• Extended release pills (medication that releases a little bit at a time through the  day) should not be crushed °• Depending on the size and number of medications you take, you may need to space (take a few throughout the day)/change the time you take your medications so that you do not over-fill your pouch (smaller stomach) °• Make sure you follow-up with you primary care physician to make medication changes needed during rapid weight loss and life -style changes °•   If you have diabetes, follow up with your doctor that orders your diabetes medication(s) within one week after surgery and check your blood sugar regularly ° °• Do not drive while taking narcotics (pain medications) ° °• Do not take acetaminophen (Tylenol) and Roxicet or Lortab Elixir at the same time since these pain medications contain acetaminophen °  °Diet:  °First 2 Weeks You will see the nutritionist about two (2) weeks after your surgery. The nutritionist will increase the types of  foods you can eat if you are handling liquids well: °• If you have severe vomiting or nausea and cannot handle clear liquids lasting longer than 1 day call your surgeon °Protein Shake °• Drink at least 2 ounces of shake 5-6 times per day °• Each serving of protein shakes (usually 8-12 ounces) should have a minimum of: °o 15 grams of protein °o And no more than 5 grams of carbohydrate °• Goal for protein each day: °o Men = 80 grams per day °o Women = 60 grams per day °  ° • Protein powder may be added to fluids such as non-fat milk or Lactaid milk or Soy milk (limit to 35 grams added protein powder per serving) ° °Hydration °• Slowly increase the amount of water and other clear liquids as tolerated (See Acceptable Fluids) °• Slowly increase the amount of protein shake as tolerated °• Sip fluids slowly and throughout the day °• May use sugar substitutes in small amounts (no more than 6-8 packets per day; i.e. Splenda) ° °Fluid Goal °• The first goal is to drink at least 8 ounces of protein shake/drink per day (or as directed by the nutritionist); some examples of protein shakes are Syntrax Nectar, Adkins Advantage, EAS Edge HP, and Unjury. - See handout from pre-op Bariatric Education Class: °o Slowly increase the amount of protein shake you drink as tolerated °o You may find it easier to slowly sip shakes throughout the day °o It is important to get your proteins in first °• Your fluid goal is to drink 64-100 ounces of fluid daily °o It may take a few weeks to build up to this  °• 32 oz. (or more) should be clear liquids °And °• 32 oz. (or more) should be full liquids (see below for examples) °• Liquids should not contain sugar, caffeine, or carbonation ° °Clear Liquids: °• Water of Sugar-free flavored water (i.e. Fruit H²O, Propel) °• Decaffeinated coffee or tea (sugar-free) °• Crystal lite, Wyler’s Lite, Minute Maid Lite °• Sugar-free Jell-O °• Bouillon or broth °• Sugar-free Popsicle:    - Less than 20 calories  each; Limit 1 per day ° °Full Liquids: °                  Protein Shakes/Drinks + 2 choices per day of other full liquids °• Full liquids must be: °o No More Than 12 grams of Carbs per serving °o No More Than 3 grams of Fat per serving °• Strained low-fat cream soup °• Non-Fat milk °• Fat-free Lactaid Milk °• Sugar-free yogurt (Dannon Lite & Fit, Greek yogurt) ° °  °Vitamins and Minerals • Start 1 day after surgery unless otherwise directed by your surgeon °• 2 Chewable Multivitamin / Multimineral Supplement with iron (i.e. Centrum for Adults) °• Vitamin B-12, 350-500 micrograms sub-lingual (place tablet under the tongue) each day °• Chewable Calcium Citrate with Vitamin D-3 °(Example: 3 Chewable Calcium  Plus 600 with Vitamin D-3) °o Take 500 mg three (3) times a day for   a total of 1500 mg each day °o Do not take all 3 doses of calcium at one time as it may cause constipation, and you can only absorb 500 mg at a time °o Do not mix multivitamins containing iron with calcium supplements;  take 2 hours apart °o Do not substitute Tums (calcium carbonate) for your calcium °• Menstruating women and those at risk for anemia ( a blood disease that causes weakness) may need extra iron °o Talk to your doctor to see if you need more iron °• If you need extra iron: Total daily Iron recommendation (including Vitamins) is 50 to 100 mg Iron/day °• Do not stop taking or change any vitamins or minerals until you talk to your nutritionist or surgeon °• Your nutritionist and/or surgeon must approve all vitamin and mineral supplements °  °Activity and Exercise: It is important to continue walking at home. Limit your physical activity as instructed by your doctor. During this time, use these guidelines: °• Do not lift anything greater than ten  (10) pounds for at least two (2) weeks °• Do not go back to work or drive until your surgeon says you can °• You may have sex when you feel comfortable °o It is VERY important for female  patients to use a reliable birth control method; fertility often increase after surgery °o Do not get pregnant for at least 18 months °• Start exercising as soon as your doctor tells you that you can °o Make sure your doctor approves any physical activity °• Start with a simple walking program °• Walk 5-15 minutes each day, 7 days per week °• Slowly increase until you are walking 30-45 minutes per day °• Consider joining our BELT program. [phone] or email [email or web address] °  °Special Instructions Things to remember: °• Free counseling is available for you and your family through collaboration between [place] [facility] and [employer]. Please call [phone] and leave a message °• Use your CPAP when sleeping if this applies to you °• Consider buying a medical alert bracelet that says you had lap-band surgery °  °  You will likely have your first fill (fluid added to your band) 6 - 8 weeks after surgery °• [facility] has a free Bariatric Surgery Support Group that meets monthly, the 3rd Thursday, 6pm. [facility] Classrooms. You can see classes online at [email or web address] °• It is very important to keep all follow up appointments with your surgeon, nutritionist, primary care physician, and behavioral health practitioner °o After the first year, please follow up with your bariatric surgeon and nutritionist at least once a year in order to maintain best weight loss results °      °             [facility]:  [phone] ° °             [facility]: [phone] ° °             Bariatric Nurse Coordinator: [phone]  °Gastric Bypass/Sleeve Home Care Instructions  Rev. 05/2012    ° °                                                    Reviewed and Endorsed °                                                     by [facility] Patient Education Committee, Jan, 2014    [facility] - DISCHARGE INSTRUCTIONS TO  PATIENT  Activity:  Driving - May drivein 3 or 4 days, if doing well   Lifting - No lifting more than 15 pounds for 7 days, then no limit  Wound Care:   May shower  Diet:  Post gastric bypass diet  Follow up appointment:  Call Dr. Allene PyoNewman's office [facility]) at [phone] for an appointment in 2 weeks        Make appointment with your primary care doctor to follow your diabetes.  Medications and dosages:  Resume your home medications.  You have a prescription for:  oxycodone  Call Dr. Ezzard StandingNewman or his office  [phone]) if you have:  Temperature greater than 100.4,  Persistent nausea and vomiting,  Severe uncontrolled pain,  Redness, tenderness, or signs of infection (pain, swelling, redness, odor or green/yellow discharge around the site),  Difficulty breathing, headache or visual disturbances,  Any other questions or concerns you may have after discharge.  In an emergency, call 911 or go to an Emergency Department at a nearby hospital.

## 2015-03-15 NOTE — Progress Notes (Signed)
Patient alert and oriented, pain is controlled. Patient is tolerating fluids,  advanced to protein shake today, patient tolerated well. Reviewed Gastric Bypass discharge instructions with patient and patient is able to articulate understanding. Provided information on BELT program, Support Group and [facility] outpatient pharmacy. All questions answered, will continue to monitor.    

## 2015-03-22 ENCOUNTER — Telehealth (HOSPITAL_COMMUNITY): Payer: Self-pay

## 2015-03-22 NOTE — Telephone Encounter (Signed)
Attempted DROP discharge call, unable to leave message, will try again    Made discharge phone call to patient per DROP protocol. Asking the following questions.    1. Do you have someone to care for you now that you are home?   2. Are you having pain now that is not relieved by your pain medication?   3. Are you able to drink the recommended daily amount of fluids (48 ounces minimum/day) and protein (60-80 grams/day) as prescribed by the dietitian or nutritional counselor?   4. Are you taking the vitamins and minerals as prescribed?   5. Do you have the "on call" number to contact your surgeon if you have a problem or question?   6. Are your incisions free of redness, swelling or drainage? (If steri strips, address that these can fall off, shower as tolerated)  7. Have your bowels moved since your surgery?  If not, are you passing gas?   8. Are you up and walking 3-4 times per day?

## 2015-03-27 ENCOUNTER — Encounter: Payer: Medicare Other | Attending: Surgery

## 2015-03-27 DIAGNOSIS — Z6841 Body Mass Index (BMI) 40.0 and over, adult: Secondary | ICD-10-CM | POA: Insufficient documentation

## 2015-03-27 DIAGNOSIS — Z713 Dietary counseling and surveillance: Secondary | ICD-10-CM | POA: Diagnosis not present

## 2015-03-27 DIAGNOSIS — Z01818 Encounter for other preprocedural examination: Secondary | ICD-10-CM | POA: Insufficient documentation

## 2015-03-27 NOTE — Progress Notes (Signed)
Bariatric Class:  Appt start time: 1530 end time:  1630.  2 Week Post-Operative Nutrition Class  Patient was seen on 03/27/2015 for Post-Operative Nutrition education at [facility].   Surgery date: 01/23/15 Surgery type: RYGB Start weight at [facility]: 367 lbs on 10/30/14 Weight today: 322.5 lbs Weight change: 44.5 lbs  TANITA  BODY COMP RESULTS  01/01/15 03/27/15   BMI (kg/m^2) N/A 52.1   Fat Mass (lbs)  183   Fat Free Mass (lbs)  139.5   Total Body Water (lbs)  102    The following the learning objectives were met by the patient during this course:  Identifies Phase 3A (Soft, High Proteins) Dietary Goals and will begin from 2 weeks post-operatively to 2 months post-operatively  Identifies appropriate sources of fluids and proteins   States protein recommendations and appropriate sources post-operatively  Identifies the need for appropriate texture modifications, mastication, and bite sizes when consuming solids  Identifies appropriate multivitamin and calcium sources post-operatively  Describes the need for physical activity post-operatively and will follow MD recommendations  States when to call healthcare provider regarding medication questions or post-operative complications  Handouts given during class include:  Phase 3A: Soft, High Protein Diet Handout  Band Fill Guidelines Handout  Follow-Up Plan: Patient will follow-up at [facility] in 4 weeks for 6 week post-op nutrition visit for diet advancement per MD.     

## 2015-05-08 ENCOUNTER — Encounter: Payer: Self-pay | Admitting: Dietician

## 2015-05-08 ENCOUNTER — Encounter: Payer: Medicare Other | Attending: Surgery | Admitting: Dietician

## 2015-05-08 DIAGNOSIS — Z6841 Body Mass Index (BMI) 40.0 and over, adult: Secondary | ICD-10-CM | POA: Insufficient documentation

## 2015-05-08 DIAGNOSIS — Z01818 Encounter for other preprocedural examination: Secondary | ICD-10-CM | POA: Diagnosis not present

## 2015-05-08 DIAGNOSIS — Z713 Dietary counseling and surveillance: Secondary | ICD-10-CM | POA: Diagnosis not present

## 2015-05-08 NOTE — Patient Instructions (Addendum)
Goals:  Follow Phase 3B: High Protein + Non-Starchy Vegetables  Eat 3-6 small meals/snacks, every 3-5 hrs  Increase lean protein foods to meet 60g goal  Seafood, beans, ground chicken, [place], yogurt, cheese, deli meat or ham cubes, eggs, tuna salad  Add protein snacks between meals (try eating 4x per day)  Try mixing protein shake with skim milk (aim to drink 1 full shake per day)  Increase fluid intake to 64oz +  Avoid drinking 15 minutes before, during and 30 minutes after eating  Aim for >30 min of physical activity daily  Surgery date: 01/23/15 Surgery type: RYGB Start weight at [facility]: 367 lbs on 10/30/14 Weight today: 305 lbs Weight change: 17.5 lbs Total weight lost: 62 lbs  TANITA  BODY COMP RESULTS  01/01/15 03/27/15 05/08/15   BMI (kg/m^2) N/A 52.1 49.3   Fat Mass (lbs)  183 169   Fat Free Mass (lbs)  139.5 136.5   Total Body Water (lbs)  102 100

## 2015-05-08 NOTE — Progress Notes (Signed)
  Follow-up visit:  8 Weeks Post-Operative RYGB Surgery  Medical Nutrition Therapy:  Appt start time: 1140 end time:  1210.  Primary concerns today: Post-operative Bariatric Surgery Nutrition Management. Gena returns having lost another 17.5 lbs. She reports vomiting with beef and sometimes chicken. Can tolerate ground chicken and [place]. Tolerates seafood well, especially crabs, mussels, and clams. She states, "I don't get hungry" and recognizes that she is not eating enough. Finds protein shakes too sweet.    Surgery date: 03/13/15 Surgery type: RYGB Start weight at [facility]: 367 lbs on 10/30/14 Weight today: 305 lbs Weight change: 17.5 lbs Total weight lost: 62 lbs  TANITA  BODY COMP RESULTS  01/01/15 03/27/15 05/08/15   BMI (kg/m^2) N/A 52.1 49.3   Fat Mass (lbs)  183 169   Fat Free Mass (lbs)  139.5 136.5   Total Body Water (lbs)  102 100   Preferred Learning Style:   No preference indicated   Learning Readiness:   Ready  24-hr recall: B (8-10 AM): 1/2 Premier protein shake or Activia yogurt (4-15g) Snk (AM):   L (PM): string cheese and deli meat (14g) Snk (PM):   D (PM): 1-2 oz seafood (7-14g) Snk (PM):   Fluid intake: 51 oz water with sugar free flavoring + 5-6 oz protein shake Estimated total protein intake: 36-43g/day  Medications: see list; "no longer on diabetes medication" Supplementation: taking  CBG monitoring: 2-3x per day Average CBG per patient: below 112 mg/dL Last patient reported Z6X: none since before surgery  Using straws: no Drinking while eating: no Hair loss: none Carbonated beverages: no N/V/D/C: vomits with meat, constipation Dumping syndrome: none  Recent physical activity:  Walking, plans to start Silver Sneakers at [employer]  Progress Towards Goal(s):  In progress.  Handouts given during visit include:  Phase 3B lean protein + non starchy vegetables   Nutritional Diagnosis:  [place]-3.3 Overweight/obesity related to past poor dietary habits  and physical inactivity as evidenced by patient w/ recent RYGB surgery following dietary guidelines for continued weight loss.     Intervention:  Nutrition counseling provided.  Teaching Method Utilized:  Visual Auditory Hands on  Barriers to learning/adherence to lifestyle change: none  Demonstrated degree of understanding via:  Teach Back   Monitoring/Evaluation:  Dietary intake, exercise, and body weight. Follow up in 4-6 weeks for 3.5 month post-op visit.

## 2015-06-26 ENCOUNTER — Encounter: Payer: Medicare Other | Attending: Surgery | Admitting: Dietician

## 2015-06-26 ENCOUNTER — Encounter: Payer: Self-pay | Admitting: Dietician

## 2015-06-26 DIAGNOSIS — Z01818 Encounter for other preprocedural examination: Secondary | ICD-10-CM | POA: Insufficient documentation

## 2015-06-26 DIAGNOSIS — Z713 Dietary counseling and surveillance: Secondary | ICD-10-CM | POA: Diagnosis not present

## 2015-06-26 DIAGNOSIS — Z6841 Body Mass Index (BMI) 40.0 and over, adult: Secondary | ICD-10-CM | POA: Diagnosis not present

## 2015-06-26 NOTE — Progress Notes (Signed)
  Follow-up visit:  3 months Post-Operative RYGB Surgery  Medical Nutrition Therapy:  Appt start time: 255 end time:  345  Primary concerns today: Post-operative Bariatric Surgery Nutrition Management. Angelica Ortiz returns having lost another 20 lbs. She is excited about her weight loss and being able to move more. Still having trouble tolerating meat except seafood and bacon. Gets a stuck feeling followed by vomiting when she eats meat. Tolerates yogurt and cheese. Does not like eggs anymore.   Samples provided and patient instructed on proper use: Unjury protein powder (qty 3 - unflavored): Lot#: [id number] Exp: 10/2016  Unjury protein powder (qty 3 - chicken soup): Lot#: [id number] Exp: 11/2016  Surgery date: 03/13/15 Surgery type: RYGB [facility] lbs on 10/30/14 (highest weight 460 lbs) Weight today: 285 lbs Weight change: 20 lbs Total weight lost: 82 lbs (175 lbs total!)  TANITA  BODY COMP RESULTS  01/01/15 03/27/15 05/08/15 06/26/15   BMI (kg/m^2) N/A 52.1 49.3 46   Fat Mass (lbs)  183 169 153.5   Fat Free Mass (lbs)  139.5 136.5 131.5   Total Body Water (lbs)  102 100 96.5   Preferred Learning Style:   No preference indicated   Learning Readiness:   Ready  24-hr recall: B (8-10 AM): 3 strips bacon or Activia yogurt (4-7g) Snk (AM):   L (PM): 1-2 oz fish with vegetables or salad with tunafish (7-14g) Snk (PM): 0.5-1 EAS [employer] protein shake (8-17g) D (PM): 1-2 oz seafood with vegetables or split pea soup (7-14g) Snk (PM):   Fluid intake: 64 oz water with sugar free flavoring + 6-11 oz protein shake Estimated total protein intake: 36-43g/day  Medications: see list; "no longer on diabetes medication" and taking less blood pressure medication and narcotics Supplementation: taking  CBG monitoring: 3x per day Average CBG per patient: 85-100 mg/dL Last patient reported B1YA1c: 6.3%  Using straws: no Drinking while eating: no Hair loss: none, taking Biotin Carbonated  beverages: no N/V/D/C: vomits with meat, constipation, takes Miralax and stool softeners Dumping syndrome: none  Recent physical activity:  Water aerobics, walking and machines, Silver Sneakers for at least an hour (5 days a week total)  Progress Towards Goal(s):  In progress.  Handouts given during visit include:  none   Nutritional Diagnosis:  [place]-3.3 Overweight/obesity related to past poor dietary habits and physical inactivity as evidenced by patient w/ recent RYGB surgery following dietary guidelines for continued weight loss.     Intervention:  Nutrition counseling provided. Goals:  Follow Phase 3B: High Protein + Non-Starchy Vegetables  Eat 3-6 small meals/snacks, every 3-5 hrs  Increase lean protein foods to meet 60g goal  Seafood, beans, ground chicken, [place], yogurt, cheese, deli meat or ham cubes, eggs, tuna salad  Add protein snacks between meals (try eating 4x per day)  Increase fluid intake to 64oz +  Avoid drinking 15 minutes before, during and 30 minutes after eating  Aim for >30 min of physical activity daily  Try mixing Lactaid or unsweetened almond milk with protein shake  Try mixing protein powder with yogurt and Crystal Light and soup  Try taking a Magnesium supplement with 1 of your Calcium supplements   Teaching Method Utilized:  Visual Auditory Hands on  Barriers to learning/adherence to lifestyle change: none  Demonstrated degree of understanding via:  Teach Back   Monitoring/Evaluation:  Dietary intake, exercise, and body weight. Follow up in 8 weeks for 5 month post-op visit.

## 2015-06-26 NOTE — Patient Instructions (Addendum)
Goals:  Follow Phase 3B: High Protein + Non-Starchy Vegetables  Eat 3-6 small meals/snacks, every 3-5 hrs  Increase lean protein foods to meet 60g goal  Seafood, beans, ground chicken, [place], yogurt, cheese, deli meat or ham cubes, eggs, tuna salad  Add protein snacks between meals (try eating 4x per day)  Increase fluid intake to 64oz +  Avoid drinking 15 minutes before, during and 30 minutes after eating  Aim for >30 min of physical activity daily  Try mixing Lactaid or unsweetened almond milk with protein shake  Try mixing protein powder with yogurt and Crystal Light and soup  Try taking a Magnesium supplement with 1 of your Calcium supplements  Surgery date: 03/13/15 Surgery [facility]GB [facility] on 10/30/14 (highest weight 460 lbs) Weight today: 285 lbs Weight change: 20 lbs Total weight lost: 82 lbs (175 lbs total!)  TANITA  BODY COMP RESULTS  01/01/15 03/27/15 05/08/15 06/26/15   BMI (kg/m^2) N/A 52.1 49.3 46   Fat Mass (lbs)  183 169 153.5   Fat Free Mass (lbs)  139.5 136.5 131.5   Total Body Water (lbs)  102 100 96.5

## 2015-08-27 ENCOUNTER — Encounter: Payer: Medicare Other | Attending: Surgery | Admitting: Dietician

## 2015-08-27 ENCOUNTER — Encounter: Payer: Self-pay | Admitting: Dietician

## 2015-08-27 DIAGNOSIS — Z6841 Body Mass Index (BMI) 40.0 and over, adult: Secondary | ICD-10-CM | POA: Diagnosis not present

## 2015-08-27 DIAGNOSIS — Z713 Dietary counseling and surveillance: Secondary | ICD-10-CM | POA: Diagnosis not present

## 2015-08-27 DIAGNOSIS — Z01818 Encounter for other preprocedural examination: Secondary | ICD-10-CM | POA: Insufficient documentation

## 2015-08-27 NOTE — Progress Notes (Signed)
  Follow-up visit:  5.5 months Post-Operative RYGB Surgery  Medical Nutrition Therapy:  Appt start time: 215 end time:  300  Primary concerns today: Post-operative Bariatric Surgery Nutrition Management. Angelica Ortiz returns having lost another 14 lbs. She states she is very proud of her weight loss! Able to move more and has less back pain. Still struggling with constipation. Unable to tolerate chicken and beef. Tolerates seafood, cheese, yogurt, vegetables, soups, beans. Eats mussels every day. Still does not like eggs or deli meat. Has vomiting occasionally with "food that's not on my list." Not feeling hungry and goes several hours without eating. Not meeting protein needs consistently and no longer likes protein shakes. However, she has been adding Unjury protein powder to soups.    Surgery date: 03/13/15 Surgery type[facility]GB [facility] 10/30/14 (highest weight 460 lbs) Weight today: 271 lbs Weight change: 14 lbs Total weight lost: 96 lbs (189 lbs total!) Weight loss goal: 180 lbs  TANITA  BODY COMP RESULTS  01/01/15 03/27/15 05/08/15 06/26/15 08/27/15   BMI (kg/m^2) N/A 52.1 49.3 46 43.7   Fat Mass (lbs)  183 169 153.5 138   Fat Free Mass (lbs)  139.5 136.5 131.5 133   Total Body Water (lbs)  102 100 96.5 98.2   Preferred Learning Style:   No preference indicated   Learning Readiness:   Ready  24-hr recall: B (7 AM): 3 strips bacon and 100-cal guacamole (5g) Snk (AM):   L (4-5 PM): cream soup with Unjury protein powder or yogurt and cheese stick (14g) Snk (PM): D (PM): 2-3 oz mussels (14-21g) Snk (PM):   Fluid intake: 64 oz water with sugar free flavoring  Estimated total protein intake: 36-43g/day  Medications: see list; "no longer on diabetes medication" and taking less blood pressure medication and narcotics Supplementation: taking  CBG monitoring: 3x per day Average CBG per patient: 85-100 mg/dL Last patient reported Y8MA1c: 6.3%  Using straws: no Drinking  while eating: no Hair loss: none, taking Biotin Carbonated beverages: no N/V/D/C: vomits with meat, constipation, takes Miralax and stool softeners Dumping syndrome: none  Recent physical activity:  Water aerobics, walking and machines, Silver Sneakers for at least an hour (5 days a week total)  Progress Towards Goal(s):  In progress.  Handouts given during visit include:  none   Nutritional Diagnosis:  [place]-3.3 Overweight/obesity related to past poor dietary habits and physical inactivity as evidenced by patient w/ recent RYGB surgery following dietary guidelines for continued weight loss.     Intervention:  Nutrition counseling provided.   Teaching Method Utilized:  Visual Auditory Hands on  Barriers to learning/adherence to lifestyle change: none  Demonstrated degree of understanding via:  Teach Back   Monitoring/Evaluation:  Dietary intake, exercise, and body weight. Follow up in 8 weeks for 7.5 month post-op visit.

## 2015-08-27 NOTE — Patient Instructions (Addendum)
Goals:  Follow Phase 3B: High Protein + Non-Starchy Vegetables  Eat 3-6 small meals/snacks, every 3-5 hrs  Increase lean protein foods to meet 60g goal  Seafood, beans, ground chicken, [place], yogurt, cheese, deli meat or ham cubes, eggs, tuna salad  Add protein snacks between meals (try eating 4x per day)  Increase fluid intake to 64oz +  Avoid drinking 15 minutes before, during and 30 minutes after eating  Aim for >30 min of physical activity daily  Try mixing Lactaid or unsweetened almond milk with protein shake  Try mixing protein powder with yogurt and Crystal Light and soup  Work on adding another protein food mid morning  Try taking a 400-600 mg Magnesium supplement with 1 of your Calcium supplements  Surgery date: 03/13/15 Surgery type: [facility]YGB [facility] on 10/30/14 (highest weight 460 lbs) Weight today: 271 lbs Weight change: 14 lbs Total weight lost: 96 lbs (189 lbs total!)  TANITA  BODY COMP RESULTS  01/01/15 03/27/15 05/08/15 06/26/15 08/27/15   BMI (kg/m^2) N/A 52.1 49.3 46 43.7   Fat Mass (lbs)  183 169 153.5 138   Fat Free Mass (lbs)  139.5 136.5 131.5 133   Total Body Water (lbs)  102 100 96.5 98.2

## 2015-10-29 ENCOUNTER — Encounter: Payer: Medicare Other | Attending: Surgery | Admitting: Dietician

## 2015-10-29 ENCOUNTER — Encounter: Payer: Self-pay | Admitting: Dietician

## 2015-10-29 DIAGNOSIS — Z6841 Body Mass Index (BMI) 40.0 and over, adult: Secondary | ICD-10-CM | POA: Insufficient documentation

## 2015-10-29 DIAGNOSIS — Z01818 Encounter for other preprocedural examination: Secondary | ICD-10-CM | POA: Insufficient documentation

## 2015-10-29 DIAGNOSIS — Z713 Dietary counseling and surveillance: Secondary | ICD-10-CM | POA: Insufficient documentation

## 2015-10-29 DIAGNOSIS — E119 Type 2 diabetes mellitus without complications: Secondary | ICD-10-CM

## 2015-10-29 NOTE — Progress Notes (Signed)
  Follow-up visit:  7.5 months Post-Operative RYGB Surgery  Medical Nutrition Therapy:  Appt start time: 215 end time:  245  Primary concerns today: Post-operative Bariatric Surgery Nutrition Management. Angelica Ortiz returns having lost a total of 200 lbs overall! Has been on oral steroid recently and has noticed increased appetite and fluid gain. Feels like protein intake is improving but struggling to meet fluid needs. She states that she sometimes has swelling in her lower extremities and plans to discuss a diuretic with her PCP. Having less vomiting.  Struggling with rawness and skin breakdown in abdominal folds as well as underarms and under breasts. Showering often and using A&D ointment cornstarch to help stay dry.   Surgery date: 03/13/15 Surgery type: RYGB Start weight at [facility]: 367 lbs on 10/30/14 (highest weight 460 lbs) Weight today: 261.8 lbs Weight change: 9.2 lbs (16.2 lbs fat) Total weight lost: 106 lbs (200 lbs total!) Weight loss goal: 180 lbs  TANITA  BODY COMP RESULTS  01/01/15 03/27/15 05/08/15 06/26/15 08/27/15 10/29/15   BMI (kg/m^2) N/A 52.1 49.3 46 43.7 42.3   Fat Mass (lbs)  183 169 153.5 138 121.8   Fat Free Mass (lbs)  139.5 136.5 131.5 133 140   Total Body Water (lbs)  102 100 96.5 98.2 102.6   Preferred Learning Style:   No preference indicated   Learning Readiness:   Ready  24-hr recall: B (7 AM): 2-3 slices bacon and Activia Light yogurt (10g) Snk (AM):   L (4-5 PM): [place] [employer] OR 1/2 oz crab meat and egg drop soup with added protein powder (10-17g) Snk (PM): D (PM): salad with cheese (7g) Snk (8-9PM): cheese stick (7g)   Fluid intake: 32 oz water with sugar free flavoring  Estimated total protein intake: 36-43g/day  Medications: see list; "no longer on diabetes medication" and taking less blood pressure medication and narcotics Supplementation: taking  CBG monitoring: not testing Average CBG per patient:  Last patient reported A1c: "less than 6"  per patient  Using straws: no Drinking while eating: no Hair loss: none, taking Biotin Carbonated beverages: no N/V/D/C: constipation, takes Miralax, Benefiber, and stool softeners Dumping syndrome: none  Recent physical activity:  Water aerobics, walking and machines, Silver Sneakers for at least an hour (5 days a week most weeks)  Progress Towards Goal(s):  In progress.  Handouts given during visit include:  none   Nutritional Diagnosis:  [place]-3.3 Overweight/obesity related to past poor dietary habits and physical inactivity as evidenced by patient w/ recent RYGB surgery following dietary guidelines for continued weight loss.     Intervention:  Nutrition counseling provided.   Teaching Method Utilized:  Visual Auditory Hands on  Barriers to learning/adherence to lifestyle change: none  Demonstrated degree of understanding via:  Teach Back   Monitoring/Evaluation:  Dietary intake, exercise, and body weight. Follow up in 8 weeks for 9.5 month post-op visit.

## 2015-10-29 NOTE — Patient Instructions (Addendum)
Goals:  Follow Phase 3B: High Protein + Non-Starchy Vegetables  Eat 3-6 small meals/snacks, every 3-5 hrs  Increase lean protein foods to meet 60g goal  Seafood, beans, ground chicken, [place], yogurt, cheese, deli meat or ham cubes, eggs, tuna salad  Add protein snacks between meals (try eating 4x per day)  Increase fluid intake to 64oz +  Avoid drinking 15 minutes before, during and 30 minutes after eating  Aim for >30 min of physical activity daily  Try mixing Lactaid or unsweetened almond milk with protein shake  Try mixing protein powder with yogurt and Crystal Light and soup  Work on adding another protein food mid morning  Try taking a 400-600 mg Magnesium supplement with 1 of your Calcium supplements  Work on increasing fluid intake  Surgery date: 03/13/15 Surgery type: RYGB Start weight at [facility]: 367 lbs on 10/30/14 (highest weight 460 lbs) Weight today: 261.8 lbs Weight change: 9.2 lbs (16.2 lbs fat) Total weight lost: 106 lbs (200 lbs total!) Weight loss goal: 180 lbs  TANITA  BODY COMP RESULTS  01/01/15 03/27/15 05/08/15 06/26/15 08/27/15 10/29/15   BMI (kg/m^2) N/A 52.1 49.3 46 43.7 42.3   Fat Mass (lbs)  183 169 153.5 138 121.8   Fat Free Mass (lbs)  139.5 136.5 131.5 133 140   Total Body Water (lbs)  102 100 96.5 98.2 102.6

## 2015-11-27 IMAGING — CT CT ABD-PELV W/ CM
1 of 3 series · 14 of 32 positions shown, 19 images · IV contrast (OMNIPAQUE)
Comparison: Ultrasound pelvis 07/14/2014. CT abdomen and pelvis
06/28/2014.

CLINICAL DATA: Vaginal bleeding this morning with severe left lower
quadrant pain.

EXAM:
CT ABDOMEN AND PELVIS WITH CONTRAST
TECHNIQUE: Multidetector CT imaging of the abdomen and pelvis was performed
using the standard protocol following bolus administration of
intravenous contrast.
CONTRAST:  100mL OMNIPAQUE IOHEXOL 300 MG/ML  SOLN

[Series 2: routine abdomen/pelvis with · axial · 0.88mm/px · z∈[+631,+1041]mm · 14 of 94 slices shown, 19 images]
[im 6/94  soft-tissue]
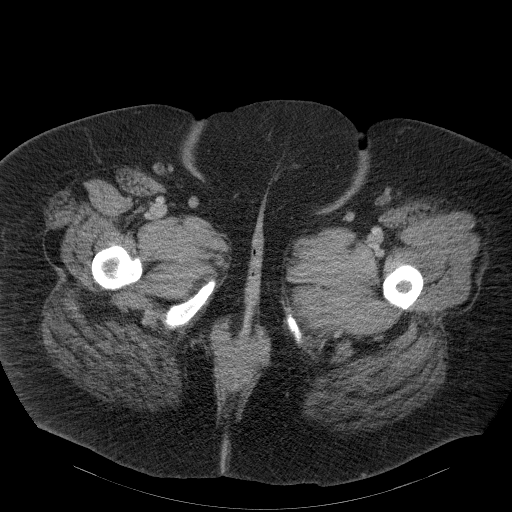
[im 6/94  bone]
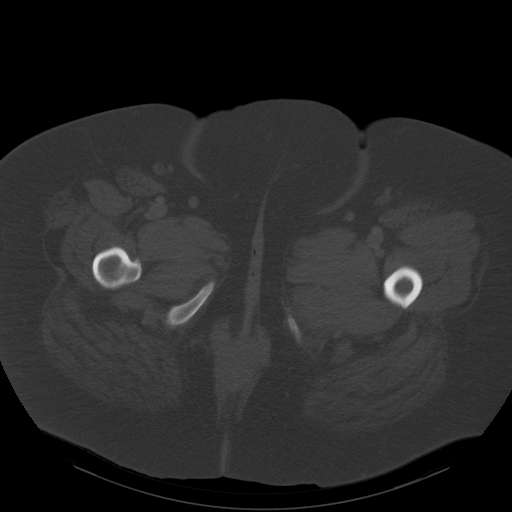
[im 11/94  soft-tissue]
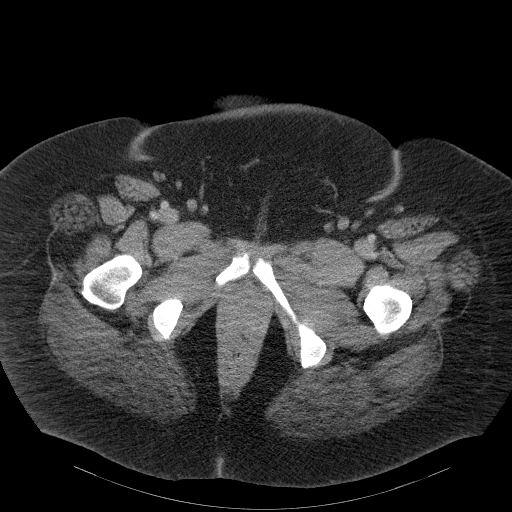
[im 22/94  soft-tissue]
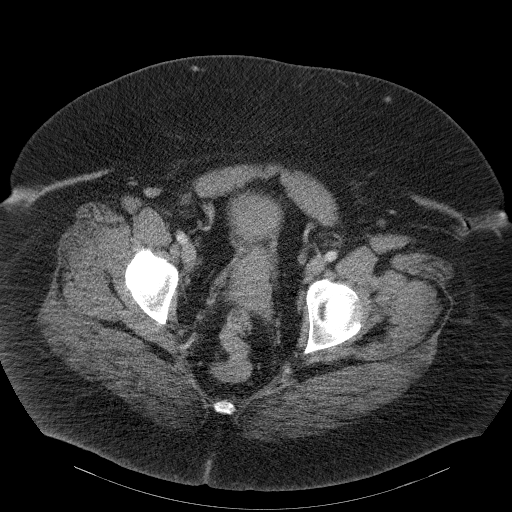
[im 28/94  soft-tissue]
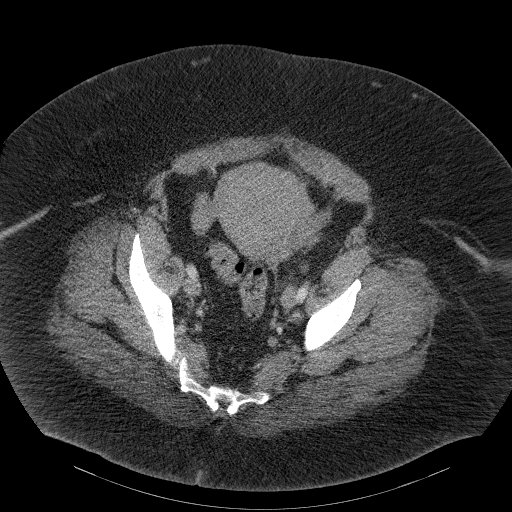
[im 33/94  soft-tissue]
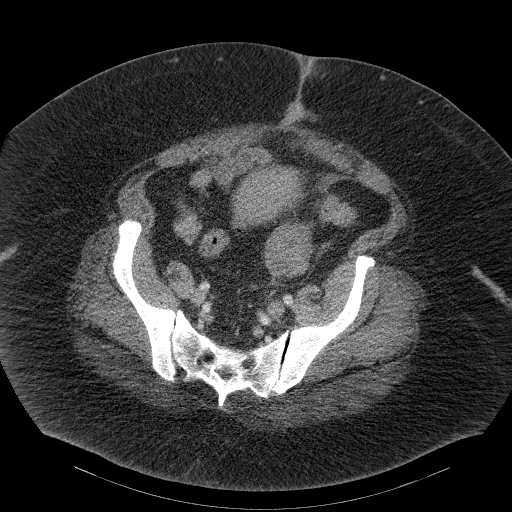
[im 39/94  soft-tissue]
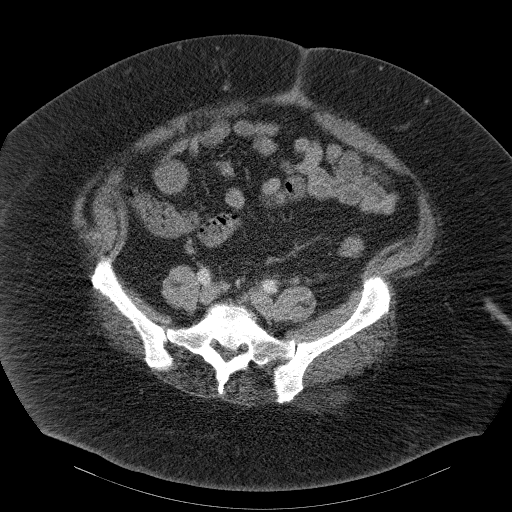
[im 50/94  soft-tissue]
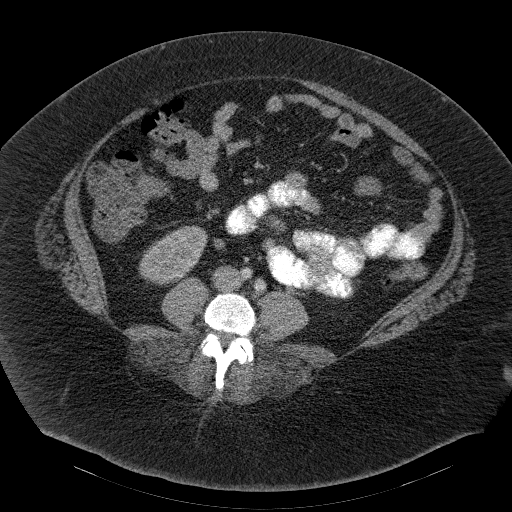
[im 55/94  soft-tissue]
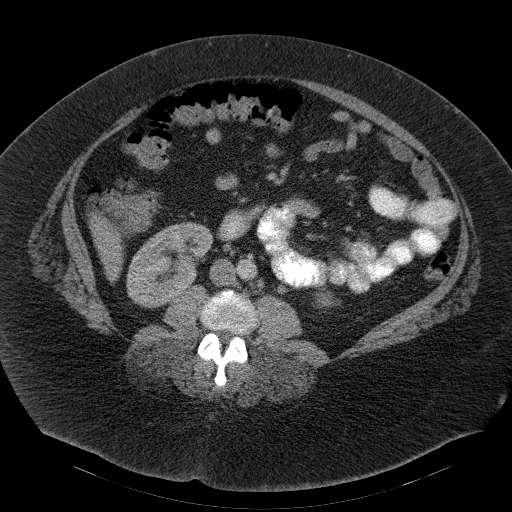
[im 61/94  soft-tissue]
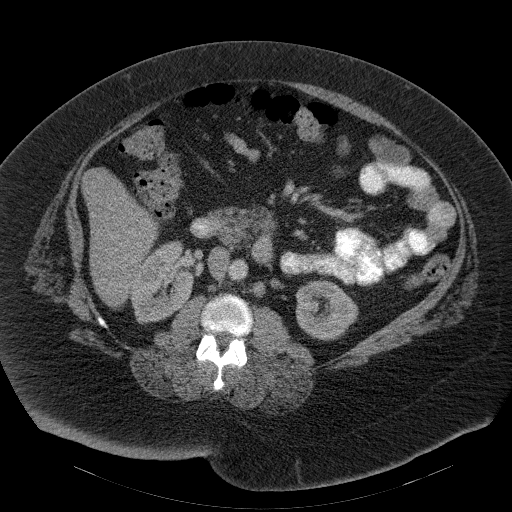
[im 61/94  bone]
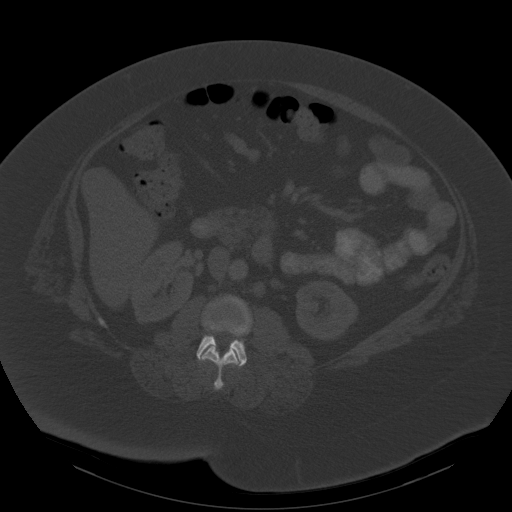
[im 66/94  soft-tissue]
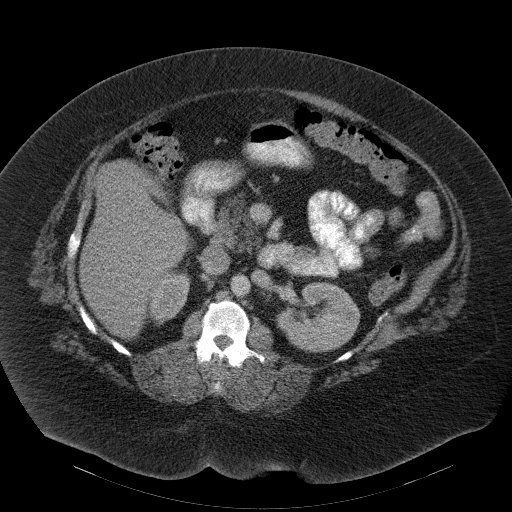
[im 72/94  soft-tissue]
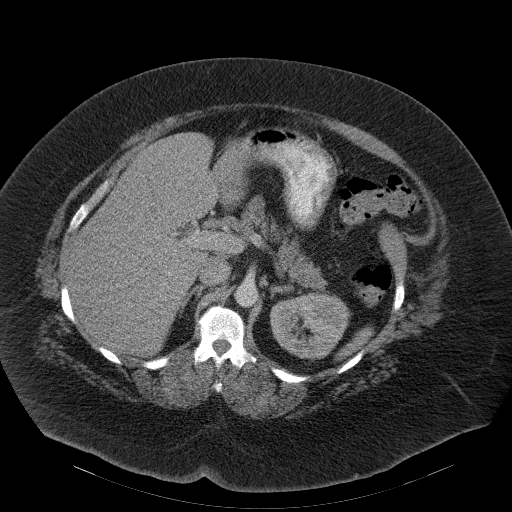
[im 72/94  lung]
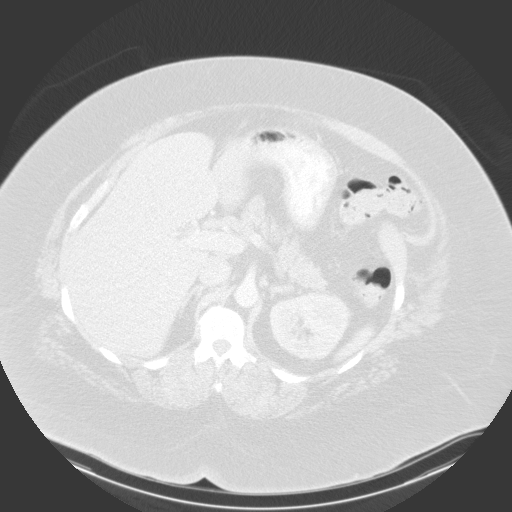
[im 77/94  lung]
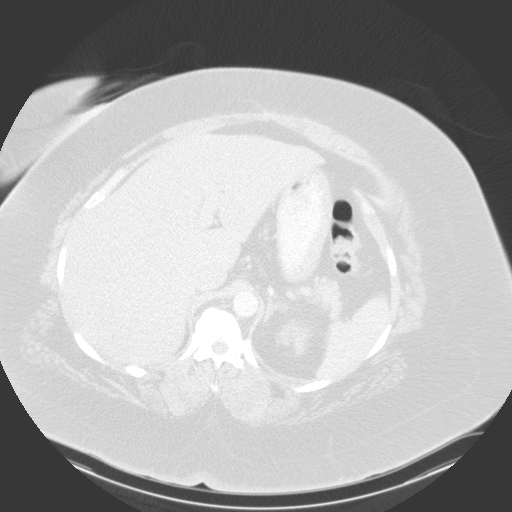
[im 83/94  soft-tissue]
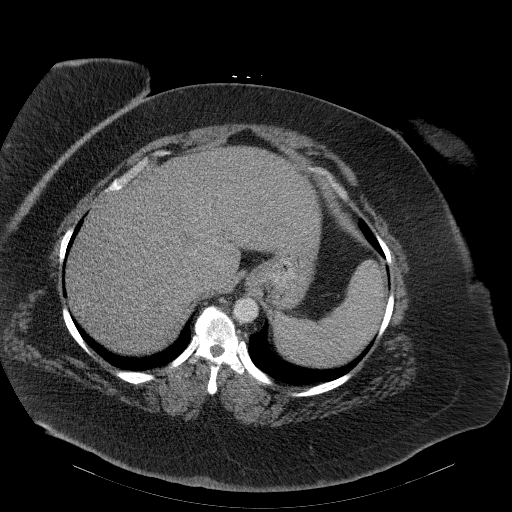
[im 83/94  lung]
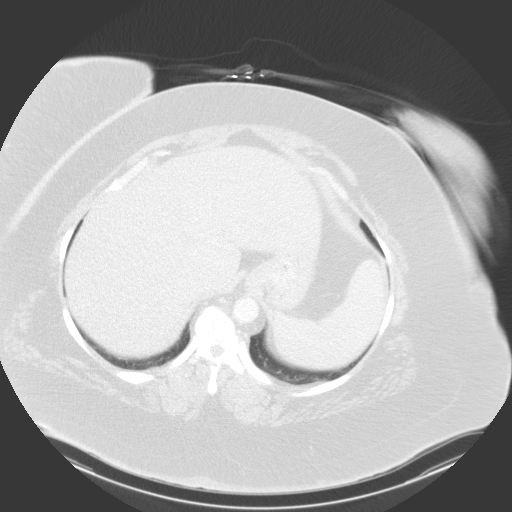
[im 88/94  soft-tissue]
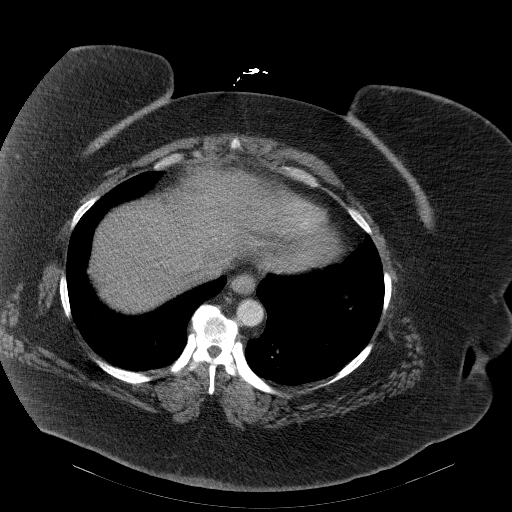
[im 88/94  lung]
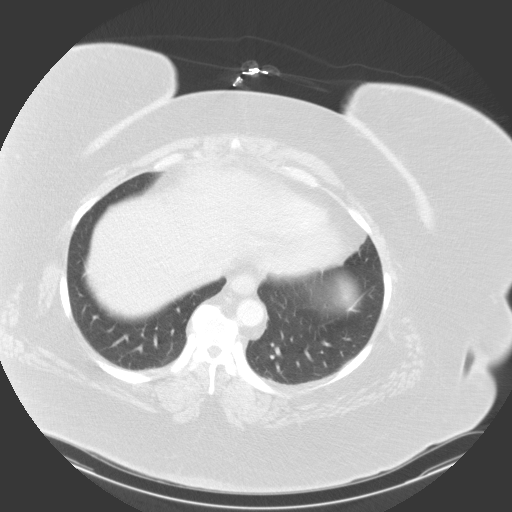

[14 of 32 positions shown; findings below may reference images not displayed]

FINDINGS: Minimal dependent changes in the lung bases.

Surgical absence of the gallbladder. The liver, spleen, pancreas,
adrenal glands, kidneys, abdominal aorta, and inferior vena cava are
unremarkable. Mild prominence of retroperitoneal lymph nodes without
pathologic enlargement, likely reactive. Stomach, small bowel, and
colon are not abnormally distended. Stool fills the colon. No free
air or free fluid in the abdomen. Abdominal wall musculature appears
intact.

Pelvis: Left adnexal process again demonstrated with soft tissue
changes and stranding in the pelvic fat. This area today measures
about 4.6 x 5.9 cm, decreasing in size since previous study.
Appearance is consistent with improving infection/tubo-ovarian
abscess. No free pelvic fluid collections. No pelvic
lymphadenopathy. Uterus is not enlarged. Appendix is surgically
absent. Degenerative changes in the spine. No destructive bone
lesions.
IMPRESSION: Improving left adnexal lesion and inflammatory stranding in the left
pelvis since prior study. No acute process otherwise demonstrated in
the abdomen or pelvis. No evidence of bowel obstruction.

## 2015-12-21 LAB — GLUCOSE, POCT (MANUAL RESULT ENTRY): POC Glucose: 96 mg/dl (ref 70–99)

## 2016-01-29 ENCOUNTER — Ambulatory Visit: Payer: Self-pay | Admitting: Dietician

## 2016-01-30 ENCOUNTER — Encounter: Payer: Medicare Other | Attending: Surgery | Admitting: Dietician

## 2016-02-11 ENCOUNTER — Encounter: Payer: Medicare Other | Attending: Surgery | Admitting: Dietician

## 2016-02-11 DIAGNOSIS — Z6838 Body mass index (BMI) 38.0-38.9, adult: Secondary | ICD-10-CM | POA: Insufficient documentation

## 2016-02-11 DIAGNOSIS — Z9884 Bariatric surgery status: Secondary | ICD-10-CM | POA: Insufficient documentation

## 2016-02-11 DIAGNOSIS — Z713 Dietary counseling and surveillance: Secondary | ICD-10-CM | POA: Insufficient documentation

## 2016-02-11 DIAGNOSIS — Z6841 Body Mass Index (BMI) 40.0 and over, adult: Secondary | ICD-10-CM

## 2016-02-11 NOTE — Patient Instructions (Addendum)
Goals:  Follow Phase 3B: High Protein + Non-Starchy Vegetables  Eat 3-6 small meals/snacks, every 3-5 hrs  Increase lean protein foods to meet 60g goal  Seafood, beans, ground chicken, [place], summer sausage, yogurt, cheese, deli meat or ham cubes, eggs, tuna salad  Add protein snacks between meals (try eating 4x per day)  Increase fluid intake to 64oz +  Avoid drinking 15 minutes before, during and 30 minutes after eating  Aim for >30 min of physical activity daily  Try mixing protein powder with yogurt and Crystal Light and soup  Work on increasing fluid intake  Surgery date: 03/13/15 Surgery type: RYGB [facility] lbs on 10/30/14 (highest weight 460 lbs) Weight today: 238.0 lbs  Weight change: 29 lbs  Total weight lost: 135 lbs  Weight loss goal: 185 lbs  TANITA  BODY COMP RESULTS  01/01/15 03/27/15 05/08/15 06/26/15 08/27/15 10/29/15 02/11/16   BMI (kg/m^2) N/A 52.1 49.3 46 43.7 42.3 38.4   Fat Mass (lbs)  183 169 153.5 138 121.8 116.6   Fat Free Mass (lbs)  139.5 136.5 131.5 133 140 121.4   Total Body Water (lbs)  102 100 96.5 98.2 102.6 88.6

## 2016-02-11 NOTE — Progress Notes (Signed)
  Follow-up visit:  11 months Post-Operative RYGB Surgery  Medical Nutrition Therapy:  Appt start time: 1200 end time: 1230  Primary concerns today: Post-operative Bariatric Surgery Nutrition Management. Returns with a 29 lb weight loss since July. Having trouble eating some meats. Does not tolerate protein shakes (too sweet).   Not meeting protein or fluid goals.   Working now 3-11. Hasn't been able to exercise as much.   Surgery date: 03/13/15 Surgery type: RYGB [facility]: 367 lbs on 10/30/14 (highest weight 460 lbs) Weight today: 238.0 lbs  Weight change: 29 lbs  Total weight lost: 135 lbs (200 lbs total!) Weight loss goal: 185 lbs  TANITA  BODY COMP RESULTS  01/01/15 03/27/15 05/08/15 06/26/15 08/27/15 10/29/15 02/11/16   BMI (kg/m^2) N/A 52.1 49.3 46 43.7 42.3 38.4   Fat Mass (lbs)  183 169 153.5 138 121.8 116.6   Fat Free Mass (lbs)  139.5 136.5 131.5 133 140 121.4   Total Body Water (lbs)  102 100 96.5 98.2 102.6 88.6   Preferred Learning Style:   No preference indicated   Learning Readiness:   Ready  24-hr recall: B (7 AM): 2-3 pieces of summer sausage with cheese and Activia Light yogurt (10g) Snk (AM):   L (4-5 PM): 1.5 oz chicken with salad or split pea, egg drop or cream of chicken soup (5-10g) Snk (PM): Malaw[place]urkey sticks (7 g) D (PM): 1.5 oz chicken with salad or split pea, egg drop or cream of chicken soup or mussels (5-10g) Snk (8-9PM): Pistachios or sugar jello or Activia Light (0-10g)   Fluid intake: at least 51 oz water with sugar free flavoring  Estimated total protein intake: 20-30g/day  Medications: see list; "no longer on diabetes medication" and taking less blood pressure medication and narcotics Supplementation: taking  CBG monitoring: not testing Average CBG per patient:  Last patient reported A1c: "5.1 or 5.2" per patient  Using straws: no Drinking while eating: no Hair loss: none, taking Biotin Carbonated beverages: no N/V/D/C: nausea  or vomiting if something doesn't agree with her, constipation, takes Miralax, Benefiber, and stool softeners Dumping syndrome: none  Recent physical activity:  Takes stairs at work, walks at work   Progress Towards [employer] Group[employer]al(s):  In progress.  Handouts given during visit include:  none   Nutritional Diagnosis:  [place]-3.3 Overweight/obesity related to past poor dietary habits and physical inactivity as evidenced by patient w/ recent RYGB surgery following dietary guidelines for continued weight loss.     Intervention:  Nutrition counseling provided. Goals:  Follow Phase 3B: High Protein + Non-Starchy Vegetables  Eat 3-6 small meals/snacks, every 3-5 hrs  Increase lean protein foods to meet 60g goal  Seafood, beans, ground chicken, Malaw[place]urkey, summer sausage, yogurt, cheese, deli meat or ham cubes, eggs, tuna salad  Add protein snacks between meals (try eating 4x per day)  Increase fluid intake to 64oz +  Avoid drinking 15 minutes before, during and 30 minutes after eating  Aim for >30 min of physical activity daily  Try mixing protein powder with yogurt and Crystal Light and soup  Work on increasing fluid intake   Teaching Method Utilized:  Scientific laboratory technicianVisual Auditory Hands on  Barriers to learning/adherence to lifestyle change: none  Demonstrated degree of understanding via:  Teach Back   Monitoring/Evaluation:  Dietary intake, exercise, and body weight. Follow up in 6 weeks for 1+ year post-op visit.

## 2016-03-25 ENCOUNTER — Ambulatory Visit: Payer: Self-pay | Admitting: Dietician

## 2016-07-04 ENCOUNTER — Other Ambulatory Visit: Payer: Self-pay | Admitting: Pain Medicine

## 2016-07-04 DIAGNOSIS — M545 Low back pain, unspecified: Secondary | ICD-10-CM

## 2016-07-12 ENCOUNTER — Inpatient Hospital Stay
Admission: RE | Admit: 2016-07-12 | Discharge: 2016-07-12 | Disposition: A | Discharge status: Home / Self Care | Payer: Self-pay | Source: Ambulatory Visit | Attending: Pain Medicine | Admitting: Pain Medicine

## 2016-08-21 ENCOUNTER — Other Ambulatory Visit: Payer: Self-pay | Admitting: Pain Medicine

## 2016-08-21 DIAGNOSIS — M25512 Pain in left shoulder: Secondary | ICD-10-CM

## 2017-01-02 ENCOUNTER — Other Ambulatory Visit: Payer: Self-pay | Admitting: Orthopedic Surgery

## 2017-01-02 DIAGNOSIS — M545 Low back pain, unspecified: Secondary | ICD-10-CM

## 2017-06-15 ENCOUNTER — Ambulatory Visit
Admission: RE | Admit: 2017-06-15 | Discharge: 2017-06-15 | Disposition: A | Discharge status: Home / Self Care | Payer: Medicare Other | Source: Ambulatory Visit | Attending: Orthopedic Surgery | Admitting: Orthopedic Surgery

## 2017-06-15 DIAGNOSIS — M545 Low back pain, unspecified: Secondary | ICD-10-CM

## 2017-07-21 ENCOUNTER — Telehealth: Payer: Self-pay | Admitting: Vascular Surgery

## 2017-07-21 NOTE — Telephone Encounter (Signed)
Sched appt 09/15/17 at 10:15. Hm# vm full, lm on cell# to inform pt of appt. Mailed pt ppw.

## 2017-07-21 NOTE — Telephone Encounter (Signed)
-----   Message from Retta Macebecca J Roberts, RN sent at 07/21/2017 10:06 AM EDT ----- Regarding: Make appointment and call patient.  Please schedule office appointment with Dr. Arbie CookeyEarly/ [facility] Dr. Marissa Nestleumonsky. Schedule appointment around 2 weeks prior to surgery date of 09/30/17.  Thank you, Kriste BasqueBecky

## 2017-08-05 ENCOUNTER — Other Ambulatory Visit: Payer: Self-pay | Admitting: *Deleted

## 2017-09-15 ENCOUNTER — Encounter: Payer: Self-pay | Admitting: Vascular Surgery

## 2017-09-17 ENCOUNTER — Other Ambulatory Visit: Payer: Self-pay | Admitting: Orthopedic Surgery

## 2017-09-22 ENCOUNTER — Telehealth: Payer: Self-pay | Admitting: *Deleted

## 2017-09-22 ENCOUNTER — Encounter: Payer: Medicare Other | Admitting: Vascular Surgery

## 2017-09-22 NOTE — Telephone Encounter (Signed)
Message left for surgery scheduler at Dumonski's office and left on patient's phone no show for appt today 09/22/17 with Dr. Arbie CookeyEarly.

## 2017-09-30 ENCOUNTER — Inpatient Hospital Stay (HOSPITAL_COMMUNITY): Admission: RE | Admit: 2017-09-30 | Payer: Medicare Other | Source: Ambulatory Visit | Admitting: Orthopedic Surgery

## 2017-09-30 ENCOUNTER — Encounter (HOSPITAL_COMMUNITY): Admission: RE | Payer: Self-pay | Source: Ambulatory Visit

## 2017-09-30 SURGERY — ANTERIOR LUMBAR FUSION 1 LEVEL
Anesthesia: General

## 2017-10-01 ENCOUNTER — Inpatient Hospital Stay (HOSPITAL_COMMUNITY): Admission: RE | Admit: 2017-10-01 | Payer: Medicare Other | Source: Ambulatory Visit | Admitting: Orthopedic Surgery

## 2017-10-01 ENCOUNTER — Encounter (HOSPITAL_COMMUNITY): Admission: RE | Payer: Self-pay | Source: Ambulatory Visit

## 2017-10-01 SURGERY — POSTERIOR LUMBAR FUSION 1 LEVEL
Anesthesia: General

## 2018-11-12 DIAGNOSIS — J45909 Unspecified asthma, uncomplicated: Secondary | ICD-10-CM | POA: Insufficient documentation

## 2018-11-12 DIAGNOSIS — Z8669 Personal history of other diseases of the nervous system and sense organs: Secondary | ICD-10-CM | POA: Insufficient documentation

## 2018-11-12 DIAGNOSIS — M797 Fibromyalgia: Secondary | ICD-10-CM | POA: Insufficient documentation

## 2018-11-12 DIAGNOSIS — E538 Deficiency of other specified B group vitamins: Secondary | ICD-10-CM | POA: Insufficient documentation

## 2018-11-16 ENCOUNTER — Other Ambulatory Visit: Payer: Self-pay | Admitting: Nurse Practitioner

## 2018-11-16 DIAGNOSIS — Z1231 Encounter for screening mammogram for malignant neoplasm of breast: Secondary | ICD-10-CM

## 2018-12-30 ENCOUNTER — Ambulatory Visit
Admission: RE | Admit: 2018-12-30 | Discharge: 2018-12-30 | Disposition: A | Discharge status: Home / Self Care | Payer: Medicare Other | Source: Ambulatory Visit | Attending: Nurse Practitioner | Admitting: Nurse Practitioner

## 2018-12-30 ENCOUNTER — Other Ambulatory Visit: Payer: Self-pay

## 2018-12-30 DIAGNOSIS — Z1231 Encounter for screening mammogram for malignant neoplasm of breast: Secondary | ICD-10-CM

## 2019-04-26 DIAGNOSIS — R1319 Other dysphagia: Secondary | ICD-10-CM | POA: Insufficient documentation

## 2019-04-26 DIAGNOSIS — Z9884 Bariatric surgery status: Secondary | ICD-10-CM | POA: Insufficient documentation

## 2019-07-06 LAB — HM COLONOSCOPY

## 2020-06-20 ENCOUNTER — Emergency Department (HOSPITAL_COMMUNITY)
Admission: EM | Admit: 2020-06-20 | Discharge: 2020-06-21 | Disposition: A | Discharge status: Home / Self Care | Payer: 59 | Attending: Emergency Medicine | Admitting: Emergency Medicine

## 2020-06-20 ENCOUNTER — Encounter (HOSPITAL_COMMUNITY): Payer: Self-pay

## 2020-06-20 ENCOUNTER — Other Ambulatory Visit: Payer: Self-pay

## 2020-06-20 DIAGNOSIS — R1010 Upper abdominal pain, unspecified: Secondary | ICD-10-CM | POA: Diagnosis present

## 2020-06-20 DIAGNOSIS — I10 Essential (primary) hypertension: Secondary | ICD-10-CM | POA: Insufficient documentation

## 2020-06-20 DIAGNOSIS — K5792 Diverticulitis of intestine, part unspecified, without perforation or abscess without bleeding: Secondary | ICD-10-CM

## 2020-06-20 DIAGNOSIS — K5732 Diverticulitis of large intestine without perforation or abscess without bleeding: Secondary | ICD-10-CM | POA: Insufficient documentation

## 2020-06-20 DIAGNOSIS — E119 Type 2 diabetes mellitus without complications: Secondary | ICD-10-CM | POA: Diagnosis not present

## 2020-06-20 DIAGNOSIS — J45909 Unspecified asthma, uncomplicated: Secondary | ICD-10-CM | POA: Diagnosis not present

## 2020-06-20 DIAGNOSIS — Z7951 Long term (current) use of inhaled steroids: Secondary | ICD-10-CM | POA: Diagnosis not present

## 2020-06-20 LAB — COMPREHENSIVE METABOLIC PANEL
ALT: 23 U/L (ref 0–44)
AST: 20 U/L (ref 15–41)
Albumin: 3.8 g/dL (ref 3.5–5.0)
Alkaline Phosphatase: 88 U/L (ref 38–126)
Anion gap: 13 (ref 5–15)
BUN: 16 mg/dL (ref 6–20)
CO2: 20 mmol/L — ABNORMAL LOW (ref 22–32)
Calcium: 9.1 mg/dL (ref 8.9–10.3)
Chloride: 106 mmol/L (ref 98–111)
Creatinine, Ser: 0.77 mg/dL (ref 0.44–1.00)
GFR, Estimated: >60 mL/min (ref >60)
Glucose, Bld: 131 mg/dL — ABNORMAL HIGH (ref 70–99)
Potassium: 3.9 mmol/L (ref 3.5–5.1)
Sodium: 139 mmol/L (ref 135–145)
Total Bilirubin: 0.7 mg/dL (ref 0.3–1.2)
Total Protein: 7.6 g/dL (ref 6.5–8.1)

## 2020-06-20 LAB — CBC
HCT: 43.4 % (ref 36.0–46.0)
Hemoglobin: 14.1 g/dL (ref 12.0–15.0)
MCH: 28.7 pg (ref 26.0–34.0)
MCHC: 32.5 g/dL (ref 30.0–36.0)
MCV: 88.2 fL (ref 80.0–100.0)
Platelets: 202 10*3/uL (ref 150–400)
RBC: 4.92 MIL/uL (ref 3.87–5.11)
RDW: 14.6 % (ref 11.5–15.5)
WBC: 6.8 10*3/uL (ref 4.0–10.5)
nRBC: 0 % (ref 0.0–0.2)

## 2020-06-20 LAB — LIPASE, BLOOD: Lipase: 36 U/L (ref 11–51)

## 2020-06-20 LAB — I-STAT BETA HCG BLOOD, ED (MC, WL, AP ONLY): I-stat hCG, quantitative: <5 m[IU]/mL (ref <5)

## 2020-06-20 MED ORDER — HYDROCORTISONE NA SUCCINATE PF 250 MG IJ SOLR
200.0000 mg | Freq: Once | INTRAMUSCULAR | Status: AC
  Administered 2020-06-21: 200 mg via INTRAVENOUS
  Filled 2020-06-20: qty 200

## 2020-06-20 MED ORDER — ONDANSETRON HCL 4 MG/2ML IJ SOLN
4.0000 mg | Freq: Once | INTRAMUSCULAR | Status: AC
  Administered 2020-06-21: 4 mg via INTRAVENOUS
  Filled 2020-06-20: qty 2

## 2020-06-20 MED ORDER — DIPHENHYDRAMINE HCL 50 MG/ML IJ SOLN
50.0000 mg | Freq: Once | INTRAMUSCULAR | Status: AC
  Filled 2020-06-20: qty 1

## 2020-06-20 MED ORDER — DIPHENHYDRAMINE HCL 25 MG PO CAPS
50.0000 mg | ORAL_CAPSULE | Freq: Once | ORAL | Status: AC
  Administered 2020-06-21: 50 mg via ORAL
  Filled 2020-06-20: qty 2

## 2020-06-20 MED ORDER — FENTANYL CITRATE (PF) 100 MCG/2ML IJ SOLN
50.0000 ug | INTRAMUSCULAR | Status: DC | PRN
  Administered 2020-06-21: 50 ug via INTRAVENOUS
  Filled 2020-06-20: qty 2

## 2020-06-20 MED ORDER — SODIUM CHLORIDE 0.9 % IV BOLUS
1000.0000 mL | Freq: Once | INTRAVENOUS | Status: AC
  Administered 2020-06-21: 1000 mL via INTRAVENOUS

## 2020-06-20 NOTE — ED Provider Notes (Signed)
[facility]-EMERGENCY DEPT Provider Note   CSN: [id number] Arrival date & time: 06/20/20  2124     History Chief Complaint  Patient presents with  . Abdominal Pain    Angelica Ortiz is a 55 y.o. female with a history of diabetes mellitus, fibromyalgia, hypertension, lupus, overactive bladder, prior gastric Roux-en-Y, salpingo-oophorectomy bilaterally, hysterectomy, appendectomy, and cholecystectomy who presents to the emergency department with complaints of abdominal pain for the past 3 days.  Patient states that the pain is primarily in the upper abdomen but does radiate into the lower abdomen bilaterally, it is a constant ache with intermittent waves of worsened pain.  It is worsened with eating/drinking attempts, no alleviating factors.  Today she developed significant nausea with one episode of emesis, she has also had a couple episodes of diarrhea associated with this per day.  She denies fever, chills, dysuria, vaginal bleeding, vaginal discharge, chest pain, or dyspnea.  HPI     Past Medical History:  Diagnosis Date  . Anemia   . Arthritis   . Asthma   . Diabetes mellitus without complication (HCC)    Type 2  . Diverticulitis   . Falls   . Fibromyalgia   . Gait instability   . GERD (gastroesophageal reflux disease)   . History of urinary tract infection   . Hypertension   . Lupus (HCC)    no meds  . Migraine   . Morbid obesity 03/30/2013  . Overactive bladder   . Overactive bladder   . Pulmonary embolism (HCC)    Resolved -At age 60 broken right leg, blood clot broke off - into lung  . Seasonal allergies   . Sleep apnea 3/15   Does not have CPAP machine  . Urinary incontinence     Patient Active Problem List   Diagnosis Date Noted  . Morbid obesity (HCC) 03/13/2015  . Pelvic pain in female 09/05/2014  . 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 Post-operative state 09/05/2014  . Tubo-ovarian abscess 06/29/2014  .  Abnormal uterine bleeding (AUB) 03/14/2014  . Pelvic abscess in female 03/30/2013  . TOA (tubo-ovarian abscess) 03/30/2013  . Morbid obesity 03/30/2013  . Diabetes mellitus without complication (HCC)   . Hypertension   . Lupus (HCC)   . Diverticulitis     Past Surgical History:  Procedure Laterality Date  . ABDOMINAL HYSTERECTOMY    . APPENDECTOMY    . CESAREAN SECTION  2001   x 1  . CHOLECYSTECTOMY    . DILATION AND CURETTAGE OF UTERUS     x 3 for AUB  . GASTRIC ROUX-EN-Y N/A 03/13/2015   Procedure: LAPAROSCOPIC ROUX-EN-Y GASTRIC BYPASS  ENTEROLYSIS OF ADHESIONS WITH UPPER ENDOSCOPY;  Surgeon: Ovidio Kin, MD;  Location: [facility];  Service: General;  Laterality: N/A;  . HYSTEROSCOPY WITH NOVASURE N/A 03/14/2014   Procedure: HYSTEROSCOPY WITH NOVASURE;  Surgeon: Willodean Rosenthal, MD;  Location: [facility];  Service: Gynecology;  Laterality: N/A;  . LAMINECTOMY     L4-L5  . LAPAROSCOPIC LYSIS OF ADHESIONS  03/13/2015   Procedure: LAPAROSCOPIC LYSIS OF ADHESIONS;  Surgeon: Ovidio Kin, MD;  Location: [facility];  Service: General;;  . LAPAROSCOPIC OOPHERECTOMY Right 09/05/2014   Procedure: abdominal OOPHERECTOMY;  Surgeon: Willodean Rosenthal, MD;  Location: [facility];  Service: Gynecology;  Laterality: Right;  . LAPAROSCOPIC OVARIAN CYSTECTOMY Right 1994  . ORIF ANKLE FRACTURE Left   . OVARIAN CYST REMOVAL Left 09/05/2014   Procedure: OVARIAN CYSTECTOMY and portion of left ovary  removed ;  Surgeon: Willodean Rosenthal, MD;  Location: [facility];  Service: Gynecology;  Laterality: Left;  . ROTATOR CUFF REPAIR     right  . SALPINGOOPHORECTOMY Bilateral 09/05/2014   Procedure: BILATERAL SALPINGECTOMY;  Surgeon: Willodean Rosenthal, MD;  Location: [facility];  Service: Gynecology;  Laterality: Bilateral;  . SUPRACERVICAL ABDOMINAL HYSTERECTOMY N/A 09/05/2014   Procedure: HYSTERECTOMY SUPRACERVICAL ABDOMINAL;  Surgeon: Willodean Rosenthal, MD;  Location: [facility];  Service: Gynecology;   Laterality: N/A;  . TONSILLECTOMY    . UPPER GI ENDOSCOPY  03/13/2015   Procedure: UPPER GI ENDOSCOPY;  Surgeon: Ovidio Kin, MD;  Location: [facility];  Service: General;;     OB History    Gravida  3   Para  1   Term  0   Preterm  1   AB  2   Living  1     SAB  2   IAB  0   Ectopic  0   Multiple  0   Live Births              Family History  Problem Relation Age of Onset  . Diabetes Mother   . Hypertension Mother   . Heart disease Mother   . Cancer Father   . Heart disease Father   . Hypertension Sister   . Cancer Sister   . Diabetes Brother   . Hypertension Brother   . Cancer Maternal Uncle   . Cancer Paternal Uncle   . Breast cancer Neg Hx     Social History   Tobacco Use  . Smoking status: Never Smoker  . Smokeless tobacco: Never Used  Substance Use Topics  . Alcohol use: No  . Drug use: No    Home Medications Prior to Admission medications   Medication Sig Start Date End Date Taking? Authorizing Provider  albuterol (PROVENTIL HFA;VENTOLIN HFA) 108 (90 BASE) MCG/ACT inhaler Inhale 2 puffs into the lungs 2 (two) times daily.   Yes [provider]  budesonide-formoterol (SYMBICORT) 160-4.5 MCG/ACT inhaler Inhale 2 puffs into the lungs 2 (two) times daily.    Yes [provider]  ELDERBERRY PO Take 1 tablet by mouth daily.   Yes [provider]  fluticasone (FLONASE) 50 MCG/ACT nasal spray Place 1 spray into both nostrils daily.   Yes [provider]  Multiple Vitamin (MULTIVITAMIN WITH MINERALS) TABS tablet Take 1 tablet by mouth 2 (two) times daily.   Yes [provider]  NURTEC 75 MG TBDP Take 1 tablet by mouth every other day. 02/01/20  Yes [provider]  oxyCODONE-acetaminophen (PERCOCET) 10-325 MG tablet Take 1 tablet by mouth 5 (five) times daily as needed for pain. 06/09/20  Yes [provider]  pantoprazole (PROTONIX) 40 MG tablet Take 40 mg by mouth daily. 03/16/20  Yes  [provider]  pregabalin (LYRICA) 75 MG capsule Take 75 mg by mouth at bedtime.   Yes [provider]  rizatriptan (MAXALT) 10 MG tablet Take 10 mg by mouth 2 (two) times daily as needed for migraine (migraine). May repeat in 2 hours if needed   Yes [provider]  vitamin B-12 (CYANOCOBALAMIN) 100 MCG tablet Take 100 mcg by mouth daily.   Yes [provider]    Allergies    Dilaudid [hydromorphone hcl], Hydromorphone hcl, Iodinated diagnostic agents, Levaquin [levofloxacin], Tangerine flavor, Ace inhibitors, and Iodine-131  Review of Systems   Review of Systems  Constitutional: Negative for chills and fever.  Respiratory: Negative for  shortness of breath.   Cardiovascular: Negative for chest pain.  Gastrointestinal: Positive for abdominal pain, diarrhea, nausea and vomiting. Negative for anal bleeding, blood in stool and constipation.  Genitourinary: Negative for dysuria, vaginal bleeding and vaginal discharge.  Neurological: Negative for syncope.  All other systems reviewed and are negative.   Physical Exam Updated Vital Signs BP (!) 150/94   Pulse 98   Temp 99.4 F (37.4 C) (Oral)   Resp 20   LMP  (LMP Unknown) Comment: bleeding  SpO2 96%   Physical Exam Vitals and nursing note reviewed.  Constitutional:      General: She is not in acute distress.    Appearance: She is well-developed. She is not toxic-appearing.  HENT:     Head: Normocephalic and atraumatic.  Eyes:     General:        Right eye: No discharge.        Left eye: No discharge.     Conjunctiva/sclera: Conjunctivae normal.  Cardiovascular:     Rate and Rhythm: Normal rate and regular rhythm.  Pulmonary:     Effort: Pulmonary effort is normal. No respiratory distress.     Breath sounds: Normal breath sounds. No wheezing, rhonchi or rales.  Abdominal:     Palpations: Abdomen is soft.     Tenderness: There is generalized abdominal tenderness (increased to upper  abdomen & LLQ).     Comments: Bowel sounds present throughout.   Musculoskeletal:     Cervical back: Neck supple.  Skin:    General: Skin is warm and dry.     Findings: No rash.  Neurological:     Mental Status: She is alert.     Comments: Clear speech.   Psychiatric:        Behavior: Behavior normal.     ED Results / Procedures / Treatments   Labs (all labs ordered are listed, but only abnormal results are displayed) Labs Reviewed  COMPREHENSIVE METABOLIC PANEL - Abnormal; Notable for the following components:      Result Value   CO2 20 (*)    Glucose, Bld 131 (*)    All other components within normal limits  LIPASE, BLOOD  CBC  URINALYSIS, ROUTINE W REFLEX MICROSCOPIC  I-STAT BETA HCG BLOOD, ED ([facility], [facility], [facility] ONLY)    EKG None  Radiology CT Abdomen Pelvis W Contrast  Result Date: 06/21/2020 CLINICAL DATA:  Nonlocalized abdominal pain. Lupus, diabetes, GERD, UTI, diverticulitis, Roux-en-Y gastric bypass, cholecystectomy, C-section, appendectomy, hysterectomy, bilateral salpingo oophorectomy EXAM: CT ABDOMEN AND PELVIS WITH CONTRAST TECHNIQUE: Multidetector CT imaging of the abdomen and pelvis was performed using the standard protocol following bolus administration of intravenous contrast. CONTRAST:  100mL OMNIPAQUE IOHEXOL 300 MG/ML  SOLN COMPARISON:  CT lumbar spine 06/15/2017, CT abdomen pelvis 07/14/2014 FINDINGS: Lower chest: No acute abnormality. Hepatobiliary: No focal liver abnormality is seen. Status post cholecystectomy. No biliary dilatation. Pancreas: No focal lesion. Normal pancreatic contour. No surrounding inflammatory changes. No main pancreatic ductal dilatation. Spleen: Normal in size without focal abnormality. Adrenals/Urinary Tract: No adrenal nodule bilaterally. Bilateral kidneys enhance symmetrically. No hydronephrosis. No hydroureter. The urinary bladder is unremarkable. On delayed imaging, there is no urothelial wall thickening and there are no filling defects  in the opacified portions of the bilateral collecting systems or ureters. Stomach/Bowel: Status post Roux-en-Y gastric bypass surgical changes. Stomach is within normal limits. No evidence of bowel wall thickening or dilatation. Few scattered colonic diverticula with mild Peri diverticular fat stranding and associated mild bowel  wall thickening of the proximal sigmoid colon. Status post appendectomy. Vascular/Lymphatic: Phleboliths are noted within the pelvis. No abdominal aorta or iliac aneurysm. No abdominal, pelvic, or inguinal lymphadenopathy. Reproductive: Status post hysterectomy. No adnexal masses. Other: No intraperitoneal free fluid. No intraperitoneal free gas. No organized fluid collection. Musculoskeletal: No abdominal wall hernia or abnormality. No suspicious lytic or blastic osseous lesions. No acute displaced fracture. Multilevel degenerative changes of the spine. IMPRESSION: Scattered colonic diverticulosis with uncomplicated mild sigmoid acute diverticulitis. Recommend colonoscopy status post treatment and status post complete resolution of inflammatory changes to exclude an underlying lesion. Electronically Signed   By: Tish Frederickson M.D.   On: 06/21/2020 04:56    Procedures Procedures   Medications Ordered in ED Medications  sodium chloride 0.9 % bolus 1,000 mL (0 mLs Intravenous Stopped 06/21/20 0455)  ondansetron (ZOFRAN) injection 4 mg (4 mg Intravenous Given 06/21/20 0011)  hydrocortisone sodium succinate (SOLU-CORTEF) injection 200 mg (200 mg Intravenous Given 06/21/20 0020)  diphenhydrAMINE (BENADRYL) capsule 50 mg (50 mg Oral Given 06/21/20 0322)    Or  diphenhydrAMINE (BENADRYL) injection 50 mg ( Intravenous See Alternative 06/21/20 0322)  iohexol (OMNIPAQUE) 300 MG/ML solution 100 mL (100 mLs Intravenous Contrast Given 06/21/20 0433)  amoxicillin-clavulanate (AUGMENTIN) 875-125 MG per tablet 1 tablet (1 tablet Oral Given 06/21/20 0516)  fentaNYL (SUBLIMAZE) injection 50 mcg (50  mcg Intravenous Given 06/21/20 0517)  famotidine (PEPCID) IVPB 20 mg premix (0 mg Intravenous Stopped 06/21/20 0559)    ED Course  I have reviewed the triage vital signs and the nursing notes.  Pertinent labs & imaging results that were available during my care of the patient were reviewed by me and considered in my medical decision making (see chart for details).    MDM Rules/Calculators/A&P                         Patient presents to the ED with complaints of abdominal pain. Patient nontoxic appearing, in no apparent distress, vitals with tachycardia which is improved as well as noted elevated BP, low suspicion for hypertensive emergency.. On exam patient tender to generalized abdomen worse in the upper quadrant as well as the left lower quadrant. Analgesics, anti-emetics, and fluids administered.   Additional history obtained:  Additional history obtained from chart review & nursing note review.   Lab Tests:  I Ordered, reviewed, and interpreted labs, which included:  CBC: Unremarkable CMP: Mild hyperglycemia mildly low bicarb however normal anion gap.  No significant electrolyte derangement.  LFTs within normal limits. Lipase: Within normal limits Preg test: Negative  Plan for CT abdomen/pelvis with contrast, patient does have a contrast dye allergy, allergy protocol initiated.  Imaging Studies ordered:  I ordered imaging studies which included CT abdomen/pelvis with contrast, I independently reviewed, formal radiology impression shows:  cute displaced fracture. Multilevel degenerative changes of the spine. IMPRESSION: Scattered colonic diverticulosis with uncomplicated mild sigmoid acute diverticulitis. Recommend colonoscopy status post treatment and status post complete resolution of inflammatory changes to exclude an underlying lesion  ED Course:   05:00: RE-EVAL: Pain mildly improved but remains uncomfortable. Patient has PRN analgesics ordered, changed to give fentanyl now,  will also give pepcid & PO challenge with augmentin.   Suspect symptomatic from mild diverticulitis, no abscess or perforation identified on CT imaging. Labs reassuring. Some symptomatic improvement. Tolerating PO. Will discharge home with antibiotics & anti-emetics. Patient is on percocet 10s QID- recommended continuing this for her abdominal pain. GI follow up. I  discussed results, treatment plan, need for follow-up, and return precautions with the patient. Provided opportunity for questions, patient confirmed understanding and is in agreement with plan.   Portions of this note were generated with Scientist, clinical (histocompatibility and immunogenetics). Dictation errors may occur despite best attempts at proofreading.   Final Clinical Impression(s) / ED Diagnoses Final diagnoses:  Diverticulitis    Rx / DC Orders ED Discharge Orders         Ordered    amoxicillin-clavulanate (AUGMENTIN) 875-125 MG tablet  Every 12 hours        06/21/20 0552    ondansetron (ZOFRAN ODT) 4 MG disintegrating tablet  Every 8 hours PRN        06/21/20 0552           Carl Bleecker, Pleas Koch, PA-C 06/21/20 [id number]    Geoffery Lyons, MD 06/22/20 [phone]

## 2020-06-20 NOTE — ED Triage Notes (Signed)
Pt reports generalized abdominal pain, nausea, vomiting, and diarrhea.

## 2020-06-21 ENCOUNTER — Encounter (HOSPITAL_COMMUNITY): Payer: Self-pay

## 2020-06-21 ENCOUNTER — Emergency Department (HOSPITAL_COMMUNITY): Payer: 59

## 2020-06-21 DIAGNOSIS — K5732 Diverticulitis of large intestine without perforation or abscess without bleeding: Secondary | ICD-10-CM | POA: Diagnosis not present

## 2020-06-21 MED ORDER — AMOXICILLIN-POT CLAVULANATE 875-125 MG PO TABS
1.0000 | ORAL_TABLET | Freq: Once | ORAL | Status: AC
  Administered 2020-06-21: 1 via ORAL
  Filled 2020-06-21: qty 1

## 2020-06-21 MED ORDER — IOHEXOL 300 MG/ML  SOLN
100.0000 mL | Freq: Once | INTRAMUSCULAR | Status: AC | PRN
  Administered 2020-06-21: 100 mL via INTRAVENOUS

## 2020-06-21 MED ORDER — ONDANSETRON 4 MG PO TBDP: 4.0000 mg | ORAL_TABLET | Freq: Three times a day (TID) | ORAL | 0 refills | Status: DC | PRN

## 2020-06-21 MED ORDER — AMOXICILLIN-POT CLAVULANATE 875-125 MG PO TABS: 1.0000 | ORAL_TABLET | Freq: Two times a day (BID) | ORAL | 0 refills | Status: DC

## 2020-06-21 MED ORDER — FENTANYL CITRATE (PF) 100 MCG/2ML IJ SOLN
50.0000 ug | Freq: Once | INTRAMUSCULAR | Status: AC
  Administered 2020-06-21: 50 ug via INTRAVENOUS
  Filled 2020-06-21: qty 2

## 2020-06-21 MED ORDER — FAMOTIDINE IN NACL 20-0.9 MG/50ML-% IV SOLN
20.0000 mg | Freq: Once | INTRAVENOUS | Status: AC
  Administered 2020-06-21: 20 mg via INTRAVENOUS
  Filled 2020-06-21: qty 50

## 2020-06-21 NOTE — Discharge Instructions (Addendum)
You were seen in the emergency department today for abdominal pain.  Your CT scan showed findings of mild diverticulitis.  We are starting on Augmentin, an antibiotic, please take this as prescribed.  We are also sending home with Zofran to take every 8 hours as needed for nausea and vomiting.  Continue to take your oxycodone as needed for pain.  We have prescribed you new medication(s) today. Discuss the medications prescribed today with your pharmacist as they can have adverse effects and interactions with your other medicines including over the counter and prescribed medications. Seek medical evaluation if you start to experience new or abnormal symptoms after taking one of these medicines, seek care immediately if you start to experience difficulty breathing, feeling of your throat closing, facial swelling, or rash as these could be indications of a more serious allergic reaction  Please follow a clear liquid diet over the next 48 hours and slowly advance it.  We would like you to follow-up with your primary care provider within 3 days for recheck of your symptoms.  We would ultimately like you to follow-up with a GI doctor as our radiologist is recommended you have a colonoscopy after improvement of your symptoms to ensure there is no underlying abnormalities throughout your colon.  Please follow-up as discussed above.  Return to the ER for new or worsening symptoms including but not limited to uncontrolled pain, new or worsening pain, fever, inability to keep fluids down, blood in your vomit or stool, or any other concerns.

## 2020-06-21 NOTE — ED Notes (Signed)
Patient provided ice water Patient instructed to notify RN with any nausea/vomiting Call bell within reach, will continue to monitor

## 2020-09-27 ENCOUNTER — Ambulatory Visit: Payer: Self-pay | Admitting: Nurse Practitioner

## 2020-10-11 ENCOUNTER — Other Ambulatory Visit: Payer: Self-pay | Admitting: Orthopedic Surgery

## 2020-10-18 ENCOUNTER — Other Ambulatory Visit: Payer: Self-pay

## 2020-10-18 ENCOUNTER — Encounter: Payer: Self-pay | Admitting: Nurse Practitioner

## 2020-10-18 ENCOUNTER — Ambulatory Visit (INDEPENDENT_AMBULATORY_CARE_PROVIDER_SITE_OTHER): Payer: 59 | Admitting: Nurse Practitioner

## 2020-10-18 VITALS — BP 128/70 | HR 68 | Temp 98.3°F | Ht 64.8 in | Wt 314.4 lb

## 2020-10-18 DIAGNOSIS — E118 Type 2 diabetes mellitus with unspecified complications: Secondary | ICD-10-CM | POA: Diagnosis not present

## 2020-10-18 DIAGNOSIS — R0609 Other forms of dyspnea: Secondary | ICD-10-CM

## 2020-10-18 DIAGNOSIS — Z6841 Body Mass Index (BMI) 40.0 and over, adult: Secondary | ICD-10-CM

## 2020-10-18 DIAGNOSIS — R0600 Dyspnea, unspecified: Secondary | ICD-10-CM

## 2020-10-18 DIAGNOSIS — Z0000 Encounter for general adult medical examination without abnormal findings: Secondary | ICD-10-CM | POA: Diagnosis not present

## 2020-10-18 DIAGNOSIS — I10 Essential (primary) hypertension: Secondary | ICD-10-CM | POA: Diagnosis not present

## 2020-10-18 DIAGNOSIS — R635 Abnormal weight gain: Secondary | ICD-10-CM

## 2020-10-18 DIAGNOSIS — R609 Edema, unspecified: Secondary | ICD-10-CM

## 2020-10-18 DIAGNOSIS — Z8616 Personal history of COVID-19: Secondary | ICD-10-CM

## 2020-10-18 MED ORDER — OZEMPIC (0.25 OR 0.5 MG/DOSE) 2 MG/1.5ML ~~LOC~~ SOPN: 0.5000 mg | PEN_INJECTOR | SUBCUTANEOUS | 1 refills | Status: DC

## 2020-10-18 NOTE — Progress Notes (Signed)
I,Tianna Badgett,acting as a Education administrator for [employer], FNP.,have documented all relevant documentation on the behalf of Minette Brine, FNP,as directed by  Minette Brine, FNP while in the presence of Minette Brine, [place].  This visit occurred during the SARS-CoV-2 public health emergency.  Safety protocols were in place, including screening questions prior to the visit, additional usage of staff PPE, and extensive cleaning of exam room while observing appropriate contact time as indicated for disinfecting solutions.  Subjective:     Patient ID: Angelica Ortiz , female    DOB: [birth year] , 55 y.o.   MRN: [id number]   Chief Complaint  Patient presents with   Establish Care         HPI  Patient is here to establish care. She was referred by her Occupational Health provider at her job at [employer] with Ronita Hipps. She was previously seen by [facility] ([facility] in [facility]. She was seeing [facility] at Dr. Lyda Kalata office until she left the practice.  She is followed by Dr Cecille Po. She is divorced. She has one biological daughter who is 30 in college and 2 step daughters the youngest is 66 y/o.   She had gastric bypass surgery in 2016 at [facility]. She had been on medications for diabetes metformin and glipizide. And was on lisinopril until she had angioedema. She was also on losartan until after her surgery. She does have a GI provider and had a colonoscopy in 2020.   She states that she has gained a lot of weight over the last few weeks. She know that she gained a lot of weight after the deaths of her 3 brothers. She now feels that she is very swollen.   She is supposed to have a right knee replacement - had injections without improvement. She sees Dr. Tamera Punt for her rotator cuff surgery on July 28th  (back), Dr. Paulo Fruit - knee provider wants her to lose 40 lbs prior to having surgery.   Dr. Theodis Sato for her back - they are wanting to do surgery to her back. She is going to pain management with Dr  Vira Blanco.   She was diagnosed with Lupus - the provider here mentioned she does not have Lupus. She had sleep apnea before she had her gastric bypass.  Wt Readings from Last 3 Encounters: 10/18/20 : (!) 314 lb 6.4 oz (142.6 kg) 02/11/16 : 238 lb (108 kg) 10/29/15 : 261 lb 12.8 oz (118.8 kg)  She was 287 lbs at the pain clinic 3 weeks. She was down to 244 lbs.  She has had 3 brothers to pass away over 3 years. She has not been to any counseling.   She had covid 3-4 weeks ago and has had shortness of breath and swelling to her lower extremities. She has taken phentermine, weight watchers  Her heaviest weight was 380's.   Metformin caused diarrhea.   She does not feel she eats a significant amount of foods    Past Medical History:  Diagnosis Date   Anemia    Arthritis    Asthma    Depression    Diabetes mellitus without complication ([place])    Type 2   Diverticulitis    Falls    Fibromyalgia    Gait instability    GERD (gastroesophageal reflux disease)    History of urinary tract infection    Hypertension    Lupus ([place])    no meds   Migraine    Morbid obesity 03/30/2013  Overactive bladder    Overactive bladder    Pulmonary embolism (HCC)    Resolved -At age 60 broken right leg, blood clot broke off - into lung   Seasonal allergies    Sleep apnea 06/05/2013   Does not have CPAP machine   Urinary incontinence      Family History  Problem Relation Age of Onset   Diabetes Mother    Hypertension Mother    Heart disease Mother    Cancer Father    Heart disease Father    Hypertension Sister    Cancer Sister    Diabetes Brother    Hypertension Brother    Cancer Maternal Uncle    Cancer Paternal Uncle    Breast cancer Neg Hx      Current Outpatient Medications:    albuterol (PROVENTIL HFA;VENTOLIN HFA) 108 (90 BASE) MCG/ACT inhaler, Inhale 2 puffs into the lungs every 6 (six) hours as needed for shortness of breath or wheezing., Disp: , Rfl:     budesonide-formoterol (SYMBICORT) 160-4.5 MCG/ACT inhaler, Inhale 2 puffs into the lungs daily., Disp: , Rfl:    DULoxetine (CYMBALTA) 30 MG capsule, Take 30 mg by mouth daily., Disp: , Rfl:    ELDERBERRY PO, Take 1 tablet by mouth daily., Disp: , Rfl:    fluticasone (FLONASE) 50 MCG/ACT nasal spray, Place 1 spray into both nostrils in the morning., Disp: , Rfl:    Multiple Vitamin (MULTIVITAMIN WITH MINERALS) TABS tablet, Take 1 tablet by mouth 2 (two) times daily., Disp: , Rfl:    NURTEC 75 MG TBDP, Take 75 mg by mouth daily as needed (migraine)., Disp: , Rfl:    ondansetron (ZOFRAN ODT) 4 MG disintegrating tablet, Take 1 tablet (4 mg total) by mouth every 8 (eight) hours as needed for nausea or vomiting., Disp: 10 tablet, Rfl: 0   pregabalin (LYRICA) 50 MG capsule, Take 50 mg by mouth at bedtime., Disp: , Rfl:    Semaglutide,0.25 or 0.5MG/DOS, (OZEMPIC, 0.25 OR 0.5 MG/DOSE,) 2 MG/1.5ML SOPN, Inject 0.5 mg into the skin once a week. (Patient taking differently: Inject 0.5 mg into the skin every Monday.), Disp: 4.5 mL, Rfl: 1   vitamin B-12 (CYANOCOBALAMIN) 100 MCG tablet, Take 100 mcg by mouth daily., Disp: , Rfl:    BIOTIN PO, Take 1 tablet by mouth daily., Disp: , Rfl:    brimonidine (ALPHAGAN) 0.2 % ophthalmic solution, Place 1 drop into both eyes in the morning and at bedtime., Disp: , Rfl:    CALCIUM PO, Take 1 tablet by mouth daily., Disp: , Rfl:    diclofenac Sodium (VOLTAREN) 1 % GEL, Apply 2 g topically daily as needed for pain., Disp: , Rfl:    MAGNESIUM PO, Take 1 tablet by mouth daily., Disp: , Rfl:    oxyCODONE-acetaminophen (PERCOCET) 5-325 MG tablet, Take 1-2 tablets by mouth every 6 (six) hours as needed for severe pain., Disp: 30 tablet, Rfl: 0   tiZANidine (ZANAFLEX) 2 MG tablet, Take 1 tablet (2 mg total) by mouth every 8 (eight) hours as needed for muscle spasms., Disp: 20 tablet, Rfl: 0   Allergies  Allergen Reactions   Dilaudid [Hydromorphone Hcl] Hives and Itching    Iodinated Diagnostic Agents Hives and Itching    Pt given 4 hour prep 06/21/20, c/o itching after injection- [facility]   Levaquin [Levofloxacin] Anaphylaxis   Tangerine Flavor Swelling    Tangerine fruit: Lip and tongue swelling with tangerines   Ace Inhibitors Swelling   Iodine-131 Itching and Rash  Review of Systems  Constitutional: Negative.   Respiratory:  Positive for shortness of breath. Negative for cough and wheezing.   Cardiovascular: Negative.  Negative for chest pain, palpitations and leg swelling.  Gastrointestinal: Negative.  Negative for constipation.  Musculoskeletal:  Positive for arthralgias (right knee pain).  Neurological: Negative.   Psychiatric/Behavioral: Negative.      Today's Vitals   10/18/20 1205  BP: 128/70  Pulse: 68  Temp: 98.3 F (36.8 C)  TempSrc: Oral  Weight: (!) 314 lb 6.4 oz (142.6 kg)  Height: 5' 4.8" (1.646 m)   Body mass index is 52.64 kg/m.   Objective:  Physical Exam Vitals reviewed.  Constitutional:      General: She is not in acute distress.    Appearance: Normal appearance. She is obese.  Cardiovascular:     Rate and Rhythm: Normal rate and regular rhythm.     Pulses: Normal pulses.     Heart sounds: Normal heart sounds. No murmur heard. Pulmonary:     Effort: Pulmonary effort is normal. No respiratory distress.     Breath sounds: Normal breath sounds. No wheezing.  Musculoskeletal:        General: Tenderness (lower extremities) present. No swelling.     Right lower leg: Edema (trace) present.     Left lower leg: Edema (trace) present.  Neurological:     General: No focal deficit present.     Mental Status: She is alert and oriented to person, place, and time.     Cranial Nerves: No cranial nerve deficit.     Motor: No weakness.  Psychiatric:        Mood and Affect: Mood normal.        Behavior: Behavior normal.        Thought Content: Thought content normal.        Judgment: Judgment normal.        Assessment And  Plan:     1. Controlled type 2 diabetes mellitus with complication, without long-term current use of insulin (HCC) - Semaglutide,0.25 or 0.5MG/DOS, (OZEMPIC, 0.25 OR 0.5 MG/DOSE,) 2 MG/1.5ML SOPN; Inject 0.5 mg into the skin once a week. (Patient taking differently: Inject 0.5 mg into the skin every Monday.)  Dispense: 4.5 mL; Refill: 1 - TSH  2. Hypertension, well controlled Good control, continue current medications - Lipid panel  3. Dyspnea on exertion Will check CXR and BNP since she has swelling to her lower extremities - Brain natriuretic peptide - DG Chest 2 View; Future  4. History of COVID-19 - DG Chest 2 View; Future  5. Abnormal weight gain Will check thyroid levels   6. Edema, unspecified type Mild edema trace to lower extremities Avoid salt and keep elevated when possible.   7. Class 3 severe obesity due to excess calories with serious comorbidity and body mass index (BMI) of 50.0 to 59.9 in adult [facility]) Chronic Discussed healthy diet and regular exercise options  Encouraged to exercise at least 150 minutes per week with 2 days of strength training Return in 2 months for weight check. - CBC - CMP14+EGFR - Hemoglobin A1c  8. Encounter for medical examination to establish care    Patient was given opportunity to ask questions. Patient verbalized understanding of the plan and was able to repeat key elements of the plan. All questions were answered to their satisfaction.  Minette Brine, FNP   I, Minette Brine, FNP, have reviewed all documentation for this visit. The documentation on 10/18/20 for the exam, diagnosis,  procedures, and orders are all accurate and complete.   IF YOU HAVE BEEN REFERRED TO A SPECIALIST, IT MAY TAKE 1-2 WEEKS TO SCHEDULE/PROCESS THE REFERRAL. IF YOU HAVE NOT HEARD FROM US/SPECIALIST IN TWO WEEKS, PLEASE GIVE [place] A CALL AT [phone].   THE PATIENT IS ENCOURAGED TO PRACTICE SOCIAL DISTANCING DUE TO THE COVID-19 PANDEMIC.

## 2020-10-19 LAB — CMP14+EGFR
ALT: 27 IU/L (ref 0–32)
AST: 21 IU/L (ref 0–40)
Albumin/Globulin Ratio: 1.4 (ref 1.2–2.2)
Albumin: 4 g/dL (ref 3.8–4.9)
Alkaline Phosphatase: 110 IU/L (ref 44–121)
BUN/Creatinine Ratio: 19 (ref 9–23)
BUN: 16 mg/dL (ref 6–24)
Bilirubin Total: 0.4 mg/dL (ref 0.0–1.2)
CO2: 25 mmol/L (ref 20–29)
Calcium: 9.3 mg/dL (ref 8.7–10.2)
Chloride: 101 mmol/L (ref 96–106)
Creatinine, Ser: 0.83 mg/dL (ref 0.57–1.00)
Globulin, Total: 2.9 g/dL (ref 1.5–4.5)
Glucose: 100 mg/dL — ABNORMAL HIGH (ref 65–99)
Potassium: 4.7 mmol/L (ref 3.5–5.2)
Sodium: 141 mmol/L (ref 134–144)
Total Protein: 6.9 g/dL (ref 6.0–8.5)
eGFR: 84 mL/min/{1.73_m2} (ref >59)

## 2020-10-19 LAB — CBC
Hematocrit: 40.8 % (ref 34.0–46.6)
Hemoglobin: 13.6 g/dL (ref 11.1–15.9)
MCH: 28.9 pg (ref 26.6–33.0)
MCHC: 33.3 g/dL (ref 31.5–35.7)
MCV: 87 fL (ref 79–97)
Platelets: 234 10*3/uL (ref 150–450)
RBC: 4.71 x10E6/uL (ref 3.77–5.28)
RDW: 14.1 % (ref 11.7–15.4)
WBC: 5.5 10*3/uL (ref 3.4–10.8)

## 2020-10-19 LAB — LIPID PANEL
Chol/HDL Ratio: 2.2 ratio (ref 0.0–4.4)
Cholesterol, Total: 160 mg/dL (ref 100–199)
HDL: 72 mg/dL (ref >39)
LDL Chol Calc (NIH): 73 mg/dL (ref 0–99)
Triglycerides: 82 mg/dL (ref 0–149)
VLDL Cholesterol Cal: 15 mg/dL (ref 5–40)

## 2020-10-19 LAB — TSH: TSH: 1.15 u[IU]/mL (ref 0.450–4.500)

## 2020-10-19 LAB — HEMOGLOBIN A1C
Est. average glucose Bld gHb Est-mCnc: 120 mg/dL
Hgb A1c MFr Bld: 5.8 % — ABNORMAL HIGH (ref 4.8–5.6)

## 2020-10-19 LAB — BRAIN NATRIURETIC PEPTIDE: BNP: 27.8 pg/mL (ref 0.0–100.0)

## 2020-10-25 NOTE — Patient Instructions (Signed)
DUE TO COVID-19 ONLY ONE VISITOR IS ALLOWED TO COME WITH YOU AND STAY IN THE WAITING ROOM ONLY DURING PRE OP AND PROCEDURE.   **NO VISITORS ARE ALLOWED IN THE SHORT STAY AREA OR RECOVERY ROOM!!**   Your procedure is scheduled on: 11/01/20   Report to [facility]  Entrance    Report to admitting at 12:20 PM   Call this number if you have problems the morning of surgery [phone]   Do not eat food :After Midnight.   May have liquids until 11:20 AM day of surgery  CLEAR LIQUID DIET  Foods Allowed                                                                     Foods Excluded  Water, Black Coffee and tea, regular and decaf                liquids that you cannot  Plain Jell-O in any flavor  (No red)                                     see through such as: Fruit ices (not with fruit pulp)                                             milk, soups, orange juice              Iced Popsicles (No red)                                                All solid food                                   Apple juices Sports drinks like Gatorade (No red) Lightly seasoned clear broth or consume(fat free) Sugar, honey syrup      The day of surgery:  Drink ONE (1) Pre-Surgery G2 by 11:20 am the morning of surgery. Drink in one sitting. Do not sip.  This drink was given to you during your hospital  pre-op appointment visit. Nothing else to drink after completing the  Pre-Surgery G2.          If you have questions, please contact your surgeon's office.     Oral Hygiene is also important to reduce your risk of infection.                                    Remember - BRUSH YOUR TEETH THE MORNING OF SURGERY WITH YOUR REGULAR TOOTHPASTE   Do NOT smoke after Midnight   Take these medicines the morning of surgery with A SIP OF WATER: Duloxetine, Percocet, Inhalers.   DO NOT TAKE ANY ORAL DIABETIC MEDICATIONS DAY OF YOUR SURGERY  How to Manage  Your Diabetes Before and After  Surgery  Why is it important to control my blood sugar before and after surgery? Improving blood sugar levels before and after surgery helps healing and can limit problems. A way of improving blood sugar control is eating a healthy diet by:  Eating less sugar and carbohydrates  Increasing activity/exercise  Talking with your doctor about reaching your blood sugar goals High blood sugars (greater than 180 mg/dL) can raise your risk of infections and slow your recovery, so you will need to focus on controlling your diabetes during the weeks before surgery. Make sure that the doctor who takes care of your diabetes knows about your planned surgery including the date and location.  How do I manage my blood sugar before surgery? Check your blood sugar at least 4 times a day, starting 2 days before surgery, to make sure that the level is not too high or low. Check your blood sugar the morning of your surgery when you wake up and every 2 hours until you get to the Short Stay unit. If your blood sugar is less than 70 mg/dL, you will need to treat for low blood sugar: Do not take insulin. Treat a low blood sugar (less than 70 mg/dL) with  cup of clear juice (cranberry or apple), 4 glucose tablets, OR glucose gel. Recheck blood sugar in 15 minutes after treatment (to make sure it is greater than 70 mg/dL). If your blood sugar is not greater than 70 mg/dL on recheck, call [phone] for further instructions. Report your blood sugar to the short stay nurse when you get to Short Stay.  If you are admitted to the hospital after surgery: Your blood sugar will be checked by the staff and you will probably be given insulin after surgery (instead of oral diabetes medicines) to make sure you have good blood sugar levels. The goal for blood sugar control after surgery is 80-180 mg/dL.   WHAT DO I DO ABOUT MY DIABETES MEDICATION?  Do not take oral diabetes medicines (pills) the morning of surgery.  THE  NIGHT BEFORE SURGERY, take     units of       insulin.       THE MORNING OF SURGERY, take   units of         insulin.  The day of surgery, do not take other diabetes injectables, including Byetta (exenatide), Bydureon (exenatide ER), Victoza (liraglutide), or Trulicity (dulaglutide).   Reviewed and Endorsed by [facility] Patient Education Committee, August 2015                               You may not have any metal on your body including hair pins, jewelry, and body piercing             Do not wear make-up, lotions, powders, perfumes, or deodorant  Do not wear nail polish including gel and S&S, artificial/acrylic nails, or any other type of covering on natural nails including finger and toenails. If you have artificial nails, gel coating, etc. that needs to be removed by a nail salon please have this removed prior to surgery or surgery may need to be canceled/ delayed if the surgeon/ anesthesia feels like they are unable to be safely monitored.   Do not shave  48 hours prior to surgery.    Do not bring valuables to the hospital. [place] IS NOT  RESPONSIBLE   FOR VALUABLES.   Contacts, dentures or bridgework may not be worn into surgery.    Patients discharged the day of surgery will not be allowed to drive home.  Special Instructions: Bring a copy of your healthcare power of attorney and living will documents         the day of surgery if you haven't scanned them in before.   Please read over the following fact sheets you were given: IF YOU HAVE QUESTIONS ABOUT YOUR PRE OP INSTRUCTIONS PLEASE CALL [phone]   [place] - Preparing for Surgery Before surgery, you can play an important role.  Because skin is not sterile, your skin needs to be as free of germs as possible.  You can reduce the number of germs on your skin by washing with CHG (chlorahexidine gluconate) soap before surgery.  CHG is an antiseptic cleaner which kills germs and bonds with the skin to  continue killing germs even after washing. Please DO NOT use if you have an allergy to CHG or antibacterial soaps.  If your skin becomes reddened/irritated stop using the CHG and inform your nurse when you arrive at Short Stay. Do not shave (including legs and underarms) for at least 48 hours prior to the first CHG shower.  You may shave your face/neck.  Please follow these instructions carefully:  1.  Shower with CHG Soap the night before surgery and the  morning of surgery.  2.  If you choose to wash your hair, wash your hair first as usual with your normal  shampoo.  3.  After you shampoo, rinse your hair and body thoroughly to remove the shampoo.                             4.  Use CHG as you would any other liquid soap.  You can apply chg directly to the skin and wash.  Gently with a scrungie or clean washcloth.  5.  Apply the CHG Soap to your body ONLY FROM THE NECK DOWN.   Do   not use on face/ open                           Wound or open sores. Avoid contact with eyes, ears mouth and   genitals (private parts).                       Wash face,  Genitals (private parts) with your normal soap.             6.  Wash thoroughly, paying special attention to the area where your    surgery  will be performed.  7.  Thoroughly rinse your body with warm water from the neck down.  8.  DO NOT shower/wash with your normal soap after using and rinsing off the CHG Soap.                9.  Pat yourself dry with a clean towel.            10.  Wear clean pajamas.            11.  Place clean sheets on your bed the night of your first shower and do not  sleep with pets. Day of Surgery : Do not apply any lotions/deodorants the morning of surgery.  Please wear clean clothes to the hospital/surgery center.  FAILURE TO FOLLOW THESE INSTRUCTIONS MAY RESULT IN THE CANCELLATION OF YOUR SURGERY  PATIENT SIGNATURE_________________________________  NURSE  SIGNATURE__________________________________  ________________________________________________________________________   Rogelia Mire  An incentive spirometer is a tool that can help keep your lungs clear and active. This tool measures how well you are filling your lungs with each breath. Taking long deep breaths may help reverse or decrease the chance of developing breathing (pulmonary) problems (especially infection) following: A long period of time when you are unable to move or be active. BEFORE THE PROCEDURE  If the spirometer includes an indicator to show your best effort, your nurse or respiratory therapist will set it to a desired goal. If possible, sit up straight or lean slightly forward. Try not to slouch. Hold the incentive spirometer in an upright position. INSTRUCTIONS FOR USE  Sit on the edge of your bed if possible, or sit up as far as you can in bed or on a chair. Hold the incentive spirometer in an upright position. Breathe out normally. Place the mouthpiece in your mouth and seal your lips tightly around it. Breathe in slowly and as deeply as possible, raising the piston or the ball toward the top of the column. Hold your breath for 3-5 seconds or for as long as possible. Allow the piston or ball to fall to the bottom of the column. Remove the mouthpiece from your mouth and breathe out normally. Rest for a few seconds and repeat Steps 1 through 7 at least 10 times every 1-2 hours when you are awake. Take your time and take a few normal breaths between deep breaths. The spirometer may include an indicator to show your best effort. Use the indicator as a goal to work toward during each repetition. After each set of 10 deep breaths, practice coughing to be sure your lungs are clear. If you have an incision (the cut made at the time of surgery), support your incision when coughing by placing a pillow or rolled up towels firmly against it. Once you are able to get out of  bed, walk around indoors and cough well. You may stop using the incentive spirometer when instructed by your caregiver.  RISKS AND COMPLICATIONS Take your time so you do not get dizzy or light-headed. If you are in pain, you may need to take or ask for pain medication before doing incentive spirometry. It is harder to take a deep breath if you are having pain. AFTER USE Rest and breathe slowly and easily. It can be helpful to keep track of a log of your progress. Your caregiver can provide you with a simple table to help with this. If you are using the spirometer at home, follow these instructions: SEEK MEDICAL CARE IF:  You are having difficultly using the spirometer. You have trouble using the spirometer as often as instructed. Your pain medication is not giving enough relief while using the spirometer. You develop fever of 100.5 F (38.1 C) or higher. SEEK IMMEDIATE MEDICAL CARE IF:  You cough up bloody sputum that had not been present before. You develop fever of 102 F (38.9 C) or greater. You develop worsening pain at or near the incision site. MAKE SURE YOU:  Understand these instructions. Will watch your condition. Will get help right away if you are not doing well or get worse. Document Released: 08/04/2006 Document Revised: 06/16/2011 Document Reviewed: 10/05/2006 [facility] Patient Information 2014 [place], [place].   ________________________________________________________________________

## 2020-10-25 NOTE — Progress Notes (Signed)
COVID Vaccine Completed: Yes x4 Date COVID Vaccine completed: 06/17/19, 07/08/19 Has received booster: 01/24/20, 07/28/20 COVID vaccine manufacturer: Pfizer      Date of COVID positive in last 90 days:  PCP - Arnette Felts, FNP Cardiologist -   Chest x-ray -  EKG -  Stress Test -  ECHO -  Cardiac Cath -  Pacemaker/ICD device last checked: Spinal Cord Stimulator:  Sleep Study -  CPAP -   Fasting Blood Sugar -  Checks Blood Sugar _____ times a day  Blood Thinner Instructions: Aspirin Instructions: Last Dose:  Activity level:  Can go up a flight of stairs and perform activities of daily living without stopping and without symptoms of chest pain or shortness of breath.   Able to exercise without symptoms  Unable to go up a flight of stairs without symptoms of      Anesthesia review: HTN, DM  Patient denies shortness of breath, fever, cough and chest pain at PAT appointment   Patient verbalized understanding of instructions that were given to them at the PAT appointment. Patient was also instructed that they will need to review over the PAT instructions again at home before surgery.

## 2020-10-26 ENCOUNTER — Encounter (HOSPITAL_COMMUNITY)
Admission: RE | Admit: 2020-10-26 | Discharge: 2020-10-26 | Disposition: A | Discharge status: Home / Self Care | Payer: Medicare Other | Source: Ambulatory Visit | Attending: Orthopedic Surgery | Admitting: Orthopedic Surgery

## 2020-10-29 ENCOUNTER — Ambulatory Visit (INDEPENDENT_AMBULATORY_CARE_PROVIDER_SITE_OTHER): Payer: Medicare Other | Admitting: Nurse Practitioner

## 2020-10-29 ENCOUNTER — Other Ambulatory Visit: Payer: Self-pay

## 2020-10-29 ENCOUNTER — Other Ambulatory Visit: Payer: Self-pay | Admitting: Orthopedic Surgery

## 2020-10-29 ENCOUNTER — Encounter: Payer: Self-pay | Admitting: Nurse Practitioner

## 2020-10-29 VITALS — BP 124/76 | HR 77 | Temp 98.0°F | Ht 64.8 in | Wt 308.2 lb

## 2020-10-29 DIAGNOSIS — I10 Essential (primary) hypertension: Secondary | ICD-10-CM | POA: Diagnosis not present

## 2020-10-29 DIAGNOSIS — G8929 Other chronic pain: Secondary | ICD-10-CM | POA: Diagnosis not present

## 2020-10-29 DIAGNOSIS — Z01818 Encounter for other preprocedural examination: Secondary | ICD-10-CM

## 2020-10-29 DIAGNOSIS — M25512 Pain in left shoulder: Secondary | ICD-10-CM | POA: Diagnosis not present

## 2020-10-29 NOTE — Progress Notes (Signed)
I,Katawbba Wiggins,acting as a Neurosurgeon for [employer], FNP.,have documented all relevant documentation on the behalf of Arnette Felts, FNP,as directed by  Arnette Felts, FNP while in the presence of Arnette Felts, FNP.  This visit occurred during the SARS-CoV-2 public health emergency.  Safety protocols were in place, including screening questions prior to the visit, additional usage of staff PPE, and extensive cleaning of exam room while observing appropriate contact time as indicated for disinfecting solutions.  Subjective:     Patient ID: Angelica Ortiz , female    DOB: 01-02-1966 , 55 y.o.   MRN: [id number]   Chief Complaint  Patient presents with   Follow-up    HPI  The patient is here today for an EKG that is needed before surgery.  She is to have left shoulder rotator cuff repair with [facility]. She has a history of Asthma and does not use a CPAP.  She has stopped all of her vitamins. Her surgery is tomorrow at 12 noon.   Wt Readings from Last 3 Encounters: 10/29/20 : (!) 308 lb 3.2 oz (139.8 kg) 10/18/20 : (!) 314 lb 6.4 oz (142.6 kg) 02/11/16 : 238 lb (108 kg)  She has also noticed her appetite has decreased since starting the Ozempic.     Other This is a chronic problem.    Past Medical History:  Diagnosis Date   Anemia    Arthritis    Asthma    Depression    Diabetes mellitus without complication (HCC)    Type 2   Diverticulitis    Falls    Fibromyalgia    Gait instability    GERD (gastroesophageal reflux disease)    History of urinary tract infection    Hypertension    Lupus (HCC)    no meds   Migraine    Morbid obesity 03/30/2013   Overactive bladder    Overactive bladder    Pulmonary embolism (HCC)    Resolved -At age 38 broken right leg, blood clot broke off - into lung   Seasonal allergies    Sleep apnea 06/05/2013   Does not have CPAP machine   Urinary incontinence      Family History  Problem Relation Age of Onset   Diabetes Mother     Hypertension Mother    Heart disease Mother    Cancer Father    Heart disease Father    Hypertension Sister    Cancer Sister    Diabetes Brother    Hypertension Brother    Cancer Maternal Uncle    Cancer Paternal Uncle    Breast cancer Neg Hx      Current Outpatient Medications:    albuterol (PROVENTIL HFA;VENTOLIN HFA) 108 (90 BASE) MCG/ACT inhaler, Inhale 2 puffs into the lungs every 6 (six) hours as needed for shortness of breath or wheezing., Disp: , Rfl:    BIOTIN PO, Take 1 tablet by mouth daily., Disp: , Rfl:    brimonidine (ALPHAGAN) 0.2 % ophthalmic solution, Place 1 drop into both eyes in the morning and at bedtime., Disp: , Rfl:    budesonide-formoterol (SYMBICORT) 160-4.5 MCG/ACT inhaler, Inhale 2 puffs into the lungs daily., Disp: , Rfl:    CALCIUM PO, Take 1 tablet by mouth daily., Disp: , Rfl:    diclofenac Sodium (VOLTAREN) 1 % GEL, Apply 2 g topically daily as needed for pain., Disp: , Rfl:    DULoxetine (CYMBALTA) 30 MG capsule, Take 30 mg by mouth daily., Disp: , Rfl:  ELDERBERRY PO, Take 1 tablet by mouth daily., Disp: , Rfl:    fluticasone (FLONASE) 50 MCG/ACT nasal spray, Place 1 spray into both nostrils in the morning., Disp: , Rfl:    MAGNESIUM PO, Take 1 tablet by mouth daily., Disp: , Rfl:    Multiple Vitamin (MULTIVITAMIN WITH MINERALS) TABS tablet, Take 1 tablet by mouth 2 (two) times daily., Disp: , Rfl:    NURTEC 75 MG TBDP, Take 75 mg by mouth daily as needed (migraine)., Disp: , Rfl:    ondansetron (ZOFRAN ODT) 4 MG disintegrating tablet, Take 1 tablet (4 mg total) by mouth every 8 (eight) hours as needed for nausea or vomiting., Disp: 10 tablet, Rfl: 0   pregabalin (LYRICA) 50 MG capsule, Take 50 mg by mouth at bedtime., Disp: , Rfl:    Semaglutide,0.25 or 0.5MG /DOS, (OZEMPIC, 0.25 OR 0.5 MG/DOSE,) 2 MG/1.5ML SOPN, Inject 0.5 mg into the skin once a week. (Patient taking differently: Inject 0.5 mg into the skin every Monday.), Disp: 4.5 mL, Rfl: 1    vitamin B-12 (CYANOCOBALAMIN) 100 MCG tablet, Take 100 mcg by mouth daily., Disp: , Rfl:    oxyCODONE-acetaminophen (PERCOCET) 5-325 MG tablet, Take 1-2 tablets by mouth every 6 (six) hours as needed for severe pain., Disp: 30 tablet, Rfl: 0   tiZANidine (ZANAFLEX) 2 MG tablet, Take 1 tablet (2 mg total) by mouth every 8 (eight) hours as needed for muscle spasms., Disp: 20 tablet, Rfl: 0   Allergies  Allergen Reactions   Dilaudid [Hydromorphone Hcl] Hives and Itching   Iodinated Diagnostic Agents Hives and Itching    Pt given 4 hour prep 06/21/20, c/o itching after injection- [facility]   Levaquin [Levofloxacin] Anaphylaxis   Tangerine Flavor Swelling    Tangerine fruit: Lip and tongue swelling with tangerines   Ace Inhibitors Swelling   Iodine-131 Itching and Rash     Review of Systems  Constitutional: Negative.   Respiratory: Negative.    Cardiovascular: Negative.   Gastrointestinal: Negative.   Psychiatric/Behavioral: Negative.    All other systems reviewed and are negative.   Today's Vitals   10/29/20 1222  BP: 124/76  Pulse: 77  Temp: 98 F (36.7 C)  TempSrc: Oral  Weight: (!) 308 lb 3.2 oz (139.8 kg)  Height: 5' 4.8" (1.646 m)   Body mass index is 51.6 kg/m.   Objective:  Physical Exam      Assessment And Plan:     1. Hypertension, well controlled Comments: Blood pressure is well controlled - EKG 12-Lead  2. Chronic left shoulder pain Comments: She is planning to have shoulder surgery once she is cleared  3. Pre-operative clearance Comments: EKG done with NSR and HR 79 She is cleared for surgery with a moderate risk     Patient was given opportunity to ask questions. Patient verbalized understanding of the plan and was able to repeat key elements of the plan. All questions were answered to their satisfaction.  Arnette Felts, FNP   I, Arnette Felts, FNP, have reviewed all documentation for this visit. The documentation on 12/03/20 for the exam, diagnosis,  procedures, and orders are all accurate and complete.   IF YOU HAVE BEEN REFERRED TO A SPECIALIST, IT MAY TAKE 1-2 WEEKS TO SCHEDULE/PROCESS THE REFERRAL. IF YOU HAVE NOT HEARD FROM US/SPECIALIST IN TWO WEEKS, PLEASE GIVE [place] A CALL AT [phone].   THE PATIENT IS ENCOURAGED TO PRACTICE SOCIAL DISTANCING DUE TO THE COVID-19 PANDEMIC.

## 2020-10-30 ENCOUNTER — Other Ambulatory Visit: Payer: Self-pay

## 2020-10-30 ENCOUNTER — Encounter (HOSPITAL_COMMUNITY)
Admission: RE | Admit: 2020-10-30 | Discharge: 2020-10-30 | Disposition: A | Discharge status: Home / Self Care | Payer: Medicare Other | Source: Ambulatory Visit | Attending: Orthopedic Surgery | Admitting: Orthopedic Surgery

## 2020-10-30 ENCOUNTER — Encounter (HOSPITAL_COMMUNITY): Payer: Self-pay

## 2020-10-30 DIAGNOSIS — Z01818 Encounter for other preprocedural examination: Secondary | ICD-10-CM | POA: Diagnosis present

## 2020-10-30 HISTORY — DX: Depression, unspecified: F32.A

## 2020-10-30 NOTE — Patient Instructions (Signed)
DUE TO COVID-19 ONLY ONE VISITOR IS ALLOWED TO COME WITH YOU AND STAY IN THE WAITING ROOM ONLY DURING PRE OP AND PROCEDURE DAY OF SURGERY. THE 1 VISITOR  MAY VISIT WITH YOU AFTER SURGERY IN YOUR PRIVATE ROOM DURING VISITING HOURS ONLY!               Angelica Ortiz   Your procedure is scheduled on: 11/01/20   Report to [facility] Main  Entrance   Report to admitting at: 12:20 PM.     Call this number if you have problems the morning of surgery [phone]    Remember: NO SOLID FOOD AFTER MIDNIGHT THE NIGHT PRIOR TO SURGERY. NOTHING BY MOUTH EXCEPT CLEAR LIQUIDS UNTIL : 11:20 AM. PLEASE FINISH GATORADE DRINK PER SURGEON ORDER  WHICH NEEDS TO BE COMPLETED AT : 11:20 AM.   CLEAR LIQUID DIET  Foods Allowed                                                                     Foods Excluded  Coffee and tea, regular and decaf                             liquids that you cannot  Plain Jell-O any favor except red or purple                                           see through such as: Fruit ices (not with fruit pulp)                                     milk, soups, orange juice  Iced Popsicles                                    All solid food Carbonated beverages, regular and diet                                    Cranberry, grape and apple juices Sports drinks like Gatorade Lightly seasoned clear broth or consume(fat free) Sugar, honey syrup  Sample Menu Breakfast                                Lunch                                     Supper Cranberry juice                    Beef broth                            Chicken broth Jell-O  Grape juice                           Apple juice Coffee or tea                        Jell-O                                      Popsicle                                                Coffee or tea                        Coffee or tea  _____________________________________________________________________   BRUSH  YOUR TEETH MORNING OF SURGERY AND RINSE YOUR MOUTH OUT, NO CHEWING GUM CANDY OR MINTS.    Take these medicines the morning of surgery with A SIP OF WATER:duloxetine.Percocet as needed.Use inhalers as usual.  How to Manage Your Diabetes Before and After Surgery  Why is it important to control my blood sugar before and after surgery? Improving blood sugar levels before and after surgery helps healing and can limit problems. A way of improving blood sugar control is eating a healthy diet by:  Eating less sugar and carbohydrates  Increasing activity/exercise  Talking with your doctor about reaching your blood sugar goals High blood sugars (greater than 180 mg/dL) can raise your risk of infections and slow your recovery, so you will need to focus on controlling your diabetes during the weeks before surgery. Make sure that the doctor who takes care of your diabetes knows about your planned surgery including the date and location.  How do I manage my blood sugar before surgery? Check your blood sugar at least 4 times a day, starting 2 days before surgery, to make sure that the level is not too high or low. Check your blood sugar the morning of your surgery when you wake up and every 2 hours until you get to the Short Stay unit. If your blood sugar is less than 70 mg/dL, you will need to treat for low blood sugar: Do not take insulin. Treat a low blood sugar (less than 70 mg/dL) with  cup of clear juice (cranberry or apple), 4 glucose tablets, OR glucose gel. Recheck blood sugar in 15 minutes after treatment (to make sure it is greater than 70 mg/dL). If your blood sugar is not greater than 70 mg/dL on recheck, call [phone] for further instructions. Report your blood sugar to the short stay nurse when you get to Short Stay.  If you are admitted to the hospital after surgery: Your blood sugar will be checked by the staff and you will probably be given insulin after surgery (instead of oral  diabetes medicines) to make sure you have good blood sugar levels. The goal for blood sugar control after surgery is 80-180 mg/dL.   WHAT DO I DO ABOUT MY DIABETES MEDICATION?  Do not take oral diabetes medicines (pills) the morning of surgery.  THE DAY BEFORE SURGERY, take samaglutide as usual      THE MORNING OF SURGERY,DO NOT TAKE ANY DIABETIC MEDICATIONS DAY OF YOUR SURGERY  You may not have any metal on your body including hair pins and              piercings  Do not wear jewelry, make-up, lotions, powders or perfumes, deodorant             Do not wear nail polish on your fingernails.  Do not shave  48 hours prior to surgery.    Do not bring valuables to the hospital. [facility] IS NOT             RESPONSIBLE   FOR VALUABLES.  Contacts, dentures or bridgework may not be worn into surgery.  Leave suitcase in the car. After surgery it may be brought to your room.     Patients discharged the day of surgery will not be allowed to drive home. IF YOU ARE HAVING SURGERY AND GOING HOME THE SAME DAY, YOU MUST HAVE AN ADULT TO DRIVE YOU HOME AND BE WITH YOU FOR 24 HOURS. YOU MAY GO HOME BY TAXI OR UBER OR ORTHERWISE, BUT AN ADULT MUST ACCOMPANY YOU HOME AND STAY WITH YOU FOR 24 HOURS.  Name and phone number of your driver:  Special Instructions: N/A              Please read over the following fact sheets you were given: ____________________________________________________________________           [facility] - Preparing for Surgery Before surgery, you can play an important role.  Because skin is not sterile, your skin needs to be as free of germs as possible.  You can reduce the number of germs on your skin by washing with CHG (chlorahexidine gluconate) soap before surgery.  CHG is an antiseptic cleaner which kills germs and bonds with the skin to continue killing germs even after washing. Please DO NOT use if you have an allergy to CHG or antibacterial  soaps.  If your skin becomes reddened/irritated stop using the CHG and inform your nurse when you arrive at Short Stay. Do not shave (including legs and underarms) for at least 48 hours prior to the first CHG shower.  You may shave your face/neck. Please follow these instructions carefully:  1.  Shower with CHG Soap the night before surgery and the  morning of Surgery.  2.  If you choose to wash your hair, wash your hair first as usual with your  normal  shampoo.  3.  After you shampoo, rinse your hair and body thoroughly to remove the  shampoo.                           4.  Use CHG as you would any other liquid soap.  You can apply chg directly  to the skin and wash                       Gently with a scrungie or clean washcloth.  5.  Apply the CHG Soap to your body ONLY FROM THE NECK DOWN.   Do not use on face/ open                           Wound or open sores. Avoid contact with eyes, ears mouth and genitals (private parts).                       Wash face,  Medical illustrator (private parts)  with your normal soap.             6.  Wash thoroughly, paying special attention to the area where your surgery  will be performed.  7.  Thoroughly rinse your body with warm water from the neck down.  8.  DO NOT shower/wash with your normal soap after using and rinsing off  the CHG Soap.                9.  Pat yourself dry with a clean towel.            10.  Wear clean pajamas.            11.  Place clean sheets on your bed the night of your first shower and do not  sleep with pets. Day of Surgery : Do not apply any lotions/deodorants the morning of surgery.  Please wear clean clothes to the hospital/surgery center.  FAILURE TO FOLLOW THESE INSTRUCTIONS MAY RESULT IN THE CANCELLATION OF YOUR SURGERY PATIENT SIGNATURE_________________________________  NURSE SIGNATURE__________________________________  ________________________________________________________________________   Angelica Ortiz  An  incentive spirometer is a tool that can help keep your lungs clear and active. This tool measures how well you are filling your lungs with each breath. Taking long deep breaths may help reverse or decrease the chance of developing breathing (pulmonary) problems (especially infection) following: A long period of time when you are unable to move or be active. BEFORE THE PROCEDURE  If the spirometer includes an indicator to show your best effort, your nurse or respiratory therapist will set it to a desired goal. If possible, sit up straight or lean slightly forward. Try not to slouch. Hold the incentive spirometer in an upright position. INSTRUCTIONS FOR USE  Sit on the edge of your bed if possible, or sit up as far as you can in bed or on a chair. Hold the incentive spirometer in an upright position. Breathe out normally. Place the mouthpiece in your mouth and seal your lips tightly around it. Breathe in slowly and as deeply as possible, raising the piston or the ball toward the top of the column. Hold your breath for 3-5 seconds or for as long as possible. Allow the piston or ball to fall to the bottom of the column. Remove the mouthpiece from your mouth and breathe out normally. Rest for a few seconds and repeat Steps 1 through 7 at least 10 times every 1-2 hours when you are awake. Take your time and take a few normal breaths between deep breaths. The spirometer may include an indicator to show your best effort. Use the indicator as a goal to work toward during each repetition. After each set of 10 deep breaths, practice coughing to be sure your lungs are clear. If you have an incision (the cut made at the time of surgery), support your incision when coughing by placing a pillow or rolled up towels firmly against it. Once you are able to get out of bed, walk around indoors and cough well. You may stop using the incentive spirometer when instructed by your caregiver.  RISKS AND COMPLICATIONS Take  your time so you do not get dizzy or light-headed. If you are in pain, you may need to take or ask for pain medication before doing incentive spirometry. It is harder to take a deep breath if you are having pain. AFTER USE Rest and breathe slowly and easily. It can be helpful to keep track of a log of your progress.  Your caregiver can provide you with a simple table to help with this. If you are using the spirometer at home, follow these instructions: [place] IF:  You are having difficultly using the spirometer. You have trouble using the spirometer as often as instructed. Your pain medication is not giving enough relief while using the spirometer. You develop fever of 100.5 F (38.1 C) or higher. SEEK IMMEDIATE MEDICAL CARE IF:  You cough up bloody sputum that had not been present before. You develop fever of 102 F (38.9 C) or greater. You develop worsening pain at or near the incision site. MAKE SURE YOU:  Understand these instructions. Will watch your condition. Will get help right away if you are not doing well or get worse. Document Released: 08/04/2006 Document Revised: 06/16/2011 Document Reviewed: 10/05/2006 [facility] Patient Information 2014 [place], [place].   ________________________________________________________________________

## 2020-11-01 ENCOUNTER — Encounter (HOSPITAL_COMMUNITY): Payer: Self-pay | Admitting: Orthopedic Surgery

## 2020-11-01 ENCOUNTER — Encounter (HOSPITAL_COMMUNITY): Admission: RE | Payer: Self-pay | Source: Home / Self Care

## 2020-11-01 ENCOUNTER — Ambulatory Visit (HOSPITAL_COMMUNITY): Admission: RE | Admit: 2020-11-01 | Payer: 59 | Source: Home / Self Care | Admitting: Orthopedic Surgery

## 2020-11-01 ENCOUNTER — Ambulatory Visit (HOSPITAL_COMMUNITY): Payer: 59 | Admitting: Certified Registered Nurse Anesthetist

## 2020-11-01 ENCOUNTER — Other Ambulatory Visit: Payer: Self-pay

## 2020-11-01 ENCOUNTER — Encounter: Payer: Self-pay | Admitting: Nurse Practitioner

## 2020-11-01 ENCOUNTER — Encounter (HOSPITAL_COMMUNITY)
Admission: RE | Disposition: A | Discharge status: Home / Self Care | Payer: Self-pay | Source: Ambulatory Visit | Attending: Orthopedic Surgery

## 2020-11-01 ENCOUNTER — Ambulatory Visit (HOSPITAL_COMMUNITY)
Admission: RE | Admit: 2020-11-01 | Discharge: 2020-11-01 | Disposition: A | Discharge status: Home / Self Care | Payer: 59 | Source: Ambulatory Visit | Attending: Orthopedic Surgery | Admitting: Orthopedic Surgery

## 2020-11-01 DIAGNOSIS — Z791 Long term (current) use of non-steroidal anti-inflammatories (NSAID): Secondary | ICD-10-CM | POA: Diagnosis not present

## 2020-11-01 DIAGNOSIS — Z91018 Allergy to other foods: Secondary | ICD-10-CM | POA: Insufficient documentation

## 2020-11-01 DIAGNOSIS — Z79899 Other long term (current) drug therapy: Secondary | ICD-10-CM | POA: Insufficient documentation

## 2020-11-01 DIAGNOSIS — M19012 Primary osteoarthritis, left shoulder: Secondary | ICD-10-CM | POA: Diagnosis not present

## 2020-11-01 DIAGNOSIS — Z833 Family history of diabetes mellitus: Secondary | ICD-10-CM | POA: Insufficient documentation

## 2020-11-01 DIAGNOSIS — Z7951 Long term (current) use of inhaled steroids: Secondary | ICD-10-CM | POA: Insufficient documentation

## 2020-11-01 DIAGNOSIS — Z6841 Body Mass Index (BMI) 40.0 and over, adult: Secondary | ICD-10-CM | POA: Diagnosis not present

## 2020-11-01 DIAGNOSIS — Z809 Family history of malignant neoplasm, unspecified: Secondary | ICD-10-CM | POA: Insufficient documentation

## 2020-11-01 DIAGNOSIS — M7542 Impingement syndrome of left shoulder: Secondary | ICD-10-CM | POA: Diagnosis not present

## 2020-11-01 DIAGNOSIS — Z86711 Personal history of pulmonary embolism: Secondary | ICD-10-CM | POA: Insufficient documentation

## 2020-11-01 DIAGNOSIS — Z885 Allergy status to narcotic agent status: Secondary | ICD-10-CM | POA: Diagnosis not present

## 2020-11-01 DIAGNOSIS — Z888 Allergy status to other drugs, medicaments and biological substances status: Secondary | ICD-10-CM | POA: Diagnosis not present

## 2020-11-01 DIAGNOSIS — E119 Type 2 diabetes mellitus without complications: Secondary | ICD-10-CM | POA: Insufficient documentation

## 2020-11-01 DIAGNOSIS — M75122 Complete rotator cuff tear or rupture of left shoulder, not specified as traumatic: Secondary | ICD-10-CM | POA: Insufficient documentation

## 2020-11-01 DIAGNOSIS — Z91041 Radiographic dye allergy status: Secondary | ICD-10-CM | POA: Diagnosis not present

## 2020-11-01 DIAGNOSIS — Z8249 Family history of ischemic heart disease and other diseases of the circulatory system: Secondary | ICD-10-CM | POA: Insufficient documentation

## 2020-11-01 DIAGNOSIS — Z881 Allergy status to other antibiotic agents status: Secondary | ICD-10-CM | POA: Diagnosis not present

## 2020-11-01 HISTORY — PX: SHOULDER ARTHROSCOPY WITH ROTATOR CUFF REPAIR AND SUBACROMIAL DECOMPRESSION: SHX5686

## 2020-11-01 LAB — GLUCOSE, CAPILLARY
Glucose-Capillary: 99 mg/dL (ref 70–99)
Glucose-Capillary: 106 mg/dL — ABNORMAL HIGH (ref 70–99)

## 2020-11-01 SURGERY — SHOULDER ARTHROSCOPY WITH ROTATOR CUFF REPAIR AND SUBACROMIAL DECOMPRESSION
Anesthesia: General | Laterality: Left

## 2020-11-01 SURGERY — SHOULDER ARTHROSCOPY WITH ROTATOR CUFF REPAIR AND SUBACROMIAL DECOMPRESSION
Anesthesia: Choice | Laterality: Left

## 2020-11-01 MED ORDER — FENTANYL CITRATE (PF) 100 MCG/2ML IJ SOLN
50.0000 ug | INTRAMUSCULAR | Status: DC
  Administered 2020-11-01: 50 ug via INTRAVENOUS
  Filled 2020-11-01: qty 2

## 2020-11-01 MED ORDER — SODIUM CHLORIDE 0.9 % IR SOLN
Status: DC | PRN
  Administered 2020-11-01: 6000 mL

## 2020-11-01 MED ORDER — BUPIVACAINE-EPINEPHRINE (PF) 0.5% -1:200000 IJ SOLN
INTRAMUSCULAR | Status: DC | PRN
  Administered 2020-11-01: 20 mL via PERINEURAL

## 2020-11-01 MED ORDER — OXYCODONE HCL 5 MG PO TABS
10.0000 mg | ORAL_TABLET | ORAL | Status: DC | PRN
  Administered 2020-11-01: 10 mg via ORAL

## 2020-11-01 MED ORDER — PHENYLEPHRINE 40 MCG/ML (10ML) SYRINGE FOR IV PUSH (FOR BLOOD PRESSURE SUPPORT)
PREFILLED_SYRINGE | INTRAVENOUS | Status: DC | PRN
  Administered 2020-11-01 (×3): 80 ug via INTRAVENOUS

## 2020-11-01 MED ORDER — ONDANSETRON HCL 4 MG/2ML IJ SOLN
INTRAMUSCULAR | Status: DC | PRN
  Administered 2020-11-01: 4 mg via INTRAVENOUS

## 2020-11-01 MED ORDER — METHOCARBAMOL 500 MG PO TABS
500.0000 mg | ORAL_TABLET | Freq: Four times a day (QID) | ORAL | Status: DC | PRN
  Administered 2020-11-01: 500 mg via ORAL

## 2020-11-01 MED ORDER — LIDOCAINE 2% (20 MG/ML) 5 ML SYRINGE
INTRAMUSCULAR | Status: DC | PRN
  Administered 2020-11-01: 5 mg via INTRAVENOUS

## 2020-11-01 MED ORDER — FENTANYL CITRATE (PF) 100 MCG/2ML IJ SOLN
25.0000 ug | INTRAMUSCULAR | Status: DC | PRN
  Administered 2020-11-01 (×3): 50 ug via INTRAVENOUS

## 2020-11-01 MED ORDER — FENTANYL CITRATE (PF) 100 MCG/2ML IJ SOLN
INTRAMUSCULAR | Status: DC | PRN
  Administered 2020-11-01: 100 ug via INTRAVENOUS

## 2020-11-01 MED ORDER — CHLORHEXIDINE GLUCONATE 0.12 % MT SOLN
15.0000 mL | Freq: Once | OROMUCOSAL | Status: AC
  Administered 2020-11-01: 15 mL via OROMUCOSAL

## 2020-11-01 MED ORDER — OXYCODONE HCL 5 MG PO TABS: 5.0000 mg | ORAL_TABLET | ORAL | Status: DC | PRN

## 2020-11-01 MED ORDER — OXYCODONE-ACETAMINOPHEN 5-325 MG PO TABS: 1.0000 | ORAL_TABLET | Freq: Four times a day (QID) | ORAL | 0 refills | Status: DC | PRN

## 2020-11-01 MED ORDER — DEXAMETHASONE SODIUM PHOSPHATE 4 MG/ML IJ SOLN
INTRAMUSCULAR | Status: DC | PRN
  Administered 2020-11-01: 10 mg via INTRAVENOUS

## 2020-11-01 MED ORDER — ROCURONIUM BROMIDE 10 MG/ML (PF) SYRINGE
PREFILLED_SYRINGE | INTRAVENOUS | Status: DC | PRN
  Administered 2020-11-01: 60 mg via INTRAVENOUS

## 2020-11-01 MED ORDER — SUGAMMADEX SODIUM 200 MG/2ML IV SOLN
INTRAVENOUS | Status: DC | PRN
  Administered 2020-11-01: 300 mg via INTRAVENOUS

## 2020-11-01 MED ORDER — ONDANSETRON HCL 4 MG/2ML IJ SOLN
4.0000 mg | Freq: Once | INTRAMUSCULAR | Status: AC | PRN
  Administered 2020-11-01: 4 mg via INTRAVENOUS

## 2020-11-01 MED ORDER — TIZANIDINE HCL 2 MG PO TABS: 2.0000 mg | ORAL_TABLET | Freq: Three times a day (TID) | ORAL | 0 refills | Status: DC | PRN

## 2020-11-01 MED ORDER — ORAL CARE MOUTH RINSE: 15.0000 mL | Freq: Once | OROMUCOSAL | Status: AC

## 2020-11-01 MED ORDER — LACTATED RINGERS IV SOLN: INTRAVENOUS | Status: DC

## 2020-11-01 MED ORDER — CEFAZOLIN IN SODIUM CHLORIDE 3-0.9 GM/100ML-% IV SOLN
3.0000 g | INTRAVENOUS | Status: AC
  Administered 2020-11-01: 3 g via INTRAVENOUS
  Filled 2020-11-01: qty 100

## 2020-11-01 MED ORDER — BUPIVACAINE LIPOSOME 1.3 % IJ SUSP
INTRAMUSCULAR | Status: DC | PRN
  Administered 2020-11-01: 10 mL via PERINEURAL

## 2020-11-01 MED ORDER — METHOCARBAMOL 500 MG IVPB - SIMPLE MED: 500.0000 mg | Freq: Four times a day (QID) | INTRAVENOUS | Status: DC | PRN

## 2020-11-01 MED ORDER — MIDAZOLAM HCL 2 MG/2ML IJ SOLN
1.0000 mg | INTRAMUSCULAR | Status: DC
  Administered 2020-11-01: 1 mg via INTRAVENOUS
  Filled 2020-11-01: qty 2

## 2020-11-01 MED ORDER — PROPOFOL 10 MG/ML IV BOLUS
INTRAVENOUS | Status: DC | PRN
  Administered 2020-11-01: 200 mg via INTRAVENOUS

## 2020-11-01 SURGICAL SUPPLY — 59 items
ANCHOR PEEK 4.75X19.1 SWLK C (Anchor) ×4 IMPLANT
BAG COUNTER SPONGE SURGICOUNT (BAG) IMPLANT
BOOTIES KNEE HIGH SLOAN (MISCELLANEOUS) ×4 IMPLANT
BURR OVAL 8 FLU 4.0X13 (MISCELLANEOUS) ×2 IMPLANT
CANNULA 5.75X7 CRYSTAL CLEAR (CANNULA) ×2 IMPLANT
CANNULA TWIST IN 8.25X7CM (CANNULA) ×2 IMPLANT
COOLER ICEMAN CLASSIC (MISCELLANEOUS) IMPLANT
COVER SURGICAL LIGHT HANDLE (MISCELLANEOUS) ×2 IMPLANT
CUTTER BONE 4.0MM X 13CM (MISCELLANEOUS) ×2 IMPLANT
DISSECTOR  3.8MM X 13CM (MISCELLANEOUS)
DISSECTOR 3.8MM X 13CM (MISCELLANEOUS) IMPLANT
DRAPE IMP U-DRAPE 54X76 (DRAPES) ×2 IMPLANT
DRAPE INCISE IOBAN 66X45 STRL (DRAPES) IMPLANT
DRAPE ORTHO SPLIT 77X108 STRL (DRAPES) ×2
DRAPE STERI 35X30 U-POUCH (DRAPES) ×2 IMPLANT
DRAPE SURG ORHT 6 SPLT 77X108 (DRAPES) ×2 IMPLANT
DRAPE U-SHAPE 47X51 STRL (DRAPES) ×4 IMPLANT
DRSG PAD ABDOMINAL 8X10 ST (GAUZE/BANDAGES/DRESSINGS) ×2 IMPLANT
DURAPREP 26ML APPLICATOR (WOUND CARE) ×2 IMPLANT
ELECT REM PT RETURN 15FT ADLT (MISCELLANEOUS) ×2 IMPLANT
GAUZE SPONGE 4X4 12PLY STRL (GAUZE/BANDAGES/DRESSINGS) ×2 IMPLANT
GAUZE XEROFORM 1X8 LF (GAUZE/BANDAGES/DRESSINGS) ×2 IMPLANT
GLOVE SRG 8 PF TXTR STRL LF DI (GLOVE) ×1 IMPLANT
GLOVE SURG ENC MOIS LTX SZ6.5 (GLOVE) ×2 IMPLANT
GLOVE SURG ENC MOIS LTX SZ7.5 (GLOVE) ×2 IMPLANT
GLOVE SURG UNDER POLY LF SZ6.5 (GLOVE) ×2 IMPLANT
GLOVE SURG UNDER POLY LF SZ8 (GLOVE) ×1
GOWN STRL REUS W/TWL LRG LVL3 (GOWN DISPOSABLE) ×2 IMPLANT
GOWN STRL REUS W/TWL XL LVL3 (GOWN DISPOSABLE) ×2 IMPLANT
KIT BASIN OR (CUSTOM PROCEDURE TRAY) ×2 IMPLANT
KIT PUSHLOCK 2.9 HIP (KITS) IMPLANT
KIT TURNOVER KIT A (KITS) ×2 IMPLANT
LASSO 90 CVE QUICKPAS (DISPOSABLE) IMPLANT
LASSO CRESCENT QUICKPASS (SUTURE) IMPLANT
MANIFOLD NEPTUNE II (INSTRUMENTS) ×2 IMPLANT
NEEDLE SCORPION MULTI FIRE (NEEDLE) ×2 IMPLANT
PACK ARTHROSCOPY WL (CUSTOM PROCEDURE TRAY) ×2 IMPLANT
PAD COLD SHLDR WRAP-ON (PAD) IMPLANT
PROBE BIPOLAR ATHRO 135MM 90D (MISCELLANEOUS) ×2 IMPLANT
PROTECTOR NERVE ULNAR (MISCELLANEOUS) ×2 IMPLANT
RESTRAINT HEAD UNIVERSAL NS (MISCELLANEOUS) IMPLANT
SLING ARM FOAM STRAP LRG (SOFTGOODS) IMPLANT
SLING ARM FOAM STRAP MED (SOFTGOODS) IMPLANT
SLING ARM IMMOBILIZER LRG (SOFTGOODS) IMPLANT
SLING ARM IMMOBILIZER MED (SOFTGOODS) IMPLANT
SUPPORT WRAP ARM LG (MISCELLANEOUS) ×2 IMPLANT
SUT ETHILON 3 0 PS 1 (SUTURE) ×4 IMPLANT
SUT PDS AB 1 CT1 27 (SUTURE) IMPLANT
SUT TIGER TAPE 7 IN WHITE (SUTURE) IMPLANT
SUTURE TAPE 1.3 40 TPR END (SUTURE) IMPLANT
SUTURETAPE 1.3 40 TPR END (SUTURE)
TAPE CLOTH SURG 4X10 WHT LF (GAUZE/BANDAGES/DRESSINGS) ×2 IMPLANT
TAPE FIBER 2MM 7IN #2 BLUE (SUTURE) IMPLANT
TAPE LABRALWHITE 1.5X36 (TAPE) IMPLANT
TAPE SUT LABRALTAP WHT/BLK (SUTURE) IMPLANT
TOWEL OR 17X26 10 PK STRL BLUE (TOWEL DISPOSABLE) ×2 IMPLANT
TOWEL OR NON WOVEN STRL DISP B (DISPOSABLE) ×2 IMPLANT
TUBING ARTHROSCOPY IRRIG 16FT (MISCELLANEOUS) ×2 IMPLANT
TUBING CONNECTING 10 (TUBING) ×4 IMPLANT

## 2020-11-01 NOTE — Anesthesia Procedure Notes (Signed)
Procedure Name: Intubation Date/Time: 11/01/2020 2:34 PM Performed by: Vanessa Kirby, CRNA Pre-anesthesia Checklist: Emergency Drugs available, Suction available, Patient identified and Patient being monitored Patient Re-evaluated:Patient Re-evaluated prior to induction Oxygen Delivery Method: Circle system utilized Preoxygenation: Pre-oxygenation with 100% oxygen Induction Type: IV induction Ventilation: Mask ventilation without difficulty Laryngoscope Size: Glidescope and 4 Grade View: Grade I Tube type: Oral Number of attempts: 1 Airway Equipment and Method: Video-laryngoscopy Placement Confirmation: ETT inserted through vocal cords under direct vision, positive ETCO2 and breath sounds checked- equal and bilateral Secured at: 21 cm Tube secured with: Tape Dental Injury: Teeth and Oropharynx as per pre-operative assessment

## 2020-11-01 NOTE — Anesthesia Procedure Notes (Signed)
Anesthesia Regional Block: Interscalene brachial plexus block   Pre-Anesthetic Checklist: , timeout performed,  Correct Patient, Correct Site, Correct Laterality,  Correct Procedure, Correct Position, site marked,  Risks and benefits discussed,  Surgical consent,  Pre-op evaluation,  At surgeon's request and post-op pain management  Laterality: Left  Prep: chloraprep       Needles:  Injection technique: Single-shot  Needle Type: Echogenic Stimulator Needle     Needle Length: 10cm  Needle Gauge: 21   Needle insertion depth: 7 cm   Additional Needles:   Procedures:,,,, ultrasound used (permanent image in chart),,   Motor weakness within 6 minutes.  Narrative:  Start time: 11/01/2020 1:22 PM End time: 11/01/2020 1:27 PM Injection made incrementally with aspirations every 5 mL.  Performed by: Personally  Anesthesiologist: Mal Amabile, MD  Additional Notes: Timeout performed. Patient sedated. Relevant anatomy ID'd using [place]. Incremental 2-60ml injection of LA with frequent aspiration. Patient tolerated procedure well.    Left Interscalene Block

## 2020-11-01 NOTE — Anesthesia Preprocedure Evaluation (Addendum)
Anesthesia Evaluation  Patient identified by MRN, date of birth, ID band Patient awake    Reviewed: Allergy & Precautions, NPO status , Patient's Chart, lab work & pertinent test results, reviewed documented beta blocker date and time   Airway Mallampati: III  TM Distance: >3 FB Neck ROM: Full    Dental no notable dental hx. (+) Teeth Intact, Dental Advisory Given   Pulmonary asthma , sleep apnea , PE Hx/o PTE age 55 Hx/o OSA- not currently using CPAP since weight loss Covid 09/2020 fully recovered   Pulmonary exam normal breath sounds clear to auscultation       Cardiovascular hypertension, Pt. on medications Normal cardiovascular exam Rhythm:Regular Rate:Normal     Neuro/Psych  Headaches, PSYCHIATRIC DISORDERS Depression Glaucoma  Neuromuscular disease    GI/Hepatic Neg liver ROS, GERD  Medicated and Controlled,S/P gastric bypass   Endo/Other  diabetes, Well Controlled, Type 2, Oral Hypoglycemic AgentsMorbid obesity  Renal/GU negative Renal ROS  negative genitourinary   Musculoskeletal  (+) Arthritis , Osteoarthritis,  Fibromyalgia -Impingement syndrome left shoulder   Abdominal (+) + obese,   Peds  Hematology  (+) anemia ,   Anesthesia Other Findings   Reproductive/Obstetrics                            Anesthesia Physical Anesthesia Plan  ASA: 3  Anesthesia Plan: General   Post-op Pain Management:    Induction: Intravenous  PONV Risk Score and Plan: Treatment may vary due to age or medical condition, Ondansetron and Dexamethasone  Airway Management Planned: Oral ETT  Additional Equipment:   Intra-op Plan:   Post-operative Plan: Extubation in OR  Informed Consent: I have reviewed the patients History and Physical, chart, labs and discussed the procedure including the risks, benefits and alternatives for the proposed anesthesia with the patient or authorized representative  who has indicated his/her understanding and acceptance.     Dental advisory given  Plan Discussed with: CRNA and Anesthesiologist  Anesthesia Plan Comments:         Anesthesia Quick Evaluation

## 2020-11-01 NOTE — Op Note (Signed)
Procedure(s): SHOULDER ARTHROSCOPY WITH ROTATOR CUFF REPAIR AND SUBACROMIAL DECOMPRESSION, DISTAL CLAVICLE EXCISION Procedure Note  Angelica Ortiz female 55 y.o. 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  Postoperative diagnosis: Same  Procedure(s) and Anesthesia Type:    * SHOULDER ARTHROSCOPY WITH ROTATOR CUFF REPAIR AND SUBACROMIAL DECOMPRESSION, DISTAL CLAVICLE EXCISION - Choice  Surgeon(s) and Role:    Jones Broom, MD - Primary     Surgeon: Berline Lopes   Assistants: Fredia Sorrow PA-C [name] was present and scrubbed throughout the procedure and was essential in positioning, assisting with the camera and instrumentation,, and closure)  Anesthesia: General endotracheal anesthesia with preoperative interscalene block given by the attending anesthesiologist    Procedure Detail  SHOULDER ARTHROSCOPY WITH ROTATOR CUFF REPAIR AND SUBACROMIAL DECOMPRESSION, DISTAL CLAVICLE EXCISION  Estimated Blood Loss: Min         Drains: none  Blood Given: none         Specimens: none        Complications:  * No complications entered in OR log *         Disposition: PACU - hemodynamically stable.         Condition: stable    Procedure:   INDICATIONS FOR SURGERY: The patient is 55 y.o. female who has a history of left shoulder pain which is failed conservative management.  She was found on MRI to have full-thickness rotator cuff tear and symptomatic AC joint DJD.  OPERATIVE FINDINGS: Examination under anesthesia: No stiffness or instability   DESCRIPTION OF PROCEDURE: The patient was identified in preoperative  holding area where I personally marked the operative site after  verifying site, side, and procedure with the patient. An interscalene block was given by the attending anesthesiologist the holding area.  The patient was  taken back to the operating room where general anesthesia was induced without complication and was placed in the beach-chair position with the back  elevated about 60 degrees and all extremities and head and neck carefully padded and  positioned.   The left upper extremity was then prepped and  draped in a standard sterile fashion. The appropriate time-out  procedure was carried out. The patient did receive IV antibiotics  within 30 minutes of incision.   A small posterior portal incision was made and the arthroscope was introduced into the joint. An anterior portal was then established above the subscapularis using needle localization. Small cannula was placed anteriorly. Diagnostic arthroscopy was then carried out.  The subscapularis was frayed but not significantly torn.  This was lightly debrided.  The labrum and biceps were intact.  Glenohumeral joint surfaces were intact without significant chondromalacia.  She was noted to have a small full-thickness tear of the anterior supraspinatus with minimal retraction.  This was lightly debrided from the undersurface.    The arthroscope was then introduced into the subacromial space a standard lateral portal was established with needle localization. The shaver was used through the lateral portal to perform extensive bursectomy. Coracoacromial ligament was examined and found to be frayed indicating chronic impingement.  The bursal side of the rotator cuff tear was identified.  There was full-thickness penetration.  This is only about 1.5 cm anterior to posterior and minimally retracted.  The tear edge was debrided and the tuberosity was debrided down to a bleeding healing surface.  The repair was then carried out by placing a  4.75 peek swivel lock anchor preloaded with 2 suture tapes just off the articular margin.  These were passed evenly throughout the tear and brought over to 1 additional 4.75 swivel lock bringing the tendon nicely down over the  prepared tuberosity with no undue tension.  The coracoacromial ligament was taken down off the anterior acromion with the ArthroCare exposing a moderate anterior acromial spur. A high-speed bur was then used through the lateral portal to take down the anterior acromial spur from lateral to medial in a standard acromioplasty.  The acromioplasty was also viewed from the lateral portal and the bur was used as necessary to ensure that the acromion was completely flat from posterior to anterior.  The distal clavicle was exposed arthroscopically and the bur was used to resect 8 to 10 mm in a smooth even fashion through the anterior portal while viewing from the lateral portal.  This was viewed from anterior and lateral portals and felt to be complete.  The arthroscopic equipment was removed from the joint and the portals were closed with 3-0 nylon in an interrupted fashion. Sterile dressings were then applied including Xeroform 4 x 4's ABDs and tape. The patient was then allowed to awaken from general anesthesia, placed in a sling, transferred to the stretcher and taken to the recovery room in stable condition.   POSTOPERATIVE PLAN: The patient will be discharged home today and will followup in one week for suture removal and wound check.

## 2020-11-01 NOTE — Anesthesia Postprocedure Evaluation (Signed)
Anesthesia Post Note  Patient: Angelica Ortiz  Procedure(s) Performed: SHOULDER ARTHROSCOPY WITH ROTATOR CUFF REPAIR AND SUBACROMIAL DECOMPRESSION, DISTAL CLAVICLE EXCISION (Left)     Patient location during evaluation: PACU Anesthesia Type: General Level of consciousness: awake and alert and oriented Pain management: pain level controlled Vital Signs Assessment: post-procedure vital signs reviewed and stable Respiratory status: spontaneous breathing, nonlabored ventilation and respiratory function stable Cardiovascular status: blood pressure returned to baseline and stable Postop Assessment: no apparent nausea or vomiting Anesthetic complications: no   No notable events documented.  Last Vitals:  Vitals:   11/01/20 1630 11/01/20 1645  BP: 133/70 126/79  Pulse: 71 72  Resp: 13 13  Temp:    SpO2: 96% 90%    Last Pain:  Vitals:   11/01/20 1645  TempSrc:   PainSc: 7                  [name]

## 2020-11-01 NOTE — H&P (Signed)
Angelica Ortiz is an 55 y.o. female.   Chief Complaint: L shoulder pain HPI: Left shoulder symptomatic full-thickness rotator cuff tear and AC joint arthropathy which has failed conservative management.  Past Medical History:  Diagnosis Date   Anemia    Arthritis    Asthma    Depression    Diabetes mellitus without complication (HCC)    Type 2   Diverticulitis    Falls    Fibromyalgia    Gait instability    GERD (gastroesophageal reflux disease)    History of urinary tract infection    Hypertension    Lupus (HCC)    no meds   Migraine    Morbid obesity 03/30/2013   Overactive bladder    Overactive bladder    Pulmonary embolism (HCC)    Resolved -At age 63 broken right leg, blood clot broke off - into lung   Seasonal allergies    Sleep apnea 06/05/2013   Does not have CPAP machine   Urinary incontinence     Past Surgical History:  Procedure Laterality Date   ABDOMINAL HYSTERECTOMY     APPENDECTOMY     CESAREAN SECTION  04/08/1999   x 1   CHOLECYSTECTOMY     DILATION AND CURETTAGE OF UTERUS     x 3 for AUB   GASTRIC ROUX-EN-Y N/A 03/13/2015   Procedure: LAPAROSCOPIC ROUX-EN-Y GASTRIC BYPASS  ENTEROLYSIS OF ADHESIONS WITH UPPER ENDOSCOPY;  Surgeon: Ovidio Kin, MD;  Location: [facility];  Service: General;  Laterality: N/A;   HYSTEROSCOPY WITH NOVASURE N/A 03/14/2014   Procedure: HYSTEROSCOPY WITH NOVASURE;  Surgeon: Willodean Rosenthal, MD;  Location: [facility];  Service: Gynecology;  Laterality: N/A;   LAMINECTOMY     L4-L5   LAPAROSCOPIC LYSIS OF ADHESIONS  03/13/2015   Procedure: LAPAROSCOPIC LYSIS OF ADHESIONS;  Surgeon: Ovidio Kin, MD;  Location: [facility];  Service: General;;   LAPAROSCOPIC OOPHERECTOMY Right 09/05/2014   Procedure: abdominal OOPHERECTOMY;  Surgeon: Willodean Rosenthal, MD;  Location: [facility];  Service: Gynecology;  Laterality: Right;   LAPAROSCOPIC OVARIAN CYSTECTOMY Right 04/07/1992   ORIF ANKLE FRACTURE Left    OVARIAN CYST REMOVAL Left  09/05/2014   Procedure: OVARIAN CYSTECTOMY and portion of left ovary removed ;  Surgeon: Willodean Rosenthal, MD;  Location: [facility];  Service: Gynecology;  Laterality: Left;   ROTATOR CUFF REPAIR     right   ROTATOR CUFF REPAIR Right    SALPINGOOPHORECTOMY Bilateral 09/05/2014   Procedure: BILATERAL SALPINGECTOMY;  Surgeon: Willodean Rosenthal, MD;  Location: [facility];  Service: Gynecology;  Laterality: Bilateral;   SUPRACERVICAL ABDOMINAL HYSTERECTOMY N/A 09/05/2014   Procedure: HYSTERECTOMY SUPRACERVICAL ABDOMINAL;  Surgeon: Willodean Rosenthal, MD;  Location: [facility];  Service: Gynecology;  Laterality: N/A;   TONSILLECTOMY     UPPER GI ENDOSCOPY  03/13/2015   Procedure: UPPER GI ENDOSCOPY;  Surgeon: Ovidio Kin, MD;  Location: [facility];  Service: General;;    Family History  Problem Relation Age of Onset   Diabetes Mother    Hypertension Mother    Heart disease Mother    Cancer Father    Heart disease Father    Hypertension Sister    Cancer Sister    Diabetes Brother    Hypertension Brother    Cancer Maternal Uncle    Cancer Paternal Uncle    Breast cancer Neg Hx    Social History:  reports that she has never smoked. She has never used smokeless tobacco. She reports that she does not drink  alcohol and does not use drugs.  Allergies:  Allergies  Allergen Reactions   Dilaudid [Hydromorphone Hcl] Hives and Itching   Iodinated Diagnostic Agents Hives and Itching    Pt given 4 hour prep 06/21/20, c/o itching after injection- [facility]   Levaquin [Levofloxacin] Anaphylaxis   Tangerine Flavor Swelling    Tangerine fruit: Lip and tongue swelling with tangerines   Ace Inhibitors Swelling   Iodine-131 Itching and Rash    Medications Prior to Admission  Medication Sig Dispense Refill   albuterol (PROVENTIL HFA;VENTOLIN HFA) 108 (90 BASE) MCG/ACT inhaler Inhale 2 puffs into the lungs every 6 (six) hours as needed for shortness of breath or wheezing.     BIOTIN PO Take 1 tablet by  mouth daily.     brimonidine (ALPHAGAN) 0.2 % ophthalmic solution Place 1 drop into both eyes in the morning and at bedtime.     budesonide-formoterol (SYMBICORT) 160-4.5 MCG/ACT inhaler Inhale 2 puffs into the lungs daily.     CALCIUM PO Take 1 tablet by mouth daily.     diclofenac Sodium (VOLTAREN) 1 % GEL Apply 2 g topically daily as needed for pain.     DULoxetine (CYMBALTA) 30 MG capsule Take 30 mg by mouth daily.     ELDERBERRY PO Take 1 tablet by mouth daily.     fluticasone (FLONASE) 50 MCG/ACT nasal spray Place 1 spray into both nostrils in the morning.     MAGNESIUM PO Take 1 tablet by mouth daily.     Multiple Vitamin (MULTIVITAMIN WITH MINERALS) TABS tablet Take 1 tablet by mouth 2 (two) times daily.     NURTEC 75 MG TBDP Take 75 mg by mouth daily as needed (migraine).     oxyCODONE-acetaminophen (PERCOCET) 10-325 MG tablet Take 1 tablet by mouth 5 (five) times daily as needed for pain.     pregabalin (LYRICA) 50 MG capsule Take 50 mg by mouth at bedtime.     Semaglutide,0.25 or 0.5MG /DOS, (OZEMPIC, 0.25 OR 0.5 MG/DOSE,) 2 MG/1.5ML SOPN Inject 0.5 mg into the skin once a week. (Patient taking differently: Inject 0.5 mg into the skin every Monday.) 4.5 mL 1   vitamin B-12 (CYANOCOBALAMIN) 100 MCG tablet Take 100 mcg by mouth daily.     ondansetron (ZOFRAN ODT) 4 MG disintegrating tablet Take 1 tablet (4 mg total) by mouth every 8 (eight) hours as needed for nausea or vomiting. 10 tablet 0    Results for orders placed or performed during the hospital encounter of 11/01/20 (from the past 48 hour(s))  Glucose, capillary     Status: None   Collection Time: 11/01/20 12:37 PM  Result Value Ref Range   Glucose-Capillary 99 70 - 99 mg/dL    Comment: Glucose reference range applies only to samples taken after fasting for at least 8 hours.   Comment 1 Notify RN    Comment 2 Document in Chart    No results found.  Review of Systems  All other systems reviewed and are negative.  Blood  pressure 117/78, pulse 69, temperature 98 F (36.7 C), temperature source Oral, resp. rate 15, height 5' 4.8" (1.646 m), weight (!) 139.8 kg, SpO2 100 %. Physical Exam Constitutional:      Appearance: She is obese.  HENT:     Head: Atraumatic.  Eyes:     Extraocular Movements: Extraocular movements intact.  Cardiovascular:     Pulses: Normal pulses.  Pulmonary:     Effort: Pulmonary effort is normal.  Musculoskeletal:  Comments: L shoulder pain with RC testing. TTP at [facility] joint.  Skin:    General: Skin is warm and dry.  Neurological:     Mental Status: She is alert.  Psychiatric:        Mood and Affect: Mood normal.     Assessment/Plan Left shoulder symptomatic full-thickness rotator cuff tear and AC joint arthropathy which has failed conservative management. Plan left shoulder arthroscopic rotator cuff repair, subacromial decompression, distal clavicle excision Risks / benefits of surgery discussed Consent on chart  NPO for OR Preop antibiotics   Berline Lopes, MD 11/01/2020, 1:33 PM

## 2020-11-01 NOTE — Progress Notes (Signed)
Assisted Dr. Foster with left, ultrasound guided, interscalene  block. Side rails up, monitors on throughout procedure. See vital signs in flow sheet. Tolerated Procedure well.  

## 2020-11-01 NOTE — Discharge Instructions (Signed)

## 2020-11-01 NOTE — Transfer of Care (Signed)
Immediate Anesthesia Transfer of Care Note  Patient: Angelica Ortiz  Procedure(s) Performed: SHOULDER ARTHROSCOPY WITH ROTATOR CUFF REPAIR AND SUBACROMIAL DECOMPRESSION, DISTAL CLAVICLE EXCISION (Left)  Patient Location: PACU  Anesthesia Type:General  Level of Consciousness: awake, alert  and oriented  Airway & Oxygen Therapy: Patient Spontanous Breathing and Patient connected to face mask oxygen  Post-op Assessment: Report given to RN and Post -op Vital signs reviewed and stable  Post vital signs: Reviewed and stable  Last Vitals:  Vitals Value Taken Time  BP 147/87 11/01/20 1605  Temp 36.4 C 11/01/20 1605  Pulse 68 11/01/20 1608  Resp 11 11/01/20 1608  SpO2 100 % 11/01/20 1608  Vitals shown include unvalidated device data.  Last Pain:  Vitals:   11/01/20 1320  TempSrc: Oral  PainSc:       Patients Stated Pain Goal: 3 (11/01/20 1248)  Complications: No notable events documented.

## 2020-11-02 ENCOUNTER — Encounter (HOSPITAL_COMMUNITY): Payer: Self-pay | Admitting: Orthopedic Surgery

## 2020-12-03 ENCOUNTER — Encounter: Payer: Self-pay | Admitting: Nurse Practitioner

## 2020-12-06 ENCOUNTER — Other Ambulatory Visit: Payer: Self-pay

## 2020-12-06 ENCOUNTER — Ambulatory Visit (INDEPENDENT_AMBULATORY_CARE_PROVIDER_SITE_OTHER): Payer: Medicare Other | Admitting: Nurse Practitioner

## 2020-12-06 ENCOUNTER — Encounter: Payer: Self-pay | Admitting: Nurse Practitioner

## 2020-12-06 ENCOUNTER — Ambulatory Visit (INDEPENDENT_AMBULATORY_CARE_PROVIDER_SITE_OTHER): Payer: Medicare Other

## 2020-12-06 VITALS — BP 130/82 | HR 71 | Temp 98.1°F | Ht 63.8 in | Wt 295.0 lb

## 2020-12-06 VITALS — BP 130/80 | HR 71 | Temp 98.1°F | Ht 63.0 in | Wt 295.0 lb

## 2020-12-06 DIAGNOSIS — Z800 Family history of malignant neoplasm of digestive organs: Secondary | ICD-10-CM

## 2020-12-06 DIAGNOSIS — Z1159 Encounter for screening for other viral diseases: Secondary | ICD-10-CM

## 2020-12-06 DIAGNOSIS — Z23 Encounter for immunization: Secondary | ICD-10-CM

## 2020-12-06 DIAGNOSIS — G43809 Other migraine, not intractable, without status migrainosus: Secondary | ICD-10-CM

## 2020-12-06 DIAGNOSIS — I10 Essential (primary) hypertension: Secondary | ICD-10-CM

## 2020-12-06 DIAGNOSIS — Z0000 Encounter for general adult medical examination without abnormal findings: Secondary | ICD-10-CM | POA: Diagnosis not present

## 2020-12-06 DIAGNOSIS — Z139 Encounter for screening, unspecified: Secondary | ICD-10-CM

## 2020-12-06 MED ORDER — SHINGRIX 50 MCG/0.5ML IM SUSR: 0.5000 mL | Freq: Once | INTRAMUSCULAR | 1 refills | Status: AC

## 2020-12-06 MED ORDER — SHINGRIX 50 MCG/0.5ML IM SUSR: 0.5000 mL | Freq: Once | INTRAMUSCULAR | 0 refills | Status: DC

## 2020-12-06 MED ORDER — NURTEC 75 MG PO TBDP: 75.0000 mg | ORAL_TABLET | Freq: Every day | ORAL | 2 refills | Status: DC | PRN

## 2020-12-06 MED ORDER — TETANUS-DIPHTHERIA TOXOIDS TD 2-2 LF/0.5ML IM SUSP: 0.5000 mL | Freq: Once | INTRAMUSCULAR | 0 refills | Status: AC

## 2020-12-06 NOTE — Patient Instructions (Signed)
Angelica Ortiz , Thank you for taking time to come for your Medicare Wellness Visit. I appreciate your ongoing commitment to your health goals. Please review the following plan we discussed and let me know if I can assist you in the future.   Screening recommendations/referrals: Colonoscopy: states had 2 yrs ago Mammogram: completed 12/30/2018 Bone Density: n/a Recommended yearly ophthalmology/optometry visit for glaucoma screening and checkup Recommended yearly dental visit for hygiene and checkup  Vaccinations: Influenza vaccine: today Pneumococcal vaccine: today Tdap vaccine: sent to pharmacy Shingles vaccine: discussed  Covid-19:  07/28/2020, 01/24/2020, 07/08/2019, 06/17/2019  Advanced directives: Advance directive discussed with you today. Even though you declined this today please call our office should you change your mind and we can give you the proper paperwork for you to fill out.  Conditions/risks identified: none  Next appointment: Follow up in one year for your annual wellness visit.   Preventive Care 40-64 Years, Female Preventive care refers to lifestyle choices and visits with your health care provider that can promote health and wellness. What does preventive care include? A yearly physical exam. This is also called an annual well check. Dental exams once or twice a year. Routine eye exams. Ask your health care provider how often you should have your eyes checked. Personal lifestyle choices, including: Daily care of your teeth and gums. Regular physical activity. Eating a healthy diet. Avoiding tobacco and drug use. Limiting alcohol use. Practicing safe sex. Taking low-dose aspirin daily starting at age 6. Taking vitamin and mineral supplements as recommended by your health care provider. What happens during an annual well check? The services and screenings done by your health care provider during your annual well check will depend on your age, overall health, lifestyle  risk factors, and family history of disease. Counseling  Your health care provider may ask you questions about your: Alcohol use. Tobacco use. Drug use. Emotional well-being. Home and relationship well-being. Sexual activity. Eating habits. Work and work Statistician. Method of birth control. Menstrual cycle. Pregnancy history. Screening  You may have the following tests or measurements: Height, weight, and BMI. Blood pressure. Lipid and cholesterol levels. These may be checked every 5 years, or more frequently if you are over 61 years old. Skin check. Lung cancer screening. You may have this screening every year starting at age 68 if you have a 30-pack-year history of smoking and currently smoke or have quit within the past 15 years. Fecal occult blood test (FOBT) of the stool. You may have this test every year starting at age 54. Flexible sigmoidoscopy or colonoscopy. You may have a sigmoidoscopy every 5 years or a colonoscopy every 10 years starting at age 32. Hepatitis C blood test. Hepatitis B blood test. Sexually transmitted disease (STD) testing. Diabetes screening. This is done by checking your blood sugar (glucose) after you have not eaten for a while (fasting). You may have this done every 1-3 years. Mammogram. This may be done every 1-2 years. Talk to your health care provider about when you should start having regular mammograms. This may depend on whether you have a family history of breast cancer. BRCA-related cancer screening. This may be done if you have a family history of breast, ovarian, tubal, or peritoneal cancers. Pelvic exam and Pap test. This may be done every 3 years starting at age 72. Starting at age 2, this may be done every 5 years if you have a Pap test in combination with an HPV test. Bone density scan. This is done  to screen for osteoporosis. You may have this scan if you are at high risk for osteoporosis. Discuss your test results, treatment options,  and if necessary, the need for more tests with your health care provider. Vaccines  Your health care provider may recommend certain vaccines, such as: Influenza vaccine. This is recommended every year. Tetanus, diphtheria, and acellular pertussis (Tdap, Td) vaccine. You may need a Td booster every 10 years. Zoster vaccine. You may need this after age 16. Pneumococcal 13-valent conjugate (PCV13) vaccine. You may need this if you have certain conditions and were not previously vaccinated. Pneumococcal polysaccharide (PPSV23) vaccine. You may need one or two doses if you smoke cigarettes or if you have certain conditions. Talk to your health care provider about which screenings and vaccines you need and how often you need them. This information is not intended to replace advice given to you by your health care provider. Make sure you discuss any questions you have with your health care provider. Document Released: 04/20/2015 Document Revised: 12/12/2015 Document Reviewed: 01/23/2015 Elsevier Interactive Patient Education  2017 [place] Prevention in the Home Falls can cause injuries. They can happen to people of all ages. There are many things you can do to make your home safe and to help prevent falls. What can I do on the outside of my home? Regularly fix the edges of walkways and driveways and fix any cracks. Remove anything that might make you trip as you walk through a door, such as a raised step or threshold. Trim any bushes or trees on the path to your home. Use bright outdoor lighting. Clear any walking paths of anything that might make someone trip, such as rocks or tools. Regularly check to see if handrails are loose or broken. Make sure that both sides of any steps have handrails. Any raised decks and porches should have guardrails on the edges. Have any leaves, snow, or ice cleared regularly. Use sand or salt on walking paths during winter. Clean up any spills in  your garage right away. This includes oil or grease spills. What can I do in the bathroom? Use night lights. Install grab bars by the toilet and in the tub and shower. Do not use towel bars as grab bars. Use non-skid mats or decals in the tub or shower. If you need to sit down in the shower, use a plastic, non-slip stool. Keep the floor dry. Clean up any water that spills on the floor as soon as it happens. Remove soap buildup in the tub or shower regularly. Attach bath mats securely with double-sided non-slip rug tape. Do not have throw rugs and other things on the floor that can make you trip. What can I do in the bedroom? Use night lights. Make sure that you have a light by your bed that is easy to reach. Do not use any sheets or blankets that are too big for your bed. They should not hang down onto the floor. Have a firm chair that has side arms. You can use this for support while you get dressed. Do not have throw rugs and other things on the floor that can make you trip. What can I do in the kitchen? Clean up any spills right away. Avoid walking on wet floors. Keep items that you use a lot in easy-to-reach places. If you need to reach something above you, use a strong step stool that has a grab bar. Keep electrical cords out of the  way. Do not use floor polish or wax that makes floors slippery. If you must use wax, use non-skid floor wax. Do not have throw rugs and other things on the floor that can make you trip. What can I do with my stairs? Do not leave any items on the stairs. Make sure that there are handrails on both sides of the stairs and use them. Fix handrails that are broken or loose. Make sure that handrails are as long as the stairways. Check any carpeting to make sure that it is firmly attached to the stairs. Fix any carpet that is loose or worn. Avoid having throw rugs at the top or bottom of the stairs. If you do have throw rugs, attach them to the floor with carpet  tape. Make sure that you have a light switch at the top of the stairs and the bottom of the stairs. If you do not have them, ask someone to add them for you. What else can I do to help prevent falls? Wear shoes that: Do not have high heels. Have rubber bottoms. Are comfortable and fit you well. Are closed at the toe. Do not wear sandals. If you use a stepladder: Make sure that it is fully opened. Do not climb a closed stepladder. Make sure that both sides of the stepladder are locked into place. Ask someone to hold it for you, if possible. Clearly mark and make sure that you can see: Any grab bars or handrails. First and last steps. Where the edge of each step is. Use tools that help you move around (mobility aids) if they are needed. These include: Canes. Walkers. Scooters. Crutches. Turn on the lights when you go into a dark area. Replace any light bulbs as soon as they burn out. Set up your furniture so you have a clear path. Avoid moving your furniture around. If any of your floors are uneven, fix them. If there are any pets around you, be aware of where they are. Review your medicines with your doctor. Some medicines can make you feel dizzy. This can increase your chance of falling. Ask your doctor what other things that you can do to help prevent falls. This information is not intended to replace advice given to you by your health care provider. Make sure you discuss any questions you have with your health care provider. Document Released: 01/18/2009 Document Revised: 08/30/2015 Document Reviewed: 04/28/2014 Elsevier Interactive Patient Education  2017 [employer].

## 2020-12-06 NOTE — Patient Instructions (Signed)

## 2020-12-06 NOTE — Progress Notes (Signed)
I,Tyreonna Czaplicki,acting as a Neurosurgeon for Arnette Felts, FNP.,have documented all relevant documentation on the behalf of Arnette Felts, FNP,as directed by  Arnette Felts, FNP while in the presence of Arnette Felts, FNP.   This visit occurred during the SARS-CoV-2 public health emergency.  Safety protocols were in place, including screening questions prior to the visit, additional usage of staff PPE, and extensive cleaning of exam room while observing appropriate contact time as indicated for disinfecting solutions.  Subjective:     Patient ID: Angelica Ortiz , female    DOB: 28-Apr-1965 , 55 y.o.   MRN: [id number]   Chief Complaint  Patient presents with   Hypertension    HPI  Pt presents today for BPC. She had left shoulder surgery 5 weeks ago and is in rehab. No active range of motion at this time. Her pain is much better. Overall doing well.   Wt Readings from Last 3 Encounters: 12/06/20 : 295 lb (133.8 kg) 12/06/20 : 295 lb (133.8 kg) 11/01/20 : (!) 308 lb 3.2 oz (139.8 kg)  Her initial weight with her first visit with me was 316 lbs, she is taking ozempic for prediabetes. She reports she does not have an appetite and no craving for sweets. She feels full quickly.    Hypertension This is a chronic problem. The current episode started more than 1 year ago. The problem is controlled. Pertinent negatives include no anxiety, chest pain, headaches (she has tried rizatriptan, amovig), palpitations or shortness of breath. Risk factors for coronary artery disease include obesity and sedentary lifestyle.  Other This is a chronic problem. Pertinent negatives include no chest pain or headaches (she has tried rizatriptan, amovig).    Past Medical History:  Diagnosis Date   Anemia    Arthritis    Asthma    Depression    Diabetes mellitus without complication (HCC)    Type 2   Diverticulitis    Falls    Fibromyalgia    Gait instability    GERD (gastroesophageal reflux disease)    History of  urinary tract infection    Hypertension    Lupus (HCC)    no meds   Migraine    Morbid obesity 03/30/2013   Overactive bladder    Overactive bladder    Pulmonary embolism (HCC)    Resolved -At age 69 broken right leg, blood clot broke off - into lung   Seasonal allergies    Sleep apnea 06/05/2013   Does not have CPAP machine   Urinary incontinence      Family History  Problem Relation Age of Onset   Diabetes Mother    Hypertension Mother    Heart disease Mother    Cancer Father    Heart disease Father    Hypertension Sister    Cancer Sister    Diabetes Brother    Hypertension Brother    Cancer Maternal Uncle    Cancer Paternal Uncle    Breast cancer Neg Hx      Current Outpatient Medications:    albuterol (PROVENTIL HFA;VENTOLIN HFA) 108 (90 BASE) MCG/ACT inhaler, Inhale 2 puffs into the lungs every 6 (six) hours as needed for shortness of breath or wheezing., Disp: , Rfl:    BIOTIN PO, Take 1 tablet by mouth daily., Disp: , Rfl:    brimonidine (ALPHAGAN) 0.2 % ophthalmic solution, Place 1 drop into both eyes in the morning and at bedtime., Disp: , Rfl:    budesonide-formoterol (SYMBICORT) 160-4.5 MCG/ACT inhaler, Inhale  2 puffs into the lungs daily., Disp: , Rfl:    CALCIUM PO, Take 1 tablet by mouth daily., Disp: , Rfl:    diclofenac Sodium (VOLTAREN) 1 % GEL, Apply 2 g topically daily as needed for pain., Disp: , Rfl:    DULoxetine (CYMBALTA) 30 MG capsule, Take 30 mg by mouth daily., Disp: , Rfl:    ELDERBERRY PO, Take 1 tablet by mouth daily., Disp: , Rfl:    fluticasone (FLONASE) 50 MCG/ACT nasal spray, Place 1 spray into both nostrils in the morning., Disp: , Rfl:    MAGNESIUM PO, Take 1 tablet by mouth daily., Disp: , Rfl:    Multiple Vitamin (MULTIVITAMIN WITH MINERALS) TABS tablet, Take 1 tablet by mouth 2 (two) times daily., Disp: , Rfl:    NURTEC 75 MG TBDP, Take 75 mg by mouth daily as needed (migraine)., Disp: 30 tablet, Rfl: 2   ondansetron (ZOFRAN ODT) 4  MG disintegrating tablet, Take 1 tablet (4 mg total) by mouth every 8 (eight) hours as needed for nausea or vomiting. (Patient not taking: Reported on 12/06/2020), Disp: 10 tablet, Rfl: 0   oxyCODONE-acetaminophen (PERCOCET) 5-325 MG tablet, Take 1-2 tablets by mouth every 6 (six) hours as needed for severe pain., Disp: 30 tablet, Rfl: 0   pregabalin (LYRICA) 50 MG capsule, Take 50 mg by mouth at bedtime., Disp: , Rfl:    Semaglutide,0.25 or 0.5MG /DOS, (OZEMPIC, 0.25 OR 0.5 MG/DOSE,) 2 MG/1.5ML SOPN, Inject 0.5 mg into the skin once a week. (Patient taking differently: Inject 0.5 mg into the skin every Monday.), Disp: 4.5 mL, Rfl: 1   tiZANidine (ZANAFLEX) 2 MG tablet, Take 1 tablet (2 mg total) by mouth every 8 (eight) hours as needed for muscle spasms. (Patient not taking: Reported on 12/06/2020), Disp: 20 tablet, Rfl: 0   vitamin B-12 (CYANOCOBALAMIN) 100 MCG tablet, Take 100 mcg by mouth daily., Disp: , Rfl:    Allergies  Allergen Reactions   Dilaudid [Hydromorphone Hcl] Hives and Itching   Iodinated Diagnostic Agents Hives and Itching    Pt given 4 hour prep 06/21/20, c/o itching after injection- [facility]   Levaquin [Levofloxacin] Anaphylaxis   Tangerine Flavor Swelling    Tangerine fruit: Lip and tongue swelling with tangerines   Ace Inhibitors Swelling   Iodine-131 Itching and Rash     Review of Systems  Constitutional: Negative.   Respiratory: Negative.  Negative for shortness of breath.   Cardiovascular: Negative.  Negative for chest pain and palpitations.  Neurological: Negative.  Negative for dizziness and headaches (she has tried rizatriptan, amovig).  Psychiatric/Behavioral: Negative.      Today's Vitals   12/06/20 1057  BP: 130/80  Pulse: 71  Temp: 98.1 F (36.7 C)  SpO2: 99%  Weight: 295 lb (133.8 kg)  Height: 5\' 3"  (1.6 m)   Body mass index is 52.26 kg/m.   Objective:  Physical Exam Vitals reviewed.  Constitutional:      General: She is not in acute distress.     Appearance: She is well-developed. She is obese.  HENT:     Head: Normocephalic and atraumatic.  Eyes:     Pupils: Pupils are equal, round, and reactive to light.  Cardiovascular:     Rate and Rhythm: Normal rate and regular rhythm.     Pulses: Normal pulses.     Heart sounds: Normal heart sounds. No murmur heard. Pulmonary:     Effort: Pulmonary effort is normal. No respiratory distress.     Breath sounds: Normal  breath sounds. No wheezing.  Skin:    General: Skin is warm and dry.     Capillary Refill: Capillary refill takes less than 2 seconds.  Neurological:     General: No focal deficit present.     Mental Status: She is alert and oriented to person, place, and time.     Cranial Nerves: No cranial nerve deficit.     Motor: No weakness.  Psychiatric:        Mood and Affect: Mood normal.        Behavior: Behavior normal.        Thought Content: Thought content normal.        Judgment: Judgment normal.        Assessment And Plan:     1. Hypertension, well controlled Comments: Blood pressure is controlled Continue current medications  2. Other migraine without status migrainosus, not intractable Comments: More frequent headaches, will try her on nurtec as needed Encouraged to keep a headache log - NURTEC 75 MG TBDP; Take 75 mg by mouth daily as needed (migraine).  Dispense: 30 tablet; Refill: 2  3. Family history of colon cancer in father  4. Encounter for hepatitis C screening test for low risk patient Will check Hepatitis C screening due to recent recommendations to screen all adults 18 years and older - Hepatitis C antibody  5. Encounter for screening Comments: will check for MMR immunity and varicella  - Measles/Mumps/Rubella Immunity - Varicella zoster antibody, IgG  6. Encounter for immunization - Zoster Vaccine Adjuvanted [facility]) injection; Inject 0.5 mLs into the muscle once for 1 dose.  Dispense: 0.5 mL; Refill: 1    Patient was given opportunity to  ask questions. Patient verbalized understanding of the plan and was able to repeat key elements of the plan. All questions were answered to their satisfaction.  Arnette Felts, FNP   I, Arnette Felts, FNP, have reviewed all documentation for this visit. The documentation on 01/04/21 for the exam, diagnosis, procedures, and orders are all accurate and complete.   IF YOU HAVE BEEN REFERRED TO A SPECIALIST, IT MAY TAKE 1-2 WEEKS TO SCHEDULE/PROCESS THE REFERRAL. IF YOU HAVE NOT HEARD FROM US/SPECIALIST IN TWO WEEKS, PLEASE GIVE [place] A CALL AT [phone].   THE PATIENT IS ENCOURAGED TO PRACTICE SOCIAL DISTANCING DUE TO THE COVID-19 PANDEMIC.

## 2020-12-06 NOTE — Progress Notes (Signed)
This visit occurred during the SARS-CoV-2 public health emergency.  Safety protocols were in place, including screening questions prior to the visit, additional usage of staff PPE, and extensive cleaning of exam room while observing appropriate contact time as indicated for disinfecting solutions.  Subjective:   Angelica Ortiz is a 55 y.o. female who presents for an Initial Medicare Annual Wellness Visit.  Review of Systems     Cardiac Risk Factors include: diabetes mellitus;hypertension;obesity (BMI >30kg/m2);sedentary lifestyle     Objective:    Today's Vitals   12/06/20 1041 12/06/20 1049  BP: 130/82   Pulse: 71   Temp: 98.1 F (36.7 C)   TempSrc: Oral   SpO2: 97%   Weight: 295 lb (133.8 kg)   Height: 5' 3.8" (1.621 m)   PainSc:  4    Body mass index is 50.95 kg/m.  Advanced Directives 12/06/2020 11/01/2020 10/30/2020 06/20/2020 03/13/2015 03/13/2015 03/09/2015  Does Patient Have a Medical Advance Directive? No No No No No - No  Would patient like information on creating a medical advance directive? No - Patient declined No - Patient declined - No - Patient declined No - patient declined information (No Data) Yes - Educational materials given  Pre-existing out of facility DNR order (yellow form or pink MOST form) - - - - - - -    Current Medications (verified) Outpatient Encounter Medications as of 12/06/2020  Medication Sig   albuterol (PROVENTIL HFA;VENTOLIN HFA) 108 (90 BASE) MCG/ACT inhaler Inhale 2 puffs into the lungs every 6 (six) hours as needed for shortness of breath or wheezing.   BIOTIN PO Take 1 tablet by mouth daily.   brimonidine (ALPHAGAN) 0.2 % ophthalmic solution Place 1 drop into both eyes in the morning and at bedtime.   budesonide-formoterol (SYMBICORT) 160-4.5 MCG/ACT inhaler Inhale 2 puffs into the lungs daily.   CALCIUM PO Take 1 tablet by mouth daily.   diclofenac Sodium (VOLTAREN) 1 % GEL Apply 2 g topically daily as needed for pain.   DULoxetine (CYMBALTA)  30 MG capsule Take 30 mg by mouth daily.   ELDERBERRY PO Take 1 tablet by mouth daily.   fluticasone (FLONASE) 50 MCG/ACT nasal spray Place 1 spray into both nostrils in the morning.   MAGNESIUM PO Take 1 tablet by mouth daily.   Multiple Vitamin (MULTIVITAMIN WITH MINERALS) TABS tablet Take 1 tablet by mouth 2 (two) times daily.   NURTEC 75 MG TBDP Take 75 mg by mouth daily as needed (migraine).   oxyCODONE-acetaminophen (PERCOCET) 5-325 MG tablet Take 1-2 tablets by mouth every 6 (six) hours as needed for severe pain.   pregabalin (LYRICA) 50 MG capsule Take 50 mg by mouth at bedtime.   Semaglutide,0.25 or 0.5MG /DOS, (OZEMPIC, 0.25 OR 0.5 MG/DOSE,) 2 MG/1.5ML SOPN Inject 0.5 mg into the skin once a week. (Patient taking differently: Inject 0.5 mg into the skin every Monday.)   vitamin B-12 (CYANOCOBALAMIN) 100 MCG tablet Take 100 mcg by mouth daily.   ondansetron (ZOFRAN ODT) 4 MG disintegrating tablet Take 1 tablet (4 mg total) by mouth every 8 (eight) hours as needed for nausea or vomiting. (Patient not taking: Reported on 12/06/2020)   tiZANidine (ZANAFLEX) 2 MG tablet Take 1 tablet (2 mg total) by mouth every 8 (eight) hours as needed for muscle spasms. (Patient not taking: Reported on 12/06/2020)   No facility-administered encounter medications on file as of 12/06/2020.    Allergies (verified) Dilaudid [hydromorphone hcl], Iodinated diagnostic agents, Levaquin [levofloxacin], Tangerine flavor, Ace inhibitors,  and Iodine-131   History: Past Medical History:  Diagnosis Date   Anemia    Arthritis    Asthma    Depression    Diabetes mellitus without complication (HCC)    Type 2   Diverticulitis    Falls    Fibromyalgia    Gait instability    GERD (gastroesophageal reflux disease)    History of urinary tract infection    Hypertension    Lupus (HCC)    no meds   Migraine    Morbid obesity 03/30/2013   Overactive bladder    Overactive bladder    Pulmonary embolism (HCC)     Resolved -At age 55 broken right leg, blood clot broke off - into lung   Seasonal allergies    Sleep apnea 06/05/2013   Does not have CPAP machine   Urinary incontinence    Past Surgical History:  Procedure Laterality Date   ABDOMINAL HYSTERECTOMY     APPENDECTOMY     CESAREAN SECTION  04/08/1999   x 1   CHOLECYSTECTOMY     DILATION AND CURETTAGE OF UTERUS     x 3 for AUB   GASTRIC ROUX-EN-Y N/A 03/13/2015   Procedure: LAPAROSCOPIC ROUX-EN-Y GASTRIC BYPASS  ENTEROLYSIS OF ADHESIONS WITH UPPER ENDOSCOPY;  Surgeon: Ovidio Kinavid Newman, MD;  Location: [facility];  Service: General;  Laterality: N/A;   HYSTEROSCOPY WITH NOVASURE N/A 03/14/2014   Procedure: HYSTEROSCOPY WITH NOVASURE;  Surgeon: Willodean Rosenthalarolyn Harraway-Smith, MD;  Location: [facility];  Service: Gynecology;  Laterality: N/A;   LAMINECTOMY     L4-L5   LAPAROSCOPIC LYSIS OF ADHESIONS  03/13/2015   Procedure: LAPAROSCOPIC LYSIS OF ADHESIONS;  Surgeon: Ovidio Kinavid Newman, MD;  Location: [facility];  Service: General;;   LAPAROSCOPIC OOPHERECTOMY Right 09/05/2014   Procedure: abdominal OOPHERECTOMY;  Surgeon: Willodean Rosenthalarolyn Harraway-Smith, MD;  Location: [facility];  Service: Gynecology;  Laterality: Right;   LAPAROSCOPIC OVARIAN CYSTECTOMY Right 04/07/1992   ORIF ANKLE FRACTURE Left    OVARIAN CYST REMOVAL Left 09/05/2014   Procedure: OVARIAN CYSTECTOMY and portion of left ovary removed ;  Surgeon: Willodean Rosenthalarolyn Harraway-Smith, MD;  Location: [facility];  Service: Gynecology;  Laterality: Left;   ROTATOR CUFF REPAIR     right   ROTATOR CUFF REPAIR Right    SALPINGOOPHORECTOMY Bilateral 09/05/2014   Procedure: BILATERAL SALPINGECTOMY;  Surgeon: Willodean Rosenthalarolyn Harraway-Smith, MD;  Location: [facility];  Service: Gynecology;  Laterality: Bilateral;   SHOULDER ARTHROSCOPY WITH ROTATOR CUFF REPAIR AND SUBACROMIAL DECOMPRESSION Left 11/01/2020   Procedure: SHOULDER ARTHROSCOPY WITH ROTATOR CUFF REPAIR AND SUBACROMIAL DECOMPRESSION, DISTAL CLAVICLE EXCISION;  Surgeon: Jones Broomhandler, Justin, MD;   Location: [facility];  Service: Orthopedics;  Laterality: Left;   SUPRACERVICAL ABDOMINAL HYSTERECTOMY N/A 09/05/2014   Procedure: HYSTERECTOMY SUPRACERVICAL ABDOMINAL;  Surgeon: Willodean Rosenthalarolyn Harraway-Smith, MD;  Location: [facility];  Service: Gynecology;  Laterality: N/A;   TONSILLECTOMY     UPPER GI ENDOSCOPY  03/13/2015   Procedure: UPPER GI ENDOSCOPY;  Surgeon: Ovidio Kinavid Newman, MD;  Location: [facility];  Service: General;;   Family History  Problem Relation Age of Onset   Diabetes Mother    Hypertension Mother    Heart disease Mother    Cancer Father    Heart disease Father    Hypertension Sister    Cancer Sister    Diabetes Brother    Hypertension Brother    Cancer Maternal Uncle    Cancer Paternal Uncle    Breast cancer Neg Hx    Social History   Socioeconomic History  Marital status: Divorced    Spouse name: Not on file   Number of children: Not on file   Years of education: Not on file   Highest education level: Not on file  Occupational History   Not on file  Tobacco Use   Smoking status: Never   Smokeless tobacco: Never  Vaping Use   Vaping Use: Never used  Substance and Sexual Activity   Alcohol use: No   Drug use: No   Sexual activity: Not Currently    Birth control/protection: None  Other Topics Concern   Not on file  Social History Narrative   Not on file   Social Determinants of Health   Financial Resource Strain: Low Risk    Difficulty of Paying Living Expenses: Not hard at all  Food Insecurity: No Food Insecurity   Worried About Programme researcher, broadcasting/film/video in the Last Year: Never true   Ran Out of Food in the Last Year: Never true  Transportation Needs: No Transportation Needs   Lack of Transportation (Medical): No   Lack of Transportation (Non-Medical): No  Physical Activity: Inactive   Days of Exercise per Week: 0 days   Minutes of Exercise per Session: 0 min  Stress: No Stress Concern Present   Feeling of Stress : Not at all  Social Connections: Not on file     Tobacco Counseling Counseling given: Not Answered   Clinical Intake:  Pre-visit preparation completed: Yes  Pain : 0-10 Pain Score: 4  Pain Type: Acute pain Pain Location: Shoulder Pain Orientation: Left Pain Descriptors / Indicators: Aching, Throbbing Pain Onset: More than a month ago Pain Frequency: Intermittent Pain Relieving Factors: ice, APAP  Pain Relieving Factors: ice, APAP  Nutritional Status: BMI > 30  Obese Nutritional Risks: None Diabetes: Yes  How often do you need to have someone help you when you read instructions, pamphlets, or other written materials from your doctor or pharmacy?: 1 - Never What is the last grade level you completed in school?: college  Diabetic? Yes Nutrition Risk Assessment:  Has the patient had any N/V/D within the last 2 months?  No  Does the patient have any non-healing wounds?  No  Has the patient had any unintentional weight loss or weight gain?  No   Diabetes:  Is the patient diabetic?  Yes  If diabetic, was a CBG obtained today?  No  Did the patient bring in their glucometer from home?  No  How often do you monitor your CBG's? Not checking.   Financial Strains and Diabetes Management:  Are you having any financial strains with the device, your supplies or your medication? No .  Does the patient want to be seen by Chronic Care Management for management of their diabetes?  No  Would the patient like to be referred to a Nutritionist or for Diabetic Management?  No   Diabetic Exams:  Diabetic Eye Exam: Overdue for diabetic eye exam. Pt has been advised about the importance in completing this exam. Patient advised to call and schedule an eye exam. Diabetic Foot Exam: Overdue, Pt has been advised about the importance in completing this exam. Pt is scheduled for diabetic foot exam on next appointment.   Interpreter Needed?: No  Information entered by :: NAllen LPN   Activities of Daily Living In your present state of  health, do you have any difficulty performing the following activities: 12/06/2020 10/30/2020  Hearing? N N  Vision? Y N  Comment can't see distance -  Difficulty concentrating or making decisions? N N  Walking or climbing stairs? Y Y  Dressing or bathing? Y Y  Comment due to arm -  Doing errands, shopping? Y N  Comment due to arm -  Preparing Food and eating ? N -  Using the Toilet? N -  In the past six months, have you accidently leaked urine? N -  Do you have problems with loss of bowel control? N -  Managing your Medications? N -  Managing your Finances? N -  Housekeeping or managing your Housekeeping? Y -  Some recent data might be hidden    Patient Care Team: Arnette Felts, FNP as PCP - General (General Practice)  Indicate any recent Medical Services you may have received from other than [facility] providers in the past year (date may be approximate).     Assessment:   This is a routine wellness examination for Angelica Ortiz.  Hearing/Vision screen No results found.  Dietary issues and exercise activities discussed: Current Exercise Habits: The patient does not participate in regular exercise at present   Goals Addressed             This Visit's Progress    Patient Stated       12/06/2020, continue to lose weight (185 pounds), heal arm       Depression Screen PHQ 2/9 Scores 12/06/2020 10/18/2020 02/11/2016 10/29/2015 08/27/2015 06/26/2015 05/08/2015  PHQ - 2 Score 0 3 0 0 0 0 0  PHQ- 9 Score - 15 - - - - -    Fall Risk Fall Risk  12/06/2020 02/11/2016 10/29/2015 08/27/2015 06/26/2015  Falls in the past year? 1 No No No No  Comment knee gives out - - - -  Number falls in past yr: 1 - - - -  Injury with Fall? 0 - - - -  Risk Factor Category  - - - - -  Risk for fall due to : History of fall(s);Impaired mobility;Medication side effect - - - -  Follow up Falls evaluation completed;Education provided;Falls prevention discussed - - - -    FALL RISK PREVENTION PERTAINING TO THE  HOME:  Any stairs in or around the home? Yes  If so, are there any without handrails? No  Home free of loose throw rugs in walkways, pet beds, electrical cords, etc? Yes  Adequate lighting in your home to reduce risk of falls? Yes   ASSISTIVE DEVICES UTILIZED TO PREVENT FALLS:  Life alert? No  Use of a cane, walker or w/c? No  Grab bars in the bathroom? Yes  Shower chair or bench in shower? No  Elevated toilet seat or a handicapped toilet? No   TIMED UP AND GO:  Was the test performed? No .    Gait slow and steady without use of assistive device  Cognitive Function:     6CIT Screen 12/06/2020  What Year? 0 points  What month? 0 points  What time? 0 points  Count back from 20 0 points  Months in reverse 0 points  Repeat phrase 0 points  Total Score 0    Immunizations Immunization History  Administered Date(s) Administered   PFIZER(Purple Top)SARS-COV-2 Vaccination 06/17/2019, 07/08/2019, 01/24/2020, 07/28/2020    TDAP status: Due, Education has been provided regarding the importance of this vaccine. Advised may receive this vaccine at local pharmacy or Health Dept. Aware to provide a copy of the vaccination record if obtained from local pharmacy or Health Dept. Verbalized acceptance and understanding.  Flu Vaccine status:  Due, Education has been provided regarding the importance of this vaccine. Advised may receive this vaccine at local pharmacy or Health Dept. Aware to provide a copy of the vaccination record if obtained from local pharmacy or Health Dept. Verbalized acceptance and understanding.  Pneumococcal vaccine status: Completed during today's visit.  Covid-19 vaccine status: Completed vaccines  Qualifies for Shingles Vaccine? Yes   Zostavax completed No   Shingrix Completed?: No.    Education has been provided regarding the importance of this vaccine. Patient has been advised to call insurance company to determine out of pocket expense if they have not yet  received this vaccine. Advised may also receive vaccine at local pharmacy or Health Dept. Verbalized acceptance and understanding.  Screening Tests Health Maintenance  Topic Date Due   PNEUMOCOCCAL POLYSACCHARIDE VACCINE AGE 31-64 HIGH RISK  Never done   FOOT EXAM  Never done   OPHTHALMOLOGY EXAM  Never done   URINE MICROALBUMIN  Never done   Hepatitis C Screening  Never done   TETANUS/TDAP  Never done   COLONOSCOPY (Pts 45-69yrs Insurance coverage will need to be confirmed)  Never done   PAP SMEAR-Modifier  06/06/2016   Zoster Vaccines- Shingrix (1 of 2) Never done   INFLUENZA VACCINE  Never done   MAMMOGRAM  12/29/2020   HEMOGLOBIN A1C  04/20/2021   COVID-19 Vaccine  Completed   HIV Screening  Completed   Pneumococcal Vaccine 45-35 Years old  Aged Out   HPV VACCINES  Aged Out    Health Maintenance  Health Maintenance Due  Topic Date Due   PNEUMOCOCCAL POLYSACCHARIDE VACCINE AGE 31-64 HIGH RISK  Never done   FOOT EXAM  Never done   OPHTHALMOLOGY EXAM  Never done   URINE MICROALBUMIN  Never done   Hepatitis C Screening  Never done   TETANUS/TDAP  Never done   COLONOSCOPY (Pts 45-32yrs Insurance coverage will need to be confirmed)  Never done   PAP SMEAR-Modifier  06/06/2016   Zoster Vaccines- Shingrix (1 of 2) Never done   INFLUENZA VACCINE  Never done    Colorectal cancer screening: states had  Mammogram status: Completed 12/30/2018. Repeat every year  Bone Density status: n/a  Lung Cancer Screening: (Low Dose CT Chest recommended if Age 73-80 years, 30 pack-year currently smoking OR have quit w/in 15years.) does not qualify.   Lung Cancer Screening Referral: no  Additional Screening:  Hepatitis C Screening: does qualify; Completed today  Vision Screening: Recommended annual ophthalmology exams for early detection of glaucoma and other disorders of the eye. Is the patient up to date with their annual eye exam?  No  Who is the provider or what is the name of the  office in which the patient attends annual eye exams? [facility] If pt is not established with a provider, would they like to be referred to a provider to establish care? No .   Dental Screening: Recommended annual dental exams for proper oral hygiene  Community Resource Referral / Chronic Care Management: CRR required this visit?  No   CCM required this visit?  No      Plan:     I have personally reviewed and noted the following in the patient's chart:   Medical and social history Use of alcohol, tobacco or illicit drugs  Current medications and supplements including opioid prescriptions. Patient is currently taking opioid prescriptions. Information provided to patient regarding non-opioid alternatives. Patient advised to discuss non-opioid treatment plan with their provider. Functional ability and  status Nutritional status Physical activity Advanced directives List of other physicians Hospitalizations, surgeries, and ER visits in previous 12 months Vitals Screenings to include cognitive, depression, and falls Referrals and appointments  In addition, I have reviewed and discussed with patient certain preventive protocols, quality metrics, and best practice recommendations. A written personalized care plan for preventive services as well as general preventive health recommendations were provided to patient.     Barb Merino, LPN   05/12/9561   Nurse Notes:

## 2020-12-07 ENCOUNTER — Telehealth: Payer: Self-pay

## 2020-12-07 LAB — VARICELLA ZOSTER ANTIBODY, IGG: Varicella zoster IgG: 897 index (ref >165)

## 2020-12-07 LAB — HEPATITIS C ANTIBODY: Hep C Virus Ab: <0.1 s/co ratio (ref 0.0–0.9)

## 2020-12-07 LAB — MEASLES/MUMPS/RUBELLA IMMUNITY
MUMPS ABS, IGG: <9 AU/mL — ABNORMAL LOW (ref >10.9)
RUBEOLA AB, IGG: 29.9 AU/mL (ref >16.4)
Rubella Antibodies, IGG: 22.7 index (ref >0.99)

## 2020-12-07 NOTE — Telephone Encounter (Signed)
Prior auth done for [employer]urtec waiting on a response from the [employer].

## 2020-12-17 ENCOUNTER — Other Ambulatory Visit: Payer: Self-pay | Admitting: Nurse Practitioner

## 2020-12-17 DIAGNOSIS — Z79899 Other long term (current) drug therapy: Secondary | ICD-10-CM

## 2021-01-04 DIAGNOSIS — G43909 Migraine, unspecified, not intractable, without status migrainosus: Secondary | ICD-10-CM | POA: Insufficient documentation

## 2021-01-25 ENCOUNTER — Encounter: Payer: Self-pay | Admitting: Nurse Practitioner

## 2021-01-25 LAB — HM DIABETES EYE EXAM

## 2021-02-04 ENCOUNTER — Encounter: Payer: Self-pay | Admitting: Nurse Practitioner

## 2021-02-07 ENCOUNTER — Ambulatory Visit: Payer: Medicare Other | Admitting: Nurse Practitioner

## 2021-03-06 ENCOUNTER — Other Ambulatory Visit: Payer: Self-pay | Admitting: Physician Assistant

## 2021-03-06 DIAGNOSIS — M5412 Radiculopathy, cervical region: Secondary | ICD-10-CM

## 2021-03-26 ENCOUNTER — Encounter: Payer: Self-pay | Admitting: Nurse Practitioner

## 2021-03-26 ENCOUNTER — Ambulatory Visit (INDEPENDENT_AMBULATORY_CARE_PROVIDER_SITE_OTHER): Payer: Medicare Other | Admitting: Nurse Practitioner

## 2021-03-26 ENCOUNTER — Other Ambulatory Visit: Payer: Self-pay

## 2021-03-26 VITALS — BP 128/70 | HR 68 | Temp 98.5°F | Ht 64.4 in | Wt 285.2 lb

## 2021-03-26 DIAGNOSIS — Z6841 Body Mass Index (BMI) 40.0 and over, adult: Secondary | ICD-10-CM

## 2021-03-26 DIAGNOSIS — M797 Fibromyalgia: Secondary | ICD-10-CM | POA: Diagnosis not present

## 2021-03-26 DIAGNOSIS — L509 Urticaria, unspecified: Secondary | ICD-10-CM | POA: Diagnosis not present

## 2021-03-26 DIAGNOSIS — I10 Essential (primary) hypertension: Secondary | ICD-10-CM | POA: Diagnosis not present

## 2021-03-26 DIAGNOSIS — E2839 Other primary ovarian failure: Secondary | ICD-10-CM

## 2021-03-26 DIAGNOSIS — E118 Type 2 diabetes mellitus with unspecified complications: Secondary | ICD-10-CM | POA: Diagnosis not present

## 2021-03-26 DIAGNOSIS — G43009 Migraine without aura, not intractable, without status migrainosus: Secondary | ICD-10-CM | POA: Diagnosis not present

## 2021-03-26 DIAGNOSIS — M329 Systemic lupus erythematosus, unspecified: Secondary | ICD-10-CM

## 2021-03-26 MED ORDER — OZEMPIC (0.25 OR 0.5 MG/DOSE) 2 MG/1.5ML ~~LOC~~ SOPN: 0.5000 mg | PEN_INJECTOR | SUBCUTANEOUS | 1 refills | Status: DC

## 2021-03-26 NOTE — Progress Notes (Signed)
I,Tianna Badgett,acting as a Education administrator for [employer], FNP.,have documented all relevant documentation on the behalf of Minette Brine, FNP,as directed by  Minette Brine, FNP while in the presence of Minette Brine, [place].  This visit occurred during the SARS-CoV-2 public health emergency.  Safety protocols were in place, including screening questions prior to the visit, additional usage of staff PPE, and extensive cleaning of exam room while observing appropriate contact time as indicated for disinfecting solutions.  Subjective:     Patient ID: Angelica Ortiz , female    DOB: [birth year] , 55 y.o.   MRN: [id number]   Chief Complaint  Patient presents with   Hypertension    HPI  Pt presents today for BPC. She has complaints of joint pains all over her body.   Wt Readings from Last 3 Encounters: 03/26/21 : 285 lb 3.2 oz (129.4 kg) 12/06/20 : 295 lb (133.8 kg) 12/06/20 : 295 lb (133.8 kg)    Hypertension This is a chronic problem. The current episode started more than 1 year ago. The problem is controlled. Pertinent negatives include no anxiety, chest pain, headaches (she has tried rizatriptan, amovig), palpitations or shortness of breath. Risk factors for coronary artery disease include obesity and sedentary lifestyle.  Other This is a chronic problem. Associated symptoms include arthralgias. Pertinent negatives include no chest pain or headaches (she has tried rizatriptan, amovig).    Past Medical History:  Diagnosis Date   Anemia    Arthritis    Asthma    Depression    Diabetes mellitus without complication (HCC)    Type 2   Diverticulitis    Falls    Fibromyalgia    Gait instability    GERD (gastroesophageal reflux disease)    History of urinary tract infection    Hypertension    Lupus (HCC)    no meds   Migraine    Morbid obesity 03/30/2013   Overactive bladder    Overactive bladder    Pulmonary embolism (HCC)    Resolved -At age 61 broken right leg, blood clot broke off -  into lung   Seasonal allergies    Sleep apnea 06/05/2013   Does not have CPAP machine   Urinary incontinence      Family History  Problem Relation Age of Onset   Diabetes Mother    Hypertension Mother    Heart disease Mother    Cancer Father    Heart disease Father    Hypertension Sister    Cancer Sister    Diabetes Brother    Hypertension Brother    Cancer Maternal Uncle    Cancer Paternal Uncle    Breast cancer Neg Hx      Current Outpatient Medications:    albuterol (PROVENTIL HFA;VENTOLIN HFA) 108 (90 BASE) MCG/ACT inhaler, Inhale 2 puffs into the lungs every 6 (six) hours as needed for shortness of breath or wheezing., Disp: , Rfl:    BIOTIN PO, Take 1 tablet by mouth daily., Disp: , Rfl:    brimonidine (ALPHAGAN) 0.2 % ophthalmic solution, Place 1 drop into both eyes in the morning and at bedtime., Disp: , Rfl:    budesonide-formoterol (SYMBICORT) 160-4.5 MCG/ACT inhaler, Inhale 2 puffs into the lungs daily., Disp: , Rfl:    CALCIUM PO, Take 1 tablet by mouth daily., Disp: , Rfl:    diclofenac Sodium (VOLTAREN) 1 % GEL, Apply 2 g topically daily as needed for pain., Disp: , Rfl:    DULoxetine (CYMBALTA) 30 MG capsule,  Take 30 mg by mouth daily., Disp: , Rfl:    ELDERBERRY PO, Take 1 tablet by mouth daily., Disp: , Rfl:    fluticasone (FLONASE) 50 MCG/ACT nasal spray, Place 1 spray into both nostrils in the morning., Disp: , Rfl:    MAGNESIUM PO, Take 1 tablet by mouth daily., Disp: , Rfl:    Multiple Vitamin (MULTIVITAMIN WITH MINERALS) TABS tablet, Take 1 tablet by mouth 2 (two) times daily., Disp: , Rfl:    NURTEC 75 MG TBDP, Take 75 mg by mouth daily as needed (migraine)., Disp: 30 tablet, Rfl: 2   ondansetron (ZOFRAN ODT) 4 MG disintegrating tablet, Take 1 tablet (4 mg total) by mouth every 8 (eight) hours as needed for nausea or vomiting., Disp: 10 tablet, Rfl: 0   vitamin B-12 (CYANOCOBALAMIN) 100 MCG tablet, Take 100 mcg by mouth daily., Disp: , Rfl:    Oxycodone  HCl 10 MG TABS, SMARTSIG:1 Tablet(s) By Mouth 6 Times Daily PRN, Disp: , Rfl:    Semaglutide,0.25 or 0.5MG/DOS, (OZEMPIC, 0.25 OR 0.5 MG/DOSE,) 2 MG/1.5ML SOPN, Inject 0.5 mg into the skin once a week., Disp: 4.5 mL, Rfl: 1   Allergies  Allergen Reactions   Dilaudid [Hydromorphone Hcl] Hives and Itching   Iodinated Contrast Media Hives and Itching    Pt given 4 hour prep 06/21/20, c/o itching after injection- [facility]   Levaquin [Levofloxacin] Anaphylaxis   Tangerine Flavor Swelling    Tangerine fruit: Lip and tongue swelling with tangerines   Ace Inhibitors Swelling   Iodine-131 Itching and Rash     Review of Systems  Constitutional: Negative.   Respiratory: Negative.  Negative for shortness of breath.   Cardiovascular: Negative.  Negative for chest pain and palpitations.  Gastrointestinal: Negative.   Musculoskeletal:  Positive for arthralgias.  Skin:        Reports urticaria  Neurological: Negative.  Negative for headaches (she has tried rizatriptan, amovig).  Psychiatric/Behavioral: Negative.      Today's Vitals   03/26/21 1224  BP: 128/70  Pulse: 68  Temp: 98.5 F (36.9 C)  TempSrc: Oral  Weight: 285 lb 3.2 oz (129.4 kg)  Height: 5' 4.4" (1.636 m)   Body mass index is 48.35 kg/m.  Wt Readings from Last 3 Encounters:  03/26/21 285 lb 3.2 oz (129.4 kg)  12/06/20 295 lb (133.8 kg)  12/06/20 295 lb (133.8 kg)    Objective:  Physical Exam Vitals reviewed.  Constitutional:      General: She is not in acute distress.    Appearance: Normal appearance. She is obese.  Cardiovascular:     Rate and Rhythm: Normal rate and regular rhythm.     Pulses: Normal pulses.     Heart sounds: Normal heart sounds. No murmur heard. Pulmonary:     Effort: Pulmonary effort is normal. No respiratory distress.     Breath sounds: Normal breath sounds. No wheezing.  Skin:    Capillary Refill: Capillary refill takes less than 2 seconds.  Neurological:     General: No focal deficit present.      Mental Status: She is alert and oriented to person, place, and time.     Cranial Nerves: No cranial nerve deficit.     Motor: No weakness.  Psychiatric:        Mood and Affect: Mood normal.        Behavior: Behavior normal.        Thought Content: Thought content normal.        Judgment:  Judgment normal.        Assessment And Plan:     1. Hypertension, well controlled Comments: Blood pressure is well controlled, continue current medications  2. Controlled type 2 diabetes mellitus with complication, without long-term current use of insulin (HCC) Comments: Stable, continue current medications - Semaglutide,0.25 or 0.5MG/DOS, (OZEMPIC, 0.25 OR 0.5 MG/DOSE,) 2 MG/1.5ML SOPN; Inject 0.5 mg into the skin once a week.  Dispense: 4.5 mL; Refill: 1  3. Lupus ([place]) Comments: Will refer to local Rheumatology, she has not been seen by a provider in quite some time - Ambulatory referral to Rheumatology - CRP (C-Reactive Protein)  4. Fibromyalgia Comments: She is having pain all over.  - CRP (C-Reactive Protein)  5. Migraine without aura and without status migrainosus, not intractable Comments: She is not having relief with the Nurtec - Ambulatory referral to Neurology  6. Urticaria Comments: Will check for food allergies and metabolic causes.  - Autoimmune Profile - Iron, TIBC and Ferritin Panel - Allergens (48) - CBC - CMP14+EGFR  7. Decreased estrogen level - MM DIGITAL SCREENING BILATERAL; Future  8. Class 3 severe obesity due to excess calories with serious comorbidity and body mass index (BMI) of 45.0 to 49.9 in adult [facility]) She is encouraged to strive for BMI less than 30 to decrease cardiac risk. Advised to aim for at least 150 minutes of exercise per week.    Patient was given opportunity to ask questions. Patient verbalized understanding of the plan and was able to repeat key elements of the plan. All questions were answered to their satisfaction.  Minette Brine, FNP     I, Minette Brine, FNP, have reviewed all documentation for this visit. The documentation on 03/26/21 for the exam, diagnosis, procedures, and orders are all accurate and complete.   IF YOU HAVE BEEN REFERRED TO A SPECIALIST, IT MAY TAKE 1-2 WEEKS TO SCHEDULE/PROCESS THE REFERRAL. IF YOU HAVE NOT HEARD FROM US/SPECIALIST IN TWO WEEKS, PLEASE GIVE [place] A CALL AT [phone] X [phone].   THE PATIENT IS ENCOURAGED TO PRACTICE SOCIAL DISTANCING DUE TO THE COVID-19 PANDEMIC.

## 2021-03-26 NOTE — Patient Instructions (Signed)

## 2021-03-27 ENCOUNTER — Telehealth: Payer: Self-pay

## 2021-03-27 NOTE — Telephone Encounter (Signed)
Prior Auth for ozempic has been submitted we are just waiting on the determination. Marijo Sanes

## 2021-03-28 ENCOUNTER — Telehealth: Payer: Self-pay

## 2021-03-28 LAB — ALLERGENS (48)
Alternaria Alternata IgE: <0.1 kU/L
Amer Sycamore IgE Qn: <0.1 kU/L
Aureobasidi Pullulans IgE: <0.1 kU/L
Bahia Grass IgE: <0.1 kU/L
Beef IgE: <0.1 kU/L
Bermuda Grass IgE: <0.1 kU/L
Cat Dander IgE: <0.1 kU/L
Cedar, Mountain IgE: <0.1 kU/L
Cladosporium Herbarum IgE: <0.1 kU/L
Cocklebur IgE: <0.1 kU/L
Cockroach, German IgE: <0.1 kU/L
Codfish IgE: <0.1 kU/L
Common Silver Birch IgE: <0.1 kU/L
Cottonwood IgE: <0.1 kU/L
D Farinae IgE: <0.1 kU/L
D Pteronyssinus IgE: <0.1 kU/L
Dog Dander IgE: <0.1 kU/L
Egg, Whole IgE: <0.1 kU/L
Elm, American IgE: <0.1 kU/L
F020-IgE Almond: <0.1 kU/L
Hickory, White IgE: <0.1 kU/L
Johnson Grass IgE: <0.1 kU/L
Lamb's Quarters IgE: <0.1 kU/L
M009-IgE Fusarium proliferatum: <0.1 kU/L
M207-IgE Aspergillus niger: <0.1 kU/L
Maple/Box Elder IgE: <0.1 kU/L
Milk IgE: <0.1 kU/L
Mugwort IgE Qn: <0.1 kU/L
Oak, White IgE: <0.1 kU/L
Peanut IgE: <0.1 kU/L
Penicillium Chrysogen IgE: <0.1 kU/L
Pigweed, Rough IgE: <0.1 kU/L
Pine, White IgE: <0.1 kU/L
Plantain, English IgE: <0.1 kU/L
Setomelanomma Rostrat: <0.1 kU/L
Sheep Sorrel IgE Qn: <0.1 kU/L
Shrimp IgE: <0.1 kU/L
Soybean IgE: <0.1 kU/L
Stemphylium Herbarum IgE: <0.1 kU/L
Sweet gum IgE RAST Ql: <0.1 kU/L
T005-IgE Beech (American): <0.1 kU/L
T010-IgE Walnut: <0.1 kU/L
T012-IgE Willow: <0.1 kU/L
T015-IgE Ash, White: <0.1 kU/L
Timothy Grass IgE: <0.1 kU/L
W003-IgE Ragweed, Giant: <0.1 kU/L
Wheat IgE: <0.1 kU/L
White Mulberry IgE: <0.1 kU/L

## 2021-03-28 LAB — AUTOIMMUNE PROFILE
Anti Nuclear Antibody (ANA): NEGATIVE
Complement C3, Serum: 152 mg/dL (ref 82–167)
dsDNA Ab: 2 IU/mL (ref 0–9)

## 2021-03-28 LAB — CBC
Hematocrit: 40.9 % (ref 34.0–46.6)
Hemoglobin: 13.1 g/dL (ref 11.1–15.9)
MCH: 27.6 pg (ref 26.6–33.0)
MCHC: 32 g/dL (ref 31.5–35.7)
MCV: 86 fL (ref 79–97)
Platelets: 222 10*3/uL (ref 150–450)
RBC: 4.74 x10E6/uL (ref 3.77–5.28)
RDW: 13.9 % (ref 11.7–15.4)
WBC: 4.7 10*3/uL (ref 3.4–10.8)

## 2021-03-28 LAB — IRON,TIBC AND FERRITIN PANEL
Ferritin: 225 ng/mL — ABNORMAL HIGH (ref 15–150)
Iron Saturation: 23 % (ref 15–55)
Iron: 64 ug/dL (ref 27–159)
Total Iron Binding Capacity: 273 ug/dL (ref 250–450)
UIBC: 209 ug/dL (ref 131–425)

## 2021-03-28 LAB — CMP14+EGFR
ALT: 18 IU/L (ref 0–32)
AST: 20 IU/L (ref 0–40)
Albumin/Globulin Ratio: 1.5 (ref 1.2–2.2)
Albumin: 4 g/dL (ref 3.8–4.9)
Alkaline Phosphatase: 109 IU/L (ref 44–121)
BUN/Creatinine Ratio: 20 (ref 9–23)
BUN: 14 mg/dL (ref 6–24)
Bilirubin Total: 0.4 mg/dL (ref 0.0–1.2)
CO2: 26 mmol/L (ref 20–29)
Calcium: 9.2 mg/dL (ref 8.7–10.2)
Chloride: 102 mmol/L (ref 96–106)
Creatinine, Ser: 0.71 mg/dL (ref 0.57–1.00)
Globulin, Total: 2.7 g/dL (ref 1.5–4.5)
Glucose: 98 mg/dL (ref 70–99)
Potassium: 4.2 mmol/L (ref 3.5–5.2)
Sodium: 141 mmol/L (ref 134–144)
Total Protein: 6.7 g/dL (ref 6.0–8.5)
eGFR: 102 mL/min/{1.73_m2} (ref >59)

## 2021-03-28 LAB — C-REACTIVE PROTEIN: CRP: 7 mg/L (ref 0–10)

## 2021-03-28 NOTE — Telephone Encounter (Signed)
lvm  Call patient to have her to come in to sign for her records where she was diagnosed with lupus, she will need this done before she can see the local Rheumatologist.

## 2021-04-04 ENCOUNTER — Other Ambulatory Visit: Payer: 59

## 2021-04-20 ENCOUNTER — Ambulatory Visit
Admission: RE | Admit: 2021-04-20 | Discharge: 2021-04-20 | Disposition: A | Discharge status: Home / Self Care | Payer: 59 | Source: Ambulatory Visit | Attending: Physician Assistant | Admitting: Physician Assistant

## 2021-04-20 ENCOUNTER — Other Ambulatory Visit: Payer: Self-pay

## 2021-04-20 DIAGNOSIS — M4802 Spinal stenosis, cervical region: Secondary | ICD-10-CM | POA: Diagnosis not present

## 2021-04-20 DIAGNOSIS — M5412 Radiculopathy, cervical region: Secondary | ICD-10-CM

## 2021-05-29 ENCOUNTER — Encounter: Payer: Self-pay | Admitting: Neurology

## 2021-05-29 ENCOUNTER — Ambulatory Visit (INDEPENDENT_AMBULATORY_CARE_PROVIDER_SITE_OTHER): Payer: 59 | Admitting: Neurology

## 2021-05-29 ENCOUNTER — Telehealth: Payer: Self-pay | Admitting: Neurology

## 2021-05-29 VITALS — BP 122/82 | HR 75 | Ht 64.0 in | Wt 280.0 lb

## 2021-05-29 DIAGNOSIS — R519 Headache, unspecified: Secondary | ICD-10-CM

## 2021-05-29 DIAGNOSIS — R51 Headache with orthostatic component, not elsewhere classified: Secondary | ICD-10-CM | POA: Diagnosis not present

## 2021-05-29 DIAGNOSIS — G43709 Chronic migraine without aura, not intractable, without status migrainosus: Secondary | ICD-10-CM

## 2021-05-29 DIAGNOSIS — H5712 Ocular pain, left eye: Secondary | ICD-10-CM

## 2021-05-29 DIAGNOSIS — H539 Unspecified visual disturbance: Secondary | ICD-10-CM | POA: Diagnosis not present

## 2021-05-29 MED ORDER — AJOVY 225 MG/1.5ML ~~LOC~~ SOAJ: 225.0000 mg | SUBCUTANEOUS | 11 refills | Status: DC

## 2021-05-29 NOTE — Progress Notes (Signed)
[facility]    Provider:  Dr Jaynee Eagles Requesting Provider: Minette Brine, FNP Primary Care Provider:  Minette Brine, FNP  CC: Headaches and migraines  HPI:  Angelica Ortiz is a 56 y.o. female here as requested by Minette Brine, [place] for chronic migraines.  Past medical history diabetes, depression, fibromyalgia, asthma, lupus, asthma, morbid obesity, hypertension, migraines, B12 deficiency.  I have reviewed Dr. Tawanna Sat notes, patient has tried several different medications for her headaches and migraines, not responding to any including the Nurtec, I reviewed notes in epic I could not find any more significant information on her migraines or headaches, I also looked for imaging in epic and "care everywhere" no imaging of the brain, does not appear as though she seen anybody for migraines or headaches and "Care Everywhere".No aura. No medication overuse.   She has been suffering with migraines since a teenager, started worsening as a young adult, diagnosed 2008-2009. The last 6 months she can have severe migraines, they are on the left side, pulsating/pounding/throbbing/photophobia/phonophobia/nausea, eye pain like pressure behind the eye, shw wakes up with headaches and can be worse supine,no significant snoring, refreshed no excessive daytime somnolence, it can keep her in bed all day can last up to 24 hours, slamming of doors makes it worse, a dark room helps and a sleep mask, she has room darkening shades, at least 20 headache days out of the month, at least 15 of those are moderate to severe migraines. Nurtec works significantly. No hearing changes but she gets blurry vision, worsening and severe headaches. She has a lot of nausea with the headaches. No medication overuse. Smells can trigger a migraine. No other focal neurologic deficits, associated symptoms, inciting events or modifiable factors.   Reviewed notes, labs and imaging from outside physicians, which showed: 03/2021  cbc/cmp nml 10/2020: tsh nml  From a thorough review of records, medications tried that can be used in migraine management include: Amitriptyline, Lyrica, magnesium, Nurtec, Aimovig, rizatriptan, Imitrex.  Propranolol contraindicated due to asthma. She had severe constipation with the aimovig. Topamax.   Review of Systems: Patient complains of symptoms per HPI as well as the following symptoms obesity, chronic pain(in pain management). Pertinent negatives and positives per HPI. All others negative.   Social History   Socioeconomic History   Marital status: Divorced    Spouse name: Not on file   Number of children: Not on file   Years of education: Not on file   Highest education level: Not on file  Occupational History   Not on file  Tobacco Use   Smoking status: Never   Smokeless tobacco: Never  Vaping Use   Vaping Use: Never used  Substance and Sexual Activity   Alcohol use: No   Drug use: No   Sexual activity: Not Currently    Birth control/protection: None  Other Topics Concern   Not on file  Social History Narrative   Not on file   Social Determinants of Health   Financial Resource Strain: Low Risk    Difficulty of Paying Living Expenses: Not hard at all  Food Insecurity: No Food Insecurity   Worried About Charity fundraiser in the Last Year: Never true   [place] in the Last Year: Never true  Transportation Needs: No Transportation Needs   Lack of Transportation (Medical): No   Lack of Transportation (Non-Medical): No  Physical Activity: Inactive   Days of Exercise per Week: 0 days   Minutes of Exercise per  Session: 0 min  Stress: No Stress Concern Present   Feeling of Stress : Not at all  Social Connections: Not on file  Intimate Partner Violence: Not on file    Family History  Problem Relation Age of Onset   Diabetes Mother    Hypertension Mother    Heart disease Mother    Cancer Father    Heart disease Father    Hypertension Sister     Cancer Sister    Diabetes Brother    Hypertension Brother    Cancer Maternal Uncle    Cancer Paternal Uncle    Breast cancer Neg Hx     Past Medical History:  Diagnosis Date   Anemia    Arthritis    Asthma    Depression    Diabetes mellitus without complication ([place])    Type 2   Diverticulitis    Falls    Fibromyalgia    Gait instability    GERD (gastroesophageal reflux disease)    History of urinary tract infection    Hypertension    Lupus ([place])    no meds   Migraine    Morbid obesity 03/30/2013   Overactive bladder    Overactive bladder    Pulmonary embolism (HCC)    Resolved -At age 24 broken right leg, blood clot broke off - into lung   Seasonal allergies    Sleep apnea 06/05/2013   Does not have CPAP machine   Urinary incontinence     Patient Active Problem List   Diagnosis Date Noted   Migraine 01/04/2021   Esophageal dysphagia 04/26/2019   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   Asthma 11/12/2018   B12 deficiency 11/12/2018   Fibromyalgia 11/12/2018   History of migraine headaches 11/12/2018   Morbid obesity ([place]) 03/13/2015   Pelvic pain in female 09/05/2014   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/05/2014   Post-operative state 09/05/2014   Tubo-ovarian abscess 06/29/2014   Abnormal uterine bleeding (AUB) 03/14/2014   Pelvic abscess in female 03/30/2013   TOA (tubo-ovarian abscess) 03/30/2013   Morbid obesity 03/30/2013   Hypertension, well controlled    Lupus ([place])    Diverticulitis     Past Surgical History:  Procedure Laterality Date   ABDOMINAL HYSTERECTOMY     APPENDECTOMY     CESAREAN SECTION  04/08/1999   x 1   CHOLECYSTECTOMY     DILATION AND CURETTAGE OF UTERUS     x 3 for AUB   GASTRIC ROUX-EN-Y N/A 03/13/2015   Procedure: LAPAROSCOPIC ROUX-EN-Y GASTRIC BYPASS  ENTEROLYSIS OF ADHESIONS WITH UPPER ENDOSCOPY;  Surgeon: Alphonsa Overall, MD;  Location: [facility];  Service: General;  Laterality: N/A;   HYSTEROSCOPY  WITH NOVASURE N/A 03/14/2014   Procedure: HYSTEROSCOPY WITH NOVASURE;  Surgeon: Lavonia Drafts, MD;  Location: [facility];  Service: Gynecology;  Laterality: N/A;   LAMINECTOMY     L4-L5   LAPAROSCOPIC LYSIS OF ADHESIONS  03/13/2015   Procedure: LAPAROSCOPIC LYSIS OF ADHESIONS;  Surgeon: Alphonsa Overall, MD;  Location: [facility];  Service: General;;   LAPAROSCOPIC OOPHERECTOMY Right 09/05/2014   Procedure: abdominal OOPHERECTOMY;  Surgeon: Lavonia Drafts, MD;  Location: [facility];  Service: Gynecology;  Laterality: Right;   LAPAROSCOPIC OVARIAN CYSTECTOMY Right 04/07/1992   ORIF ANKLE FRACTURE Left    OVARIAN CYST REMOVAL Left 09/05/2014   Procedure: OVARIAN CYSTECTOMY and portion of left ovary removed ;  Surgeon: Lavonia Drafts, MD;  Location: [facility];  Service: Gynecology;  Laterality: Left;  ROTATOR CUFF REPAIR     right   ROTATOR CUFF REPAIR Right    SALPINGOOPHORECTOMY Bilateral 09/05/2014   Procedure: BILATERAL SALPINGECTOMY;  Surgeon: Lavonia Drafts, MD;  Location: [facility];  Service: Gynecology;  Laterality: Bilateral;   SHOULDER ARTHROSCOPY WITH ROTATOR CUFF REPAIR AND SUBACROMIAL DECOMPRESSION Left 11/01/2020   Procedure: SHOULDER ARTHROSCOPY WITH ROTATOR CUFF REPAIR AND SUBACROMIAL DECOMPRESSION, DISTAL CLAVICLE EXCISION;  Surgeon: Tania Ade, MD;  Location: [facility];  Service: Orthopedics;  Laterality: Left;   SUPRACERVICAL ABDOMINAL HYSTERECTOMY N/A 09/05/2014   Procedure: HYSTERECTOMY SUPRACERVICAL ABDOMINAL;  Surgeon: Lavonia Drafts, MD;  Location: [facility];  Service: Gynecology;  Laterality: N/A;   TONSILLECTOMY     UPPER GI ENDOSCOPY  03/13/2015   Procedure: UPPER GI ENDOSCOPY;  Surgeon: Alphonsa Overall, MD;  Location: [facility];  Service: General;;    Current Outpatient Medications  Medication Sig Dispense Refill   albuterol (PROVENTIL HFA;VENTOLIN HFA) 108 (90 BASE) MCG/ACT inhaler Inhale 2 puffs into the lungs every 6 (six) hours as needed for  shortness of breath or wheezing.     amitriptyline (ELAVIL) 25 MG tablet Take 25 mg by mouth at bedtime.     BIOTIN PO Take 1 tablet by mouth daily.     brimonidine (ALPHAGAN) 0.2 % ophthalmic solution 3 (three) times daily.     budesonide-formoterol (SYMBICORT) 160-4.5 MCG/ACT inhaler Inhale 2 puffs into the lungs daily.     CALCIUM PO Take 1 tablet by mouth daily.     diclofenac Sodium (VOLTAREN) 1 % GEL Apply 2 g topically daily as needed for pain.     ELDERBERRY PO Take 1 tablet by mouth daily.     fluticasone (FLONASE) 50 MCG/ACT nasal spray Place 1 spray into both nostrils in the morning.     Fremanezumab-vfrm (AJOVY) 225 MG/1.5ML SOAJ Inject 225 mg into the skin every 30 (thirty) days. 1.5 mL 11   MAGNESIUM PO Take 1 tablet by mouth daily.     Multiple Vitamin (MULTIVITAMIN WITH MINERALS) TABS tablet Take 1 tablet by mouth 2 (two) times daily.     NURTEC 75 MG TBDP Take 75 mg by mouth daily as needed (migraine). 30 tablet 2   Oxycodone HCl 10 MG TABS SMARTSIG:1 Tablet(s) By Mouth 6 Times Daily PRN     pregabalin (LYRICA) 50 MG capsule Take 50 mg by mouth 2 (two) times daily.     Semaglutide,0.25 or 0.5MG /DOS, (OZEMPIC, 0.25 OR 0.5 MG/DOSE,) 2 MG/1.5ML SOPN Inject 0.5 mg into the skin once a week. 4.5 mL 1   vitamin B-12 (CYANOCOBALAMIN) 100 MCG tablet Take 100 mcg by mouth daily.     No current facility-administered medications for this visit.    Allergies as of 05/29/2021 - Review Complete 05/29/2021  Allergen Reaction Noted   Dilaudid [hydromorphone hcl] Hives and Itching 03/17/2013   Iodinated contrast media Hives and Itching 10/23/2014   Levaquin [levofloxacin] Anaphylaxis 09/05/2014   Tangerine flavor Swelling 09/22/2013   Ace inhibitors Swelling 06/02/2014   Iodine-131 Itching and Rash 07/17/2016    Vitals: BP 122/82    Pulse 75    Ht 5\' 4"  (1.626 m)    Wt 280 lb (127 kg)    LMP  (LMP Unknown)    SpO2 95%    BMI 48.06 kg/m  Last Weight:  Wt Readings from Last 1  Encounters:  05/29/21 280 lb (127 kg)   Last Height:   Ht Readings from Last 1 Encounters:  05/29/21 5\' 4"  (1.626 m)  Physical exam: Exam: Gen: NAD, conversant, well nourised, obese, well groomed                     CV: RRR, no MRG. No Carotid Bruits. No peripheral edema, warm, nontender Eyes: Conjunctivae clear without exudates or hemorrhage  Neuro: Detailed Neurologic Exam  Speech:    Speech is normal; fluent and spontaneous with normal comprehension.  Cognition:    The patient is oriented to person, place, and time;     recent and remote memory intact;     language fluent;     normal attention, concentration,     fund of knowledge Cranial Nerves:    The pupils are equal, round, and reactive to light. Could not visualize fundi due to photophobia. Visual fields are full to finger confrontation. Extraocular movements are intact. Trigeminal sensation is intact and the muscles of mastication are normal. The face is symmetric. The palate elevates in the midline. Hearing intact. Voice is normal. Shoulder shrug is normal. The tongue has normal motion without fasciculations.   Coordination:    Normal   Gait:    Wide based likely due to large body habitus, slighty antalgic and slow  Motor Observation:    No asymmetry, no atrophy, and no involuntary movements noted. Tone:    Normal muscle tone.    Posture:    Posture is normal. normal erect    Strength: difficult motor exam, limited by pain, but no focal deficits noted In any limbs        Sensation: intact to LT     Reflex Exam:  DTR's:   AJs absent, trace patellars (may be due to body size) 1-2+ biceps.  Deep tendon reflexes in the upper and lower extremities are symmetrical bilaterally.   Toes:    The toes are equiv. bilaterally.   Clonus:    Clonus is absent.    Assessment/Plan:  Morbidly obese patient with chronic migraines without aura.   MRI brain: MRI brain due to concerning symptoms of morning headaches,  positional headaches,vision changes  to look for space occupying mass, chiari or intracranial hypertension (pseudotumor), demyelination(MS) or other  She has been to the eye doctor without any mention of papilledema  Nurtec works great acutely  [employer]  F/u 4 months  Discussed: To prevent or relieve headaches, try the following: Cool Compress. Lie down and place a cool compress on your head.  Avoid headache triggers. If certain foods or odors seem to have triggered your migraines in the past, avoid them. A headache diary might help you identify triggers.  Include physical activity in your daily routine. Try a daily walk or other moderate aerobic exercise.  Manage stress. Find healthy ways to cope with the stressors, such as delegating tasks on your to-do list.  Practice relaxation techniques. Try deep breathing, yoga, massage and visualization.  Eat regularly. Eating regularly scheduled meals and maintaining a healthy diet might help prevent headaches. Also, drink plenty of fluids.  Follow a regular sleep schedule. Sleep deprivation might contribute to headaches Consider biofeedback. With this mind-body technique, you learn to control certain bodily functions -- such as muscle tension, heart rate and blood pressure -- to prevent headaches or reduce headache pain.    Proceed to emergency room if you experience new or worsening symptoms or symptoms do not resolve, if you have new neurologic symptoms or if headache is severe, or for any concerning symptom.   Provided education and documentation from American headache Society  toolbox including articles on: chronic migraine medication overuse headache, chronic migraines, prevention of migraines, behavioral and other nonpharmacologic treatments for headache.    Orders Placed This Encounter  Procedures   MR BRAIN W WO CONTRAST   Meds ordered this encounter  Medications   Fremanezumab-vfrm (AJOVY) 225 MG/1.5ML SOAJ    Sig: Inject 225 mg  into the skin every 30 (thirty) days.    Dispense:  1.5 mL    Refill:  11    Cc: Minette Brine, [place],  Minette Brine, FNP  [facility]ina Ill, [facility] [address] [place], [place] [address]  Phone [phone] Fax [phone]

## 2021-05-29 NOTE — Patient Instructions (Signed)
MRI of the brain Start ajovy  Fremanezumab injection What is this medication? FREMANEZUMAB (fre ma NEZ ue mab) is used to prevent migraine headaches. This medicine may be used for other purposes; ask your health care provider or pharmacist if you have questions. COMMON BRAND NAME(S): AJOVY What should I tell my care team before I take this medication? They need to know if you have any of these conditions: an unusual or allergic reaction to fremanezumab, other medicines, foods, dyes, or preservatives pregnant or trying to get pregnant breast-feeding How should I use this medication? This medicine is for injection under the skin. You will be taught how to prepare and give this medicine. Use exactly as directed. Take your medicine at regular intervals. Do not take your medicine more often than directed. It is important that you put your used needles and syringes in a special sharps container. Do not put them in a trash can. If you do not have a sharps container, call your pharmacist or healthcare provider to get one. Talk to your pediatrician regarding the use of this medicine in children. Special care may be needed. Overdosage: If you think you have taken too much of this medicine contact a poison control center or emergency room at once. NOTE: This medicine is only for you. Do not share this medicine with others. What if I miss a dose? If you miss a dose, take it as soon as you can. If it is almost time for your next dose, take only that dose. Do not take double or extra doses. What may interact with this medication? Interactions are not expected. This list may not describe all possible interactions. Give your health care provider a list of all the medicines, herbs, non-prescription drugs, or dietary supplements you use. Also tell them if you smoke, drink alcohol, or use illegal drugs. Some items may interact with your medicine. What should I watch for while using this medication? Tell your  doctor or healthcare professional if your symptoms do not start to get better or if they get worse. What side effects may I notice from receiving this medication? Side effects that you should report to your doctor or health care professional as soon as possible: allergic reactions like skin rash, itching or hives, swelling of the face, lips, or tongue Side effects that usually do not require medical attention (report these to your doctor or health care professional if they continue or are bothersome): pain, redness, or irritation at site where injected This list may not describe all possible side effects. Call your doctor for medical advice about side effects. You may report side effects to FDA at 1-800-FDA-1088. Where should I keep my medication? Keep out of the reach of children. You will be instructed on how to store this medicine. Throw away any unused medicine after the expiration date on the label. NOTE: This sheet is a summary. It may not cover all possible information. If you have questions about this medicine, talk to your doctor, pharmacist, or health care provider.  2022 Elsevier/Gold Standard (2016-12-23 00:00:00)

## 2021-05-29 NOTE — Telephone Encounter (Signed)
[employer] medicare order sent to [facility], [facility] they will reach out to the patient to schedule.  

## 2021-05-30 ENCOUNTER — Telehealth: Payer: Self-pay | Admitting: *Deleted

## 2021-05-30 DIAGNOSIS — M65341 Trigger finger, right ring finger: Secondary | ICD-10-CM | POA: Diagnosis not present

## 2021-05-30 NOTE — Telephone Encounter (Signed)
Completed Ajovy PA on Cover My Meds. Key: [id number]. Awaiting determination from [facility].

## 2021-06-13 NOTE — Telephone Encounter (Signed)
Its in the que.  ?

## 2021-06-13 NOTE — Telephone Encounter (Signed)
[facility] [place]) PA was denied. Pt requesting PA be run through [employer] for [facility] (AJOVY) 225 MG/1.5ML SOAJ ? ?Reference Key: [id number] ?

## 2021-06-17 ENCOUNTER — Telehealth: Payer: Self-pay | Admitting: *Deleted

## 2021-06-17 NOTE — Telephone Encounter (Signed)
[facility] - PA Case ID: [id number] ? ?PA Ajovy Completed waiting on approval  ?

## 2021-06-20 DIAGNOSIS — Z79891 Long term (current) use of opiate analgesic: Secondary | ICD-10-CM | POA: Diagnosis not present

## 2021-06-20 DIAGNOSIS — M47812 Spondylosis without myelopathy or radiculopathy, cervical region: Secondary | ICD-10-CM | POA: Diagnosis not present

## 2021-06-20 DIAGNOSIS — G894 Chronic pain syndrome: Secondary | ICD-10-CM | POA: Diagnosis not present

## 2021-06-20 DIAGNOSIS — M25511 Pain in right shoulder: Secondary | ICD-10-CM | POA: Diagnosis not present

## 2021-07-04 DIAGNOSIS — M65341 Trigger finger, right ring finger: Secondary | ICD-10-CM | POA: Diagnosis not present

## 2021-07-08 DIAGNOSIS — M5412 Radiculopathy, cervical region: Secondary | ICD-10-CM | POA: Diagnosis not present

## 2021-07-11 DIAGNOSIS — M25511 Pain in right shoulder: Secondary | ICD-10-CM | POA: Diagnosis not present

## 2021-07-11 DIAGNOSIS — G894 Chronic pain syndrome: Secondary | ICD-10-CM | POA: Diagnosis not present

## 2021-07-11 DIAGNOSIS — Z79891 Long term (current) use of opiate analgesic: Secondary | ICD-10-CM | POA: Diagnosis not present

## 2021-07-11 DIAGNOSIS — M4722 Other spondylosis with radiculopathy, cervical region: Secondary | ICD-10-CM | POA: Diagnosis not present

## 2021-07-18 ENCOUNTER — Encounter (INDEPENDENT_AMBULATORY_CARE_PROVIDER_SITE_OTHER): Payer: Self-pay | Admitting: Primary Care

## 2021-07-18 ENCOUNTER — Ambulatory Visit (INDEPENDENT_AMBULATORY_CARE_PROVIDER_SITE_OTHER): Payer: 59 | Admitting: Primary Care

## 2021-07-18 VITALS — BP 121/84 | HR 76 | Temp 97.7°F | Ht 64.0 in | Wt 281.6 lb

## 2021-07-18 DIAGNOSIS — E538 Deficiency of other specified B group vitamins: Secondary | ICD-10-CM | POA: Diagnosis not present

## 2021-07-18 DIAGNOSIS — G894 Chronic pain syndrome: Secondary | ICD-10-CM

## 2021-07-18 DIAGNOSIS — J4520 Mild intermittent asthma, uncomplicated: Secondary | ICD-10-CM | POA: Diagnosis not present

## 2021-07-18 DIAGNOSIS — I10 Essential (primary) hypertension: Secondary | ICD-10-CM

## 2021-07-18 DIAGNOSIS — E118 Type 2 diabetes mellitus with unspecified complications: Secondary | ICD-10-CM

## 2021-07-18 DIAGNOSIS — R7303 Prediabetes: Secondary | ICD-10-CM

## 2021-07-18 DIAGNOSIS — Z7689 Persons encountering health services in other specified circumstances: Secondary | ICD-10-CM

## 2021-07-18 MED ORDER — FLUTICASONE PROPIONATE 50 MCG/ACT NA SUSP: 1.0000 | Freq: Every morning | NASAL | 3 refills | Status: DC

## 2021-07-18 MED ORDER — BUDESONIDE-FORMOTEROL FUMARATE 160-4.5 MCG/ACT IN AERO: 2.0000 | INHALATION_SPRAY | Freq: Every day | RESPIRATORY_TRACT | 6 refills | Status: DC

## 2021-07-18 MED ORDER — OZEMPIC (0.25 OR 0.5 MG/DOSE) 2 MG/1.5ML ~~LOC~~ SOPN: 0.5000 mg | PEN_INJECTOR | SUBCUTANEOUS | 1 refills | Status: DC

## 2021-07-18 MED ORDER — ALBUTEROL SULFATE HFA 108 (90 BASE) MCG/ACT IN AERS: 2.0000 | INHALATION_SPRAY | Freq: Four times a day (QID) | RESPIRATORY_TRACT | 2 refills | Status: DC | PRN

## 2021-07-18 NOTE — Progress Notes (Signed)
? ?New Patient Office Visit ? ?Subjective:  ?Patient ID: Angelica Ortiz, female    DOB: 1965-07-09  Age: 56 y.o. MRN: [id number] ? ?CC:  ?Chief Complaint  ?Patient presents with  ? New Patient (Initial Visit)  ? ? ?HPI ?Angelica Ortiz presents for establishment of care. Prior patient at another office.  ? ?Past Medical History:  ?Diagnosis Date  ? Anemia   ? Arthritis   ? Asthma   ? Depression   ? Diabetes mellitus without complication (HCC)   ? Type 2  ? Diverticulitis   ? Falls   ? Fibromyalgia   ? Gait instability   ? GERD (gastroesophageal reflux disease)   ? History of urinary tract infection   ? Hypertension   ? Lupus (HCC)   ? no meds  ? Migraine   ? Morbid obesity 03/30/2013  ? Overactive bladder   ? Overactive bladder   ? Pulmonary embolism (HCC)   ? Resolved -At age 66 broken right leg, blood clot broke off - into lung  ? Seasonal allergies   ? Sleep apnea 06/05/2013  ? Does not have CPAP machine  ? Urinary incontinence   ? ? ?Past Surgical History:  ?Procedure Laterality Date  ? ABDOMINAL HYSTERECTOMY    ? APPENDECTOMY    ? CESAREAN SECTION  04/08/1999  ? x 1  ? CHOLECYSTECTOMY    ? DILATION AND CURETTAGE OF UTERUS    ? x 3 for AUB  ? GASTRIC ROUX-EN-Y N/A 03/13/2015  ? Procedure: LAPAROSCOPIC ROUX-EN-Y GASTRIC BYPASS  ENTEROLYSIS OF ADHESIONS WITH UPPER ENDOSCOPY;  Surgeon: Ovidio Kin, MD;  Location: [facility];  Service: General;  Laterality: N/A;  ? HYSTEROSCOPY WITH NOVASURE N/A 03/14/2014  ? Procedure: HYSTEROSCOPY WITH NOVASURE;  Surgeon: Willodean Rosenthal, MD;  Location: [facility];  Service: Gynecology;  Laterality: N/A;  ? LAMINECTOMY    ? L4-L5  ? LAPAROSCOPIC LYSIS OF ADHESIONS  03/13/2015  ? Procedure: LAPAROSCOPIC LYSIS OF ADHESIONS;  Surgeon: Ovidio Kin, MD;  Location: [facility];  Service: General;;  ? LAPAROSCOPIC OOPHERECTOMY Right 09/05/2014  ? Procedure: abdominal OOPHERECTOMY;  Surgeon: Willodean Rosenthal, MD;  Location: [facility];  Service: Gynecology;  Laterality: Right;  ? LAPAROSCOPIC  OVARIAN CYSTECTOMY Right 04/07/1992  ? ORIF ANKLE FRACTURE Left   ? OVARIAN CYST REMOVAL Left 09/05/2014  ? Procedure: OVARIAN CYSTECTOMY and portion of left ovary removed ;  Surgeon: Willodean Rosenthal, MD;  Location: [facility];  Service: Gynecology;  Laterality: Left;  ? ROTATOR CUFF REPAIR    ? right  ? ROTATOR CUFF REPAIR Right   ? SALPINGOOPHORECTOMY Bilateral 09/05/2014  ? Procedure: BILATERAL SALPINGECTOMY;  Surgeon: Willodean Rosenthal, MD;  Location: [facility];  Service: Gynecology;  Laterality: Bilateral;  ? SHOULDER ARTHROSCOPY WITH ROTATOR CUFF REPAIR AND SUBACROMIAL DECOMPRESSION Left 11/01/2020  ? Procedure: SHOULDER ARTHROSCOPY WITH ROTATOR CUFF REPAIR AND SUBACROMIAL DECOMPRESSION, DISTAL CLAVICLE EXCISION;  Surgeon: Jones Broom, MD;  Location: [facility];  Service: Orthopedics;  Laterality: Left;  ? SUPRACERVICAL ABDOMINAL HYSTERECTOMY N/A 09/05/2014  ? Procedure: HYSTERECTOMY SUPRACERVICAL ABDOMINAL;  Surgeon: Willodean Rosenthal, MD;  Location: [facility];  Service: Gynecology;  Laterality: N/A;  ? TONSILLECTOMY    ? UPPER GI ENDOSCOPY  03/13/2015  ? Procedure: UPPER GI ENDOSCOPY;  Surgeon: Ovidio Kin, MD;  Location: [facility];  Service: General;;  ? ? ?Family History  ?Problem Relation Age of Onset  ? Diabetes Mother   ? Hypertension Mother   ? Heart disease Mother   ? Cancer Father   ?  Heart disease Father   ? Hypertension Sister   ? Cancer Sister   ? Diabetes Brother   ? Hypertension Brother   ? Cancer Maternal Uncle   ? Cancer Paternal Uncle   ? Breast cancer Neg Hx   ? ? ?Social History  ? ?Socioeconomic History  ? Marital status: Divorced  ?  Spouse name: Not on file  ? Number of children: Not on file  ? Years of education: Not on file  ? Highest education level: Not on file  ?Occupational History  ? Not on file  ?Tobacco Use  ? Smoking status: Never  ? Smokeless tobacco: Never  ?Vaping Use  ? Vaping Use: Never used  ?Substance and Sexual Activity  ? Alcohol use: No  ? Drug use: No  ? Sexual  activity: Not Currently  ?  Birth control/protection: None  ?Other Topics Concern  ? Not on file  ?Social History Narrative  ? Not on file  ? ?Social Determinants of Health  ? ?Financial Resource Strain: Low Risk   ? Difficulty of Paying Living Expenses: Not hard at all  ?Food Insecurity: No Food Insecurity  ? Worried About Programme researcher, broadcasting/film/video in the Last Year: Never true  ? Ran Out of Food in the Last Year: Never true  ?Transportation Needs: No Transportation Needs  ? Lack of Transportation (Medical): No  ? Lack of Transportation (Non-Medical): No  ?Physical Activity: Inactive  ? Days of Exercise per Week: 0 days  ? Minutes of Exercise per Session: 0 min  ?Stress: No Stress Concern Present  ? Feeling of Stress : Not at all  ?Social Connections: Not on file  ?Intimate Partner Violence: Not on file  ? ? ?ROS ?Comprehensive ROS Pertinent positive and negative noted in HPI   ?Objective:  ? ?Today's Vitals: BP 121/84 (BP Location: Right Arm, Patient Position: Sitting, Cuff Size: Large)   Pulse 76   Temp 97.7 ?F (36.5 ?C) (Oral)   Ht 5\' 4"  (1.626 m)   Wt 281 lb 9.6 oz (127.7 kg)   LMP  (LMP Unknown)   SpO2 98%   BMI 48.34 kg/m?  ? ?Physical Exam ?Vitals reviewed.  ?Constitutional:   ?   Appearance: She is obese.  ?HENT:  ?   Head: Normocephalic.  ?   Right Ear: Tympanic membrane normal.  ?   Left Ear: Tympanic membrane normal.  ?   Nose: Nose normal.  ?Cardiovascular:  ?   Rate and Rhythm: Normal rate and regular rhythm.  ?Pulmonary:  ?   Effort: Pulmonary effort is normal.  ?   Breath sounds: Normal breath sounds.  ?Abdominal:  ?   General: Abdomen is flat. Bowel sounds are normal. There is distension.  ?Musculoskeletal:  ?   Cervical back: Normal range of motion and neck supple.  ?Skin: ?   General: Skin is warm and dry.  ?Neurological:  ?   Mental Status: She is alert and oriented to person, place, and time.  ?Psychiatric:     ?   Mood and Affect: Mood normal.     ?   Behavior: Behavior normal.     ?   Thought  Content: Thought content normal.     ?   Judgment: Judgment normal.  ? ?Assessment & Plan:  ?Niamh was seen today for new patient (initial visit). ? ?Diagnoses and all orders for this visit: ? ?Encounter to establish care ?Establish care ? ?Morbid obesity (HCC) ?-     Semaglutide,0.25 or  0.5MG /DOS, (OZEMPIC, 0.25 OR 0.5 MG/DOSE,) 2 MG/1.5ML SOPN; Inject 0.5 mg into the skin once a week. ? ?Hypertension, well controlled ?Without medication dietary modifications. ? ?B12 deficiency ?Gastric bypass ? ?Chronic pain syndrome ?Followed by pain management  ? ?Controlled type 2 diabetes mellitus with complication, without long-term current use of insulin (HCC) ?Comments: ?Stable, continue current medications ?Orders: ?-     Semaglutide,0.25 or 0.5MG /DOS, (OZEMPIC, 0.25 OR 0.5 MG/DOSE,) 2 MG/1.5ML SOPN; Inject 0.5 mg into the skin once a week. ? ?Mild intermittent asthma, unspecified whether complicated ?-     albuterol (VENTOLIN HFA) 108 (90 Base) MCG/ACT inhaler; Inhale 2 puffs into the lungs every 6 (six) hours as needed for shortness of breath or wheezing. ?-     budesonide-formoterol (SYMBICORT) 160-4.5 MCG/ACT inhaler; Inhale 2 puffs into the lungs daily. ?-     fluticasone (FLONASE) 50 MCG/ACT nasal spray; Place 1 spray into both nostrils in the morning. ? ?Prediabetes ?-     HgB A1c ?-     Semaglutide,0.25 or 0.5MG /DOS, (OZEMPIC, 0.25 OR 0.5 MG/DOSE,) 2 MG/1.5ML SOPN; Inject 0.5 mg into the skin once a week. ? ?Outpatient Encounter Medications as of 07/18/2021  ?Medication Sig  ? amitriptyline (ELAVIL) 25 MG tablet Take 25 mg by mouth at bedtime.  ? BIOTIN PO Take 1 tablet by mouth daily.  ? brimonidine (ALPHAGAN) 0.2 % ophthalmic solution 3 (three) times daily.  ? CALCIUM PO Take 1 tablet by mouth daily.  ? diclofenac Sodium (VOLTAREN) 1 % GEL Apply 2 g topically daily as needed for pain.  ? ELDERBERRY PO Take 1 tablet by mouth daily.  ? Fremanezumab-vfrm (AJOVY) 225 MG/1.5ML SOAJ Inject 225 mg into the skin every 30  (thirty) days.  ? MAGNESIUM PO Take 1 tablet by mouth daily.  ? Multiple Vitamin (MULTIVITAMIN WITH MINERALS) TABS tablet Take 1 tablet by mouth 2 (two) times daily.  ? NURTEC 75 MG TBDP Take 75 mg by mouth daily a

## 2021-07-19 ENCOUNTER — Telehealth: Payer: Self-pay

## 2021-07-19 LAB — HEMOGLOBIN A1C
Est. average glucose Bld gHb Est-mCnc: 111 mg/dL
Hgb A1c MFr Bld: 5.5 % (ref 4.8–5.6)

## 2021-07-19 NOTE — Telephone Encounter (Signed)
Patient transferred care to [facility].  ?

## 2021-07-25 ENCOUNTER — Encounter: Payer: Medicare Other | Admitting: Nurse Practitioner

## 2021-08-08 DIAGNOSIS — G894 Chronic pain syndrome: Secondary | ICD-10-CM | POA: Diagnosis not present

## 2021-08-22 ENCOUNTER — Other Ambulatory Visit: Payer: Self-pay | Admitting: Primary Care

## 2021-08-22 DIAGNOSIS — Z1231 Encounter for screening mammogram for malignant neoplasm of breast: Secondary | ICD-10-CM

## 2021-08-27 DIAGNOSIS — M25512 Pain in left shoulder: Secondary | ICD-10-CM | POA: Diagnosis not present

## 2021-08-29 ENCOUNTER — Ambulatory Visit
Admission: RE | Admit: 2021-08-29 | Discharge: 2021-08-29 | Disposition: A | Discharge status: Home / Self Care | Payer: 59 | Source: Ambulatory Visit | Attending: Primary Care | Admitting: Primary Care

## 2021-08-29 DIAGNOSIS — Z1231 Encounter for screening mammogram for malignant neoplasm of breast: Secondary | ICD-10-CM

## 2021-09-26 ENCOUNTER — Other Ambulatory Visit (HOSPITAL_COMMUNITY): Payer: Self-pay

## 2021-09-26 DIAGNOSIS — M25512 Pain in left shoulder: Secondary | ICD-10-CM | POA: Diagnosis not present

## 2021-09-26 DIAGNOSIS — M4722 Other spondylosis with radiculopathy, cervical region: Secondary | ICD-10-CM | POA: Diagnosis not present

## 2021-09-26 MED ORDER — OXYCODONE-ACETAMINOPHEN 10-325 MG PO TABS
ORAL_TABLET | ORAL | 0 refills | Status: DC
  Filled 2021-09-26: qty 162, 30d supply, fill #0

## 2021-09-26 MED ORDER — PREGABALIN 50 MG PO CAPS
ORAL_CAPSULE | ORAL | 0 refills | Status: DC
  Filled 2021-09-26: qty 60, 30d supply, fill #0

## 2021-10-03 ENCOUNTER — Encounter: Payer: Self-pay | Admitting: Neurology

## 2021-10-03 ENCOUNTER — Telehealth (INDEPENDENT_AMBULATORY_CARE_PROVIDER_SITE_OTHER): Payer: 59 | Admitting: Neurology

## 2021-10-03 ENCOUNTER — Telehealth: Payer: Self-pay | Admitting: Neurology

## 2021-10-03 DIAGNOSIS — G43809 Other migraine, not intractable, without status migrainosus: Secondary | ICD-10-CM | POA: Diagnosis not present

## 2021-10-03 DIAGNOSIS — G43709 Chronic migraine without aura, not intractable, without status migrainosus: Secondary | ICD-10-CM

## 2021-10-03 MED ORDER — AJOVY 225 MG/1.5ML ~~LOC~~ SOAJ: 225.0000 mg | SUBCUTANEOUS | 4 refills | Status: DC

## 2021-10-03 MED ORDER — NURTEC 75 MG PO TBDP: 75.0000 mg | ORAL_TABLET | Freq: Every day | ORAL | 4 refills | Status: DC | PRN

## 2021-10-03 NOTE — Telephone Encounter (Signed)
Scheduled for 10/08/22 at 1:15 pm with Margie Ege.

## 2021-10-03 NOTE — Patient Instructions (Signed)
Continue Emgality and nurtec

## 2021-10-03 NOTE — Telephone Encounter (Signed)
Jillian, can you schedule patient for 1 year follow up with NP please? thanks

## 2021-10-03 NOTE — Progress Notes (Signed)
[facility]    Provider:  Dr Lucia Gaskins Requesting Provider: Arnette Felts, FNP Primary Care Provider:  Grayce Sessions, NP  CC: Headaches and migraines  Virtual Visit via Video Note  I connected with Angelica Ortiz on 10/03/21 at  1:00 PM EDT by a video enabled telemedicine application and verified that I am speaking with the correct person using two identifiers.  Location: Patient: home Provider: office   I discussed the limitations of evaluation and management by telemedicine and the availability of in person appointments. The patient expressed understanding and agreed to proceed.  Follow Up Instructions:    I discussed the assessment and treatment plan with the patient. The patient was provided an opportunity to ask questions and all were answered. The patient agreed with the plan and demonstrated an understanding of the instructions.   The patient was advised to call back or seek an in-person evaluation if the symptoms worsen or if the condition fails to improve as anticipated.  I provided 20 minutes of non-face-to-face time during this encounter.   Anson Fret, MD   10/03/2021: Angelica Ortiz is  life changing.  Before ajovy, baseline was at least 20 headache days out of the month, at least 15 of those are moderate to severe migraines. Now she has reduces to 4 migraines a month and no headaches. It is working extremely well. The few times she did have one, she took one nurtec and it terminated the headache. It has made a huge difference. 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 Pertinent negatives and positives per HPI. All others negative   HPI:  Angelica Ortiz is a 56 y.o. female here as requested by Arnette Felts, FNP for chronic migraines.  Past medical history diabetes,  depression, fibromyalgia, asthma, lupus, asthma, morbid obesity, hypertension, migraines, B12 deficiency.  I have reviewed Dr. Kathi Der notes, patient has tried several different medications for her headaches and migraines, not responding to any including the Nurtec, I reviewed notes in epic I could not find any more significant information on her migraines or headaches, I also looked for imaging in epic and "care everywhere" no imaging of the brain, does not appear as though she seen anybody for migraines or headaches and "Care Everywhere".No aura. No medication overuse.   She has been suffering with migraines since a teenager, started worsening as a young adult, diagnosed 2008-2009. The last 6 months she can have severe migraines, they are on the left side, pulsating/pounding/throbbing/photophobia/phonophobia/nausea, eye pain like pressure behind the eye, shw wakes up with headaches and can be worse supine,no significant snoring, refreshed no excessive daytime somnolence, it can keep her in bed all day can last up to 24 hours, slamming of doors makes it worse, a dark room helps and a sleep mask, she has room darkening shades, at least 20 headache days out of the month, at least 15 of those are moderate to severe migraines. Nurtec works significantly. No hearing changes but she gets blurry vision, worsening and severe headaches. She has a lot of nausea with the headaches. No medication overuse. Smells can trigger a migraine. No other focal neurologic deficits, associated symptoms, inciting events or modifiable factors.   Reviewed notes, labs and imaging from outside physicians, which showed: 03/2021 cbc/cmp nml 10/2020: tsh nml  From a  thorough review of records, medications tried that can be used in migraine management include: Amitriptyline, Lyrica, magnesium, Nurtec, Aimovig, rizatriptan, Imitrex.  Propranolol contraindicated due to asthma. She had severe constipation with the aimovig. Topamax.   Review  of Systems: Patient complains of symptoms per HPI as well as the following symptoms obesity, chronic pain(in pain management). Pertinent negatives and positives per HPI. All others negative.   Social History   Socioeconomic History   Marital status: Divorced    Spouse name: Not on file   Number of children: Not on file   Years of education: Not on file   Highest education level: Not on file  Occupational History   Not on file  Tobacco Use   Smoking status: Never   Smokeless tobacco: Never  Vaping Use   Vaping Use: Never used  Substance and Sexual Activity   Alcohol use: No   Drug use: No   Sexual activity: Not Currently    Birth control/protection: None  Other Topics Concern   Not on file  Social History Narrative   Not on file   Social Determinants of Health   Financial Resource Strain: Low Risk  (12/06/2020)   Overall Financial Resource Strain (CARDIA)    Difficulty of Paying Living Expenses: Not hard at all  Food Insecurity: No Food Insecurity (12/06/2020)   Hunger Vital Sign    Worried About Running Out of Food in the Last Year: Never true    Capac in the Last Year: Never true  Transportation Needs: No Transportation Needs (12/06/2020)   PRAPARE - Hydrologist (Medical): No    Lack of Transportation (Non-Medical): No  Physical Activity: Inactive (12/06/2020)   Exercise Vital Sign    Days of Exercise per Week: 0 days    Minutes of Exercise per Session: 0 min  Stress: No Stress Concern Present (12/06/2020)   [place]    Feeling of Stress : Not at all  Social Connections: Not on file  Intimate Partner Violence: Not on file    Family History  Problem Relation Age of Onset   Diabetes Mother    Hypertension Mother    Heart disease Mother    Cancer Father    Heart disease Father    Hypertension Sister    Cancer Sister    Diabetes Brother    Hypertension Brother     Cancer Maternal Uncle    Cancer Paternal Uncle    Breast cancer Neg Hx     Past Medical History:  Diagnosis Date   Anemia    Arthritis    Asthma    Depression    Diabetes mellitus without complication ([place])    Type 2   Diverticulitis    Falls    Fibromyalgia    Gait instability    GERD (gastroesophageal reflux disease)    History of urinary tract infection    Hypertension    Lupus ([place])    no meds   Migraine    Morbid obesity 03/30/2013   Overactive bladder    Overactive bladder    Pulmonary embolism (HCC)    Resolved -At age 83 broken right leg, blood clot broke off - into lung   Seasonal allergies    Sleep apnea 06/05/2013   Does not have CPAP machine   Urinary incontinence     Patient Active Problem List   Diagnosis Date Noted   Migraine 01/04/2021  Esophageal dysphagia 04/26/2019   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   Asthma 11/12/2018   B12 deficiency 11/12/2018   Fibromyalgia 11/12/2018   History of migraine headaches 11/12/2018   Morbid obesity (HCC) 03/13/2015   Pelvic pain in female 09/05/2014   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/05/2014   Post-operative state 09/05/2014   Tubo-ovarian abscess 06/29/2014   Abnormal uterine bleeding (AUB) 03/14/2014   Pelvic abscess in female 03/30/2013   TOA (tubo-ovarian abscess) 03/30/2013   Morbid obesity 03/30/2013   Hypertension, well controlled    Lupus (HCC)    Diverticulitis     Past Surgical History:  Procedure Laterality Date   ABDOMINAL HYSTERECTOMY     APPENDECTOMY     CESAREAN SECTION  04/08/1999   x 1   CHOLECYSTECTOMY     DILATION AND CURETTAGE OF UTERUS     x 3 for AUB   GASTRIC ROUX-EN-Y N/A 03/13/2015   Procedure: LAPAROSCOPIC ROUX-EN-Y GASTRIC BYPASS  ENTEROLYSIS OF ADHESIONS WITH UPPER ENDOSCOPY;  Surgeon: Ovidio Kin, MD;  Location: [facility];  Service: General;  Laterality: N/A;   HYSTEROSCOPY WITH NOVASURE N/A 03/14/2014   Procedure: HYSTEROSCOPY WITH  NOVASURE;  Surgeon: Willodean Rosenthal, MD;  Location: [facility];  Service: Gynecology;  Laterality: N/A;   LAMINECTOMY     L4-L5   LAPAROSCOPIC LYSIS OF ADHESIONS  03/13/2015   Procedure: LAPAROSCOPIC LYSIS OF ADHESIONS;  Surgeon: Ovidio Kin, MD;  Location: [facility];  Service: General;;   LAPAROSCOPIC OOPHERECTOMY Right 09/05/2014   Procedure: abdominal OOPHERECTOMY;  Surgeon: Willodean Rosenthal, MD;  Location: [facility];  Service: Gynecology;  Laterality: Right;   LAPAROSCOPIC OVARIAN CYSTECTOMY Right 04/07/1992   ORIF ANKLE FRACTURE Left    OVARIAN CYST REMOVAL Left 09/05/2014   Procedure: OVARIAN CYSTECTOMY and portion of left ovary removed ;  Surgeon: Willodean Rosenthal, MD;  Location: [facility];  Service: Gynecology;  Laterality: Left;   ROTATOR CUFF REPAIR     right   ROTATOR CUFF REPAIR Right    SALPINGOOPHORECTOMY Bilateral 09/05/2014   Procedure: BILATERAL SALPINGECTOMY;  Surgeon: Willodean Rosenthal, MD;  Location: [facility];  Service: Gynecology;  Laterality: Bilateral;   SHOULDER ARTHROSCOPY WITH ROTATOR CUFF REPAIR AND SUBACROMIAL DECOMPRESSION Left 11/01/2020   Procedure: SHOULDER ARTHROSCOPY WITH ROTATOR CUFF REPAIR AND SUBACROMIAL DECOMPRESSION, DISTAL CLAVICLE EXCISION;  Surgeon: Jones Broom, MD;  Location: [facility];  Service: Orthopedics;  Laterality: Left;   SUPRACERVICAL ABDOMINAL HYSTERECTOMY N/A 09/05/2014   Procedure: HYSTERECTOMY SUPRACERVICAL ABDOMINAL;  Surgeon: Willodean Rosenthal, MD;  Location: [facility];  Service: Gynecology;  Laterality: N/A;   TONSILLECTOMY     UPPER GI ENDOSCOPY  03/13/2015   Procedure: UPPER GI ENDOSCOPY;  Surgeon: Ovidio Kin, MD;  Location: [facility];  Service: General;;    Current Outpatient Medications  Medication Sig Dispense Refill   albuterol (VENTOLIN HFA) 108 (90 Base) MCG/ACT inhaler Inhale 2 puffs into the lungs every 6 (six) hours as needed for shortness of breath or wheezing. 18 g 2   amitriptyline (ELAVIL) 25 MG tablet  Take 25 mg by mouth at bedtime.     BIOTIN PO Take 1 tablet by mouth daily.     brimonidine (ALPHAGAN) 0.2 % ophthalmic solution 3 (three) times daily.     budesonide-formoterol (SYMBICORT) 160-4.5 MCG/ACT inhaler Inhale 2 puffs into the lungs daily. 1 each 6   CALCIUM PO Take 1 tablet by mouth daily.     diclofenac Sodium (VOLTAREN) 1 % GEL Apply 2 g topically daily as needed for pain.  ELDERBERRY PO Take 1 tablet by mouth daily.     fluticasone (FLONASE) 50 MCG/ACT nasal spray Place 1 spray into both nostrils in the morning. 18 mL 3   Fremanezumab-vfrm (AJOVY) 225 MG/1.5ML SOAJ Inject 225 mg into the skin every 30 (thirty) days. 4.5 mL 4   MAGNESIUM PO Take 1 tablet by mouth daily.     Multiple Vitamin (MULTIVITAMIN WITH MINERALS) TABS tablet Take 1 tablet by mouth 2 (two) times daily.     NURTEC 75 MG TBDP Take 75 mg by mouth daily as needed (migraine). 48 tablet 4   Oxycodone HCl 10 MG TABS SMARTSIG:1 Tablet(s) By Mouth 6 Times Daily PRN     oxyCODONE-acetaminophen (PERCOCET) 10-325 MG tablet Take 1 tablet as needed by mouth up to 5-6  times a day as needed for 30 days 162 tablet 0   pregabalin (LYRICA) 50 MG capsule Take 50 mg by mouth 2 (two) times daily.     pregabalin (LYRICA) 50 MG capsule Take 1 capsule by mouth twice a day 30 days 60 capsule 0   Semaglutide,0.25 or 0.5MG /DOS, (OZEMPIC, 0.25 OR 0.5 MG/DOSE,) 2 MG/1.5ML SOPN Inject 0.5 mg into the skin once a week. 4.5 mL 1   vitamin B-12 (CYANOCOBALAMIN) 100 MCG tablet Take 100 mcg by mouth daily.     No current facility-administered medications for this visit.    Allergies as of 10/03/2021 - Review Complete 10/03/2021  Allergen Reaction Noted   Dilaudid [hydromorphone hcl] Hives and Itching 03/17/2013   Iodinated contrast media Hives and Itching 10/23/2014   Levaquin [levofloxacin] Anaphylaxis 09/05/2014   Tangerine flavor Swelling 09/22/2013   Ace inhibitors Swelling 06/02/2014   Iodine-131 Itching and Rash 07/17/2016     Vitals: LMP  (LMP Unknown)  Last Weight:  Wt Readings from Last 1 Encounters:  07/18/21 281 lb 9.6 oz (127.7 kg)   Last Height:   Ht Readings from Last 1 Encounters:  07/18/21 5\' 4"  (1.626 m)     Physical exam: Exam: Gen: NAD, conversant      CV: Denies palpitations or chest pain or SOB. VS: Breathing at a normal rate. Not febrile. Eyes: Conjunctivae clear without exudates or hemorrhage  Neuro: Detailed Neurologic Exam  Speech:    Speech is normal; fluent and spontaneous with normal comprehension.  Cognition:    The patient is oriented to person, place, and time;     recent and remote memory intact;     language fluent;     normal attention, concentration,     fund of knowledge Cranial Nerves:    The pupils are equal, round, and reactive to light. Attempted, Cannot perform fundoscopic exam. Visual fields are full to finger confrontation. Extraocular movements are intact.  The face is symmetric with normal sensation. The palate elevates in the midline. Hearing intact. Voice is normal. Shoulder shrug is normal. The tongue has normal motion without fasciculations.   Coordination:    Normal finger to nose  Gait:    Normal native gait  Motor Observation:   no involuntary movements noted. Tone:    Appears normal  Posture:    Posture is normal. normal erect    Strength:    Strength is anti-gravity and symmetric in the upper and lower limbs.      Sensation: intact to LT        Assessment/Plan:  Morbidly obese patient with chronic migraines without aura. Angelica Ortiz is  life changing.  Before ajovy, baseline was at least 20 headache days out  of the month, at least 15 of those are moderate to severe migraines. Now she has reduces to 4 migraines a month and no headaches. It is working extremely well. The few times she did have one, she took one nurtec and it terminated the headache. It has made a huge difference. 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  Before ajovy, baseline was at least 20 headache days out of the month, at least 15 of those are moderate to severe migraines. Now she has reduces to 4 migraines a month and no headaches  Nurtec acutely: Now she has 4 migraines a month and no headaches (failed maxalt, imitrex and multiple triptans)  MRI brain: not completed  She has been to the eye doctor without any mention of papilledema   F/u 1 year: sent email to front staff to contact her for follow up, refilled meds  Discussed: To prevent or relieve headaches, try the following: Cool Compress. Lie down and place a cool compress on your head.  Avoid headache triggers. If certain foods or odors seem to have triggered your migraines in the past, avoid them. A headache diary might help you identify triggers.  Include physical activity in your daily routine. Try a daily walk or other moderate aerobic exercise.  Manage stress. Find healthy ways to cope with the stressors, such as delegating tasks on your to-do list.  Practice relaxation techniques. Try deep breathing, yoga, massage and visualization.  Eat regularly. Eating regularly scheduled meals and maintaining a healthy diet might help prevent headaches. Also, drink plenty of fluids.  Follow a regular sleep schedule. Sleep deprivation might contribute to headaches Consider biofeedback. With this mind-body technique, you learn to control certain bodily functions -- such as muscle tension, heart rate and blood pressure -- to prevent headaches or reduce headache pain.    Proceed to emergency room if you experience new or worsening symptoms or symptoms do not resolve, if you have new neurologic symptoms or if headache is severe, or for any concerning symptom.   Provided education and documentation from [employer] headache Society toolbox including articles on: chronic  migraine medication overuse headache, chronic migraines, prevention of migraines, behavioral and other nonpharmacologic treatments for headache.    No orders of the defined types were placed in this encounter.  Meds ordered this encounter  Medications   Fremanezumab-vfrm (AJOVY) 225 MG/1.5ML SOAJ    Sig: Inject 225 mg into the skin every 30 (thirty) days.    Dispense:  4.5 mL    Refill:  4   NURTEC 75 MG TBDP    Sig: Take 75 mg by mouth daily as needed (migraine).    Dispense:  48 tablet    Refill:  4    This is a 44-month supply.    Cc: Minette Brine, FNP,  Kerin Perna, NP  Sarina Ill, MD  [facility] [address] [place], [place] [address]  Phone [phone] Fax [phone]

## 2021-10-14 ENCOUNTER — Telehealth: Payer: Self-pay

## 2021-10-14 ENCOUNTER — Other Ambulatory Visit: Payer: Self-pay

## 2021-10-14 ENCOUNTER — Telehealth: Payer: Self-pay | Admitting: *Deleted

## 2021-10-14 MED ORDER — FLUTICASONE-SALMETEROL 250-50 MCG/ACT IN AEPB: 1.0000 | INHALATION_SPRAY | Freq: Two times a day (BID) | RESPIRATORY_TRACT | 2 refills | Status: DC

## 2021-10-14 NOTE — Telephone Encounter (Signed)
Advair sent per auto-sub policy. Okay to change as long as patient is aware and okay with this.

## 2021-10-14 NOTE — Telephone Encounter (Signed)
Completed Nurtec PA on Cover My Meds. Key: [id number]. Awaiting determination from [facility].

## 2021-10-14 NOTE — Addendum Note (Signed)
Addended by: Lois Huxley, Jeannett Senior L on: 10/14/2021 04:59 PM   Modules accepted: Orders

## 2021-10-14 NOTE — Telephone Encounter (Signed)
I received a PA request for this patient's Symbicort.  It looks like Advair Diskus is preferred on patient's ins and covered at $0.  If appropriate, can his Symbicort be changed?

## 2021-10-14 NOTE — Telephone Encounter (Signed)
Approved today Request Reference Number: [id number]. NURTEC TAB 75MG  ODT is approved through 04/06/2022. Your patient may now fill this prescription and it will be covered.

## 2021-10-15 ENCOUNTER — Other Ambulatory Visit: Payer: Self-pay

## 2021-10-17 ENCOUNTER — Encounter (INDEPENDENT_AMBULATORY_CARE_PROVIDER_SITE_OTHER): Payer: Self-pay | Admitting: Primary Care

## 2021-10-17 ENCOUNTER — Ambulatory Visit (INDEPENDENT_AMBULATORY_CARE_PROVIDER_SITE_OTHER): Payer: 59 | Admitting: Primary Care

## 2021-10-17 VITALS — BP 122/83 | HR 69 | Temp 97.9°F | Ht 64.0 in | Wt 287.6 lb

## 2021-10-17 DIAGNOSIS — R809 Proteinuria, unspecified: Secondary | ICD-10-CM

## 2021-10-17 DIAGNOSIS — Z6841 Body Mass Index (BMI) 40.0 and over, adult: Secondary | ICD-10-CM | POA: Diagnosis not present

## 2021-10-17 DIAGNOSIS — E538 Deficiency of other specified B group vitamins: Secondary | ICD-10-CM

## 2021-10-17 DIAGNOSIS — Z13220 Encounter for screening for lipoid disorders: Secondary | ICD-10-CM

## 2021-10-17 DIAGNOSIS — R7303 Prediabetes: Secondary | ICD-10-CM

## 2021-10-17 DIAGNOSIS — J4520 Mild intermittent asthma, uncomplicated: Secondary | ICD-10-CM

## 2021-10-17 DIAGNOSIS — E118 Type 2 diabetes mellitus with unspecified complications: Secondary | ICD-10-CM

## 2021-10-17 MED ORDER — OZEMPIC (0.25 OR 0.5 MG/DOSE) 2 MG/1.5ML ~~LOC~~ SOPN: 0.5000 mg | PEN_INJECTOR | SUBCUTANEOUS | 1 refills | Status: DC

## 2021-10-17 NOTE — Patient Instructions (Signed)
Calorie Counting for Weight Loss Calories are units of energy. Your body needs a certain number of calories from food to keep going throughout the day. When you eat or drink more calories than your body needs, your body stores the extra calories mostly as fat. When you eat or drink fewer calories than your body needs, your body burns fat to get the energy it needs. Calorie counting means keeping track of how many calories you eat and drink each day. Calorie counting can be helpful if you need to lose weight. If you eat fewer calories than your body needs, you should lose weight. Ask your health care provider what a healthy weight is for you. For calorie counting to work, you will need to eat the right number of calories each day to lose a healthy amount of weight per week. A dietitian can help you figure out how many calories you need in a day and will suggest ways to reach your calorie goal. A healthy amount of weight to lose each week is usually 1-2 lb (0.5-0.9 kg). This usually means that your daily calorie intake should be reduced by 500-750 calories. Eating 1,200-1,500 calories a day can help most women lose weight. Eating 1,500-1,800 calories a day can help most men lose weight. What do I need to know about calorie counting? Work with your health care provider or dietitian to determine how many calories you should get each day. To meet your daily calorie goal, you will need to: Find out how many calories are in each food that you would like to eat. Try to do this before you eat. Decide how much of the food you plan to eat. Keep a food log. Do this by writing down what you ate and how many calories it had. To successfully lose weight, it is important to balance calorie counting with a healthy lifestyle that includes regular activity. Where do I find calorie information?  The number of calories in a food can be found on a Nutrition Facts label. If a food does not have a Nutrition Facts label, try  to look up the calories online or ask your dietitian for help. Remember that calories are listed per serving. If you choose to have more than one serving of a food, you will have to multiply the calories per serving by the number of servings you plan to eat. For example, the label on a package of bread might say that a serving size is 1 slice and that there are 90 calories in a serving. If you eat 1 slice, you will have eaten 90 calories. If you eat 2 slices, you will have eaten 180 calories. How do I keep a food log? After each time that you eat, record the following in your food log as soon as possible: What you ate. Be sure to include toppings, sauces, and other extras on the food. How much you ate. This can be measured in cups, ounces, or number of items. How many calories were in each food and drink. The total number of calories in the food you ate. Keep your food log near you, such as in a pocket-sized notebook or on an app or website on your mobile phone. Some programs will calculate calories for you and show you how many calories you have left to meet your daily goal. What are some portion-control tips? Know how many calories are in a serving. This will help you know how many servings you can have of a certain   food. Use a measuring cup to measure serving sizes. You could also try weighing out portions on a kitchen scale. With time, you will be able to estimate serving sizes for some foods. Take time to put servings of different foods on your favorite plates or in your favorite bowls and cups so you know what a serving looks like. Try not to eat straight from a food's packaging, such as from a bag or box. Eating straight from the package makes it hard to see how much you are eating and can lead to overeating. Put the amount you would like to eat in a cup or on a plate to make sure you are eating the right portion. Use smaller plates, glasses, and bowls for smaller portions and to prevent  overeating. Try not to multitask. For example, avoid watching TV or using your computer while eating. If it is time to eat, sit down at a table and enjoy your food. This will help you recognize when you are full. It will also help you be more mindful of what and how much you are eating. What are tips for following this plan? Reading food labels Check the calorie count compared with the serving size. The serving size may be smaller than what you are used to eating. Check the source of the calories. Try to choose foods that are high in protein, fiber, and vitamins, and low in saturated fat, trans fat, and sodium. Shopping Read nutrition labels while you shop. This will help you make healthy decisions about which foods to buy. Pay attention to nutrition labels for low-fat or fat-free foods. These foods sometimes have the same number of calories or more calories than the full-fat versions. They also often have added sugar, starch, or salt to make up for flavor that was removed with the fat. Make a grocery list of lower-calorie foods and stick to it. Cooking Try to cook your favorite foods in a healthier way. For example, try baking instead of frying. Use low-fat dairy products. Meal planning Use more fruits and vegetables. One-half of your plate should be fruits and vegetables. Include lean proteins, such as chicken, turkey, and fish. Lifestyle Each week, aim to do one of the following: 150 minutes of moderate exercise, such as walking. 75 minutes of vigorous exercise, such as running. General information Know how many calories are in the foods you eat most often. This will help you calculate calorie counts faster. Find a way of tracking calories that works for you. Get creative. Try different apps or programs if writing down calories does not work for you. What foods should I eat?  Eat nutritious foods. It is better to have a nutritious, high-calorie food, such as an avocado, than a food with  few nutrients, such as a bag of potato chips. Use your calories on foods and drinks that will fill you up and will not leave you hungry soon after eating. Examples of foods that fill you up are nuts and nut butters, vegetables, lean proteins, and high-fiber foods such as whole grains. High-fiber foods are foods with more than 5 g of fiber per serving. Pay attention to calories in drinks. Low-calorie drinks include water and unsweetened drinks. The items listed above may not be a complete list of foods and beverages you can eat. Contact a dietitian for more information. What foods should I limit? Limit foods or drinks that are not good sources of vitamins, minerals, or protein or that are high in unhealthy fats. These   include: Candy. Other sweets. Sodas, specialty coffee drinks, alcohol, and juice. The items listed above may not be a complete list of foods and beverages you should avoid. Contact a dietitian for more information. How do I count calories when eating out? Pay attention to portions. Often, portions are much larger when eating out. Try these tips to keep portions smaller: Consider sharing a meal instead of getting your own. If you get your own meal, eat only half of it. Before you start eating, ask for a container and put half of your meal into it. When available, consider ordering smaller portions from the menu instead of full portions. Pay attention to your food and drink choices. Knowing the way food is cooked and what is included with the meal can help you eat fewer calories. If calories are listed on the menu, choose the lower-calorie options. Choose dishes that include vegetables, fruits, whole grains, low-fat dairy products, and lean proteins. Choose items that are boiled, broiled, grilled, or steamed. Avoid items that are buttered, battered, fried, or served with cream sauce. Items labeled as crispy are usually fried, unless stated otherwise. Choose water, low-fat milk,  unsweetened iced tea, or other drinks without added sugar. If you want an alcoholic beverage, choose a lower-calorie option, such as a glass of wine or light beer. Ask for dressings, sauces, and syrups on the side. These are usually high in calories, so you should limit the amount you eat. If you want a salad, choose a garden salad and ask for grilled meats. Avoid extra toppings such as bacon, cheese, or fried items. Ask for the dressing on the side, or ask for olive oil and vinegar or lemon to use as dressing. Estimate how many servings of a food you are given. Knowing serving sizes will help you be aware of how much food you are eating at restaurants. Where to find more information Centers for Disease Control and Prevention: www.cdc.gov U.[place]. Department of Agriculture: myplate.gov Summary Calorie counting means keeping track of how many calories you eat and drink each day. If you eat fewer calories than your body needs, you should lose weight. A healthy amount of weight to lose per week is usually 1-2 lb (0.5-0.9 kg). This usually means reducing your daily calorie intake by 500-750 calories. The number of calories in a food can be found on a Nutrition Facts label. If a food does not have a Nutrition Facts label, try to look up the calories online or ask your dietitian for help. Use smaller plates, glasses, and bowls for smaller portions and to prevent overeating. Use your calories on foods and drinks that will fill you up and not leave you hungry shortly after a meal. This information is not intended to replace advice given to you by your health care provider. Make sure you discuss any questions you have with your health care provider. Document Revised: 05/05/2019 Document Reviewed: 05/05/2019 Elsevier Patient Education  2023 Elsevier Inc.  

## 2021-10-17 NOTE — Progress Notes (Signed)
[facility]  Angelica Ortiz, is a 56 y.o. female  [id number]  [id number]  DOB - [birth year]  Chief Complaint  Patient presents with   Follow-up    Anxiety/depression       Subjective:   Angelica Ortiz is a 56 y.o. female here today for a follow up visit. Patient has No headache, No chest pain, No abdominal pain - No Nausea, No new weakness tingling or numbness, No Cough - shortness of breath  No problems updated.  Allergies  Allergen Reactions   Dilaudid [Hydromorphone Hcl] Hives and Itching   Iodinated Contrast Media Hives and Itching    Pt given 4 hour prep 06/21/20, c/o itching after injection- [facility]   Levaquin [Levofloxacin] Anaphylaxis   Tangerine Flavor Swelling    Tangerine fruit: Lip and tongue swelling with tangerines   Ace Inhibitors Swelling   Iodine-131 Itching and Rash    Past Medical History:  Diagnosis Date   Anemia    Arthritis    Asthma    Depression    Diabetes mellitus without complication (HCC)    Type 2   Diverticulitis    Falls    Fibromyalgia    Gait instability    GERD (gastroesophageal reflux disease)    History of urinary tract infection    Hypertension    Lupus (HCC)    no meds   Migraine    Morbid obesity 03/30/2013   Overactive bladder    Overactive bladder    Pulmonary embolism (HCC)    Resolved -At age 41 broken right leg, blood clot broke off - into lung   Seasonal allergies    Sleep apnea 06/05/2013   Does not have CPAP machine   Urinary incontinence     Current Outpatient Medications on File Prior to Visit  Medication Sig Dispense Refill   albuterol (VENTOLIN HFA) 108 (90 Base) MCG/ACT inhaler Inhale 2 puffs into the lungs every 6 (six) hours as needed for shortness of breath or wheezing. 18 g 2   BIOTIN PO Take 1 tablet by mouth daily.     brimonidine (ALPHAGAN) 0.2 % ophthalmic solution 3 (three) times daily.     CALCIUM PO Take 1 tablet by mouth daily.     diclofenac Sodium (VOLTAREN) 1 % GEL Apply 2 g  topically daily as needed for pain.     ELDERBERRY PO Take 1 tablet by mouth daily.     fluticasone (FLONASE) 50 MCG/ACT nasal spray Place 1 spray into both nostrils in the morning. 18 mL 3   fluticasone-salmeterol (ADVAIR DISKUS) 250-50 MCG/ACT AEPB Inhale 1 puff into the lungs in the morning and at bedtime. 60 each 2   Fremanezumab-vfrm (AJOVY) 225 MG/1.5ML SOAJ Inject 225 mg into the skin every 30 (thirty) days. 4.5 mL 4   MAGNESIUM PO Take 1 tablet by mouth daily.     Multiple Vitamin (MULTIVITAMIN WITH MINERALS) TABS tablet Take 1 tablet by mouth 2 (two) times daily.     NURTEC 75 MG TBDP Take 75 mg by mouth daily as needed (migraine). 48 tablet 4   Oxycodone HCl 10 MG TABS SMARTSIG:1 Tablet(s) By Mouth 6 Times Daily PRN     oxyCODONE-acetaminophen (PERCOCET) 10-325 MG tablet Take 1 tablet as needed by mouth up to 5-6  times a day as needed for 30 days 162 tablet 0   pregabalin (LYRICA) 50 MG capsule Take 50 mg by mouth 2 (two) times daily.     pregabalin (LYRICA) 50 MG capsule Take 1  capsule by mouth twice a day 30 days 60 capsule 0   vitamin B-12 (CYANOCOBALAMIN) 100 MCG tablet Take 100 mcg by mouth daily.     No current facility-administered medications on file prior to visit.    Objective:   Vitals:   10/17/21 0948  BP: 122/83  Pulse: 69  Temp: 97.9 F (36.6 C)  TempSrc: Oral  SpO2: 97%  Weight: 287 lb 9.6 oz (130.5 kg)  Height: 5\' 4"  (1.626 m)    Exam General appearance : Awake, alert, not in any distress. Speech Clear. Not toxic looking HEENT: Atraumatic and Normocephalic, pupils equally reactive to light and accomodation Neck: Supple, no JVD. No cervical lymphadenopathy.  Chest: Good air entry bilaterally, no added sounds  CVS: S1 S2 regular, no murmurs.  Abdomen: Bowel sounds present, Non tender and not distended with no gaurding, rigidity or rebound. Extremities: B/L Lower Ext shows no edema, both legs are warm to touch Neurology: Awake alert, and oriented X 3, CN  II-XII intact, Non focal Skin: No Rash  Data Review Lab Results  Component Value Date   HGBA1C 5.5 07/18/2021   HGBA1C 5.8 (H) 10/18/2020   HGBA1C 5.9 (H) 03/09/2015    Assessment & Plan  Angelica Ortiz was seen today for follow-up.  Diagnoses and all orders for this visit:  Lipid screening  Healthy lifestyle diet of fruits vegetables fish nuts whole grains and low saturated fat . Foods high in cholesterol or liver, fatty meats,cheese, butter avocados, nuts and seeds, chocolate and fried foods. -     Lipid Panel  Morbid obesity (HCC) Morbid Obesity is > 40  BMI hx of gastric bypass indicating an excess in caloric intake or underlining conditions. This may lead to other co-morbidities. Lifestyle modifications of diet and exercise may reduce obesity.     B12 deficiency -     Vitamin B12  Controlled type 2 diabetes mellitus with complication, without long-term current use of insulin (HCC) Comments: Stable, continue current medications Orders: -     Semaglutide,0.25 or 0.5MG /DOS, (OZEMPIC, 0.25 OR 0.5 MG/DOSE,) 2 MG/1.5ML SOPN; Inject 0.5 mg into the skin once a week.   Patient have been counseled extensively about nutrition and exercise. Other issues discussed during this visit include: low cholesterol diet, weight control and daily exercise, foot care, annual eye examinations at Ophthalmology, importance of adherence with medications and regular follow-up. We also discussed long term complications of uncontrolled diabetes and hypertension.   Return in about 3 months (around 01/17/2022) for DM/weight .  The patient was given clear instructions to go to ER or return to medical center if symptoms don't improve, worsen or new problems develop. The patient verbalized understanding. The patient was told to call to get lab results if they haven't heard anything in the next week.   This note has been created with 01/19/2022. Any transcriptional  errors are unintentional.   Education officer, environmental, NP 10/20/2021, 9:59 PM

## 2021-10-18 LAB — LIPID PANEL
Chol/HDL Ratio: 1.9 ratio (ref 0.0–4.4)
Cholesterol, Total: 153 mg/dL (ref 100–199)
HDL: 80 mg/dL (ref >39)
LDL Chol Calc (NIH): 60 mg/dL (ref 0–99)
Triglycerides: 67 mg/dL (ref 0–149)
VLDL Cholesterol Cal: 13 mg/dL (ref 5–40)

## 2021-10-18 LAB — VITAMIN B12: Vitamin B-12: >2000 pg/mL — ABNORMAL HIGH (ref 232–1245)

## 2021-10-24 ENCOUNTER — Other Ambulatory Visit (HOSPITAL_COMMUNITY): Payer: Self-pay

## 2021-10-24 MED ORDER — OXYCODONE-ACETAMINOPHEN 10-325 MG PO TABS
ORAL_TABLET | ORAL | 0 refills | Status: DC
  Filled 2021-10-24: qty 162, 30d supply, fill #0

## 2021-10-24 MED ORDER — PREGABALIN 50 MG PO CAPS
ORAL_CAPSULE | ORAL | 0 refills | Status: DC
  Filled 2021-10-24: qty 60, 30d supply, fill #0

## 2021-11-05 ENCOUNTER — Other Ambulatory Visit (INDEPENDENT_AMBULATORY_CARE_PROVIDER_SITE_OTHER): Payer: Self-pay

## 2021-11-05 DIAGNOSIS — J4520 Mild intermittent asthma, uncomplicated: Secondary | ICD-10-CM

## 2021-11-05 MED ORDER — FLUTICASONE PROPIONATE 50 MCG/ACT NA SUSP: 1.0000 | Freq: Every morning | NASAL | 3 refills | Status: DC

## 2021-11-13 ENCOUNTER — Other Ambulatory Visit: Payer: Self-pay

## 2021-11-15 ENCOUNTER — Telehealth (INDEPENDENT_AMBULATORY_CARE_PROVIDER_SITE_OTHER): Payer: Self-pay

## 2021-11-15 ENCOUNTER — Encounter (INDEPENDENT_AMBULATORY_CARE_PROVIDER_SITE_OTHER): Payer: 59

## 2021-11-15 NOTE — Telephone Encounter (Signed)
pt is working today and unable to stop for appt any time today she would like to reschedule for next the Friday available. Appt rescheduled 11/29/21

## 2021-11-20 NOTE — Progress Notes (Signed)
    S:    PCP: Angelica Ortiz is a 56 y.o. female who presents for weight management and diabetes evaluation, education, and management. PMH is significant for T2DM, depression. asthma, lupus, migraine, obesity, OAB, gastric bypass, HTN and sleep apnea.  Patient was referred and last seen by Primary Care Provider, NP Gwinda Passe, on 10/17/21.   At last visit, no changes were made to current regimen d/t diabetes control with Ozempic. Has been on Ozempic since 05/2021. Of note, she underwent a gastric bypass in December of 2016.   Today, she reports doing well. She has no NV, abdominal pain or vision changes as it relates to the Ozempic. She is in good spirits and presents without any assistance.  Family/Social History:  FH: HTN, cancer, DM, CVD Social hx: denies alcohol and tobacco use  Current diabetes medications include: Ozempic 0.5mg  once weekly Patient reports adherence to taking all medications as prescribed.   Insurance coverage: [employer]   Patient denies hypoglycemic events.  Uses app provided by her work to track blood sugars.  Reported home fasting blood sugars: most goal sugars reported - 90s - 100s.   Patient denies nocturia (nighttime urination).  Patient denies neuropathy (nerve pain). Patient denies visual changes. Patient reports self foot exams.   Patient reported dietary habits:  - Admits to snacking recently, sweets and snack foods throughout the day   Patient-reported exercise habits:  - R knee pain, back and shoulder pain, has a hx of shoulder surgery - Limited exercise d/t to the above   O:  Lab Results  Component Value Date   HGBA1C 5.5 07/18/2021   There were no vitals filed for this visit.  Lipid Panel     Component Value Date/Time   CHOL 153 10/17/2021 1043   TRIG 67 10/17/2021 1043   HDL 80 10/17/2021 1043   CHOLHDL 1.9 10/17/2021 1043   LDLCALC 60 10/17/2021 1043   Clinical Atherosclerotic Cardiovascular Disease  (ASCVD): No  The 10-year ASCVD risk score (Arnett DK, et al., 2019) is: 1.6%   Values used to calculate the score:     Age: 71 years     Sex: Female     Is Non-Hispanic African American: No     Diabetic: Yes     Tobacco smoker: No     Systolic Blood Pressure: 122 mmHg     Is BP treated: No     HDL Cholesterol: 80 mg/dL     Total Cholesterol: 153 mg/dL   A/P: Diabetes longstanding  currently controlled. Patient is able to verbalize appropriate hypoglycemia management plan. Medication adherence appears .  -Increased dose of Ozempic to 1mg  once weekly.  -Patient educated on purpose, proper use, and potential adverse effects of Ozempic.  -Extensively discussed pathophysiology of diabetes, recommended lifestyle interventions, dietary effects on blood sugar control.  -Counseled on s/sx of and management of hypoglycemia.  -Next A1c anticipated 01/2022.   Written patient instructions provided. Patient verbalized understanding of treatment plan.  Total time in face to face counseling 30 minutes.    Follow-up:  Pharmacist in 1 month. PCP clinic visit in 10/19.   11/19, PharmD, Butch Penny, CPP Clinical Pharmacist [facility] [place] [phone]

## 2021-11-21 ENCOUNTER — Ambulatory Visit: Payer: 59 | Attending: Primary Care | Admitting: Pharmacist

## 2021-11-21 ENCOUNTER — Other Ambulatory Visit: Payer: Self-pay

## 2021-11-21 DIAGNOSIS — E118 Type 2 diabetes mellitus with unspecified complications: Secondary | ICD-10-CM

## 2021-11-21 DIAGNOSIS — Z6841 Body Mass Index (BMI) 40.0 and over, adult: Secondary | ICD-10-CM

## 2021-11-21 MED ORDER — SEMAGLUTIDE (1 MG/DOSE) 4 MG/3ML ~~LOC~~ SOPN: 1.0000 mg | PEN_INJECTOR | SUBCUTANEOUS | 2 refills | Status: DC

## 2021-11-21 MED ORDER — SEMAGLUTIDE (1 MG/DOSE) 4 MG/3ML ~~LOC~~ SOPN
1.0000 mg | PEN_INJECTOR | SUBCUTANEOUS | 2 refills | Status: DC
  Filled 2021-11-21: qty 3, 28d supply, fill #0
  Filled 2021-12-17: qty 3, 28d supply, fill #1

## 2021-11-26 ENCOUNTER — Other Ambulatory Visit (HOSPITAL_COMMUNITY): Payer: Self-pay

## 2021-11-26 MED ORDER — CYCLOBENZAPRINE HCL 10 MG PO TABS
ORAL_TABLET | ORAL | 2 refills | Status: DC
  Filled 2021-11-26: qty 30, 30d supply, fill #0
  Filled 2021-12-27: qty 30, 30d supply, fill #1
  Filled 2022-02-05: qty 30, 30d supply, fill #2

## 2021-11-26 MED ORDER — PREGABALIN 50 MG PO CAPS
50.0000 mg | ORAL_CAPSULE | Freq: Two times a day (BID) | ORAL | 0 refills | Status: DC
  Filled 2021-11-26: qty 60, 30d supply, fill #0

## 2021-11-26 MED ORDER — OXYCODONE-ACETAMINOPHEN 10-325 MG PO TABS
ORAL_TABLET | ORAL | 0 refills | Status: DC
  Filled 2021-11-26: qty 150, 30d supply, fill #0

## 2021-11-27 ENCOUNTER — Other Ambulatory Visit (HOSPITAL_COMMUNITY): Payer: Self-pay

## 2021-11-28 NOTE — Telephone Encounter (Signed)
Received notice to do Ajovy PA. This was completed on Cover My Meds. Key: [id number]. Awaiting determination from [employer] Medicare Part D.

## 2021-11-29 ENCOUNTER — Ambulatory Visit (INDEPENDENT_AMBULATORY_CARE_PROVIDER_SITE_OTHER): Payer: 59

## 2021-11-29 ENCOUNTER — Encounter (INDEPENDENT_AMBULATORY_CARE_PROVIDER_SITE_OTHER): Payer: Self-pay

## 2021-11-29 DIAGNOSIS — Z0000 Encounter for general adult medical examination without abnormal findings: Secondary | ICD-10-CM

## 2021-11-29 NOTE — Progress Notes (Signed)
Subjective:   Angelica Ortiz is a 56 y.o. female who presents for an Initial Medicare Annual Wellness Visit. I connected with  [name] on 11/29/21 by a audio enabled telemedicine application and verified that I am speaking with the correct person using two identifiers.  Patient Location: Home  Provider Location: Office/Clinic  I discussed the limitations of evaluation and management by telemedicine. The patient expressed understanding and agreed to proceed.     Objective:    There were no vitals filed for this visit. There is no height or weight on file to calculate BMI.     12/06/2020   10:58 AM 11/01/2020   12:43 PM 10/30/2020    1:19 PM 06/20/2020    9:53 PM 03/13/2015   12:35 PM 03/09/2015   11:38 AM 11/21/2014    8:37 PM  Advanced Directives  Does Patient Have a Medical Advance Directive? No No No No No No No  Would patient like information on creating a medical advance directive? No - Patient declined No - Patient declined  No - Patient declined No - patient declined information Yes - Educational materials given No - patient declined information    Current Medications (verified) Outpatient Encounter Medications as of 11/29/2021  Medication Sig   albuterol (VENTOLIN HFA) 108 (90 Base) MCG/ACT inhaler Inhale 2 puffs into the lungs every 6 (six) hours as needed for shortness of breath or wheezing.   BIOTIN PO Take 1 tablet by mouth daily.   brimonidine (ALPHAGAN) 0.2 % ophthalmic solution 3 (three) times daily.   CALCIUM PO Take 1 tablet by mouth daily.   cyclobenzaprine (FLEXERIL) 10 MG tablet Take 1 tablet by mouth once daily as needed for severe muscle spasms   diclofenac Sodium (VOLTAREN) 1 % GEL Apply 2 g topically daily as needed for pain.   ELDERBERRY PO Take 1 tablet by mouth daily.   fluticasone (FLONASE) 50 MCG/ACT nasal spray Place 1 spray into both nostrils in the morning.   fluticasone-salmeterol (ADVAIR DISKUS) 250-50 MCG/ACT AEPB Inhale 1 puff into the lungs in  the morning and at bedtime.   Fremanezumab-vfrm (AJOVY) 225 MG/1.5ML SOAJ Inject 225 mg into the skin every 30 (thirty) days.   MAGNESIUM PO Take 1 tablet by mouth daily.   Multiple Vitamin (MULTIVITAMIN WITH MINERALS) TABS tablet Take 1 tablet by mouth 2 (two) times daily.   NURTEC 75 MG TBDP Take 75 mg by mouth daily as needed (migraine).   Oxycodone HCl 10 MG TABS SMARTSIG:1 Tablet(s) By Mouth 6 Times Daily PRN   oxyCODONE-acetaminophen (PERCOCET) 10-325 MG tablet Take 1 tablet by mouth up to 5 times a day as needed for severe pain only   pregabalin (LYRICA) 50 MG capsule Take 50 mg by mouth 2 (two) times daily.   pregabalin (LYRICA) 50 MG capsule Take 1 capsule (50 mg total) by mouth 2 (two) times daily.   Semaglutide, 1 MG/DOSE, 4 MG/3ML SOPN Inject 1 mg as directed once a week.   vitamin B-12 (CYANOCOBALAMIN) 100 MCG tablet Take 100 mcg by mouth daily.   No facility-administered encounter medications on file as of 11/29/2021.    Allergies (verified) Dilaudid [hydromorphone hcl], Iodinated contrast media, Levaquin [levofloxacin], Tangerine flavor, Ace inhibitors, and Iodine-131   History: Past Medical History:  Diagnosis Date   Anemia    Arthritis    Asthma    Depression    Diabetes mellitus without complication (HCC)    Type 2   Diverticulitis    Falls  Fibromyalgia    Gait instability    GERD (gastroesophageal reflux disease)    History of urinary tract infection    Hypertension    Lupus (HCC)    no meds   Migraine    Morbid obesity 03/30/2013   Overactive bladder    Overactive bladder    Pulmonary embolism (HCC)    Resolved -At age 56 broken right leg, blood clot broke off - into lung   Seasonal allergies    Sleep apnea 06/05/2013   Does not have CPAP machine   Urinary incontinence    Past Surgical History:  Procedure Laterality Date   ABDOMINAL HYSTERECTOMY     APPENDECTOMY     CESAREAN SECTION  04/08/1999   x 1   CHOLECYSTECTOMY     DILATION AND  CURETTAGE OF UTERUS     x 3 for AUB   GASTRIC ROUX-EN-Y N/A 03/13/2015   Procedure: LAPAROSCOPIC ROUX-EN-Y GASTRIC BYPASS  ENTEROLYSIS OF ADHESIONS WITH UPPER ENDOSCOPY;  Surgeon: Ovidio Kinavid Newman, MD;  Location: [facility];  Service: General;  Laterality: N/A;   HYSTEROSCOPY WITH NOVASURE N/A 03/14/2014   Procedure: HYSTEROSCOPY WITH NOVASURE;  Surgeon: Willodean Rosenthalarolyn Harraway-Smith, MD;  Location: [facility];  Service: Gynecology;  Laterality: N/A;   LAMINECTOMY     L4-L5   LAPAROSCOPIC LYSIS OF ADHESIONS  03/13/2015   Procedure: LAPAROSCOPIC LYSIS OF ADHESIONS;  Surgeon: Ovidio Kinavid Newman, MD;  Location: [facility];  Service: General;;   LAPAROSCOPIC OOPHERECTOMY Right 09/05/2014   Procedure: abdominal OOPHERECTOMY;  Surgeon: Willodean Rosenthalarolyn Harraway-Smith, MD;  Location: [facility];  Service: Gynecology;  Laterality: Right;   LAPAROSCOPIC OVARIAN CYSTECTOMY Right 04/07/1992   ORIF ANKLE FRACTURE Left    OVARIAN CYST REMOVAL Left 09/05/2014   Procedure: OVARIAN CYSTECTOMY and portion of left ovary removed ;  Surgeon: Willodean Rosenthalarolyn Harraway-Smith, MD;  Location: [facility];  Service: Gynecology;  Laterality: Left;   ROTATOR CUFF REPAIR     right   ROTATOR CUFF REPAIR Right    SALPINGOOPHORECTOMY Bilateral 09/05/2014   Procedure: BILATERAL SALPINGECTOMY;  Surgeon: Willodean Rosenthalarolyn Harraway-Smith, MD;  Location: [facility];  Service: Gynecology;  Laterality: Bilateral;   SHOULDER ARTHROSCOPY WITH ROTATOR CUFF REPAIR AND SUBACROMIAL DECOMPRESSION Left 11/01/2020   Procedure: SHOULDER ARTHROSCOPY WITH ROTATOR CUFF REPAIR AND SUBACROMIAL DECOMPRESSION, DISTAL CLAVICLE EXCISION;  Surgeon: Jones Broomhandler, Justin, MD;  Location: [facility];  Service: Orthopedics;  Laterality: Left;   SUPRACERVICAL ABDOMINAL HYSTERECTOMY N/A 09/05/2014   Procedure: HYSTERECTOMY SUPRACERVICAL ABDOMINAL;  Surgeon: Willodean Rosenthalarolyn Harraway-Smith, MD;  Location: [facility];  Service: Gynecology;  Laterality: N/A;   TONSILLECTOMY     UPPER GI ENDOSCOPY  03/13/2015   Procedure: UPPER GI ENDOSCOPY;   Surgeon: Ovidio Kinavid Newman, MD;  Location: [facility];  Service: General;;   Family History  Problem Relation Age of Onset   Diabetes Mother    Hypertension Mother    Heart disease Mother    Cancer Father    Heart disease Father    Hypertension Sister    Cancer Sister    Diabetes Brother    Hypertension Brother    Cancer Maternal Uncle    Cancer Paternal Uncle    Breast cancer Neg Hx    Social History   Socioeconomic History   Marital status: Divorced    Spouse name: Not on file   Number of children: Not on file   Years of education: Not on file   Highest education level: Not on file  Occupational History   Not on file  Tobacco Use   Smoking  status: Never   Smokeless tobacco: Never  Vaping Use   Vaping Use: Never used  Substance and Sexual Activity   Alcohol use: No   Drug use: No   Sexual activity: Not Currently    Birth control/protection: None  Other Topics Concern   Not on file  Social History Narrative   Not on file   Social Determinants of Health   Financial Resource Strain: Low Risk  (12/06/2020)   Overall Financial Resource Strain (CARDIA)    Difficulty of Paying Living Expenses: Not hard at all  Food Insecurity: No Food Insecurity (12/06/2020)   Hunger Vital Sign    Worried About Running Out of Food in the Last Year: Never true    Ran Out of Food in the Last Year: Never true  Transportation Needs: No Transportation Needs (12/06/2020)   PRAPARE - Administrator, Civil Service (Medical): No    Lack of Transportation (Non-Medical): No  Physical Activity: Inactive (12/06/2020)   Exercise Vital Sign    Days of Exercise per Week: 0 days    Minutes of Exercise per Session: 0 min  Stress: No Stress Concern Present (12/06/2020)   [employer] of Occupational Health - Occupational Stress Questionnaire    Feeling of Stress : Not at all  Social Connections: Not on file    Tobacco Counseling Counseling given: Not Answered   Clinical Intake:                  Diabetic?yes Nutrition Risk Assessment:  Has the patient had any N/V/D within the last 2 months?  Yes  Does the patient have any non-healing wounds?  No  Has the patient had any unintentional weight loss or weight gain?  No   Diabetes:  Is the patient diabetic?  Yes  If diabetic, was a CBG obtained today?  Yes  Did the patient bring in their glucometer from home?   Home check 97 How often do you monitor your CBG's? Daily .   Financial Strains and Diabetes Management:  Are you having any financial strains with the device, your supplies or your medication? No .  Does the patient want to be seen by Chronic Care Management for management of their diabetes?  No  Would the patient like to be referred to a Nutritionist or for Diabetic Management?  No   Diabetic Exams:  Diabetic Eye Exam: Completed 01/25/2021 Diabetic Foot Exam: Overdue, Pt has been advised about the importance in completing this exam. Pt is scheduled for diabetic foot exam on NA.          Activities of Daily Living    12/06/2020   11:01 AM  In your present state of health, do you have any difficulty performing the following activities:  Hearing? 0  Vision? 1  Comment can't see distance  Difficulty concentrating or making decisions? 0  Walking or climbing stairs? 1  Dressing or bathing? 1  Comment due to arm  Doing errands, shopping? 1  Comment due to arm  Preparing Food and eating ? N  Using the Toilet? N  In the past six months, have you accidently leaked urine? N  Do you have problems with loss of bowel control? N  Managing your Medications? N  Managing your Finances? N  Housekeeping or managing your Housekeeping? Y    Patient Care Team: Grayce Sessions, NP as PCP - General (Internal Medicine)  Indicate any recent Medical Services you may have received from other than [facility]  providers in the past year (date may be approximate).     Assessment:   This is a routine wellness  examination for Jacci.  Hearing/Vision screen No results found.  Dietary issues and exercise activities discussed:     Goals Addressed   None   Depression Screen    10/17/2021    9:47 AM 07/18/2021    9:21 AM 12/06/2020   11:01 AM 10/18/2020   11:58 AM 02/11/2016   11:57 AM 10/29/2015    5:51 PM 08/27/2015    5:55 PM  PHQ 2/9 Scores  PHQ - 2 Score 2 2 0 3 0 0 0  PHQ- 9 Score Fall Risk    10/17/2021    9:47 AM 07/18/2021    9:21 AM 12/06/2020   10:59 AM 02/11/2016   11:57 AM 10/29/2015    5:51 PM  Fall Risk   Falls in the past year? 0 0 1 No No  Comment   knee gives out    Number falls in past yr:   1    Injury with Fall?   0    Risk for fall due to :   History of fall(s);Impaired mobility;Medication side effect    Follow up   Falls evaluation completed;Education provided;Falls prevention discussed      FALL RISK PREVENTION PERTAINING TO THE HOME:  Any stairs in or around the home? No  If so, are there any without handrails? Yes  Home free of loose throw rugs in walkways, pet beds, electrical cords, etc? Yes  Adequate lighting in your home to reduce risk of falls? Yes   ASSISTIVE DEVICES UTILIZED TO PREVENT FALLS:  Life alert? No  Use of a cane, walker or w/c? Yes  Grab bars in the bathroom? Yes  Shower chair or bench in shower? No  Elevated toilet seat or a handicapped toilet? No   Cognitive Function:        12/06/2020   11:03 AM  6CIT Screen  What Year? 0 points  What month? 0 points  What time? 0 points  Count back from 20 0 points  Months in reverse 0 points  Repeat phrase 0 points  Total Score 0 points    Immunizations Immunization History  Administered Date(s) Administered   Influenza,inj,Quad PF,6+ Mos 12/06/2020   PFIZER(Purple Top)SARS-COV-2 Vaccination 06/17/2019, 07/08/2019, 01/24/2020, 07/28/2020   Pneumococcal Polysaccharide-23 12/06/2020    TDAP status: Up to date  Flu Vaccine status: Due, Education has been provided  regarding the importance of this vaccine. Advised may receive this vaccine at local pharmacy or Health Dept. Aware to provide a copy of the vaccination record if obtained from local pharmacy or Health Dept. Verbalized acceptance and understanding.  Pneumococcal vaccine status: Up to date  Covid-19 vaccine status: Completed vaccines  Qualifies for Shingles Vaccine? Yes   Zostavax completed Yes   Shingrix Completed?: Yes  Screening Tests Health Maintenance  Topic Date Due   URINE MICROALBUMIN  Never done   Zoster Vaccines- Shingrix (1 of 2) Never done   COVID-19 Vaccine (5 - Pfizer risk series) 09/22/2020   INFLUENZA VACCINE  11/05/2021   TETANUS/TDAP  07/19/2022 (Originally [birth year]1986)   FOOT EXAM  12/06/2021   HEMOGLOBIN A1C  01/17/2022   OPHTHALMOLOGY EXAM  01/25/2022   MAMMOGRAM  08/30/2023   COLONOSCOPY (Pts 45-27yrs Insurance coverage will need to be confirmed)  07/05/2024   Hepatitis C Screening  Completed   HIV Screening  Completed   HPV VACCINES  Aged Out   PAP SMEAR-Modifier  Discontinued    Health Maintenance  Health Maintenance Due  Topic Date Due   URINE MICROALBUMIN  Never done   Zoster Vaccines- Shingrix (1 of 2) Never done   COVID-19 Vaccine (5 - Pfizer risk series) 09/22/2020   INFLUENZA VACCINE  11/05/2021    Colorectal cancer screening: Type of screening: Colonoscopy. Completed 2021. Repeat every 10 years  Mammogram status: Completed 08/29/2021. Repeat every year    Lung Cancer Screening: (Low Dose CT Chest recommended if Age 30-80 years, 30 pack-year currently smoking OR have quit w/in 15years.) does not qualify.   Lung Cancer Screening Referral: na  Additional Screening:  Hepatitis C Screening: does qualify; Completed yes  Vision Screening: Recommended annual ophthalmology exams for early detection of glaucoma and other disorders of the eye. Is the patient up to date with their annual eye exam?  Yes  Who is the provider or what is the name of  the office in which the patient attends annual eye exams? Dr Nedra Hai If pt is not established with a provider, would they like to be referred to a provider to establish care?  na .   Dental Screening: Recommended annual dental exams for proper oral hygiene  Community Resource Referral / Chronic Care Management: CRR required this visit?  No   CCM required this visit?  No      Plan:     I have personally reviewed and noted the following in the patient's chart:   Medical and social history Use of alcohol, tobacco or illicit drugs  Current medications and supplements including opioid prescriptions. Patient is currently taking opioid prescriptions. Information provided to patient regarding non-opioid alternatives. Patient advised to discuss non-opioid treatment plan with their provider. Functional ability and status Nutritional status Physical activity Advanced directives List of other physicians Hospitalizations, surgeries, and ER visits in previous 12 months Vitals Screenings to include cognitive, depression, and falls Referrals and appointments  In addition, I have reviewed and discussed with patient certain preventive protocols, quality metrics, and best practice recommendations. A written personalized care plan for preventive services as well as general preventive health recommendations were provided to patient.     Delana Meyer   11/29/2021   Nurse Notes:   Ms. Duell , Thank you for taking time to come for your Medicare Wellness Visit. I appreciate your ongoing commitment to your health goals. Please review the following plan we discussed and let me know if I can assist you in the future.   These are the goals we discussed:  Goals      Patient Stated     12/06/2020, continue to lose weight (185 pounds), heal arm        This is a list of the screening recommended for you and due dates:  Health Maintenance  Topic Date Due   Urine Protein Check  Never done   Zoster  (Shingles) Vaccine (1 of 2) Never done   COVID-19 Vaccine (5 - Pfizer risk series) 09/22/2020   Flu Shot  11/05/2021   Tetanus Vaccine  07/19/2022*   Complete foot exam   12/06/2021   Hemoglobin A1C  01/17/2022   Eye exam for diabetics  01/25/2022   Mammogram  08/30/2023   Colon Cancer Screening  07/05/2024   Hepatitis C Screening: USPSTF Recommendation to screen - Ages 18-79 yo.  Completed   HIV Screening  Completed   HPV Vaccine  Aged Out   Pap  Smear  Discontinued  *Topic was postponed. The date shown is not the original due date.

## 2021-11-29 NOTE — Patient Instructions (Signed)

## 2021-12-16 ENCOUNTER — Telehealth (INDEPENDENT_AMBULATORY_CARE_PROVIDER_SITE_OTHER): Payer: Self-pay | Admitting: Primary Care

## 2021-12-16 DIAGNOSIS — E118 Type 2 diabetes mellitus with unspecified complications: Secondary | ICD-10-CM

## 2021-12-16 NOTE — Telephone Encounter (Addendum)
Angelica Ortiz w/ [facility] calling to let you know pt's prior auth for her Semaglutide, 1 MG/DOSE, 4 MG/3ML SOPN Has been approved. So you do not need to keep sending prior auth request for this med. In addition, she said this medication needs to be sent to mail order: [facility] ([facility]) - [place],  - [phone] [address]

## 2021-12-16 NOTE — Telephone Encounter (Signed)
Update on documentation. After listening to the call, Darral Dash with [facility] called to say the PA for Ozempic has been approved from 04/07/21-04/06/22 and the medication will need to be sent to the mail order not local pharmacy. It's too early to refill at this time. She says no need to keep sending a PA request.

## 2021-12-17 ENCOUNTER — Other Ambulatory Visit: Payer: Self-pay

## 2021-12-18 ENCOUNTER — Other Ambulatory Visit: Payer: Self-pay

## 2021-12-19 ENCOUNTER — Ambulatory Visit: Payer: Medicare Other

## 2021-12-24 ENCOUNTER — Other Ambulatory Visit: Payer: Self-pay

## 2021-12-25 NOTE — Progress Notes (Deleted)
S:     PCP: Angelica Ortiz   Angelica Ortiz is a 56 y.o. female who presents for weight management and diabetes evaluation, education, and management. PMH is significant for T2DM, depression. asthma, lupus, migraine, obesity, OAB, gastric bypass, HTN and sleep apnea.   Patient was referred and last seen by Primary Care Provider, NP Angelica Ortiz, on 10/17/21. At that visit, no changes were made to current regimen d/t diabetes control with Ozempic. Has been on Ozempic since 05/2021. Of note, she underwent a gastric bypass in December of 2016.    Angelica Ortiz followed up with pharmacy team on 8/17. She reported no GI side effects with her Ozempic. Her dose was increased at that time to 1 mg once a week.  Today, patient arrives in *** good spirits and presents without *** any assistance. ***  Family/Social History:  FH: HTN, cancer, DM, CVD Social hx: denies alcohol and tobacco use  Current diabetes medications include: Ozempic 1mg  once a week  Patient reports adherence to taking all medications as prescribed.  *** Patient denies adherence with medications, reports missing *** medications *** times per week, on average.  Do you feel that your medications are working for you? {YES NO:22349} Have you been experiencing any side effects to the medications prescribed? {YES NO:22349} Do you have any problems obtaining medications due to transportation or finances? {YES QI:[id number] Insurance coverage: [employer] [employer]  Patient {Actions; denies-reports:120008} hypoglycemic events.  Reported home fasting blood sugars: ***  Reported 2 hour post-meal/random blood sugars: ***.  Patient denies nocturia (nighttime urination).  Patient denies neuropathy (nerve pain). Patient denies visual changes. Patient reports self foot exams.    Patient reported dietary habits:  - Admits to snacking recently, sweets and snack foods throughout the day    Patient-reported exercise habits:  - R knee pain, back  and shoulder pain, has a hx of shoulder surgery - Limited exercise d/t to the above    O:   Lab Results  Component Value Date   HGBA1C 5.5 07/18/2021   There were no vitals filed for this visit.  Lipid Panel     Component Value Date/Time   CHOL 153 10/17/2021 1043   TRIG 67 10/17/2021 1043   HDL 80 10/17/2021 1043   CHOLHDL 1.9 10/17/2021 1043   LDLCALC 60 10/17/2021 1043    Clinical Atherosclerotic Cardiovascular Disease (ASCVD): No  The 10-year ASCVD risk score (Arnett DK, et al., 2019) is: 1.6%   Values used to calculate the score:     Age: 89 years     Sex: Female     Is Non-Hispanic African American: No     Diabetic: Yes     Tobacco smoker: No     Systolic Blood Pressure: 123XX123 mmHg     Is BP treated: No     HDL Cholesterol: 80 mg/dL     Total Cholesterol: 153 mg/dL   A/P: Diabetes longstanding *** currently ***. Patient is *** able to verbalize appropriate hypoglycemia management plan. Medication adherence appears ***. Control is suboptimal due to ***. -{Meds adjust:18428} basal insulin *** (insulin ***). Patient will continue to titrate 1 unit every *** days if fasting blood sugar > 100mg /dl until fasting blood sugars reach goal or next visit.  -{Meds adjust:18428} rapid insulin *** (insulin ***) to ***.  -{Meds adjust:18428} GLP-1 *** (generic ***) to ***.  -{Meds adjust:18428} SGLT2-I *** (generic ***) to ***. Counseled on sick day rules. -{Meds adjust:18428} metformin *** to ***.  -Patient  educated on purpose, proper use, and potential adverse effects of ***.  -Extensively discussed pathophysiology of diabetes, recommended lifestyle interventions, dietary effects on blood sugar control.  -Counseled on s/sx of and management of hypoglycemia.  -Next A1c anticipated ***.   ASCVD risk - primary prevention in patient with diabetes. Last LDL is at goal of <70 mg/dL. ASCVD risk factors include DM and 10-year ASCVD risk score of 1.6%. moderate intensity statin  indicated.  -{Meds adjust:18428} ***statin *** mg.    Written patient instructions provided. Patient verbalized understanding of treatment plan.  Total time in face to face counseling *** minutes.    Follow-up:  Pharmacist ***. PCP clinic visit in October  Maryan Puls, PharmD PGY-1 [facility] Resident

## 2021-12-26 ENCOUNTER — Other Ambulatory Visit (HOSPITAL_COMMUNITY): Payer: Self-pay

## 2021-12-26 ENCOUNTER — Ambulatory Visit: Payer: 59 | Admitting: Pharmacist

## 2021-12-26 MED ORDER — OXYCODONE-ACETAMINOPHEN 10-325 MG PO TABS
1.0000 | ORAL_TABLET | Freq: Every day | ORAL | 0 refills | Status: DC | PRN
  Filled 2021-12-26: qty 20, 3d supply, fill #0
  Filled 2021-12-26: qty 130, 27d supply, fill #0

## 2021-12-26 MED ORDER — PREGABALIN 50 MG PO CAPS
50.0000 mg | ORAL_CAPSULE | Freq: Two times a day (BID) | ORAL | 3 refills | Status: DC
  Filled 2021-12-26 – 2021-12-30 (×3): qty 60, 30d supply, fill #0

## 2021-12-27 ENCOUNTER — Other Ambulatory Visit (HOSPITAL_COMMUNITY): Payer: Self-pay

## 2021-12-27 ENCOUNTER — Other Ambulatory Visit (INDEPENDENT_AMBULATORY_CARE_PROVIDER_SITE_OTHER): Payer: Self-pay | Admitting: Primary Care

## 2021-12-30 ENCOUNTER — Other Ambulatory Visit (HOSPITAL_COMMUNITY): Payer: Self-pay

## 2022-01-03 ENCOUNTER — Other Ambulatory Visit (HOSPITAL_COMMUNITY): Payer: Self-pay

## 2022-01-03 MED ORDER — PREGABALIN 50 MG PO CAPS
50.0000 mg | ORAL_CAPSULE | Freq: Two times a day (BID) | ORAL | 1 refills | Status: DC
  Filled 2022-01-03 – 2023-01-02 (×4): qty 180, 90d supply, fill #0

## 2022-01-03 NOTE — Telephone Encounter (Signed)
Will forward to provider  

## 2022-01-07 NOTE — Progress Notes (Signed)
S:      PCP: Angelica Ortiz   Angelica Ortiz is a 56 y.o. female who presents for weight management and diabetes evaluation, education, and management. PMH is significant for T2DM, depression. asthma, lupus, migraine, obesity, OAB, gastric bypass, and sleep apnea.   Patient was referred and last seen by Primary Care Provider, NP Angelica Ortiz, on 10/17/21. At that visit, no changes were made to current regimen d/t diabetes control with Ozempic. Has been on Ozempic since 05/2021. Of note, she underwent a gastric bypass in December of 2016.    Angelica Ortiz followed up with pharmacy team on 8/17. She reported no GI side effects with her Ozempic. Her dose was increased at that time to 1 mg once a week.   Today, patient arrives in good spirits and presents without any assistance. She presented today with a strong desire to initiate Mounjaro. She states that this was discussed at a previous clinic visit with the pharmacy, however, there was no documentation of this conversation.  She endorsed severe diarrhea symptoms where she has had to rush to the bathroom. She vocalized that she has to worry about going out to eat and how far away it is from her home. She reiterated that she did not want to back down on her Ozempic dose, however.    Family/Social History:  FH: HTN, cancer, DM, CVD Social hx: denies alcohol and tobacco use   Current diabetes medications include: Ozempic 1mg  once a week   Patient reports adherence to taking all medications as prescribed.    Insurance coverage:   Patient denies hypoglycemic events.  She utilizes a BG meter provided from her work. She states it ask her if she ate 2 hours ago and will tell her if the reading is within goal. She stated that the highest number she has seen is 162.   Patient denies nocturia (nighttime urination).  Patient denies neuropathy (nerve pain). Patient denies visual changes. Patient reports self foot exams.    Patient  reported dietary habits:  - Admits to snacking recently, sweets and snack foods throughout the day  -no eating as much lately d/t diarrhea  -mainly protein shakes for lunch    Patient-reported exercise habits:  - R knee pain, back and shoulder pain, has a hx of shoulder surgery - Limited exercise d/t to the above      O:    Recent Labs       Lab Results  Component Value Date    HGBA1C 5.5 07/18/2021      There were no vitals filed for this visit.   Lipid Panel  Labs (Brief)          Component Value Date/Time    CHOL 153 10/17/2021 1043    TRIG 67 10/17/2021 1043    HDL 80 10/17/2021 1043    CHOLHDL 1.9 10/17/2021 1043    LDLCALC 60 10/17/2021 1043        Clinical Atherosclerotic Cardiovascular Disease (ASCVD): No  The 10-year ASCVD risk score (Arnett DK, et al., 2019) is: 1.6%   Values used to calculate the score:     Age: 18 years     Sex: Female     Is Non-Hispanic African American: No     Diabetic: Yes     Tobacco smoker: No     Systolic Blood Pressure: 122 mmHg     Is BP treated: No     HDL Cholesterol: 80 mg/dL     Total  Cholesterol: 153 mg/dL    A/P: Diabetes longstanding currently controlled. Patient is able to verbalize appropriate hypoglycemia management plan. Medication adherence appears appropriate. -Discontinued Ozempic  -Initiate Mounjaro 5 mg once weekly  -Patient educated on purpose, proper use, and potential adverse effects of Mounjaro.  -Extensively discussed pathophysiology of diabetes, recommended lifestyle interventions, dietary effects on blood sugar control.  -Counseled on s/sx of and management of hypoglycemia.  -Next A1c anticipated January.   Written patient instructions provided. Patient verbalized understanding of treatment plan.  Total time in face to face counseling 20 minutes.     Follow-up:  Pharmacist in one month. PCP clinic visit in October   Angelica Ortiz, PharmD PGY-1 [facility] Pharmacy Resident

## 2022-01-09 ENCOUNTER — Ambulatory Visit: Payer: 59 | Attending: Internal Medicine | Admitting: Pharmacist

## 2022-01-09 ENCOUNTER — Other Ambulatory Visit: Payer: Self-pay

## 2022-01-09 DIAGNOSIS — E118 Type 2 diabetes mellitus with unspecified complications: Secondary | ICD-10-CM

## 2022-01-09 DIAGNOSIS — Z6841 Body Mass Index (BMI) 40.0 and over, adult: Secondary | ICD-10-CM

## 2022-01-09 MED ORDER — TIRZEPATIDE 5 MG/0.5ML ~~LOC~~ SOAJ
5.0000 mg | SUBCUTANEOUS | 0 refills | Status: DC
  Filled 2022-01-09: qty 2, 28d supply, fill #0

## 2022-01-09 MED ORDER — OZEMPIC (0.25 OR 0.5 MG/DOSE) 2 MG/3ML ~~LOC~~ SOPN: 0.5000 mg | PEN_INJECTOR | SUBCUTANEOUS | 0 refills | Status: DC

## 2022-01-10 LAB — CMP14+EGFR
ALT: 17 IU/L (ref 0–32)
AST: 18 IU/L (ref 0–40)
Albumin/Globulin Ratio: 1.5 (ref 1.2–2.2)
Albumin: 4.2 g/dL (ref 3.8–4.9)
Alkaline Phosphatase: 107 IU/L (ref 44–121)
BUN/Creatinine Ratio: 20 (ref 9–23)
BUN: 17 mg/dL (ref 6–24)
Bilirubin Total: 0.4 mg/dL (ref 0.0–1.2)
CO2: 24 mmol/L (ref 20–29)
Calcium: 9.5 mg/dL (ref 8.7–10.2)
Chloride: 104 mmol/L (ref 96–106)
Creatinine, Ser: 0.83 mg/dL (ref 0.57–1.00)
Globulin, Total: 2.8 g/dL (ref 1.5–4.5)
Glucose: 107 mg/dL — ABNORMAL HIGH (ref 70–99)
Potassium: 4.4 mmol/L (ref 3.5–5.2)
Sodium: 143 mmol/L (ref 134–144)
Total Protein: 7 g/dL (ref 6.0–8.5)
eGFR: 84 mL/min/{1.73_m2} (ref >59)

## 2022-01-10 LAB — HEMOGLOBIN A1C
Est. average glucose Bld gHb Est-mCnc: 111 mg/dL
Hgb A1c MFr Bld: 5.5 % (ref 4.8–5.6)

## 2022-01-10 LAB — MICROALBUMIN / CREATININE URINE RATIO
Creatinine, Urine: 352.3 mg/dL
Microalb/Creat Ratio: 6 mg/g creat (ref 0–29)
Microalbumin, Urine: 21.6 ug/mL

## 2022-01-16 ENCOUNTER — Other Ambulatory Visit: Payer: Self-pay

## 2022-01-16 MED ORDER — OXYCODONE-ACETAMINOPHEN 10-325 MG PO TABS
1.0000 | ORAL_TABLET | ORAL | 0 refills | Status: DC | PRN
  Filled 2022-01-16 – 2022-01-23 (×2): qty 90, 15d supply, fill #0

## 2022-01-16 MED ORDER — NALOXONE HCL 4 MG/0.1ML NA LIQD
NASAL | 1 refills | Status: DC
  Filled 2022-01-16 (×2): qty 2, 1d supply, fill #0

## 2022-01-17 ENCOUNTER — Other Ambulatory Visit: Payer: Self-pay

## 2022-01-20 ENCOUNTER — Other Ambulatory Visit: Payer: Self-pay

## 2022-01-23 ENCOUNTER — Other Ambulatory Visit: Payer: Self-pay

## 2022-01-23 ENCOUNTER — Encounter (INDEPENDENT_AMBULATORY_CARE_PROVIDER_SITE_OTHER): Payer: 59 | Admitting: Primary Care

## 2022-01-23 ENCOUNTER — Other Ambulatory Visit (HOSPITAL_COMMUNITY): Payer: Self-pay

## 2022-01-23 ENCOUNTER — Encounter (INDEPENDENT_AMBULATORY_CARE_PROVIDER_SITE_OTHER): Payer: Self-pay | Admitting: Primary Care

## 2022-01-23 MED ORDER — DICLOFENAC SODIUM 1 % EX GEL: 2.0000 g | Freq: Every day | CUTANEOUS | 1 refills | Status: DC | PRN

## 2022-01-26 NOTE — Progress Notes (Signed)
Patient was just seen by clinical pharmacist Lurena Joiner medications adjusted and A1c taken appointment not needed we will schedule follow-up 3 months out from last A1c

## 2022-01-27 ENCOUNTER — Telehealth: Payer: Self-pay | Admitting: *Deleted

## 2022-01-27 NOTE — Telephone Encounter (Signed)
Ajovy PA,  Key: [facility] , Your information has been sent to [facility].

## 2022-01-27 NOTE — Telephone Encounter (Signed)
Ajovy Approved today Request Reference Number: [id number]. AJOVY INJ 225/1.5 is approved through 01/28/2023

## 2022-01-29 ENCOUNTER — Other Ambulatory Visit: Payer: Self-pay

## 2022-01-29 MED ORDER — OXYCODONE-ACETAMINOPHEN 10-325 MG PO TABS
1.0000 | ORAL_TABLET | ORAL | 0 refills | Status: DC | PRN
  Filled 2022-02-05: qty 180, 30d supply, fill #0
  Filled 2022-02-07: qty 50, 8d supply, fill #0
  Filled 2022-02-07: qty 130, 22d supply, fill #0

## 2022-02-06 ENCOUNTER — Other Ambulatory Visit (HOSPITAL_COMMUNITY): Payer: Self-pay

## 2022-02-06 ENCOUNTER — Other Ambulatory Visit: Payer: Self-pay

## 2022-02-07 ENCOUNTER — Other Ambulatory Visit: Payer: Self-pay

## 2022-02-07 ENCOUNTER — Other Ambulatory Visit (HOSPITAL_COMMUNITY): Payer: Self-pay

## 2022-02-11 ENCOUNTER — Other Ambulatory Visit: Payer: Self-pay

## 2022-02-11 ENCOUNTER — Other Ambulatory Visit (HOSPITAL_COMMUNITY): Payer: Self-pay

## 2022-02-12 NOTE — Progress Notes (Deleted)
S:     PCP: Juluis Mire   Angelica Ortiz is a 56 y.o. female who presents for weight management and diabetes evaluation, education, and management. PMH is significant for T2DM, depression. asthma, lupus, migraine, obesity, OAB, gastric bypass, and sleep apnea.   Patient was referred and last seen by Primary Care Provider, NP Juluis Mire, on 10/17/21. At that visit, no changes were made to current regimen d/t diabetes control with Ozempic. Has been on Ozempic since 05/2021. Of note, she underwent a gastric bypass in December of 2016.    Angelica Ortiz followed up with pharmacy team on 8/17. She reported no GI side effects with her Ozempic. Her dose was increased at that time to 1 mg once a week.   At last visit with me, patient endorsed severe diarrhea symptoms where she has had to rush to the bathroom. She vocalized that she has to worry about going out to eat and how far away it is from her home. She reiterated that she did not want to back down on her Ozempic dose, however. She presented with a strong desire to initiate Mounjaro.    Today, patient arrives in *** good spirits and presents without *** any assistance. ***  Family/Social History:  FH: HTN, cancer, DM, CVD Social hx: denies alcohol and tobacco use  Current diabetes medications include: Mounjaro 5mg  once weekly  Patient reports adherence to taking all medications as prescribed.  *** Patient denies adherence with medications, reports missing *** medications *** times per week, on average.  Insurance coverage: [employer]  Patient {Actions; denies-reports:120008} hypoglycemic events.  Reported home fasting blood sugars: ***  Reported 2 hour post-meal/random blood sugars: ***.  Patient {Actions; denies-reports:120008} nocturia (nighttime urination).  Patient {Actions; denies-reports:120008} neuropathy (nerve pain). Patient {Actions; denies-reports:120008} visual changes. Patient {Actions; denies-reports:120008}  self foot exams.   Patient reported dietary habits:  - Admits to snacking recently, sweets and snack foods throughout the day  -no eating as much lately d/t diarrhea  -mainly protein shakes for lunch    Patient-reported exercise habits:  - R knee pain, back and shoulder pain, has a hx of shoulder surgery - Limited exercise d/t to the above    O:   ROS  Physical Exam  7 day average blood glucose: ***  *** CGM Download:  % Time CGM is active: ***% Average Glucose: *** mg/dL Glucose Management Indicator: ***  Glucose Variability: *** (goal <36%) Time in Goal:  - Time in range 70-180: ***% - Time above range: ***% - Time below range: ***% Observed patterns:   Lab Results  Component Value Date   HGBA1C 5.5 01/09/2022   There were no vitals filed for this visit.  Lipid Panel     Component Value Date/Time   CHOL 153 10/17/2021 1043   TRIG 67 10/17/2021 1043   HDL 80 10/17/2021 1043   CHOLHDL 1.9 10/17/2021 1043   LDLCALC 60 10/17/2021 1043    Clinical Atherosclerotic Cardiovascular Disease (ASCVD): {YES/NO:21197} The 10-year ASCVD risk score (Arnett DK, et al., 2019) is: 1.4%   Values used to calculate the score:     Age: 84 years     Sex: Female     Is Non-Hispanic African American: No     Diabetic: Yes     Tobacco smoker: No     Systolic Blood Pressure: 99991111 mmHg     Is BP treated: No     HDL Cholesterol: 80 mg/dL     Total Cholesterol:  153 mg/dL   Patient is participating in a Managed Medicaid Plan:  {MM YES/NO:27447::"Yes"}   A/P: Diabetes longstanding *** currently ***. Patient is *** able to verbalize appropriate hypoglycemia management plan. Medication adherence appears ***. Control is suboptimal due to ***. -{Meds adjust:18428} basal insulin *** (insulin ***). Patient will continue to titrate 1 unit every *** days if fasting blood sugar > 100mg /dl until fasting blood sugars reach goal or next visit.  -{Meds adjust:18428} rapid insulin *** (insulin  ***) to ***.  -{Meds adjust:18428} GLP-1 *** (generic ***) to ***.  -{Meds adjust:18428} SGLT2-I *** (generic ***) to ***. Counseled on sick day rules. -{Meds adjust:18428} metformin *** to ***.  -Patient educated on purpose, proper use, and potential adverse effects of ***.  -Extensively discussed pathophysiology of diabetes, recommended lifestyle interventions, dietary effects on blood sugar control.  -Counseled on s/sx of and management of hypoglycemia.  -Next A1c anticipated January.   ASCVD risk - primary ***secondary prevention in patient with diabetes. Last LDL is *** not at goal of February *** mg/dL. ASCVD risk factors include *** and 10-year ASCVD risk score of ***. {Desc; low/moderate/high:110033} intensity statin indicated.  -{Meds adjust:18428} ***statin *** mg.    Written patient instructions provided. Patient verbalized understanding of treatment plan.  Total time in face to face counseling *** minutes.    Follow-up:  Pharmacist ***. PCP clinic visit in January  February, PharmD PGY-1 [facility] Pharmacy Resident

## 2022-02-13 ENCOUNTER — Ambulatory Visit: Payer: 59 | Admitting: Pharmacist

## 2022-02-14 ENCOUNTER — Telehealth (INDEPENDENT_AMBULATORY_CARE_PROVIDER_SITE_OTHER): Payer: Self-pay | Admitting: Primary Care

## 2022-02-14 ENCOUNTER — Other Ambulatory Visit: Payer: Self-pay | Admitting: Family Medicine

## 2022-02-14 ENCOUNTER — Telehealth: Payer: 59

## 2022-02-14 ENCOUNTER — Other Ambulatory Visit: Payer: Self-pay

## 2022-02-14 ENCOUNTER — Telehealth: Payer: 59 | Admitting: Emergency Medicine

## 2022-02-14 DIAGNOSIS — U071 COVID-19: Secondary | ICD-10-CM

## 2022-02-14 MED ORDER — TIRZEPATIDE 7.5 MG/0.5ML ~~LOC~~ SOAJ
7.5000 mg | SUBCUTANEOUS | 0 refills | Status: DC
  Filled 2022-02-14: qty 2, 28d supply, fill #0

## 2022-02-14 NOTE — Telephone Encounter (Signed)
Pt is calling to get an appt sooner than

## 2022-02-14 NOTE — Telephone Encounter (Signed)
Pt is calling to reschedule her appt sooner than Jan 2024 with [facility] Please advise CB [phone]

## 2022-02-14 NOTE — Progress Notes (Signed)
Virtual Visit Consent   Angelica Ortiz, you are scheduled for a virtual visit with a [facility] provider today. Just as with appointments in the office, your consent must be obtained to participate. Your consent will be active for this visit and any virtual visit you may have with one of our providers in the next 365 days. If you have a MyChart account, a copy of this consent can be sent to you electronically.  As this is a virtual visit, video technology does not allow for your provider to perform a traditional examination. This may limit your provider's ability to fully assess your condition. If your provider identifies any concerns that need to be evaluated in person or the need to arrange testing (such as labs, EKG, etc.), we will make arrangements to do so. Although advances in technology are sophisticated, we cannot ensure that it will always work on either your end or our end. If the connection with a video visit is poor, the visit may have to be switched to a telephone visit. With either a video or telephone visit, we are not always able to ensure that we have a secure connection.  By engaging in this virtual visit, you consent to the provision of healthcare and authorize for your insurance to be billed (if applicable) for the services provided during this visit. Depending on your insurance coverage, you may receive a charge related to this service.  I need to obtain your verbal consent now. Are you willing to proceed with your visit today? Angelica Ortiz has provided verbal consent on 02/14/2022 for a virtual visit (video or telephone). [name], NP  Date: 02/14/2022 2:08 PM  Virtual Visit via Video Note   I, [name], connected with  Angelica Ortiz  ([id number], Sep 11, 1965) on 02/14/22 at  2:00 PM EST by a video-enabled telemedicine application and verified that I am speaking with the correct person using two identifiers.  Location: Patient: Virtual Visit Location Patient: Home Provider:  Virtual Visit Location Provider: Home Office   I discussed the limitations of evaluation and management by telemedicine and the availability of in person appointments. The patient expressed understanding and agreed to proceed.    History of Present Illness: Angelica Ortiz is a 56 y.o. who identifies as a female who was assigned female at birth, and is being seen today for COVID.  Patient reports she tested positive for COVID today.  Symptoms started 02/13/2019 3 in the evening with a headache and progressively gotten worse from there.  She feels like she has had a fever, and she has been alternating sweating and having chills.  She has nasal congestion.  She also has significant chest congestion and shortness of breath, but no wheezing.  She does have a history of asthma and has been using her albuterol as well as her Symbicort as prescribed.  She has used her albuterol twice today already.  She is also been taking over-the-counter cold medicines like NyQuil, DayQuil, Mucinex, and Tylenol to manage her symptoms.  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   Post-operative state 09/05/2014  Tubo-ovarian abscess 06/29/2014   Abnormal uterine bleeding (AUB) 03/14/2014   Pelvic abscess in female 03/30/2013   TOA (tubo-ovarian abscess) 03/30/2013   Morbid obesity 03/30/2013   Hypertension, well controlled    Lupus (HCC)    Diverticulitis     Allergies:  Allergies  Allergen Reactions   Dilaudid [Hydromorphone Hcl] Hives and Itching   Iodinated Contrast Media Hives and Itching    Pt given 4 hour prep 06/21/20, c/o  itching after injection- [facility]   Levaquin [Levofloxacin] Anaphylaxis   Tangerine Flavor Swelling    Tangerine fruit: Lip and tongue swelling with tangerines   Ace Inhibitors Swelling   Iodine-131 Itching and Rash   Medications:  Current Outpatient Medications:    pregabalin (LYRICA) 50 MG capsule, Take 1 capsule (50 mg total) by mouth 2 (two) times daily., Disp: 180 capsule, Rfl: 1   albuterol (VENTOLIN HFA) 108 (90 Base) MCG/ACT inhaler, Inhale 2 puffs into the lungs every 6 (six) hours as needed for shortness of breath or wheezing., Disp: 18 g, Rfl: 2   BIOTIN PO, Take 1 tablet by mouth daily., Disp: , Rfl:    brimonidine (ALPHAGAN) 0.2 % ophthalmic solution, 3 (three) times daily., Disp: , Rfl:    CALCIUM PO, Take 1 tablet by mouth daily., Disp: , Rfl:    cyclobenzaprine (FLEXERIL) 10 MG tablet, Take 1 tablet by mouth once daily as needed for severe muscle spasms, Disp: 30 tablet, Rfl: 2   diclofenac Sodium (VOLTAREN) 1 % GEL, Apply 2 g topically daily as needed., Disp: 350 g, Rfl: 1   ELDERBERRY PO, Take 1 tablet by mouth daily., Disp: , Rfl:    fluticasone (FLONASE) 50 MCG/ACT nasal spray, Place 1 spray into both nostrils in the morning., Disp: 18 mL, Rfl: 3   fluticasone-salmeterol (ADVAIR DISKUS) 250-50 MCG/ACT AEPB, Inhale 1 puff into the lungs in the morning and at bedtime., Disp: 60 each, Rfl: 2   Fremanezumab-vfrm (AJOVY) 225 MG/1.5ML SOAJ, Inject 225 mg into the skin every 30 (thirty) days., Disp: 4.5 mL, Rfl: 4   MAGNESIUM PO, Take 1 tablet by mouth daily., Disp: , Rfl:    Multiple Vitamin (MULTIVITAMIN WITH MINERALS) TABS tablet, Take 1 tablet by mouth 2 (two) times daily., Disp: , Rfl:    naloxone (NARCAN) nasal spray 4 mg/0.1 mL, 1 spray every 3 minutes; spray 1 dose into ONE nostril; alternate nostrils w each dose until help arrives, Disp: 2 each, Rfl: 1   NURTEC 75 MG TBDP, Take 75 mg by mouth daily as needed (migraine)., Disp: 48 tablet, Rfl: 4   Oxycodone HCl 10 MG TABS,  SMARTSIG:1 Tablet(s) By Mouth 6 Times Daily PRN, Disp: , Rfl:    oxyCODONE-acetaminophen (PERCOCET) 10-325 MG tablet, Take 1 tablet by mouth up to 5 times a day as needed for severe pain only, Disp: 150 tablet, Rfl: 0   oxyCODONE-acetaminophen (PERCOCET) 10-325 MG tablet, Take 1 tablet by mouth up to 5 (five) times daily as needed for severe pain only., Disp: 150 tablet, Rfl: 0   oxyCODONE-acetaminophen (PERCOCET) 10-325 MG tablet, Take 1 tablet by mouth every 4 (four) to 6 (six) hours as needed for pain., Disp: 90 tablet, Rfl: 0   oxyCODONE-acetaminophen (PERCOCET) 10-325 MG tablet, Take 1 tablet by mouth every 4 (four) to 6 (six) hours as needed for pain., Disp: 180 tablet, Rfl: 0   tirzepatide (MOUNJARO) 7.5 MG/0.5ML Pen, Inject 7.5 mg into the skin once a week., Disp: 2 mL, Rfl: 0   vitamin B-12 (  CYANOCOBALAMIN) 100 MCG tablet, Take 100 mcg by mouth daily., Disp: , Rfl:   Observations/Objective: Patient is well-developed, well-nourished who appears to be in respiratory distress.  Resting  at home.  Head is normocephalic, atraumatic.  Patient sounds breathless by video and can only speak 1 sentence before needing to take a breath. Speech is clear and coherent with logical content.  Patient is alert and oriented at baseline.    Assessment and Plan: 1. COVID-19  Patient is short of breath despite having used her an albuterol inhaler twice today.  I am concerned about her respiratory status given her history of asthma her breathlessness, and positive for COVID.  She agrees to go to the emergency room and her daughter will take her.  Follow Up Instructions: I discussed the assessment and treatment plan with the patient. The patient was provided an opportunity to ask questions and all were answered. The patient agreed with the plan and demonstrated an understanding of the instructions.  A copy of instructions were sent to the patient via MyChart unless otherwise noted below.   The patient was  advised to call back or seek an in-person evaluation if the symptoms worsen or if the condition fails to improve as anticipated.  Time:  I spent 8 minutes with the patient via telehealth technology discussing the above problems/concerns.    [name], NP

## 2022-02-17 ENCOUNTER — Encounter (INDEPENDENT_AMBULATORY_CARE_PROVIDER_SITE_OTHER): Payer: Self-pay | Admitting: Primary Care

## 2022-02-17 ENCOUNTER — Other Ambulatory Visit (INDEPENDENT_AMBULATORY_CARE_PROVIDER_SITE_OTHER): Payer: Self-pay | Admitting: Primary Care

## 2022-02-17 ENCOUNTER — Other Ambulatory Visit: Payer: Self-pay

## 2022-02-17 MED ORDER — MOLNUPIRAVIR EUA 200MG CAPSULE: 4.0000 | ORAL_CAPSULE | Freq: Two times a day (BID) | ORAL | 0 refills | Status: AC

## 2022-02-19 ENCOUNTER — Encounter (INDEPENDENT_AMBULATORY_CARE_PROVIDER_SITE_OTHER): Payer: Self-pay | Admitting: Primary Care

## 2022-02-20 ENCOUNTER — Ambulatory Visit: Payer: Self-pay | Admitting: Pharmacist

## 2022-02-20 ENCOUNTER — Other Ambulatory Visit: Payer: Self-pay

## 2022-02-20 MED ORDER — OXYCODONE-ACETAMINOPHEN 10-325 MG PO TABS
1.0000 | ORAL_TABLET | Freq: Four times a day (QID) | ORAL | 0 refills | Status: DC | PRN
  Filled 2022-02-20: qty 180, 45d supply, fill #0
  Filled 2022-03-05: qty 180, 30d supply, fill #0

## 2022-02-21 ENCOUNTER — Other Ambulatory Visit: Payer: Self-pay

## 2022-02-25 ENCOUNTER — Other Ambulatory Visit (INDEPENDENT_AMBULATORY_CARE_PROVIDER_SITE_OTHER): Payer: Self-pay | Admitting: Primary Care

## 2022-02-25 DIAGNOSIS — J4520 Mild intermittent asthma, uncomplicated: Secondary | ICD-10-CM

## 2022-02-25 NOTE — Telephone Encounter (Signed)
Unable to refill per protocol, Rx request is too soon. Last refill 11/05/21 for 18 mL and 3RF. Will refuse.  Requested Prescriptions  Pending Prescriptions Disp Refills   fluticasone (FLONASE) 50 MCG/ACT nasal spray [Pharmacy Med Name: FLUTICASONE NASAL SP (120) RX] 16 g     Sig: SHAKE LIQUID AND USE 1 SPRAY IN EACH NOSTRIL IN THE MORNING     Ear, Nose, and Throat: Nasal Preparations - Corticosteroids Passed - 02/25/2022 10:13 AM      Passed - Valid encounter within last 12 months    Recent Outpatient Visits           1 month ago Controlled type 2 diabetes mellitus with complication, without long-term current use of insulin (HCC)   [facility] Maceo, Jeannett Senior L, RPH-CPP   3 months ago Controlled type 2 diabetes mellitus with complication, without long-term current use of insulin [facility])    [facility] [place], Cornelius Moras, RPH-CPP   4 months ago Lipid screening   [facility] Grayce Sessions, NP   7 months ago Encounter to establish care   [facility] Grayce Sessions, NP       Future Appointments             In 3 weeks Drucilla Chalet, RPH-CPP [facility]   In 2 months Randa Evens, Kinnie Scales, NP [facility]

## 2022-03-05 ENCOUNTER — Other Ambulatory Visit: Payer: Self-pay

## 2022-03-06 ENCOUNTER — Other Ambulatory Visit: Payer: Self-pay

## 2022-03-24 ENCOUNTER — Ambulatory Visit: Payer: 59 | Attending: Primary Care | Admitting: Pharmacist

## 2022-03-24 ENCOUNTER — Other Ambulatory Visit: Payer: Self-pay

## 2022-03-24 DIAGNOSIS — E118 Type 2 diabetes mellitus with unspecified complications: Secondary | ICD-10-CM

## 2022-03-24 MED ORDER — TIRZEPATIDE 10 MG/0.5ML ~~LOC~~ SOAJ: 10.0000 mg | SUBCUTANEOUS | 0 refills | Status: DC

## 2022-03-24 NOTE — Progress Notes (Signed)
    S:    No chief complaint on file.  Angelica Ortiz is a 56 y.o. female who presents for weight management and diabetes evaluation, education, and management. PMH is significant for T2DM, depression. asthma, lupus, migraine, obesity, OAB, gastric bypass, and sleep apnea. Patient was referred and last seen by Primary Care Provider, NP Gwinda Passe, on 10/17/21. Last seen by pharmacy team on 01/09/2022.   At last visit with pharmacy, patient was having severe diarrhea on Ozempic but was very adament about not wanting to reduce her dose. She was switched from Ozempic to [place].   Today, patient arrives in good spirits and presents without any assistance. Patient endorses she has tolerated Mounjaro better than Ozempic, denies any GI side effects. Current weight 273 lbs (down ~25 lbs). Goal weight is 190 lbs.    Family/Social History:  FH: HTN, cancer, DM, CVD Social hx: denies alcohol and tobacco use  Current diabetes medications include: Mounjaro 7.5 mg weekly (Sunday's)  Patient reports adherence to taking all medications as prescribed.   Insurance coverage: [employer]  Patient denies hypoglycemic events.  Reported home fasting blood sugars: 90s   Reported 2 hour post-meal/random blood sugars: none >120  Patient denies nocturia (nighttime urination).  Patient denies neuropathy (nerve pain). Patient denies visual changes. Patient reports self foot exams.   Patient reported dietary habits: some sugar carvings.  Lunch: [employer] snack tray, yogurt  Drinks: water, seltzers   O:   Lab Results  Component Value Date   HGBA1C 5.5 01/09/2022   There were no vitals filed for this visit.  Lipid Panel     Component Value Date/Time   CHOL 153 10/17/2021 1043   TRIG 67 10/17/2021 1043   HDL 80 10/17/2021 1043   CHOLHDL 1.9 10/17/2021 1043   LDLCALC 60 10/17/2021 1043    Clinical Atherosclerotic Cardiovascular Disease (ASCVD): No  The 10-year ASCVD risk score (Arnett DK, et al., 2019)  is: 1.4%   Values used to calculate the score:     Age: 73 years     Sex: Female     Is Non-Hispanic African American: No     Diabetic: Yes     Tobacco smoker: No     Systolic Blood Pressure: 114 mmHg     Is BP treated: No     HDL Cholesterol: 80 mg/dL     Total Cholesterol: 153 mg/dL   A/P: Diabetes longstanding currently at goal based on A1c (5.5%). Patient is able to verbalize appropriate hypoglycemia management plan. Medication adherence appears appropriate.  -Increase dose of Mounjaro to 10 mg weekly.  -Patient educated on purpose, proper use, and potential adverse effects of Mounjaro.  -Extensively discussed pathophysiology of diabetes, recommended lifestyle interventions, dietary effects on blood sugar control.  -Counseled on s/sx of and management of hypoglycemia.  -Next A1c anticipated in April 2024.   Written patient instructions provided. Patient verbalized understanding of treatment plan.  Total time in face to face counseling 20 minutes.    Follow-up:  Pharmacist PRN. PCP clinic visit on 05/01/2022.   Valeda Malm, Pharm.D. PGY-2 [facility] Resident 03/24/2022 3:17 PM

## 2022-03-27 ENCOUNTER — Other Ambulatory Visit: Payer: Self-pay

## 2022-03-27 MED ORDER — OXYCODONE-ACETAMINOPHEN 10-325 MG PO TABS
1.0000 | ORAL_TABLET | ORAL | 0 refills | Status: DC | PRN
  Filled 2022-04-03 – 2022-04-04 (×2): qty 180, 30d supply, fill #0

## 2022-04-02 ENCOUNTER — Other Ambulatory Visit (INDEPENDENT_AMBULATORY_CARE_PROVIDER_SITE_OTHER): Payer: Self-pay | Admitting: Primary Care

## 2022-04-02 DIAGNOSIS — J4520 Mild intermittent asthma, uncomplicated: Secondary | ICD-10-CM

## 2022-04-03 ENCOUNTER — Other Ambulatory Visit: Payer: Self-pay

## 2022-04-04 ENCOUNTER — Other Ambulatory Visit: Payer: Self-pay

## 2022-04-18 ENCOUNTER — Other Ambulatory Visit: Payer: Self-pay | Admitting: Family Medicine

## 2022-04-18 DIAGNOSIS — E118 Type 2 diabetes mellitus with unspecified complications: Secondary | ICD-10-CM

## 2022-04-22 ENCOUNTER — Other Ambulatory Visit: Payer: Self-pay

## 2022-05-01 ENCOUNTER — Encounter (INDEPENDENT_AMBULATORY_CARE_PROVIDER_SITE_OTHER): Payer: Self-pay | Admitting: Primary Care

## 2022-05-01 ENCOUNTER — Other Ambulatory Visit: Payer: Self-pay

## 2022-05-01 ENCOUNTER — Ambulatory Visit (INDEPENDENT_AMBULATORY_CARE_PROVIDER_SITE_OTHER): Payer: 59 | Admitting: Primary Care

## 2022-05-01 VITALS — BP 100/69 | HR 65 | Resp 16 | Ht 66.0 in | Wt 275.4 lb

## 2022-05-01 DIAGNOSIS — E118 Type 2 diabetes mellitus with unspecified complications: Secondary | ICD-10-CM | POA: Diagnosis not present

## 2022-05-01 DIAGNOSIS — M5030 Other cervical disc degeneration, unspecified cervical region: Secondary | ICD-10-CM | POA: Diagnosis not present

## 2022-05-01 DIAGNOSIS — I10 Essential (primary) hypertension: Secondary | ICD-10-CM | POA: Diagnosis not present

## 2022-05-01 DIAGNOSIS — E119 Type 2 diabetes mellitus without complications: Secondary | ICD-10-CM | POA: Diagnosis not present

## 2022-05-01 DIAGNOSIS — E039 Hypothyroidism, unspecified: Secondary | ICD-10-CM | POA: Diagnosis not present

## 2022-05-01 DIAGNOSIS — Z23 Encounter for immunization: Secondary | ICD-10-CM | POA: Diagnosis not present

## 2022-05-01 DIAGNOSIS — Z79899 Other long term (current) drug therapy: Secondary | ICD-10-CM | POA: Diagnosis not present

## 2022-05-01 DIAGNOSIS — E538 Deficiency of other specified B group vitamins: Secondary | ICD-10-CM | POA: Diagnosis not present

## 2022-05-01 DIAGNOSIS — Z6841 Body Mass Index (BMI) 40.0 and over, adult: Secondary | ICD-10-CM

## 2022-05-01 DIAGNOSIS — M797 Fibromyalgia: Secondary | ICD-10-CM | POA: Diagnosis not present

## 2022-05-01 DIAGNOSIS — M6530 Trigger finger, unspecified finger: Secondary | ICD-10-CM

## 2022-05-01 MED ORDER — TIRZEPATIDE 12.5 MG/0.5ML ~~LOC~~ SOAJ: 12.5000 mg | SUBCUTANEOUS | 6 refills | Status: DC

## 2022-05-01 MED ORDER — OXYCODONE-ACETAMINOPHEN 10-325 MG PO TABS
1.0000 | ORAL_TABLET | ORAL | 0 refills | Status: DC | PRN
  Filled 2022-05-02: qty 180, 30d supply, fill #0

## 2022-05-01 NOTE — Progress Notes (Signed)
[name], is a 57 y.o. female  QVZ:[id number]  [id number]  DOB - 01-16-1966  Chief Complaint  Patient presents with   Hand Pain    Right ring finger will not bend Was told she has trigger finger  Stays swollen  Anytime she touches anything hot or cold hands turns blue and it's an come and go issues    Diabetes       Subjective:   Angelica Ortiz is a 57 y.o. female here today for a follow up visit for weight management increasing medication and right hand has a trigger finger see picture. She does mention stressed and depressed overwhelm with children and fiancee.  Introduce her to [place]. Patient has No headache, No chest pain, No abdominal pain - No Nausea, No new weakness tingling or numbness, No Cough - shortness of breath  No problems updated.  Allergies  Allergen Reactions   Dilaudid [Hydromorphone Hcl] Hives and Itching   Iodinated Contrast Media Hives and Itching    Pt given 4 hour prep 06/21/20, c/o itching after injection- [facility]   Levaquin [Levofloxacin] Anaphylaxis   Tangerine Flavor Swelling    Tangerine fruit: Lip and tongue swelling with tangerines   Ace Inhibitors Swelling   Iodine-131 Itching and Rash    Past Medical History:  Diagnosis Date   Anemia    Arthritis    Asthma    Depression    Diabetes mellitus without complication (HCC)    Type 2   Diverticulitis    Falls    Fibromyalgia    Gait instability    GERD (gastroesophageal reflux disease)    History of urinary tract infection    Hypertension    Lupus (HCC)    no meds   Migraine    Morbid obesity 03/30/2013   Overactive bladder    Overactive bladder    Pulmonary embolism (HCC)    Resolved -At age 57 broken right leg, blood clot broke off - into lung   Seasonal allergies    Sleep apnea 06/05/2013   Does not have CPAP machine   Urinary incontinence     Current Outpatient Medications on File Prior to Visit  Medication Sig Dispense Refill   albuterol (VENTOLIN  HFA) 108 (90 Base) MCG/ACT inhaler Inhale 2 puffs into the lungs every 6 (six) hours as needed for shortness of breath or wheezing. 18 g 2   BIOTIN PO Take 1 tablet by mouth daily.     brimonidine (ALPHAGAN) 0.2 % ophthalmic solution 3 (three) times daily.     budesonide-formoterol (SYMBICORT) 160-4.5 MCG/ACT inhaler INHALE 2 PUFFS INTO THE LUNGS DAILY 10.2 g 3   CALCIUM PO Take 1 tablet by mouth daily.     cyclobenzaprine (FLEXERIL) 10 MG tablet Take 1 tablet by mouth once daily as needed for severe muscle spasms 30 tablet 2   diclofenac Sodium (VOLTAREN) 1 % GEL Apply 2 g topically daily as needed. 350 g 1   ELDERBERRY PO Take 1 tablet by mouth daily.     fluticasone (FLONASE) 50 MCG/ACT nasal spray Place 1 spray into both nostrils in the morning. 18 mL 3   Fremanezumab-vfrm (AJOVY) 225 MG/1.5ML SOAJ Inject 225 mg into the skin every 30 (thirty) days. 4.5 mL 4   levothyroxine (SYNTHROID) 150 MCG tablet 1 tablet in the morning on an empty stomach Orally Once a day for 30 day(s)     lidocaine (LIDODERM) 5 % 1 patch daily.     MAGNESIUM PO  Take 1 tablet by mouth daily.     Multiple Vitamin (MULTIVITAMIN WITH MINERALS) TABS tablet Take 1 tablet by mouth 2 (two) times daily.     naloxone (NARCAN) nasal spray 4 mg/0.1 mL 1 spray every 3 minutes; spray 1 dose into ONE nostril; alternate nostrils w each dose until help arrives 2 each 1   NURTEC 75 MG TBDP Take 75 mg by mouth daily as needed (migraine). 48 tablet 4   Oxycodone HCl 10 MG TABS SMARTSIG:1 Tablet(s) By Mouth 6 Times Daily PRN     oxyCODONE-acetaminophen (PERCOCET) 10-325 MG tablet Take 1 tablet by mouth up to 5 times a day as needed for severe pain only 150 tablet 0   oxyCODONE-acetaminophen (PERCOCET) 10-325 MG tablet Take 1 tablet by mouth up to 5 (five) times daily as needed for severe pain only. 150 tablet 0   oxyCODONE-acetaminophen (PERCOCET) 10-325 MG tablet Take 1 tablet by mouth every 4 (four) to 6 (six) hours as needed for pain. 90  tablet 0   pregabalin (LYRICA) 50 MG capsule Take 1 capsule (50 mg total) by mouth 2 (two) times daily. 180 capsule 1   vitamin B-12 (CYANOCOBALAMIN) 100 MCG tablet Take 100 mcg by mouth daily.     No current facility-administered medications on file prior to visit.    Objective:   Vitals:   05/01/22 0900  BP: 100/69  Pulse: 65  Resp: 16  SpO2: 95%  Weight: 275 lb 6.4 oz (124.9 kg)  Height: 5\' 6"  (1.676 m)    Exam General appearance : Awake, alert, not in any distress. Speech Clear. Not toxic looking HEENT: Atraumatic and Normocephalic, pupils equally reactive to light and accomodation Neck: Supple, no JVD. No cervical lymphadenopathy.  Chest: Good air entry bilaterally, no added sounds  CVS: S1 S2 regular, no murmurs.  Abdomen: Bowel sounds present, Non tender and not distended with no gaurding, rigidity or rebound. Extremities: B/L Lower Ext shows no edema, both legs are warm to touch Neurology: Awake alert, and oriented X 3, CN II-XII intact, Non focal Skin: No Rash  Data Review Lab Results  Component Value Date   HGBA1C 5.5 01/09/2022   HGBA1C 5.5 07/18/2021   HGBA1C 5.8 (H) 10/18/2020    Assessment & Plan  [name] was seen today for hand pain and diabetes.  Diagnoses and all orders for this visit:  Need for immunization against influenza -     Flu Vaccine QUAD 63mo+IM (Fluarix, Fluzone & Alfiuria Quad PF)  Class 3 severe obesity due to excess calories with serious comorbidity and body mass index (BMI) of 45.0 to 49.9 in adult (HCC)  Weight has been stable on previous dose.Increased  tirzepatide (MOUNJARO) 12.5 MG/0.5ML Pen; Inject 12.5 mg into the skin once a week.  Controlled type 2 diabetes mellitus with complication, without long-term current use of insulin (HCC) Well controlled  -     Lipid panel -     CBC with Differential/Platelet -     Ambulatory referral to Optometry  Hypertension, well controlled Diet and exercise   B12 deficiency Hx of gastric  bypass -     Vitamin B12   Hypothyroidism, unspecified type -     TSH + free T4  Trigger finger, acquired  -     AMB referral to orthopedics  Other orders -     tirzepatide (MOUNJARO) 12.5 MG/0.5ML Pen; Inject 12.5 mg into the skin once a week.  Patient have been counseled extensively about nutrition and exercise. Other  issues discussed during this visit include: low cholesterol diet, weight control and daily exercise, foot care, annual eye examinations at Ophthalmology, importance of adherence with medications and regular follow-up. We also discussed long term complications of uncontrolled diabetes and hypertension.   Return in about 3 months (around 07/31/2022).  The patient was given clear instructions to go to ER or return to medical center if symptoms don't improve, worsen or new problems develop. The patient verbalized understanding. The patient was told to call to get lab results if they haven't heard anything in the next week.   This note has been created with Surveyor, quantity. Any transcriptional errors are unintentional.   Kerin Perna, NP 05/01/2022, 1:37 PM

## 2022-05-02 ENCOUNTER — Other Ambulatory Visit: Payer: Self-pay

## 2022-05-02 LAB — LIPID PANEL
Chol/HDL Ratio: 2.3 ratio (ref 0.0–4.4)
Cholesterol, Total: 145 mg/dL (ref 100–199)
HDL: 64 mg/dL (ref >39)
LDL Chol Calc (NIH): 67 mg/dL (ref 0–99)
Triglycerides: 72 mg/dL (ref 0–149)
VLDL Cholesterol Cal: 14 mg/dL (ref 5–40)

## 2022-05-02 LAB — CBC WITH DIFFERENTIAL/PLATELET
Basophils Absolute: 0 10*3/uL (ref 0.0–0.2)
Basos: 0 %
EOS (ABSOLUTE): 0.1 10*3/uL (ref 0.0–0.4)
Eos: 2 %
Hematocrit: 39.2 % (ref 34.0–46.6)
Hemoglobin: 12.9 g/dL (ref 11.1–15.9)
Immature Grans (Abs): 0 10*3/uL (ref 0.0–0.1)
Immature Granulocytes: 0 %
Lymphocytes Absolute: 1.8 10*3/uL (ref 0.7–3.1)
Lymphs: 44 %
MCH: 28.4 pg (ref 26.6–33.0)
MCHC: 32.9 g/dL (ref 31.5–35.7)
MCV: 86 fL (ref 79–97)
Monocytes Absolute: 0.4 10*3/uL (ref 0.1–0.9)
Monocytes: 10 %
Neutrophils Absolute: 1.8 10*3/uL (ref 1.4–7.0)
Neutrophils: 44 %
Platelets: 206 10*3/uL (ref 150–450)
RBC: 4.55 x10E6/uL (ref 3.77–5.28)
RDW: 13.5 % (ref 11.7–15.4)
WBC: 4.2 10*3/uL (ref 3.4–10.8)

## 2022-05-02 LAB — TSH+FREE T4
Free T4: 1.27 ng/dL (ref 0.82–1.77)
TSH: 1.82 u[IU]/mL (ref 0.450–4.500)

## 2022-05-02 LAB — VITAMIN B12: Vitamin B-12: >2000 pg/mL — ABNORMAL HIGH (ref 232–1245)

## 2022-05-05 DIAGNOSIS — Z79899 Other long term (current) drug therapy: Secondary | ICD-10-CM | POA: Diagnosis not present

## 2022-05-14 ENCOUNTER — Other Ambulatory Visit (HOSPITAL_COMMUNITY): Payer: Self-pay

## 2022-05-14 ENCOUNTER — Other Ambulatory Visit (HOSPITAL_BASED_OUTPATIENT_CLINIC_OR_DEPARTMENT_OTHER): Payer: Self-pay

## 2022-05-14 ENCOUNTER — Other Ambulatory Visit: Payer: Self-pay

## 2022-05-15 ENCOUNTER — Telehealth: Payer: Self-pay | Admitting: Pharmacy Technician

## 2022-05-15 DIAGNOSIS — M65341 Trigger finger, right ring finger: Secondary | ICD-10-CM | POA: Diagnosis not present

## 2022-05-15 NOTE — Telephone Encounter (Signed)
Patient Advocate Encounter   Received notification that prior authorization for AJOVY (fremanezumab-vfrm) injection 225MG /1.5ML auto-injectors is required.   PA submitted on 05/15/2022 Key [id number] Status is pending       Lyndel Safe, CPhT Pharmacy Patient Advocate Specialist [place] Patient Advocate Team Direct Number: [phone]  Fax: [phone]

## 2022-05-15 NOTE — Telephone Encounter (Signed)
Patient Advocate Encounter  Received notification that the request for prior authorization for AJOVY (fremanezumab-vfrm) injection 225MG /1.5ML auto-injectors has been denied due to must try Qulipta before they will approve.     Lyndel Safe, [place] Patient Advocate Specialist [place] Patient Advocate Team Direct Number: [phone]  Fax: [phone]

## 2022-05-16 ENCOUNTER — Encounter (INDEPENDENT_AMBULATORY_CARE_PROVIDER_SITE_OTHER): Payer: Self-pay | Admitting: Primary Care

## 2022-05-20 ENCOUNTER — Other Ambulatory Visit (INDEPENDENT_AMBULATORY_CARE_PROVIDER_SITE_OTHER): Payer: Self-pay | Admitting: Primary Care

## 2022-05-20 NOTE — Telephone Encounter (Signed)
Medication Refill - Medication: Moounjaro 10 mg P asking for this refill until 73m comes in  Has the patient contacted their pharmacy? yes (Agent: If no, request that the patient contact the pharmacy for the refill. If patient does not wish to contact the pharmacy document the reason why and proceed with request.) (Agent: If yes, when and what did the pharmacy advise?)contact pcp  Preferred Pharm[facility] (with phone number or street name):  [facility] [address] [address]: [phone] Fax: [phone]    Has the patient been seen for an appointment in the last year OR does the patient have an upcoming appointment? yes  Agent: Please be advised that RX refills may take up to 3 business days. We ask that you follow-up with your pharmacy.

## 2022-05-20 NOTE — Telephone Encounter (Signed)
I called pt and relayed per her insurance that they did not approve ajovy and want her to try qulipta first.  She is willing to do this.  I explained SE to her as listed below per Dr. Jaynee Eagles.  She will look up on line about the drug.  Will start the process.  She appreciated call and did expressed frustration with insurance.

## 2022-05-20 NOTE — Telephone Encounter (Signed)
Would you please call patient and explain she has to try qulipta first would that be ok per insurance? If it doesn;t work or she doesn;t like it we can go back to the injections but qulipta is a great medicine. Side effects: 4% of people have some GI problems bloating/constipation otherwise few side effects

## 2022-05-20 NOTE — Addendum Note (Signed)
Addended by: Brandon Melnick on: 05/20/2022 01:52 PM   Modules accepted: Orders

## 2022-05-21 ENCOUNTER — Other Ambulatory Visit: Payer: Self-pay | Admitting: Neurology

## 2022-05-21 MED ORDER — QULIPTA 60 MG PO TABS: 60.0000 mg | ORAL_TABLET | Freq: Every day | ORAL | 11 refills | Status: DC

## 2022-05-21 NOTE — Addendum Note (Signed)
Addended by: Sarina Ill B on: 05/21/2022 05:18 PM   Modules accepted: Orders

## 2022-05-21 NOTE — Telephone Encounter (Signed)
Will forward to provider  

## 2022-05-21 NOTE — Telephone Encounter (Signed)
Requested medication (s) are due for refill today - no  Requested medication (s) are on the active medication list -yes  Future visit scheduled -yes  Last refill: 05/01/22 69m 6RF  Notes to clinic: Patient requesting 10 mg dosing until 12 mg dosing is available -medication not assigned protocol- provider review    Requested Prescriptions  Pending Prescriptions Disp Refills   tirzepatide (MOUNJARO) 12.5 MG/0.5ML Pen 6 mL 6    Sig: Inject 12.5 mg into the skin once a week.     Off-Protocol Failed - 05/20/2022  2:34 PM      Failed - Medication not assigned to a protocol, review manually.      Passed - Valid encounter within last 12 months    Recent Outpatient Visits           2 weeks ago Need for immunization against influenza   [place] Michelle P, NP   1 month ago Controlled type 2 diabetes mellitus with complication, without long-term current use of insulin ([facility]   [place], RPH-CPP   4 months ago Controlled type 2 diabetes mellitus with complication, without long-term current use of insulin ([facility]   [place] SD[place]er[place], RPH-CPP   6 months ago Controlled type 2 diabetes mellitus with complication, without long-term current use of insulin ([facility]   [place] SAnnie MainL, RPH-CPP   7 months ago Lipid screening   [place]Kerin Perna NP       Future Appointments             In 2 months EKerin Perna NP  [facility]               Requested Prescriptions  Pending Prescriptions Disp Refills   tirzepatide (MOUNJARO) 12.5 MG/0.5ML Pen 6 mL 6    Sig: Inject 12.5 mg into the skin once a week.     Off-Protocol Failed - 05/20/2022  2:34 PM      Failed - Medication not assigned to a protocol, review manually.       Passed - Valid encounter within last 12 months    Recent Outpatient Visits           2 weeks ago Need for immunization against influenza   [place] Michelle P, NP   1 month ago Controlled type 2 diabetes mellitus with complication, without long-term current use of insulin ([facility]   [place], RPH-CPP   4 months ago Controlled type 2 diabetes mellitus with complication, without long-term current use of insulin ([facility]   [place] SAnnie MainL, RPH-CPP   6 months ago Controlled type 2 diabetes mellitus with complication, without long-term current use of insulin ([facility]   [place] SJarome Matin RPH-CPP   7 months ago Lipid screening   [place] NP       Future Appointments             In 2 months EOletta Lamas MMilford Cage NP [place]

## 2022-05-23 MED ORDER — TIRZEPATIDE 12.5 MG/0.5ML ~~LOC~~ SOAJ: 10.0000 mg | SUBCUTANEOUS | 6 refills | Status: DC

## 2022-05-27 ENCOUNTER — Telehealth: Payer: Self-pay | Admitting: *Deleted

## 2022-05-27 NOTE — Telephone Encounter (Signed)
Need Qulipta PA asap please, thanks!

## 2022-05-29 ENCOUNTER — Other Ambulatory Visit: Payer: Self-pay

## 2022-05-29 DIAGNOSIS — M5137 Other intervertebral disc degeneration, lumbosacral region: Secondary | ICD-10-CM | POA: Diagnosis not present

## 2022-05-29 DIAGNOSIS — Z79899 Other long term (current) drug therapy: Secondary | ICD-10-CM | POA: Diagnosis not present

## 2022-05-29 DIAGNOSIS — M5030 Other cervical disc degeneration, unspecified cervical region: Secondary | ICD-10-CM | POA: Diagnosis not present

## 2022-05-29 DIAGNOSIS — M65341 Trigger finger, right ring finger: Secondary | ICD-10-CM | POA: Diagnosis not present

## 2022-05-29 DIAGNOSIS — E119 Type 2 diabetes mellitus without complications: Secondary | ICD-10-CM | POA: Diagnosis not present

## 2022-05-29 MED ORDER — PHENTERMINE HCL 15 MG PO CAPS
15.0000 mg | ORAL_CAPSULE | Freq: Every day | ORAL | 0 refills | Status: DC
  Filled 2022-05-29: qty 30, 30d supply, fill #0

## 2022-05-29 MED ORDER — OXYCODONE-ACETAMINOPHEN 10-325 MG PO TABS
1.0000 | ORAL_TABLET | ORAL | 0 refills | Status: DC | PRN
  Filled 2022-05-30: qty 180, 30d supply, fill #0

## 2022-05-29 NOTE — Telephone Encounter (Signed)
Patient Advocate Encounter  Received notification that prior authorization for Qulipta 60MG tablets is required.   PA submitted on 05/29/2022 Key [id number] Status is pending       Lyndel Safe, [place] Patient Advocate Specialist [place] Patient Advocate Team Direct Number: [phone]  Fax: [phone]

## 2022-05-29 NOTE — Telephone Encounter (Signed)
Patient Advocate Encounter  Prior Authorization for Qulipta 60MG tablets has been approved.    PA# [id number] Effective dates: 05/29/2022 through 11/27/2022      Lyndel Safe, [place] Patient Advocate Specialist [place] Patient Advocate Team Direct Number: [phone]  Fax: [phone]

## 2022-05-30 ENCOUNTER — Other Ambulatory Visit: Payer: Self-pay

## 2022-05-30 ENCOUNTER — Ambulatory Visit (INDEPENDENT_AMBULATORY_CARE_PROVIDER_SITE_OTHER): Payer: Self-pay | Admitting: *Deleted

## 2022-05-30 NOTE — Telephone Encounter (Signed)
  Chief Complaint: requesting new order written for mounjaro to pharmacy due to 12.5 mg out of stock  Symptoms: out of medication x 3 weeks, no sx reported but blood glucose is getting higher than normal Frequency: 3 weeks  Pertinent Negatives: Patient denies na Disposition: []$ ED /[]$ Urgent Care (no appt availability in office) / []$ Appointment(In office/virtual)/ []$  [place] [facility]/ []$ Home Care/ []$ Refused Recommended Disposition /[]$ [place] [facility]/ [x]$  Follow-up with PCP Additional Notes:   Please advise if new order for mounjaro 10 mg pen can be given with refills. Recommended to monitor blood sugar and call back with sx or elevated BS. Go to UC/ED over weekend if needed.     Reason for Disposition  [1] Caller has URGENT medicine question about med that PCP or specialist prescribed AND [2] triager unable to answer question  Answer Assessment - Initial Assessment Questions 1. NAME of MEDICINE: "What medicine(s) are you calling about?"     Mounjaro 12.40m/0.5ml pen 2. QUESTION: "What is your question?" (e.g., double dose of medicine, side effect)     Medication 12.5 mg out of stock. Patient needs new order for mounjaro 115mpen  3. PRESCRIBER: "Who prescribed the medicine?" Reason: if prescribed by specialist, call should be referred to that group.     PCP 4. SYMPTOMS: "Do you have any symptoms?" If Yes, ask: "What symptoms are you having?"  "How bad are the symptoms (e.g., mild, moderate, severe)     Has not taken medication x 3 weeks and blood glucose is getter higher than normal 5. PREGNANCY:  "Is there any chance that you are pregnant?" "When was your last menstrual period?"     na  Protocols used: Medication Question Call-A-AH

## 2022-05-30 NOTE — Telephone Encounter (Signed)
Medication Refill - Medication:  tirzepatide Darcel Bayley) 10 MG/0.5ML Pen   Has the patient contacted their pharmacy? Yes.   Patient is supposed to start the 12.5 but they are out of stock.  Preferred Pharmacy (with phone number or street name):  [place] [place] [facility] [place] [address] [place] [address] Phone: [phone]  Fax: [phone]   Has the patient been seen for an appointment in the last year OR does the patient have an upcoming appointment? Yes.    Agent: Please be advised that RX refills may take up to 3 business days. We ask that you follow-up with your pharmacy.

## 2022-06-01 ENCOUNTER — Other Ambulatory Visit (INDEPENDENT_AMBULATORY_CARE_PROVIDER_SITE_OTHER): Payer: Self-pay | Admitting: Primary Care

## 2022-06-01 MED ORDER — TIRZEPATIDE 10 MG/0.5ML ~~LOC~~ SOAJ: 10.0000 mg | SUBCUTANEOUS | 6 refills | Status: DC

## 2022-06-01 NOTE — Telephone Encounter (Signed)
Medication sent mounjaro '10mg'$  weekly

## 2022-06-02 DIAGNOSIS — Z79899 Other long term (current) drug therapy: Secondary | ICD-10-CM | POA: Diagnosis not present

## 2022-06-02 DIAGNOSIS — M79644 Pain in right finger(s): Secondary | ICD-10-CM | POA: Diagnosis not present

## 2022-06-06 ENCOUNTER — Telehealth (INDEPENDENT_AMBULATORY_CARE_PROVIDER_SITE_OTHER): Payer: Self-pay | Admitting: Primary Care

## 2022-06-06 ENCOUNTER — Ambulatory Visit (INDEPENDENT_AMBULATORY_CARE_PROVIDER_SITE_OTHER): Payer: Self-pay

## 2022-06-06 NOTE — Telephone Encounter (Signed)
[facility] called, spoke with Angelica Ortiz, Angelica Ortiz who advised that they have no medications on file for pt. Reached out to agent who placed clinical to gather more information. Pt doesn't have [facility] on file for pharmacy and reviewed and looks like the last Angelica Ortiz was sent to [facility].   Angelica Ortiz was able to reach out to Angelica Ortiz with [facility], and transfer the call. He states that there is [facility] the Mounjaro at 2 different doses, reviewed chart and seen only [place] on file but reviewed notes and understood that pt was increased to 12.'5mg'$  on 05/01/22 but d/t no pharmacies having in stock pt had requested '10mg'$  back. '10mg'$  was sent to [facility] on 06/01/22 #75m/6 RF. He states he was going to call other [facility] to let them know where pt is filling and see if they have in stock and if not already filled then will have them reverse insurance purposes.   Summary: Medication Management Advice   Angelica Ortiz calling from [facility] is calling to report that the patient is on 2 strengths of monjaro needing to know what the correct strength should be. On file tirzepatide ([facility] 10 MG/0.5ML Pen [[id number] mounjaro 12.5. (written date 05/23/22) Both strengths written by Angelica Ortiz If reach VM please leave message on the correct dosage. Please advise         Reason for Disposition . Pharmacy calling with prescription question and triager answers question  Answer Assessment - Initial Assessment Questions 1. NAME of MEDICINE: "What medicine(s) are you calling about?"     Mounjaro  2. QUESTION: "What is your question?" (e.g., double dose of medicine, side effect)     Having different doses on file  3. PRESCRIBER: "Who prescribed the medicine?" Reason: if prescribed by specialist, call should be referred to that group.     Angelica Lull NP  Protocols used: Medication Question Call-A-AH

## 2022-06-16 DIAGNOSIS — M79644 Pain in right finger(s): Secondary | ICD-10-CM | POA: Diagnosis not present

## 2022-06-19 DIAGNOSIS — M79644 Pain in right finger(s): Secondary | ICD-10-CM | POA: Diagnosis not present

## 2022-06-24 DIAGNOSIS — M79644 Pain in right finger(s): Secondary | ICD-10-CM | POA: Diagnosis not present

## 2022-06-25 DIAGNOSIS — M79644 Pain in right finger(s): Secondary | ICD-10-CM | POA: Diagnosis not present

## 2022-06-26 ENCOUNTER — Other Ambulatory Visit: Payer: Self-pay

## 2022-06-26 DIAGNOSIS — M5030 Other cervical disc degeneration, unspecified cervical region: Secondary | ICD-10-CM | POA: Diagnosis not present

## 2022-06-26 DIAGNOSIS — E119 Type 2 diabetes mellitus without complications: Secondary | ICD-10-CM | POA: Diagnosis not present

## 2022-06-26 DIAGNOSIS — M5137 Other intervertebral disc degeneration, lumbosacral region: Secondary | ICD-10-CM | POA: Diagnosis not present

## 2022-06-26 DIAGNOSIS — Z79899 Other long term (current) drug therapy: Secondary | ICD-10-CM | POA: Diagnosis not present

## 2022-06-26 MED ORDER — PHENTERMINE HCL 30 MG PO CAPS
30.0000 mg | ORAL_CAPSULE | Freq: Every day | ORAL | 0 refills | Status: DC
  Filled 2022-06-26 – 2022-06-27 (×2): qty 30, 30d supply, fill #0

## 2022-06-26 MED ORDER — OXYCODONE-ACETAMINOPHEN 10-325 MG PO TABS
1.0000 | ORAL_TABLET | ORAL | 0 refills | Status: DC | PRN
  Filled 2022-06-26 – 2022-06-27 (×2): qty 180, 30d supply, fill #0

## 2022-06-27 ENCOUNTER — Other Ambulatory Visit: Payer: Self-pay

## 2022-06-30 DIAGNOSIS — Z79899 Other long term (current) drug therapy: Secondary | ICD-10-CM | POA: Diagnosis not present

## 2022-07-01 DIAGNOSIS — M79644 Pain in right finger(s): Secondary | ICD-10-CM | POA: Diagnosis not present

## 2022-07-01 DIAGNOSIS — M25649 Stiffness of unspecified hand, not elsewhere classified: Secondary | ICD-10-CM | POA: Diagnosis not present

## 2022-07-03 DIAGNOSIS — M79644 Pain in right finger(s): Secondary | ICD-10-CM | POA: Diagnosis not present

## 2022-07-03 DIAGNOSIS — M25649 Stiffness of unspecified hand, not elsewhere classified: Secondary | ICD-10-CM | POA: Diagnosis not present

## 2022-07-07 ENCOUNTER — Other Ambulatory Visit: Payer: Self-pay | Admitting: *Deleted

## 2022-07-07 MED ORDER — QULIPTA 60 MG PO TABS: 60.0000 mg | ORAL_TABLET | Freq: Every day | ORAL | 0 refills | Status: DC

## 2022-07-08 ENCOUNTER — Other Ambulatory Visit (INDEPENDENT_AMBULATORY_CARE_PROVIDER_SITE_OTHER): Payer: Self-pay

## 2022-07-08 DIAGNOSIS — J4520 Mild intermittent asthma, uncomplicated: Secondary | ICD-10-CM

## 2022-07-08 DIAGNOSIS — M79644 Pain in right finger(s): Secondary | ICD-10-CM | POA: Diagnosis not present

## 2022-07-08 MED ORDER — ALBUTEROL SULFATE HFA 108 (90 BASE) MCG/ACT IN AERS: 2.0000 | INHALATION_SPRAY | Freq: Four times a day (QID) | RESPIRATORY_TRACT | 2 refills | Status: DC | PRN

## 2022-07-08 MED ORDER — TIRZEPATIDE 10 MG/0.5ML ~~LOC~~ SOAJ: 10.0000 mg | SUBCUTANEOUS | 6 refills | Status: DC

## 2022-07-08 MED ORDER — DICLOFENAC SODIUM 1 % EX GEL: 2.0000 g | Freq: Every day | CUTANEOUS | 1 refills | Status: DC | PRN

## 2022-07-08 MED ORDER — BUDESONIDE-FORMOTEROL FUMARATE 160-4.5 MCG/ACT IN AERO: 2.0000 | INHALATION_SPRAY | Freq: Every day | RESPIRATORY_TRACT | 3 refills | Status: DC

## 2022-07-15 DIAGNOSIS — M79644 Pain in right finger(s): Secondary | ICD-10-CM | POA: Diagnosis not present

## 2022-07-18 DIAGNOSIS — M25641 Stiffness of right hand, not elsewhere classified: Secondary | ICD-10-CM | POA: Diagnosis not present

## 2022-07-22 DIAGNOSIS — M25641 Stiffness of right hand, not elsewhere classified: Secondary | ICD-10-CM | POA: Diagnosis not present

## 2022-07-29 DIAGNOSIS — M25641 Stiffness of right hand, not elsewhere classified: Secondary | ICD-10-CM | POA: Diagnosis not present

## 2022-07-31 ENCOUNTER — Ambulatory Visit (INDEPENDENT_AMBULATORY_CARE_PROVIDER_SITE_OTHER): Payer: 59 | Admitting: Primary Care

## 2022-07-31 ENCOUNTER — Other Ambulatory Visit: Payer: Self-pay

## 2022-07-31 DIAGNOSIS — M5030 Other cervical disc degeneration, unspecified cervical region: Secondary | ICD-10-CM | POA: Diagnosis not present

## 2022-07-31 DIAGNOSIS — Z79899 Other long term (current) drug therapy: Secondary | ICD-10-CM | POA: Diagnosis not present

## 2022-07-31 DIAGNOSIS — M79644 Pain in right finger(s): Secondary | ICD-10-CM | POA: Diagnosis not present

## 2022-07-31 DIAGNOSIS — M5137 Other intervertebral disc degeneration, lumbosacral region: Secondary | ICD-10-CM | POA: Diagnosis not present

## 2022-07-31 MED ORDER — PHENTERMINE HCL 30 MG PO CAPS
30.0000 mg | ORAL_CAPSULE | Freq: Every day | ORAL | 0 refills | Status: DC
  Filled 2022-07-31: qty 30, 30d supply, fill #0

## 2022-07-31 MED ORDER — OXYCODONE-ACETAMINOPHEN 10-325 MG PO TABS
1.0000 | ORAL_TABLET | ORAL | 0 refills | Status: DC
  Filled 2022-07-31: qty 180, 30d supply, fill #0

## 2022-08-01 ENCOUNTER — Other Ambulatory Visit: Payer: Self-pay

## 2022-08-01 DIAGNOSIS — M79644 Pain in right finger(s): Secondary | ICD-10-CM | POA: Diagnosis not present

## 2022-08-01 DIAGNOSIS — M25649 Stiffness of unspecified hand, not elsewhere classified: Secondary | ICD-10-CM | POA: Diagnosis not present

## 2022-08-04 DIAGNOSIS — Z79899 Other long term (current) drug therapy: Secondary | ICD-10-CM | POA: Diagnosis not present

## 2022-08-05 DIAGNOSIS — M25641 Stiffness of right hand, not elsewhere classified: Secondary | ICD-10-CM | POA: Diagnosis not present

## 2022-08-07 DIAGNOSIS — M25641 Stiffness of right hand, not elsewhere classified: Secondary | ICD-10-CM | POA: Diagnosis not present

## 2022-08-07 DIAGNOSIS — M25649 Stiffness of unspecified hand, not elsewhere classified: Secondary | ICD-10-CM | POA: Diagnosis not present

## 2022-08-08 DIAGNOSIS — M79641 Pain in right hand: Secondary | ICD-10-CM | POA: Diagnosis not present

## 2022-08-12 ENCOUNTER — Ambulatory Visit (INDEPENDENT_AMBULATORY_CARE_PROVIDER_SITE_OTHER): Payer: 59 | Admitting: Primary Care

## 2022-08-12 ENCOUNTER — Encounter (INDEPENDENT_AMBULATORY_CARE_PROVIDER_SITE_OTHER): Payer: Self-pay | Admitting: Primary Care

## 2022-08-12 DIAGNOSIS — Z6841 Body Mass Index (BMI) 40.0 and over, adult: Secondary | ICD-10-CM

## 2022-08-12 DIAGNOSIS — M25641 Stiffness of right hand, not elsewhere classified: Secondary | ICD-10-CM | POA: Diagnosis not present

## 2022-08-14 DIAGNOSIS — M25641 Stiffness of right hand, not elsewhere classified: Secondary | ICD-10-CM | POA: Diagnosis not present

## 2022-08-18 DIAGNOSIS — M25641 Stiffness of right hand, not elsewhere classified: Secondary | ICD-10-CM | POA: Diagnosis not present

## 2022-08-19 ENCOUNTER — Ambulatory Visit (INDEPENDENT_AMBULATORY_CARE_PROVIDER_SITE_OTHER): Payer: 59 | Admitting: Primary Care

## 2022-08-20 DIAGNOSIS — M25641 Stiffness of right hand, not elsewhere classified: Secondary | ICD-10-CM | POA: Diagnosis not present

## 2022-08-21 ENCOUNTER — Other Ambulatory Visit: Payer: Self-pay

## 2022-08-21 DIAGNOSIS — M5137 Other intervertebral disc degeneration, lumbosacral region: Secondary | ICD-10-CM | POA: Diagnosis not present

## 2022-08-21 DIAGNOSIS — M5030 Other cervical disc degeneration, unspecified cervical region: Secondary | ICD-10-CM | POA: Diagnosis not present

## 2022-08-21 DIAGNOSIS — Z79899 Other long term (current) drug therapy: Secondary | ICD-10-CM | POA: Diagnosis not present

## 2022-08-21 DIAGNOSIS — M25641 Stiffness of right hand, not elsewhere classified: Secondary | ICD-10-CM | POA: Diagnosis not present

## 2022-08-21 DIAGNOSIS — E119 Type 2 diabetes mellitus without complications: Secondary | ICD-10-CM | POA: Diagnosis not present

## 2022-08-21 MED ORDER — OXYCODONE-ACETAMINOPHEN 10-325 MG PO TABS
1.0000 | ORAL_TABLET | ORAL | 0 refills | Status: DC
  Filled 2022-08-28: qty 180, 30d supply, fill #0

## 2022-08-21 MED ORDER — PHENTERMINE HCL 30 MG PO CAPS
30.0000 mg | ORAL_CAPSULE | Freq: Every day | ORAL | 0 refills | Status: DC
  Filled 2022-08-21 – 2022-08-28 (×2): qty 30, 30d supply, fill #0

## 2022-08-24 ENCOUNTER — Encounter (INDEPENDENT_AMBULATORY_CARE_PROVIDER_SITE_OTHER): Payer: Self-pay | Admitting: Primary Care

## 2022-08-24 NOTE — Progress Notes (Signed)
[facility]  Angelica Ortiz, is a 57 y.o. female  [id number]  [id number]  DOB - [birth year]  No chief complaint on file.      Subjective:  Angelica Ortiz is a 57 y.o. female here today for a follow up visit for weight management.  She has been successfully losing weight  tirzepatide (MOUNJARO) 10 MG/0.5ML Penhowever the dosage she is to be on is unavailable and therefore doses has been dropped to 10.  She feels this has impede her continued success with weight loss however with manufacture on backorder the only option is to continue the current dose she is on.  Patient has No headache, No chest pain, No abdominal pain - No Nausea, No new weakness tingling or numbness, No Cough - shortness of breath  No problems updated.  Allergies  Allergen Reactions   Dilaudid [Hydromorphone Hcl] Hives and Itching   Iodinated Contrast Media Hives and Itching    Pt given 4 hour prep 06/21/20, c/o itching after injection- [facility]   Levaquin [Levofloxacin] Anaphylaxis   Tangerine Flavor Swelling    Tangerine fruit: Lip and tongue swelling with tangerines   Ace Inhibitors Swelling   Iodine-131 Itching and Rash    Past Medical History:  Diagnosis Date   Anemia    Arthritis    Asthma    Depression    Diabetes mellitus without complication (HCC)    Type 2   Diverticulitis    Falls    Fibromyalgia    Gait instability    GERD (gastroesophageal reflux disease)    History of urinary tract infection    Hypertension    Lupus (HCC)    no meds   Migraine    Morbid obesity 03/30/2013   Overactive bladder    Overactive bladder    Pulmonary embolism (HCC)    Resolved -At age 23 broken right leg, blood clot broke off - into lung   Seasonal allergies    Sleep apnea 06/05/2013   Does not have CPAP machine   Urinary incontinence     Current Outpatient Medications on File Prior to Visit  Medication Sig Dispense Refill   albuterol (VENTOLIN HFA) 108 (90 Base) MCG/ACT inhaler Inhale 2 puffs  into the lungs every 6 (six) hours as needed for shortness of breath or wheezing. 18 g 2   Atogepant (QULIPTA) 60 MG TABS Take 1 tablet (60 mg total) by mouth daily. 90 tablet 0   BIOTIN PO Take 1 tablet by mouth daily.     brimonidine (ALPHAGAN) 0.2 % ophthalmic solution 3 (three) times daily.     budesonide-formoterol (SYMBICORT) 160-4.5 MCG/ACT inhaler Inhale 2 puffs into the lungs daily. 10.2 g 3   CALCIUM PO Take 1 tablet by mouth daily.     cyclobenzaprine (FLEXERIL) 10 MG tablet Take 1 tablet by mouth once daily as needed for severe muscle spasms 30 tablet 2   diclofenac Sodium (VOLTAREN) 1 % GEL Apply 2 g topically daily as needed. 350 g 1   ELDERBERRY PO Take 1 tablet by mouth daily.     fluticasone (FLONASE) 50 MCG/ACT nasal spray Place 1 spray into both nostrils in the morning. 18 mL 3   levothyroxine (SYNTHROID) 150 MCG tablet 1 tablet in the morning on an empty stomach Orally Once a day for 30 day(s)     lidocaine (LIDODERM) 5 % 1 patch daily.     MAGNESIUM PO Take 1 tablet by mouth daily.     Multiple Vitamin (MULTIVITAMIN WITH MINERALS)  TABS tablet Take 1 tablet by mouth 2 (two) times daily.     naloxone (NARCAN) nasal spray 4 mg/0.1 mL 1 spray every 3 minutes; spray 1 dose into ONE nostril; alternate nostrils w each dose until help arrives 2 each 1   NURTEC 75 MG TBDP Take 75 mg by mouth daily as needed (migraine). 48 tablet 4   Oxycodone HCl 10 MG TABS SMARTSIG:1 Tablet(s) By Mouth 6 Times Daily PRN     oxyCODONE-acetaminophen (PERCOCET) 10-325 MG tablet Take 1 tablet by mouth up to 5 times a day as needed for severe pain only 150 tablet 0   oxyCODONE-acetaminophen (PERCOCET) 10-325 MG tablet Take 1 tablet by mouth up to 5 (five) times daily as needed for severe pain only. 150 tablet 0   oxyCODONE-acetaminophen (PERCOCET) 10-325 MG tablet Take 1 tablet by mouth every 4 (four) to 6 (six) hours as needed for pain. 90 tablet 0   oxyCODONE-acetaminophen (PERCOCET) 10-325 MG tablet  Take 1 tablet by mouth every 4 - 6 (four-six) hours as needed for pain. 180 tablet 0   phentermine 15 MG capsule Take 1 capsule (15 mg total) by mouth daily. 30 capsule 0   pregabalin (LYRICA) 50 MG capsule Take 1 capsule (50 mg total) by mouth 2 (two) times daily. 180 capsule 1   tirzepatide (MOUNJARO) 10 MG/0.5ML Pen Inject 10 mg into the skin once a week. 6 mL 6   vitamin B-12 (CYANOCOBALAMIN) 100 MCG tablet Take 100 mcg by mouth daily.     No current facility-administered medications on file prior to visit.    Objective:   Vitals:   08/12/22 1159  BP: 116/80  Pulse: 80  Resp: 16  SpO2: 100%  Weight: 253 lb (114.8 kg)    Comprehensive ROS Pertinent positive and negative noted in HPI   Exam General appearance : Awake, alert, not in any distress. Speech Clear. Not toxic looking HEENT: Atraumatic and Normocephalic, pupils equally reactive to light and accomodation Neck: Supple, no JVD. No cervical lymphadenopathy.  Chest: Good air entry bilaterally, no added sounds  CVS: S1 S2 regular, no murmurs.  Abdomen: Bowel sounds present, Non tender and not distended with no gaurding, rigidity or rebound. Extremities: B/L Lower Ext shows no edema, both legs are warm to touch Neurology: Awake alert, and oriented X 3, CN II-XII intact, Non focal Skin: No Rash  Data Review Lab Results  Component Value Date   HGBA1C 5.5 01/09/2022   HGBA1C 5.5 07/18/2021   HGBA1C 5.8 (H) 10/18/2020    Assessment & Plan  Diagnoses and all orders for this visit:  Morbid obesity She is continuing to monitor her intake and exercise She has refills on tirzepatide [facility]) 10 MG/0.5ML Pen  Patient have been counseled extensively about nutrition and exercise. Other issues discussed during this visit include: low cholesterol diet, weight control and daily exercise, foot care, annual eye examinations at Ophthalmology, importance of adherence with medications and regular follow-up. We also discussed long  term complications of uncontrolled diabetes and hypertension.   No follow-ups on file.  The patient was given clear instructions to go to ER or return to medical center if symptoms don't improve, worsen or new problems develop. The patient verbalized understanding. The patient was told to call to get lab results if they haven't heard anything in the next week.   This note has been created with Education officer, environmental. Any transcriptional errors are unintentional.   Grayce Sessions, NP  08/24/2022, 5:42 PM

## 2022-08-25 DIAGNOSIS — M25649 Stiffness of unspecified hand, not elsewhere classified: Secondary | ICD-10-CM | POA: Diagnosis not present

## 2022-08-25 DIAGNOSIS — M25641 Stiffness of right hand, not elsewhere classified: Secondary | ICD-10-CM | POA: Diagnosis not present

## 2022-08-27 ENCOUNTER — Telehealth (INDEPENDENT_AMBULATORY_CARE_PROVIDER_SITE_OTHER): Payer: Self-pay | Admitting: Primary Care

## 2022-08-27 ENCOUNTER — Other Ambulatory Visit: Payer: Self-pay

## 2022-08-27 DIAGNOSIS — M25641 Stiffness of right hand, not elsewhere classified: Secondary | ICD-10-CM | POA: Diagnosis not present

## 2022-08-27 NOTE — Telephone Encounter (Signed)
Called and left a VM with Pt.

## 2022-08-28 ENCOUNTER — Other Ambulatory Visit: Payer: Self-pay

## 2022-08-31 ENCOUNTER — Other Ambulatory Visit (HOSPITAL_COMMUNITY): Payer: Self-pay

## 2022-08-31 ENCOUNTER — Telehealth: Payer: Self-pay

## 2022-08-31 NOTE — Telephone Encounter (Signed)
Patient Advocate Encounter   Received notification from [employer] Part D that prior authorization is required for Qulipta 60MG  tablets   Submitted: 08-31-2022 Key [id number]  Status is pending

## 2022-09-02 ENCOUNTER — Other Ambulatory Visit: Payer: Self-pay

## 2022-09-02 DIAGNOSIS — M25641 Stiffness of right hand, not elsewhere classified: Secondary | ICD-10-CM | POA: Diagnosis not present

## 2022-09-07 ENCOUNTER — Other Ambulatory Visit (HOSPITAL_COMMUNITY): Payer: Self-pay

## 2022-09-07 NOTE — Telephone Encounter (Signed)
Pharmacy Patient Advocate Encounter  Prior Authorization for Qulipta 60MG  tablets has been APPROVED by [facility] from 09/01/2022 to 04/07/2023.   PA # PA Case ID #: [id number]  Copay is $0 per [facility] test claim.

## 2022-09-08 DIAGNOSIS — M25641 Stiffness of right hand, not elsewhere classified: Secondary | ICD-10-CM | POA: Diagnosis not present

## 2022-09-08 DIAGNOSIS — M25649 Stiffness of unspecified hand, not elsewhere classified: Secondary | ICD-10-CM | POA: Diagnosis not present

## 2022-09-11 DIAGNOSIS — G90519 Complex regional pain syndrome I of unspecified upper limb: Secondary | ICD-10-CM | POA: Diagnosis not present

## 2022-09-11 DIAGNOSIS — M79641 Pain in right hand: Secondary | ICD-10-CM | POA: Diagnosis not present

## 2022-09-18 ENCOUNTER — Other Ambulatory Visit: Payer: Self-pay

## 2022-09-18 DIAGNOSIS — Z79899 Other long term (current) drug therapy: Secondary | ICD-10-CM | POA: Diagnosis not present

## 2022-09-18 DIAGNOSIS — E7800 Pure hypercholesterolemia, unspecified: Secondary | ICD-10-CM | POA: Diagnosis not present

## 2022-09-18 DIAGNOSIS — E559 Vitamin D deficiency, unspecified: Secondary | ICD-10-CM | POA: Diagnosis not present

## 2022-09-18 DIAGNOSIS — R5383 Other fatigue: Secondary | ICD-10-CM | POA: Diagnosis not present

## 2022-09-18 DIAGNOSIS — E119 Type 2 diabetes mellitus without complications: Secondary | ICD-10-CM | POA: Diagnosis not present

## 2022-09-18 DIAGNOSIS — M5137 Other intervertebral disc degeneration, lumbosacral region: Secondary | ICD-10-CM | POA: Diagnosis not present

## 2022-09-18 DIAGNOSIS — M5030 Other cervical disc degeneration, unspecified cervical region: Secondary | ICD-10-CM | POA: Diagnosis not present

## 2022-09-18 MED ORDER — OXYCODONE-ACETAMINOPHEN 10-325 MG PO TABS
1.0000 | ORAL_TABLET | ORAL | 0 refills | Status: DC
  Filled 2022-09-18 – 2022-09-25 (×2): qty 180, 30d supply, fill #0

## 2022-09-18 MED ORDER — PHENTERMINE HCL 30 MG PO CAPS
30.0000 mg | ORAL_CAPSULE | Freq: Every day | ORAL | 0 refills | Status: DC
  Filled 2022-09-18 – 2022-09-25 (×3): qty 30, 30d supply, fill #0

## 2022-09-25 ENCOUNTER — Other Ambulatory Visit: Payer: Self-pay

## 2022-09-25 ENCOUNTER — Other Ambulatory Visit (HOSPITAL_BASED_OUTPATIENT_CLINIC_OR_DEPARTMENT_OTHER): Payer: Self-pay

## 2022-09-27 ENCOUNTER — Other Ambulatory Visit: Payer: Self-pay

## 2022-09-27 ENCOUNTER — Other Ambulatory Visit (HOSPITAL_COMMUNITY): Payer: Self-pay

## 2022-09-27 MED ORDER — MOUNJARO 12.5 MG/0.5ML ~~LOC~~ SOAJ
12.5000 mg | SUBCUTANEOUS | 4 refills | Status: DC
  Filled 2022-09-27: qty 6, 84d supply, fill #0
  Filled 2022-12-10: qty 6, 84d supply, fill #1
  Filled 2022-12-10: qty 2, 28d supply, fill #1
  Filled 2023-01-12: qty 2, 28d supply, fill #2

## 2022-10-02 DIAGNOSIS — M25641 Stiffness of right hand, not elsewhere classified: Secondary | ICD-10-CM | POA: Diagnosis not present

## 2022-10-08 ENCOUNTER — Encounter: Payer: Self-pay | Admitting: Neurology

## 2022-10-08 ENCOUNTER — Telehealth: Payer: Self-pay

## 2022-10-08 ENCOUNTER — Ambulatory Visit (INDEPENDENT_AMBULATORY_CARE_PROVIDER_SITE_OTHER): Payer: 59 | Admitting: Neurology

## 2022-10-08 ENCOUNTER — Other Ambulatory Visit: Payer: Self-pay

## 2022-10-08 DIAGNOSIS — G43809 Other migraine, not intractable, without status migrainosus: Secondary | ICD-10-CM

## 2022-10-08 MED ORDER — AJOVY 225 MG/1.5ML ~~LOC~~ SOAJ
225.0000 mg | SUBCUTANEOUS | 11 refills | Status: DC
  Filled 2022-10-08: qty 1.5, 30d supply, fill #0
  Filled 2022-10-10 – 2022-12-10 (×3): qty 1.5, 28d supply, fill #0
  Filled 2023-01-02: qty 1.5, 28d supply, fill #1
  Filled 2023-02-02: qty 1.5, 28d supply, fill #2
  Filled 2023-03-04: qty 1.5, 28d supply, fill #3
  Filled 2023-04-01: qty 1.5, 28d supply, fill #4

## 2022-10-08 MED ORDER — AJOVY 225 MG/1.5ML ~~LOC~~ SOAJ: 225.0000 mg | SUBCUTANEOUS | 0 refills | Status: DC

## 2022-10-08 MED ORDER — NURTEC 75 MG PO TBDP
75.0000 mg | ORAL_TABLET | Freq: Every day | ORAL | 4 refills | Status: DC | PRN
Start: 2022-10-08 — End: 2023-04-22
  Filled 2022-10-08: qty 18, 30d supply, fill #0
  Filled 2022-10-24: qty 16, 30d supply, fill #0
  Filled 2023-01-02: qty 16, 30d supply, fill #1
  Filled 2023-03-26: qty 16, 30d supply, fill #2

## 2022-10-08 MED ORDER — AJOVY 225 MG/1.5ML ~~LOC~~ SOAJ
225.0000 mg | SUBCUTANEOUS | 11 refills | Status: DC
  Filled 2022-10-08: qty 1.68, fill #0

## 2022-10-08 NOTE — Progress Notes (Signed)
Patient: Angelica Ortiz Date of Birth: Aug 23, 1965  Reason for Visit: Follow up History from: Patient Primary Neurologist: Lucia Gaskins   ASSESSMENT AND PLAN 57 y.o. year old female   1.  Chronic migraine without aura  -Stop Qulipta start Ajovy, had excellent benefit with Ajovy previously, given # 2 samples today  -Continue Nurtec as needed for acute headache treatment -Previously tried and failed: Amitriptyline, Lyrica, magnesium, Nurtec, Aimovig, rizatriptan, Imitrex.  Propranolol contraindicated due to asthma. She had severe constipation with the aimovig. Topamax.  -Follow-up with me virtually in 6 months or sooner if needed  Meds ordered this encounter  Medications   DISCONTD: Fremanezumab-vfrm (AJOVY) 225 MG/1.5ML SOAJ    Sig: Inject 225 mg into the skin every 30 (thirty) days.    Dispense:  1.68 mL    Refill:  11   NURTEC 75 MG TBDP    Sig: Take 1 tablet (75 mg total) by mouth daily as needed (migraine).    Dispense:  48 tablet    Refill:  4    This is a 79-month supply.   Fremanezumab-vfrm (AJOVY) 225 MG/1.5ML SOAJ    Sig: Inject 225 mg into the skin every 30 (thirty) days.    Dispense:  1.68 mL    Refill:  11   HISTORY OF PRESENT ILLNESS: Today 10/08/22 Here for follow-up.  Was previously doing excellent on Ajovy.  In February 2024 her insurance required her to try [place]. The Bennie Pierini is not helping, is miserable. Previously Ajovy was doing excellent would only have 1-2 migraines a month.  Feels like at least 4-5 migraines a week, headache is daily. Takes  Nurtec with benefit, but maxing out. Relies on Excedrin Migraine, causes rebound. Goes to pain management for DDD, chronic pain issues, arthritis, herniated disc in her neck. Is on short term disability for complex regional pain syndrome to right hand. Cannot type.   HISTORY  10/03/2021: Arnetha Massy is  life changing.  Before ajovy, baseline was at least 20 headache days out of the month, at least 15 of those are moderate to severe  migraines. Now she has reduces to 4 migraines a month and no headaches. It is working extremely well. The few times she did have one, she took one nurtec and it terminated the headache. It has made a huge difference. She has herniated disks in her neck and she has shooting pain and that triggers her migraine but otherwise she is doing "absolutely a god send"   Patient complains of symptoms per HPI as well as the following symptoms: migraines . Pertinent negatives and positives per HPI. All others negative     HPI:  Angelica Ortiz is a 57 y.o. female here as requested by Arnette Felts, FNP for chronic migraines.  Past medical history diabetes, depression, fibromyalgia, asthma, lupus, asthma, morbid obesity, hypertension, migraines, B12 deficiency.  I have reviewed Dr. Kathi Der notes, patient has tried several different medications for her headaches and migraines, not responding to any including the Nurtec, I reviewed notes in epic I could not find any more significant information on her migraines or headaches, I also looked for imaging in epic and "care everywhere" no imaging of the brain, does not appear as though she seen anybody for migraines or headaches and "Care Everywhere".No aura. No medication overuse.    She has been suffering with migraines since a teenager, started worsening as a young adult, diagnosed 2008-2009. The last 6 months she can have severe migraines, they are on the  left side, pulsating/pounding/throbbing/photophobia/phonophobia/nausea, eye pain like pressure behind the eye, shw wakes up with headaches and can be worse supine,no significant snoring, refreshed no excessive daytime somnolence, it can keep her in bed all day can last up to 24 hours, slamming of doors makes it worse, a dark room helps and a sleep mask, she has room darkening shades, at least 20 headache days out of the month, at least 15 of those are moderate to severe migraines. Nurtec works significantly. No hearing changes but  she gets blurry vision, worsening and severe headaches. She has a lot of nausea with the headaches. No medication overuse. Smells can trigger a migraine. No other focal neurologic deficits, associated symptoms, inciting events or modifiable factors.     Reviewed notes, labs and imaging from outside physicians, which showed: 03/2021 cbc/cmp nml 10/2020: tsh nml   From a thorough review of records, medications tried that can be used in migraine management include: Amitriptyline, Lyrica, magnesium, Nurtec, Aimovig, rizatriptan, Imitrex.  Propranolol contraindicated due to asthma. She had severe constipation with the aimovig. Topamax.   REVIEW OF SYSTEMS: Out of a complete 14 system review of symptoms, the patient complains only of the following symptoms, and all other reviewed systems are negative.  See HPI  ALLERGIES: Allergies  Allergen Reactions   Dilaudid [Hydromorphone Hcl] Hives and Itching   Iodinated Contrast Media Hives and Itching    Pt given 4 hour prep 06/21/20, c/o itching after injection- [facility]   Levaquin [Levofloxacin] Anaphylaxis   Tangerine Flavor Swelling    Tangerine fruit: Lip and tongue swelling with tangerines   Ace Inhibitors Swelling   Iodine-131 Itching and Rash    HOME MEDICATIONS: Outpatient Medications Prior to Visit  Medication Sig Dispense Refill   albuterol (VENTOLIN HFA) 108 (90 Base) MCG/ACT inhaler Inhale 2 puffs into the lungs every 6 (six) hours as needed for shortness of breath or wheezing. 18 g 2   BIOTIN PO Take 1 tablet by mouth daily.     brimonidine (ALPHAGAN) 0.2 % ophthalmic solution 3 (three) times daily.     budesonide-formoterol (SYMBICORT) 160-4.5 MCG/ACT inhaler Inhale 2 puffs into the lungs daily. 10.2 g 3   CALCIUM PO Take 1 tablet by mouth daily.     cyclobenzaprine (FLEXERIL) 10 MG tablet Take 1 tablet by mouth once daily as needed for severe muscle spasms 30 tablet 2   diclofenac Sodium (VOLTAREN) 1 % GEL Apply 2 g topically daily as  needed. 350 g 1   ELDERBERRY PO Take 1 tablet by mouth daily.     fluticasone (FLONASE) 50 MCG/ACT nasal spray Place 1 spray into both nostrils in the morning. 18 mL 3   levothyroxine (SYNTHROID) 150 MCG tablet 1 tablet in the morning on an empty stomach Orally Once a day for 30 day(s)     lidocaine (LIDODERM) 5 % 1 patch daily.     MAGNESIUM PO Take 1 tablet by mouth daily.     Multiple Vitamin (MULTIVITAMIN WITH MINERALS) TABS tablet Take 1 tablet by mouth 2 (two) times daily.     naloxone (NARCAN) nasal spray 4 mg/0.1 mL 1 spray every 3 minutes; spray 1 dose into ONE nostril; alternate nostrils w each dose until help arrives 2 each 1   Oxycodone HCl 10 MG TABS SMARTSIG:1 Tablet(s) By Mouth 6 Times Daily PRN     oxyCODONE-acetaminophen (PERCOCET) 10-325 MG tablet Take 1 tablet by mouth up to 5 times a day as needed for severe pain only 150 tablet  0   oxyCODONE-acetaminophen (PERCOCET) 10-325 MG tablet Take 1 tablet by mouth up to 5 (five) times daily as needed for severe pain only. 150 tablet 0   oxyCODONE-acetaminophen (PERCOCET) 10-325 MG tablet Take 1 tablet by mouth every 4 (four) to 6 (six) hours as needed for pain. 90 tablet 0   oxyCODONE-acetaminophen (PERCOCET) 10-325 MG tablet Take 1 tablet by mouth every 4 - 6 (four-six) hours as needed for pain. 180 tablet 0   oxyCODONE-acetaminophen (PERCOCET) 10-325 MG tablet Take 1 tablet by mouth every 4 to 6 hours as needed for pain. 180 tablet 0   phentermine 15 MG capsule Take 1 capsule (15 mg total) by mouth daily. 30 capsule 0   phentermine 30 MG capsule Take 1 capsule (30 mg total) by mouth daily. 30 capsule 0   pregabalin (LYRICA) 50 MG capsule Take 1 capsule (50 mg total) by mouth 2 (two) times daily. 180 capsule 1   tirzepatide (MOUNJARO) 12.5 MG/0.5ML Pen Inject 12.5 mg into the skin once a week. 6 mL 4   vitamin B-12 (CYANOCOBALAMIN) 100 MCG tablet Take 100 mcg by mouth daily.     Atogepant (QULIPTA) 60 MG TABS Take 1 tablet (60 mg  total) by mouth daily. 90 tablet 0   NURTEC 75 MG TBDP Take 75 mg by mouth daily as needed (migraine). 48 tablet 4   tirzepatide (MOUNJARO) 10 MG/0.5ML Pen Inject 10 mg into the skin once a week. 6 mL 6   No facility-administered medications prior to visit.    PAST MEDICAL HISTORY: Past Medical History:  Diagnosis Date   Anemia    Arthritis    Asthma    Depression    Diabetes mellitus without complication (HCC)    Type 2   Diverticulitis    Falls    Fibromyalgia    Gait instability    GERD (gastroesophageal reflux disease)    History of urinary tract infection    Hypertension    Lupus (HCC)    no meds   Migraine    Morbid obesity 03/30/2013   Overactive bladder    Overactive bladder    Pulmonary embolism (HCC)    Resolved -At age 67 broken right leg, blood clot broke off - into lung   Seasonal allergies    Sleep apnea 06/05/2013   Does not have CPAP machine   Urinary incontinence     PAST SURGICAL HISTORY: Past Surgical History:  Procedure Laterality Date   ABDOMINAL HYSTERECTOMY     APPENDECTOMY     CESAREAN SECTION  04/08/1999   x 1   CHOLECYSTECTOMY     DILATION AND CURETTAGE OF UTERUS     x 3 for AUB   GASTRIC ROUX-EN-Y N/A 03/13/2015   Procedure: LAPAROSCOPIC ROUX-EN-Y GASTRIC BYPASS  ENTEROLYSIS OF ADHESIONS WITH UPPER ENDOSCOPY;  Surgeon: Ovidio Kin, MD;  Location: [facility];  Service: General;  Laterality: N/A;   HYSTEROSCOPY WITH NOVASURE N/A 03/14/2014   Procedure: HYSTEROSCOPY WITH NOVASURE;  Surgeon: Willodean Rosenthal, MD;  Location: [facility];  Service: Gynecology;  Laterality: N/A;   LAMINECTOMY     L4-L5   LAPAROSCOPIC LYSIS OF ADHESIONS  03/13/2015   Procedure: LAPAROSCOPIC LYSIS OF ADHESIONS;  Surgeon: Ovidio Kin, MD;  Location: [facility];  Service: General;;   LAPAROSCOPIC OOPHERECTOMY Right 09/05/2014   Procedure: abdominal OOPHERECTOMY;  Surgeon: Willodean Rosenthal, MD;  Location: [facility];  Service: Gynecology;  Laterality: Right;    LAPAROSCOPIC OVARIAN CYSTECTOMY Right 04/07/1992   ORIF ANKLE FRACTURE  Left    OVARIAN CYST REMOVAL Left 09/05/2014   Procedure: OVARIAN CYSTECTOMY and portion of left ovary removed ;  Surgeon: Willodean Rosenthal, MD;  Location: [facility];  Service: Gynecology;  Laterality: Left;   ROTATOR CUFF REPAIR     right   ROTATOR CUFF REPAIR Right    SALPINGOOPHORECTOMY Bilateral 09/05/2014   Procedure: BILATERAL SALPINGECTOMY;  Surgeon: Willodean Rosenthal, MD;  Location: [facility];  Service: Gynecology;  Laterality: Bilateral;   SHOULDER ARTHROSCOPY WITH ROTATOR CUFF REPAIR AND SUBACROMIAL DECOMPRESSION Left 11/01/2020   Procedure: SHOULDER ARTHROSCOPY WITH ROTATOR CUFF REPAIR AND SUBACROMIAL DECOMPRESSION, DISTAL CLAVICLE EXCISION;  Surgeon: Jones Broom, MD;  Location: [facility];  Service: Orthopedics;  Laterality: Left;   SUPRACERVICAL ABDOMINAL HYSTERECTOMY N/A 09/05/2014   Procedure: HYSTERECTOMY SUPRACERVICAL ABDOMINAL;  Surgeon: Willodean Rosenthal, MD;  Location: [facility];  Service: Gynecology;  Laterality: N/A;   TONSILLECTOMY     UPPER GI ENDOSCOPY  03/13/2015   Procedure: UPPER GI ENDOSCOPY;  Surgeon: Ovidio Kin, MD;  Location: [facility];  Service: General;;    FAMILY HISTORY: Family History  Problem Relation Age of Onset   Diabetes Mother    Hypertension Mother    Heart disease Mother    Cancer Father    Heart disease Father    Hypertension Sister    Cancer Sister    Diabetes Brother    Hypertension Brother    Cancer Maternal Uncle    Cancer Paternal Uncle    Breast cancer Neg Hx     SOCIAL HISTORY: Social History   Socioeconomic History   Marital status: Divorced    Spouse name: Not on file   Number of children: Not on file   Years of education: Not on file   Highest education level: Not on file  Occupational History   Not on file  Tobacco Use   Smoking status: Never   Smokeless tobacco: Never  Vaping Use   Vaping Use: Never used  Substance and Sexual  Activity   Alcohol use: No   Drug use: No   Sexual activity: Not Currently    Birth control/protection: None  Other Topics Concern   Not on file  Social History Narrative   Not on file   Social Determinants of Health   Financial Resource Strain: Low Risk  (11/29/2021)   Overall Financial Resource Strain (CARDIA)    Difficulty of Paying Living Expenses: Not hard at all  Food Insecurity: No Food Insecurity (11/29/2021)   Hunger Vital Sign    Worried About Running Out of Food in the Last Year: Never true    Ran Out of Food in the Last Year: Never true  Transportation Needs: Unmet Transportation Needs (11/29/2021)   PRAPARE - Transportation    Lack of Transportation (Medical): Yes    Lack of Transportation (Non-Medical): Yes  Physical Activity: Inactive (11/29/2021)   Exercise Vital Sign    Days of Exercise per Week: 0 days    Minutes of Exercise per Session: 0 min  Stress: No Stress Concern Present (11/29/2021)   [employer] of Occupational Health - Occupational Stress Questionnaire    Feeling of Stress : Not at all  Social Connections: Moderately Integrated (11/29/2021)   Social Connection and Isolation Panel [NHANES]    Frequency of Communication with Friends and Family: More than three times a week    Frequency of Social Gatherings with Friends and Family: More than three times a week    Attends Religious Services: More than 4 times per  year    Active Member of Clubs or Organizations: Yes    Attends Banker Meetings: More than 4 times per year    Marital Status: Divorced  Intimate Partner Violence: Not At Risk (11/29/2021)   Humiliation, Afraid, Rape, and Kick questionnaire    Fear of Current or Ex-Partner: No    Emotionally Abused: No    Physically Abused: No    Sexually Abused: No    PHYSICAL EXAM  Vitals:   10/08/22 1323  BP: 104/72  Pulse: 97  Weight: 241 lb 8 oz (109.5 kg)  Height: 5\' 6"  (1.676 m)   Body mass index is 38.98  kg/m.  Generalized: Well developed, in no acute distress, looks tired  Neurological examination  Mentation: Alert oriented to time, place, history taking. Follows all commands speech and language fluent Cranial nerve II-XII: Pupils were equal round reactive to light. Extraocular movements were full, visual field were full on confrontational test. Facial sensation and strength were normal. Head turning and shoulder shrug were normal and symmetric. Motor: The motor testing reveals 5 over 5 strength of all 4 extremities, but pain to right hand very sensitive  Sensory: Sensory testing is intact to soft touch on all 4 extremities. No evidence of extinction is noted.  Coordination: Cerebellar testing reveals good finger-nose-finger and heel-to-shin except right hand was not tested  Gait and station: Gait is normal.  Reflexes: Deep tendon reflexes are symmetric and normal bilaterally.   DIAGNOSTIC DATA (LABS, IMAGING, TESTING) - I reviewed patient records, labs, notes, testing and imaging myself where available.  Lab Results  Component Value Date   WBC 4.2 05/01/2022   HGB 12.9 05/01/2022   HCT 39.2 05/01/2022   MCV 86 05/01/2022   PLT 206 05/01/2022      Component Value Date/Time   NA 143 01/09/2022 1437   K 4.4 01/09/2022 1437   CL 104 01/09/2022 1437   CO2 24 01/09/2022 1437   GLUCOSE 107 (H) 01/09/2022 1437   GLUCOSE 131 (H) 06/20/2020 2153   BUN 17 01/09/2022 1437   CREATININE 0.83 01/09/2022 1437   CALCIUM 9.5 01/09/2022 1437   PROT 7.0 01/09/2022 1437   ALBUMIN 4.2 01/09/2022 1437   AST 18 01/09/2022 1437   ALT 17 01/09/2022 1437   ALKPHOS 107 01/09/2022 1437   BILITOT 0.4 01/09/2022 1437   GFRNONAA >60 06/20/2020 2153   GFRAA >60 03/09/2015 1155   Lab Results  Component Value Date   CHOL 145 05/01/2022   HDL 64 05/01/2022   LDLCALC 67 05/01/2022   TRIG 72 05/01/2022   CHOLHDL 2.3 05/01/2022   Lab Results  Component Value Date   HGBA1C 5.5 01/09/2022   Lab  Results  Component Value Date   VITAMINB12 >2000 (H) 05/01/2022   Lab Results  Component Value Date   TSH 1.820 05/01/2022    Margie Ege, AGNP-C, DNP 10/08/2022, 1:59 PM [facility] [address] [place], [place] [address] [phone]

## 2022-10-08 NOTE — Telephone Encounter (Signed)
2 boxes Ajovy given per Margie Ege, NP

## 2022-10-10 ENCOUNTER — Other Ambulatory Visit: Payer: Self-pay

## 2022-10-10 DIAGNOSIS — M79641 Pain in right hand: Secondary | ICD-10-CM | POA: Diagnosis not present

## 2022-10-10 DIAGNOSIS — M792 Neuralgia and neuritis, unspecified: Secondary | ICD-10-CM | POA: Diagnosis not present

## 2022-10-10 DIAGNOSIS — G90519 Complex regional pain syndrome I of unspecified upper limb: Secondary | ICD-10-CM | POA: Diagnosis not present

## 2022-10-14 ENCOUNTER — Other Ambulatory Visit: Payer: Self-pay

## 2022-10-14 DIAGNOSIS — M25641 Stiffness of right hand, not elsewhere classified: Secondary | ICD-10-CM | POA: Diagnosis not present

## 2022-10-16 ENCOUNTER — Other Ambulatory Visit: Payer: Self-pay

## 2022-10-16 DIAGNOSIS — M5030 Other cervical disc degeneration, unspecified cervical region: Secondary | ICD-10-CM | POA: Diagnosis not present

## 2022-10-16 DIAGNOSIS — E119 Type 2 diabetes mellitus without complications: Secondary | ICD-10-CM | POA: Diagnosis not present

## 2022-10-16 DIAGNOSIS — E7800 Pure hypercholesterolemia, unspecified: Secondary | ICD-10-CM | POA: Diagnosis not present

## 2022-10-16 DIAGNOSIS — R5383 Other fatigue: Secondary | ICD-10-CM | POA: Diagnosis not present

## 2022-10-16 DIAGNOSIS — E559 Vitamin D deficiency, unspecified: Secondary | ICD-10-CM | POA: Diagnosis not present

## 2022-10-16 DIAGNOSIS — Z79899 Other long term (current) drug therapy: Secondary | ICD-10-CM | POA: Diagnosis not present

## 2022-10-16 MED ORDER — PHENTERMINE HCL 30 MG PO CAPS
30.0000 mg | ORAL_CAPSULE | Freq: Every day | ORAL | 0 refills | Status: DC
  Filled 2022-10-16 – 2022-11-05 (×3): qty 30, 30d supply, fill #0

## 2022-10-16 MED ORDER — OXYCODONE-ACETAMINOPHEN 10-325 MG PO TABS
1.0000 | ORAL_TABLET | ORAL | 0 refills | Status: DC | PRN
  Filled 2022-10-24: qty 180, 30d supply, fill #0

## 2022-10-20 DIAGNOSIS — R5383 Other fatigue: Secondary | ICD-10-CM | POA: Diagnosis not present

## 2022-10-20 DIAGNOSIS — M25561 Pain in right knee: Secondary | ICD-10-CM | POA: Diagnosis not present

## 2022-10-20 DIAGNOSIS — E559 Vitamin D deficiency, unspecified: Secondary | ICD-10-CM | POA: Diagnosis not present

## 2022-10-20 DIAGNOSIS — E119 Type 2 diabetes mellitus without complications: Secondary | ICD-10-CM | POA: Diagnosis not present

## 2022-10-20 DIAGNOSIS — Z79899 Other long term (current) drug therapy: Secondary | ICD-10-CM | POA: Diagnosis not present

## 2022-10-20 DIAGNOSIS — E7800 Pure hypercholesterolemia, unspecified: Secondary | ICD-10-CM | POA: Diagnosis not present

## 2022-10-21 ENCOUNTER — Other Ambulatory Visit: Payer: Self-pay

## 2022-10-22 DIAGNOSIS — G90511 Complex regional pain syndrome I of right upper limb: Secondary | ICD-10-CM | POA: Diagnosis not present

## 2022-10-24 ENCOUNTER — Other Ambulatory Visit: Payer: Self-pay

## 2022-10-24 DIAGNOSIS — M25641 Stiffness of right hand, not elsewhere classified: Secondary | ICD-10-CM | POA: Diagnosis not present

## 2022-10-29 ENCOUNTER — Other Ambulatory Visit: Payer: Self-pay

## 2022-10-30 ENCOUNTER — Other Ambulatory Visit: Payer: Self-pay

## 2022-11-03 DIAGNOSIS — M25641 Stiffness of right hand, not elsewhere classified: Secondary | ICD-10-CM | POA: Diagnosis not present

## 2022-11-05 ENCOUNTER — Other Ambulatory Visit: Payer: Self-pay

## 2022-11-13 ENCOUNTER — Telehealth (INDEPENDENT_AMBULATORY_CARE_PROVIDER_SITE_OTHER): Payer: Self-pay | Admitting: Primary Care

## 2022-11-13 ENCOUNTER — Ambulatory Visit (INDEPENDENT_AMBULATORY_CARE_PROVIDER_SITE_OTHER): Payer: 59 | Admitting: Primary Care

## 2022-11-13 ENCOUNTER — Other Ambulatory Visit (HOSPITAL_COMMUNITY)
Admission: RE | Admit: 2022-11-13 | Discharge: 2022-11-13 | Disposition: A | Discharge status: Home / Self Care | Payer: 59 | Source: Ambulatory Visit | Attending: Primary Care | Admitting: Primary Care

## 2022-11-13 ENCOUNTER — Encounter (INDEPENDENT_AMBULATORY_CARE_PROVIDER_SITE_OTHER): Payer: Self-pay | Admitting: Primary Care

## 2022-11-13 ENCOUNTER — Other Ambulatory Visit: Payer: Self-pay

## 2022-11-13 VITALS — BP 114/80 | HR 96 | Ht 66.0 in | Wt 224.6 lb

## 2022-11-13 DIAGNOSIS — F329 Major depressive disorder, single episode, unspecified: Secondary | ICD-10-CM

## 2022-11-13 DIAGNOSIS — Z6836 Body mass index (BMI) 36.0-36.9, adult: Secondary | ICD-10-CM

## 2022-11-13 DIAGNOSIS — N898 Other specified noninflammatory disorders of vagina: Secondary | ICD-10-CM | POA: Insufficient documentation

## 2022-11-13 DIAGNOSIS — G90513 Complex regional pain syndrome I of upper limb, bilateral: Secondary | ICD-10-CM

## 2022-11-13 DIAGNOSIS — F5102 Adjustment insomnia: Secondary | ICD-10-CM | POA: Diagnosis not present

## 2022-11-13 DIAGNOSIS — E6609 Other obesity due to excess calories: Secondary | ICD-10-CM

## 2022-11-13 MED ORDER — QUETIAPINE FUMARATE 25 MG PO TABS
25.0000 mg | ORAL_TABLET | Freq: Every day | ORAL | 1 refills | Status: DC
  Filled 2022-11-13: qty 30, 30d supply, fill #0
  Filled 2022-12-10: qty 30, 30d supply, fill #1

## 2022-11-13 NOTE — Telephone Encounter (Signed)
Spoke to pt about apt. She will be present

## 2022-11-13 NOTE — Patient Instructions (Signed)
Quetiapine Tablets What is this medication? QUETIAPINE (kwe TYE a peen) treats schizophrenia and bipolar disorder. It works by balancing the levels of dopamine and serotonin in your brain, hormones that help regulate mood, behaviors, and thoughts. It belongs to a group of medications called antipsychotics. Antipsychotic medications can be used to treat several kinds of mental health conditions. This medicine may be used for other purposes; ask your health care provider or pharmacist if you have questions. COMMON BRAND NAME(S): Seroquel What should I tell my care team before I take this medication? They need to know if you have any of these conditions: Blockage in your bowels Cataracts Constipation Dementia Diabetes Difficulty swallowing Glaucoma Heart disease High levels of prolactin History of breast cancer History of irregular heartbeat Liver disease Low blood cell levels (white cells, red cells, and platelets) Low blood pressure Parkinson disease Prostate disease Seizures Suicidal thoughts, plans, or attempt by you or a family member Thyroid disease Trouble passing urine An unusual or allergic reaction to quetiapine, other medications, foods, dyes, or preservatives Pregnant or trying to get pregnant Breastfeeding How should I use this medication? Take this medication by mouth with water. Take it as directed on the prescription label at the same time every day. You can take it with or without food. If it upsets your stomach, take it with food. Keep taking it unless your care team tells you to stop. A special MedGuide will be given to you by the pharmacist with each prescription and refill. Be sure to read this information carefully each time. Talk to your care team about the use of this medication in children. While this medication may be prescribed for children as young as 10 years for selected conditions, precautions do apply. People over 1 years of age may have a stronger  reaction to this medication and need smaller doses. Overdosage: If you think you have taken too much of this medicine contact a poison control center or emergency room at once. NOTE: This medicine is only for you. Do not share this medicine with others. What if I miss a dose? If you miss a dose, take it as soon as you can. If it is almost time for your next dose, take only that dose. Do not take double or extra doses. What may interact with this medication? Do not take this medication with any of the following: Cisapride Dronedarone Metoclopramide Pimozide Thioridazine This medication may also interact with the following: Alcohol Antihistamines for allergy, cough, and cold Atropine Avasimibe Certain antivirals for HIV or hepatitis Certain medications for anxiety or sleep Certain medications for bladder problems, such as oxybutynin, tolterodine Certain medications for depression, such as amitriptyline, fluoxetine, nefazodone, sertraline Certain medications for fungal infections, such as fluconazole, ketoconazole, itraconazole, posaconazole Certain medications for stomach problems, such as dicyclomine, hyoscyamine Certain medications for travel sickness, such as scopolamine Cimetidine General anesthetics, such as halothane, isoflurane, methoxyflurane, propofol Ipratropium Levodopa or other medications for Parkinson disease Medications for blood pressure Medications for seizures Medications that relax muscles for surgery Opioid medications for pain Other medications that cause heart rhythm changes Phenothiazines, such as chlorpromazine, prochlorperazine Rifampin St. 's wort This list may not describe all possible interactions. Give your health care provider a list of all the medicines, herbs, non-prescription drugs, or dietary supplements you use. Also tell them if you smoke, drink alcohol, or use illegal drugs. Some items may interact with your medicine. What should I watch for  while using this medication? Visit your care team for regular  checks on your progress. Tell your care team if your symptoms do not start to get better or if they get worse. Do not suddenly stop taking This medication. You may develop a severe reaction. Your care team will tell you how much medication to take. If your care team wants you to stop the medication, the dose may be slowly lowered over time to avoid any side effects. You may need to have an eye exam before and during use of this medication. This medication may increase blood sugar. Ask your care team if changes in diet or medications are needed if you have diabetes. This medication may cause thoughts of suicide or depression. This includes sudden changes in mood, behaviors, or thoughts. These changes can happen at any time but are more common in the beginning of treatment or after a change in dose. Call your care team right away if you experience these thoughts or worsening depression. This medication may affect your coordination, reaction time, or judgment. Do not drive or operate machinery until you know how this medication affects you. Sit up or stand slowly to reduce the risk of dizzy or fainting spells. Drinking alcohol with this medication can increase the risk of these side effects. This medication can cause problems with controlling your body temperature. It can lower the response of your body to cold temperatures. If possible, stay indoors during cold weather. If you must go outdoors, wear warm clothes. It can also lower the response of your body to heat. Do not overheat. Do not over-exercise. Stay out of the sun when possible. If you must be in the sun, wear cool clothing. Drink plenty of water. If you have trouble controlling your body temperature, call your care team right away. What side effects may I notice from receiving this medication? Side effects that you should report to your care team as soon as possible: Allergic  reactions--skin rash, itching, hives, swelling of the face, lips, tongue, or throat Heart rhythm changes--fast or irregular heartbeat, dizziness, feeling faint or lightheaded, chest pain, trouble breathing High blood sugar (hyperglycemia)--increased thirst or amount of urine, unusual weakness or fatigue, blurry vision High fever, stiff muscles, increased sweating, fast or irregular heartbeat, and confusion, which may be signs of neuroleptic malignant syndrome High prolactin level--unexpected breast tissue growth, discharge from the nipple, change in sex drive or performance, irregular menstrual cycle Increase in blood pressure in children Infection--fever, chills, cough, or sore throat Low blood pressure--dizziness, feeling faint or lightheaded, blurry vision Low thyroid levels (hypothyroidism)--unusual weakness or fatigue, increased sensitivity to cold, constipation, hair loss, dry skin, weight gain, feelings of depression Pain or trouble swallowing Seizures Stroke--sudden numbness or weakness of the face, arm, or leg, trouble speaking, confusion, trouble walking, loss of balance or coordination, dizziness, severe headache, change in vision Sudden eye pain or change in vision such as blurry vision, seeing halos around lights, vision loss Thoughts of suicide or self-harm, worsening mood, feelings of depression Trouble passing urine Uncontrolled and repetitive body movements, muscle stiffness or spasms, tremors or shaking, loss of balance or coordination, restlessness, shuffling walk, which may be signs of extrapyramidal symptoms (EPS) Side effects that usually do not require medical attention (report to your care team if they continue or are bothersome): Constipation Dizziness Drowsiness Dry mouth Weight gain This list may not describe all possible side effects. Call your doctor for medical advice about side effects. You may report side effects to FDA at 1-800-FDA-1088. Where should I keep my  medication? Keep out  of the reach of children. Store at room temperature between 15 and 30 degrees C (59 and 86 degrees F). Throw away any unused medication after the expiration date. NOTE: This sheet is a summary. It may not cover all possible information. If you have questions about this medicine, talk to your doctor, pharmacist, or health care provider.  2024 Elsevier/Gold Standard (2021-10-07 00:00:00)

## 2022-11-13 NOTE — Progress Notes (Signed)
[facility]  [name], is a 57 y.o. female  [id number]  [id number]  DOB - 09-05-1965  Chief Complaint  Patient presents with   Medical Management of Chronic Issues    Referral for hand pain        Subjective:   Angelica Ortiz is a 57 y.o. female here today for a follow up visit. Depressed in a relationship feels fiance is cheating ( went through his phone and learned disturbing news- hurt , distraught, angry -crying the entire visit.  She is requesting a referral for a physician who specializes in CRPS.  Patient has No headache, No chest pain, No abdominal pain - No Nausea, No new weakness tingling or numbness, No Cough - shortness of breath  No problems updated.  Allergies  Allergen Reactions   Dilaudid [Hydromorphone Hcl] Hives and Itching   Iodinated Contrast Media Hives and Itching    Pt given 4 hour prep 06/21/20, c/o itching after injection- [facility]   Levaquin [Levofloxacin] Anaphylaxis   Tangerine Flavor Swelling    Tangerine fruit: Lip and tongue swelling with tangerines   Ace Inhibitors Swelling   Iodine-131 Itching and Rash    Past Medical History:  Diagnosis Date   Anemia    Arthritis    Asthma    Depression    Diabetes mellitus without complication (HCC)    Type 2   Diverticulitis    Falls    Fibromyalgia    Gait instability    GERD (gastroesophageal reflux disease)    History of urinary tract infection    Hypertension    Lupus (HCC)    no meds   Migraine    Morbid obesity 03/30/2013   Overactive bladder    Overactive bladder    Pulmonary embolism (HCC)    Resolved -At age 25 broken right leg, blood clot broke off - into lung   Seasonal allergies    Sleep apnea 06/05/2013   Does not have CPAP machine   Urinary incontinence     Current Outpatient Medications on File Prior to Visit  Medication Sig Dispense Refill   albuterol (VENTOLIN HFA) 108 (90 Base) MCG/ACT inhaler Inhale 2 puffs into the lungs every 6 (six) hours as needed  for shortness of breath or wheezing. 18 g 2   BIOTIN PO Take 1 tablet by mouth daily.     brimonidine (ALPHAGAN) 0.2 % ophthalmic solution 3 (three) times daily.     budesonide-formoterol (SYMBICORT) 160-4.5 MCG/ACT inhaler Inhale 2 puffs into the lungs daily. 10.2 g 3   CALCIUM PO Take 1 tablet by mouth daily.     cyclobenzaprine (FLEXERIL) 10 MG tablet Take 1 tablet by mouth once daily as needed for severe muscle spasms 30 tablet 2   diclofenac Sodium (VOLTAREN) 1 % GEL Apply 2 g topically daily as needed. 350 g 1   DULoxetine (CYMBALTA) 30 MG capsule Take 30 mg by mouth daily.     ELDERBERRY PO Take 1 tablet by mouth daily.     fluticasone (FLONASE) 50 MCG/ACT nasal spray Place 1 spray into both nostrils in the morning. 18 mL 3   Fremanezumab-vfrm (AJOVY) 225 MG/1.5ML SOAJ Inject 225 mg into the skin every 30 (thirty) days. 3 mL 0   Fremanezumab-vfrm (AJOVY) 225 MG/1.5ML SOAJ Inject 225 mg into the skin every 30 (thirty) days. 1.5 mL 11   lidocaine (LIDODERM) 5 % 1 patch daily.     MAGNESIUM PO Take 1 tablet by mouth daily.  Multiple Vitamin (MULTIVITAMIN WITH MINERALS) TABS tablet Take 1 tablet by mouth 2 (two) times daily.     naloxone (NARCAN) nasal spray 4 mg/0.1 mL 1 spray every 3 minutes; spray 1 dose into ONE nostril; alternate nostrils w each dose until help arrives 2 each 1   NURTEC 75 MG TBDP Take 1 tablet (75 mg total) by mouth daily as needed (migraine). 48 tablet 4   oxyCODONE-acetaminophen (PERCOCET) 10-325 MG tablet Take 1 tablet by mouth every 4 (four) to 6 (six)  hours as needed for pain. 180 tablet 0   phentermine 30 MG capsule Take 1 capsule (30 mg total) by mouth daily. 30 capsule 0   pregabalin (LYRICA) 50 MG capsule Take 1 capsule (50 mg total) by mouth 2 (two) times daily. 180 capsule 1   tirzepatide (MOUNJARO) 12.5 MG/0.5ML Pen Inject 12.5 mg into the skin once a week. 6 mL 4   vitamin B-12 (CYANOCOBALAMIN) 100 MCG tablet Take 100 mcg by mouth daily.      levothyroxine (SYNTHROID) 150 MCG tablet 1 tablet in the morning on an empty stomach Orally Once a day for 30 day(s) (Patient not taking: Reported on 11/13/2022)     Oxycodone HCl 10 MG TABS SMARTSIG:1 Tablet(s) By Mouth 6 Times Daily PRN (Patient not taking: Reported on 11/13/2022)     oxyCODONE-acetaminophen (PERCOCET) 10-325 MG tablet Take 1 tablet by mouth up to 5 times a day as needed for severe pain only (Patient not taking: Reported on 11/13/2022) 150 tablet 0   oxyCODONE-acetaminophen (PERCOCET) 10-325 MG tablet Take 1 tablet by mouth up to 5 (five) times daily as needed for severe pain only. (Patient not taking: Reported on 11/13/2022) 150 tablet 0   oxyCODONE-acetaminophen (PERCOCET) 10-325 MG tablet Take 1 tablet by mouth every 4 (four) to 6 (six) hours as needed for pain. (Patient not taking: Reported on 11/13/2022) 90 tablet 0   oxyCODONE-acetaminophen (PERCOCET) 10-325 MG tablet Take 1 tablet by mouth every 4 - 6 (four-six) hours as needed for pain. (Patient not taking: Reported on 11/13/2022) 180 tablet 0   oxyCODONE-acetaminophen (PERCOCET) 10-325 MG tablet Take 1 tablet by mouth every 4 to 6 hours as needed for pain. (Patient not taking: Reported on 11/13/2022) 180 tablet 0   phentermine 15 MG capsule Take 1 capsule (15 mg total) by mouth daily. (Patient not taking: Reported on 11/13/2022) 30 capsule 0   tirzepatide (MOUNJARO) 10 MG/0.5ML Pen Inject 10 mg into the skin once a week. 6 mL 6   No current facility-administered medications on file prior to visit.    Objective:   Vitals:   11/13/22 1547  BP: 114/80  Pulse: 96  SpO2: 100%  Weight: 224 lb 9.6 oz (101.9 kg)  Height: 5\' 6"  (1.676 m)    Comprehensive ROS Pertinent positive and negative noted in HPI   Exam General appearance : Awake, alert, not in any distress. Speech Clear. Not toxic looking HEENT: Atraumatic and Normocephalic, pupils equally reactive to light and accomodation Neck: Supple, no JVD. No cervical lymphadenopathy.   Chest: Good air entry bilaterally, no added sounds  CVS: S1 S2 regular, no murmurs.  Abdomen: Bowel sounds present, Non tender and not distended with no gaurding, rigidity or rebound. Extremities: B/L Lower Ext shows no edema, both legs are warm to touch Neurology: Awake alert, and oriented X 3, CN II-XII intact, Non focal Skin: No Rash  Data Review Lab Results  Component Value Date   HGBA1C 5.5 01/09/2022   HGBA1C 5.5  07/18/2021   HGBA1C 5.8 (H) 10/18/2020    Assessment & Plan  Aleyda was seen today for medical management of chronic issues.  Diagnoses and all orders for this visit: Leshae was seen today for medical management of chronic issues.  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    Patient have been counseled extensively about nutrition and exercise. Other issues discussed during this visit include: low cholesterol diet, weight control and daily exercise, foot care, annual eye examinations at Ophthalmology, importance of adherence with medications and regular follow-up. We also discussed long term complications of uncontrolled diabetes and hypertension.   No follow-ups on file.  The patient was given clear instructions to go to ER or return to medical center if symptoms don't improve, worsen or new problems develop. The patient verbalized understanding. The patient was told to call to get lab results if they haven't heard anything in the next week.   This note has been created with  Education officer, environmental. Any transcriptional errors are unintentional.   Grayce Sessions, NP 11/13/2022, 4:31 PM

## 2022-11-14 ENCOUNTER — Other Ambulatory Visit: Payer: Self-pay

## 2022-11-18 DIAGNOSIS — M25641 Stiffness of right hand, not elsewhere classified: Secondary | ICD-10-CM | POA: Diagnosis not present

## 2022-11-21 DIAGNOSIS — M1712 Unilateral primary osteoarthritis, left knee: Secondary | ICD-10-CM | POA: Diagnosis not present

## 2022-11-23 ENCOUNTER — Encounter (INDEPENDENT_AMBULATORY_CARE_PROVIDER_SITE_OTHER): Payer: Self-pay | Admitting: Primary Care

## 2022-11-27 ENCOUNTER — Other Ambulatory Visit: Payer: Self-pay

## 2022-11-27 DIAGNOSIS — Z79899 Other long term (current) drug therapy: Secondary | ICD-10-CM | POA: Diagnosis not present

## 2022-11-27 DIAGNOSIS — M5137 Other intervertebral disc degeneration, lumbosacral region: Secondary | ICD-10-CM | POA: Diagnosis not present

## 2022-11-27 DIAGNOSIS — M5030 Other cervical disc degeneration, unspecified cervical region: Secondary | ICD-10-CM | POA: Diagnosis not present

## 2022-11-27 DIAGNOSIS — E119 Type 2 diabetes mellitus without complications: Secondary | ICD-10-CM | POA: Diagnosis not present

## 2022-11-27 MED ORDER — OXYCODONE-ACETAMINOPHEN 10-325 MG PO TABS
1.0000 | ORAL_TABLET | ORAL | 0 refills | Status: DC
  Filled 2022-11-27: qty 180, 30d supply, fill #0

## 2022-11-27 MED ORDER — PHENTERMINE HCL 37.5 MG PO CAPS
37.5000 mg | ORAL_CAPSULE | Freq: Every day | ORAL | 0 refills | Status: DC
Start: 2022-11-27 — End: 2022-12-26
  Filled 2022-11-27 (×2): qty 30, 30d supply, fill #0

## 2022-12-10 ENCOUNTER — Other Ambulatory Visit: Payer: Self-pay

## 2022-12-11 ENCOUNTER — Other Ambulatory Visit: Payer: Self-pay

## 2022-12-12 ENCOUNTER — Other Ambulatory Visit: Payer: Self-pay

## 2022-12-17 DIAGNOSIS — M65341 Trigger finger, right ring finger: Secondary | ICD-10-CM | POA: Diagnosis not present

## 2022-12-26 ENCOUNTER — Other Ambulatory Visit: Payer: Self-pay

## 2022-12-26 DIAGNOSIS — M5030 Other cervical disc degeneration, unspecified cervical region: Secondary | ICD-10-CM | POA: Diagnosis not present

## 2022-12-26 DIAGNOSIS — M5137 Other intervertebral disc degeneration, lumbosacral region: Secondary | ICD-10-CM | POA: Diagnosis not present

## 2022-12-26 DIAGNOSIS — Z79899 Other long term (current) drug therapy: Secondary | ICD-10-CM | POA: Diagnosis not present

## 2022-12-26 MED ORDER — PHENTERMINE HCL 37.5 MG PO CAPS
37.5000 mg | ORAL_CAPSULE | Freq: Every day | ORAL | 0 refills | Status: DC
  Filled 2022-12-26: qty 30, 30d supply, fill #0

## 2022-12-26 MED ORDER — OXYCODONE-ACETAMINOPHEN 10-325 MG PO TABS
1.0000 | ORAL_TABLET | ORAL | 0 refills | Status: DC
  Filled 2022-12-26: qty 180, 30d supply, fill #0

## 2023-01-02 ENCOUNTER — Other Ambulatory Visit: Payer: Self-pay

## 2023-01-02 ENCOUNTER — Other Ambulatory Visit (INDEPENDENT_AMBULATORY_CARE_PROVIDER_SITE_OTHER): Payer: Self-pay | Admitting: Primary Care

## 2023-01-05 ENCOUNTER — Other Ambulatory Visit: Payer: Self-pay

## 2023-01-05 MED ORDER — LIDOCAINE 5 % EX PTCH
3.0000 | MEDICATED_PATCH | Freq: Every day | CUTANEOUS | 3 refills | Status: DC
  Filled 2023-01-05 – 2023-02-02 (×3): qty 30, 10d supply, fill #0
  Filled 2023-02-14: qty 30, 10d supply, fill #1
  Filled 2023-02-27: qty 30, 10d supply, fill #2
  Filled 2023-03-19: qty 30, 10d supply, fill #3

## 2023-01-12 ENCOUNTER — Other Ambulatory Visit: Payer: Self-pay

## 2023-01-12 ENCOUNTER — Other Ambulatory Visit (INDEPENDENT_AMBULATORY_CARE_PROVIDER_SITE_OTHER): Payer: Self-pay | Admitting: Primary Care

## 2023-01-12 NOTE — Telephone Encounter (Signed)
Will forward to provider  

## 2023-01-13 MED ORDER — QUETIAPINE FUMARATE 25 MG PO TABS
25.0000 mg | ORAL_TABLET | Freq: Every day | ORAL | 1 refills | Status: DC
  Filled 2023-01-13: qty 30, 30d supply, fill #0
  Filled 2023-02-17 (×2): qty 30, 30d supply, fill #1

## 2023-01-14 ENCOUNTER — Other Ambulatory Visit: Payer: Self-pay

## 2023-01-15 ENCOUNTER — Ambulatory Visit (INDEPENDENT_AMBULATORY_CARE_PROVIDER_SITE_OTHER): Payer: 59 | Admitting: Primary Care

## 2023-01-15 ENCOUNTER — Encounter (INDEPENDENT_AMBULATORY_CARE_PROVIDER_SITE_OTHER): Payer: Self-pay | Admitting: Primary Care

## 2023-01-15 ENCOUNTER — Other Ambulatory Visit: Payer: Self-pay

## 2023-01-15 VITALS — BP 104/73 | HR 84 | Resp 16 | Ht 65.0 in | Wt 211.8 lb

## 2023-01-15 DIAGNOSIS — I959 Hypotension, unspecified: Secondary | ICD-10-CM | POA: Diagnosis not present

## 2023-01-15 DIAGNOSIS — Z9884 Bariatric surgery status: Secondary | ICD-10-CM

## 2023-01-15 DIAGNOSIS — Z6841 Body Mass Index (BMI) 40.0 and over, adult: Secondary | ICD-10-CM

## 2023-01-15 DIAGNOSIS — R7303 Prediabetes: Secondary | ICD-10-CM

## 2023-01-15 DIAGNOSIS — E538 Deficiency of other specified B group vitamins: Secondary | ICD-10-CM | POA: Diagnosis not present

## 2023-01-15 MED ORDER — MOUNJARO 12.5 MG/0.5ML ~~LOC~~ SOAJ: 15.0000 mg | SUBCUTANEOUS | 4 refills | Status: DC

## 2023-01-15 MED ORDER — CLOTRIMAZOLE-BETAMETHASONE 1-0.05 % EX CREA
1.0000 | TOPICAL_CREAM | Freq: Every day | CUTANEOUS | 0 refills | Status: DC
  Filled 2023-01-15: qty 30, 30d supply, fill #0

## 2023-01-16 LAB — HEMOGLOBIN A1C
Est. average glucose Bld gHb Est-mCnc: 105 mg/dL
Hgb A1c MFr Bld: 5.3 % (ref 4.8–5.6)

## 2023-01-16 LAB — CMP14+EGFR
ALT: 21 [IU]/L (ref 0–32)
AST: 23 [IU]/L (ref 0–40)
Albumin: 3.7 g/dL — ABNORMAL LOW (ref 3.8–4.9)
Alkaline Phosphatase: 72 [IU]/L (ref 44–121)
BUN/Creatinine Ratio: 20 (ref 9–23)
BUN: 19 mg/dL (ref 6–24)
Bilirubin Total: 0.2 mg/dL (ref 0.0–1.2)
CO2: 23 mmol/L (ref 20–29)
Calcium: 8.6 mg/dL — ABNORMAL LOW (ref 8.7–10.2)
Chloride: 104 mmol/L (ref 96–106)
Creatinine, Ser: 0.94 mg/dL (ref 0.57–1.00)
Globulin, Total: 2 g/dL (ref 1.5–4.5)
Glucose: 94 mg/dL (ref 70–99)
Potassium: 4.3 mmol/L (ref 3.5–5.2)
Sodium: 143 mmol/L (ref 134–144)
Total Protein: 5.7 g/dL — ABNORMAL LOW (ref 6.0–8.5)
eGFR: 72 mL/min/{1.73_m2} (ref >59)

## 2023-01-16 LAB — CBC WITH DIFFERENTIAL/PLATELET
Basophils Absolute: 0 10*3/uL (ref 0.0–0.2)
Basos: 0 %
EOS (ABSOLUTE): 0.1 10*3/uL (ref 0.0–0.4)
Eos: 2 %
Hematocrit: 41.3 % (ref 34.0–46.6)
Hemoglobin: 13.3 g/dL (ref 11.1–15.9)
Immature Grans (Abs): 0 10*3/uL (ref 0.0–0.1)
Immature Granulocytes: 0 %
Lymphocytes Absolute: 2.6 10*3/uL (ref 0.7–3.1)
Lymphs: 39 %
MCH: 28.4 pg (ref 26.6–33.0)
MCHC: 32.2 g/dL (ref 31.5–35.7)
MCV: 88 fL (ref 79–97)
Monocytes Absolute: 0.6 10*3/uL (ref 0.1–0.9)
Monocytes: 8 %
Neutrophils Absolute: 3.5 10*3/uL (ref 1.4–7.0)
Neutrophils: 51 %
Platelets: 226 10*3/uL (ref 150–450)
RBC: 4.69 x10E6/uL (ref 3.77–5.28)
RDW: 14.9 % (ref 11.7–15.4)
WBC: 6.8 10*3/uL (ref 3.4–10.8)

## 2023-01-16 LAB — LIPID PANEL
Chol/HDL Ratio: 2.7 {ratio} (ref 0.0–4.4)
Cholesterol, Total: 159 mg/dL (ref 100–199)
HDL: 60 mg/dL (ref >39)
LDL Chol Calc (NIH): 82 mg/dL (ref 0–99)
Triglycerides: 94 mg/dL (ref 0–149)
VLDL Cholesterol Cal: 17 mg/dL (ref 5–40)

## 2023-01-16 LAB — MICROALBUMIN / CREATININE URINE RATIO
Creatinine, Urine: 298.4 mg/dL
Microalb/Creat Ratio: 3 mg/g{creat} (ref 0–29)
Microalbumin, Urine: 9.5 ug/mL

## 2023-01-16 LAB — VITAMIN B12: Vitamin B-12: 721 pg/mL (ref 232–1245)

## 2023-01-16 MED ORDER — TIRZEPATIDE 15 MG/0.5ML ~~LOC~~ SOAJ
15.0000 mg | SUBCUTANEOUS | 4 refills | Status: DC
Start: 2023-01-16 — End: 2024-02-11
  Filled 2023-02-09: qty 2, 28d supply, fill #0
  Filled 2023-03-09: qty 2, 28d supply, fill #1
  Filled 2023-04-09: qty 2, 28d supply, fill #2
  Filled 2023-05-07: qty 2, 28d supply, fill #3
  Filled 2023-06-08: qty 2, 28d supply, fill #4
  Filled 2023-07-03: qty 2, 28d supply, fill #5
  Filled 2023-07-31: qty 2, 28d supply, fill #6
  Filled 2023-08-28: qty 2, 28d supply, fill #7
  Filled 2023-09-23: qty 2, 28d supply, fill #8
  Filled 2023-10-19: qty 2, 28d supply, fill #9
  Filled 2023-11-16: qty 2, 28d supply, fill #10
  Filled 2023-12-11: qty 2, 28d supply, fill #11
  Filled 2024-01-08: qty 2, 28d supply, fill #12

## 2023-01-18 NOTE — Progress Notes (Signed)
[facility]  Angelica Ortiz, is a 57 y.o. female  [id number]  [id number]  DOB - 01-04-1966  Chief Complaint  Patient presents with   Prediabetes       Subjective:   Angelica Ortiz is a 57 y.o. female here today for a follow up visit. Weight loss management . Patient has No headache, No chest pain, No abdominal pain - No Nausea, No new weakness tingling or numbness, No Cough - shortness of breath  No problems updated.  Allergies  Allergen Reactions   Dilaudid [Hydromorphone Hcl] Hives and Itching   Iodinated Contrast Media Hives and Itching    Pt given 4 hour prep 06/21/20, c/o itching after injection- [facility]   Levaquin [Levofloxacin] Anaphylaxis   Tangerine Flavor Swelling    Tangerine fruit: Lip and tongue swelling with tangerines   Ace Inhibitors Swelling   Iodine-131 Itching and Rash    Past Medical History:  Diagnosis Date   Anemia    Arthritis    Asthma    Depression    Diabetes mellitus without complication (HCC)    Type 2   Diverticulitis    Falls    Fibromyalgia    Gait instability    GERD (gastroesophageal reflux disease)    History of urinary tract infection    Hypertension    Lupus    no meds   Migraine    Morbid obesity 03/30/2013   Overactive bladder    Overactive bladder    Pulmonary embolism (HCC)    Resolved -At age 52 broken right leg, blood clot broke off - into lung   Seasonal allergies    Sleep apnea 06/05/2013   Does not have CPAP machine   Urinary incontinence     Current Outpatient Medications on File Prior to Visit  Medication Sig Dispense Refill   albuterol (VENTOLIN HFA) 108 (90 Base) MCG/ACT inhaler Inhale 2 puffs into the lungs every 6 (six) hours as needed for shortness of breath or wheezing. 18 g 2   BIOTIN PO Take 1 tablet by mouth daily.     brimonidine (ALPHAGAN) 0.2 % ophthalmic solution 3 (three) times daily.     budesonide-formoterol (SYMBICORT) 160-4.5 MCG/ACT inhaler Inhale 2 puffs into the lungs daily.  10.2 g 3   CALCIUM PO Take 1 tablet by mouth daily.     cyclobenzaprine (FLEXERIL) 10 MG tablet Take 1 tablet by mouth once daily as needed for severe muscle spasms 30 tablet 2   diclofenac Sodium (VOLTAREN) 1 % GEL Apply 2 g topically daily as needed. 350 g 1   DULoxetine (CYMBALTA) 30 MG capsule Take 30 mg by mouth daily.     ELDERBERRY PO Take 1 tablet by mouth daily.     fluticasone (FLONASE) 50 MCG/ACT nasal spray Place 1 spray into both nostrils in the morning. 18 mL 3   Fremanezumab-vfrm (AJOVY) 225 MG/1.5ML SOAJ Inject 225 mg into the skin every 30 (thirty) days. 3 mL 0   Fremanezumab-vfrm (AJOVY) 225 MG/1.5ML SOAJ Inject 225 mg into the skin every 30 (thirty) days. 1.5 mL 11   lidocaine (LIDODERM) 5 % Place 3 patches onto the skin daily. 30 patch 3   MAGNESIUM PO Take 1 tablet by mouth daily.     Multiple Vitamin (MULTIVITAMIN WITH MINERALS) TABS tablet Take 1 tablet by mouth 2 (two) times daily.     naloxone (NARCAN) nasal spray 4 mg/0.1 mL 1 spray every 3 minutes; spray 1 dose into ONE nostril; alternate nostrils w each dose  until help arrives 2 each 1   NURTEC 75 MG TBDP Take 1 tablet (75 mg total) by mouth daily as needed (migraine). 48 tablet 4   oxyCODONE-acetaminophen (PERCOCET) 10-325 MG tablet Take 1 tablet by mouth every 4 (four) to 6 (six)  hours as needed for pain. 180 tablet 0   oxyCODONE-acetaminophen (PERCOCET) 10-325 MG tablet Take 1 tablet by mouth every 4 to 6 hours as needed for pain. 180 tablet 0   oxyCODONE-acetaminophen (PERCOCET) 10-325 MG tablet Take 1 tablet by mouth every 4-6 hours as needed for pain. 180 tablet 0   phentermine 37.5 MG capsule Take 1 capsule (37.5 mg total) by mouth daily. 30 capsule 0   pregabalin (LYRICA) 50 MG capsule Take 1 capsule (50 mg total) by mouth 2 (two) times daily. 180 capsule 1   QUEtiapine (SEROQUEL) 25 MG tablet Take 1 tablet (25 mg total) by mouth at bedtime. 30 tablet 1   vitamin B-12 (CYANOCOBALAMIN) 100 MCG tablet Take 100  mcg by mouth daily.     No current facility-administered medications on file prior to visit.    Objective:   Vitals:   01/15/23 1423 01/15/23 1426  BP: (!) 90/59 104/73  Pulse: 84   Resp: 16   SpO2: 100%   Weight: 211 lb 12.8 oz (96.1 kg)   Height: 5\' 5"  (1.651 m)     Comprehensive ROS Pertinent positive and negative noted in HPI   Exam General appearance : Awake, alert, not in any distress. Speech Clear. Not toxic looking HEENT: Atraumatic and Normocephalic, pupils equally reactive to light and accomodation Neck: Supple, no JVD. No cervical lymphadenopathy.  Chest: Good air entry bilaterally, no added sounds  CVS: S1 S2 regular, no murmurs.  Abdomen: Bowel sounds present, Non tender and not distended with no gaurding, rigidity or rebound. Extremities: B/L Lower Ext shows no edema, both legs are warm to touch Neurology: Awake alert, and oriented X 3, CN II-XII intact, Non focal Skin: No Rash  Data Review Lab Results  Component Value Date   HGBA1C 5.3 01/15/2023   HGBA1C 5.5 01/09/2022   HGBA1C 5.5 07/18/2021    Assessment & Plan   Angelica Ortiz was seen today for prediabetes.  Diagnoses and all orders for this visit:  Prediabetes -     Microalbumin / creatinine urine ratio -     Lipid panel -     Hemoglobin A1c -     tirzepatide (MOUNJARO) 15 MG/0.5ML Pen; Inject 15 mg into the skin once a week.  Morbid obesity -     tirzepatide (MOUNJARO) 15 MG/0.5ML Pen; Inject 15 mg into the skin once a week.  B12 deficiency 2/2 Gastric bypass status for obesity -     Vitamin B12  Hypotension, unspecified hypotension type -     Ambulatory referral to Cardiology -     CBC with Differential/Platelet -     CMP14+EGFR -     EKG 12-Lead   Other orders -     Discontinue: tirzepatide (MOUNJARO) 12.5 MG/0.5ML Pen; Inject 15 mg into the skin once a week. -     clotrimazole-betamethasone (LOTRISONE) cream; Apply 1 Application topically daily.     Patient have been counseled  extensively about nutrition and exercise. Other issues discussed during this visit include: low cholesterol diet, weight control and daily exercise, foot care, annual eye examinations at Ophthalmology, importance of adherence with medications and regular follow-up. We also discussed long term complications of uncontrolled diabetes and hypertension.   No  follow-ups on file.  The patient was given clear instructions to go to ER or return to medical center if symptoms don't improve, worsen or new problems develop. The patient verbalized understanding. The patient was told to call to get lab results if they haven't heard anything in the next week.   This note has been created with Education officer, environmental. Any transcriptional errors are unintentional.   Grayce Sessions, NP 01/18/2023, 8:14 PM

## 2023-01-21 ENCOUNTER — Other Ambulatory Visit: Payer: Self-pay

## 2023-01-28 ENCOUNTER — Telehealth: Payer: Self-pay | Admitting: Pharmacist

## 2023-01-28 ENCOUNTER — Other Ambulatory Visit: Payer: Self-pay

## 2023-01-28 ENCOUNTER — Ambulatory Visit: Payer: 59 | Attending: Primary Care | Admitting: Pharmacist

## 2023-01-28 DIAGNOSIS — E118 Type 2 diabetes mellitus with unspecified complications: Secondary | ICD-10-CM

## 2023-01-28 MED ORDER — ROSUVASTATIN CALCIUM 5 MG PO TABS
5.0000 mg | ORAL_TABLET | Freq: Every day | ORAL | 2 refills | Status: DC
  Filled 2023-01-28: qty 90, 90d supply, fill #0
  Filled 2023-04-24: qty 90, 90d supply, fill #1
  Filled 2023-07-22: qty 90, 90d supply, fill #2

## 2023-01-28 NOTE — Telephone Encounter (Signed)
Pharmacy Quality Measure Review  This patient is appearing on report for being at risk of failing the measure for Statin Use in Persons with Diabetes (SUPD) medications this calendar year.   It does not look like she has tried a statin before. Called and spoke with her. She is amenable to starting rosuvastatin today. See televisit encounter for complete documentation.   Butch Penny, PharmD, [place], CPP Clinical Pharmacist [facility] [phone]

## 2023-01-28 NOTE — Progress Notes (Signed)
   01/28/2023 Name: Angelica Ortiz MRN: [id number] DOB: [birth year]  No chief complaint on file.   Angelica Ortiz is a 57 y.o. year old female who presented for a telephone visit.   They were referred to the pharmacist by a quality report for assistance in managing diabetes and hyperlipidemia.    Subjective:  Care Team: Primary Care Provider: Grayce Sessions, NP  Medication Access/Adherence  Current Pharmacy:  [facility] [address], [address] [place] [address] Phone: [phone] Fax: [phone]  Patient reports affordability concerns with their medications: No  Patient reports access/transportation concerns to their pharmacy: No  Patient reports adherence concerns with their medications:  No    Pharmacy Quality Measure Review  This patient is appearing on report for being at risk of failing the measure for Statin Use in Persons with Diabetes (SUPD) medications this calendar year.   Prior trials of: none  Objective:  Lab Results  Component Value Date   HGBA1C 5.3 01/15/2023    Lab Results  Component Value Date   CREATININE 0.94 01/15/2023   BUN 19 01/15/2023   NA 143 01/15/2023   K 4.3 01/15/2023   CL 104 01/15/2023   CO2 23 01/15/2023    Lab Results  Component Value Date   CHOL 159 01/15/2023   HDL 60 01/15/2023   LDLCALC 82 01/15/2023   TRIG 94 01/15/2023   CHOLHDL 2.7 01/15/2023    Medications Reviewed Today   Medications were not reviewed in this encounter       Assessment/Plan:   Hyperlipidemia/ASCVD Risk Reduction: - Currently close to LDL goal of < 70. Amenable to starting therapy today with rosuvastatin.  -ASCVD risk is not >20%, will start with moderate intensity statin therapy today.  -Start rosuvastatin 5 mg daily.   Follow Up Plan: with PCP.    Butch Penny, PharmD, Patsy [place], CPP Clinical Pharmacist [facility] [phone]

## 2023-01-29 ENCOUNTER — Other Ambulatory Visit: Payer: Self-pay

## 2023-01-29 MED ORDER — OXYCODONE-ACETAMINOPHEN 10-325 MG PO TABS
1.0000 | ORAL_TABLET | ORAL | 0 refills | Status: DC | PRN
  Filled 2023-01-29: qty 180, 30d supply, fill #0

## 2023-01-29 MED ORDER — PHENTERMINE HCL 37.5 MG PO CAPS
37.5000 mg | ORAL_CAPSULE | Freq: Every day | ORAL | 0 refills | Status: DC
Start: 2023-01-29 — End: 2023-02-26
  Filled 2023-01-29 (×2): qty 30, 30d supply, fill #0

## 2023-02-02 ENCOUNTER — Other Ambulatory Visit: Payer: Self-pay

## 2023-02-02 MED ORDER — LINZESS 145 MCG PO CAPS
145.0000 ug | ORAL_CAPSULE | Freq: Every day | ORAL | 2 refills | Status: DC
  Filled 2023-02-02: qty 30, 30d supply, fill #0
  Filled 2023-03-06: qty 30, 30d supply, fill #1
  Filled 2023-04-09: qty 30, 30d supply, fill #2

## 2023-02-03 ENCOUNTER — Telehealth: Payer: Self-pay

## 2023-02-03 ENCOUNTER — Other Ambulatory Visit: Payer: Self-pay

## 2023-02-03 NOTE — Telephone Encounter (Signed)
Goodmorning, this patients Ajovy requires a PA to be started via Covermymeds. I let the patient know that it can take up to 24-72hrs or a week before hearing back about an approval or denial & to follow up with your office for any questions about the status of it. Thank you!

## 2023-02-04 ENCOUNTER — Other Ambulatory Visit (HOSPITAL_COMMUNITY): Payer: Self-pay

## 2023-02-04 ENCOUNTER — Telehealth: Payer: Self-pay

## 2023-02-04 NOTE — Telephone Encounter (Signed)
PA request has been Submitted. New Encounter created for follow up. For additional info see Pharmacy Prior Auth telephone encounter from 02/04/2023.

## 2023-02-04 NOTE — Telephone Encounter (Signed)
Pharmacy Patient Advocate Encounter   Received notification from  Secure Chat Message  that prior authorization for AJOVY (fremanezumab-vfrm) injection 225MG /1.5ML auto-injectors is required/requested.   Insurance verification completed.   The patient is insured through [facility] .   Per test claim: PA required; PA submitted to above mentioned insurance via CoverMyMeds Key/confirmation #/EOC [id number] Status is pending

## 2023-02-05 ENCOUNTER — Other Ambulatory Visit: Payer: Self-pay

## 2023-02-09 ENCOUNTER — Other Ambulatory Visit: Payer: Self-pay

## 2023-02-11 ENCOUNTER — Other Ambulatory Visit (HOSPITAL_COMMUNITY): Payer: Self-pay

## 2023-02-11 ENCOUNTER — Telehealth: Payer: Self-pay

## 2023-02-11 NOTE — Telephone Encounter (Signed)
Pharmacy Patient Advocate Encounter  Received notification from [facility] that Prior Authorization for AJOVY (fremanezumab-vfrm) injection 225MG /1.5ML auto-injectors has been APPROVED from 02/04/2023 to 04/06/2024   PA #/Case ID/Reference #: PA Case ID #: [id number]

## 2023-02-11 NOTE — Telephone Encounter (Signed)
Pharmacy Patient Advocate Encounter   Received notification from Physician's Office that prior authorization for AJOVY (fremanezumab-vfrm) injection 225MG /1.5ML auto-injectors is required/requested.   Insurance verification completed.   The patient is insured through [facility] .   Per test claim: PA required; PA submitted to above mentioned insurance via CoverMyMeds Key/confirmation #/EOC [id number] Status is pending

## 2023-02-17 ENCOUNTER — Other Ambulatory Visit: Payer: Self-pay

## 2023-02-18 NOTE — Telephone Encounter (Signed)
 Pharmacy Patient Advocate Encounter  Received notification from [facility] that Prior Authorization for AJOVY (fremanezumab-vfrm) injection 225MG /1.5ML auto-injectors has been APPROVED from 02/04/2023 to 04/06/2024   PA #/Case ID/Reference #: PA Case ID #: [id number]

## 2023-02-19 ENCOUNTER — Other Ambulatory Visit: Payer: Self-pay

## 2023-02-26 ENCOUNTER — Other Ambulatory Visit: Payer: Self-pay

## 2023-02-26 MED ORDER — OXYCODONE-ACETAMINOPHEN 10-325 MG PO TABS
1.0000 | ORAL_TABLET | Freq: Four times a day (QID) | ORAL | 0 refills | Status: DC | PRN
  Filled 2023-02-26 – 2023-02-27 (×2): qty 180, 30d supply, fill #0

## 2023-02-26 MED ORDER — PHENTERMINE HCL 37.5 MG PO CAPS
37.5000 mg | ORAL_CAPSULE | Freq: Every day | ORAL | 0 refills | Status: DC
  Filled 2023-02-26: qty 30, 30d supply, fill #0

## 2023-02-27 ENCOUNTER — Other Ambulatory Visit: Payer: Self-pay

## 2023-03-06 ENCOUNTER — Other Ambulatory Visit: Payer: Self-pay

## 2023-03-09 ENCOUNTER — Ambulatory Visit: Payer: 59 | Attending: Cardiology | Admitting: Cardiology

## 2023-03-09 ENCOUNTER — Encounter: Payer: Self-pay | Admitting: Cardiology

## 2023-03-09 ENCOUNTER — Other Ambulatory Visit: Payer: Self-pay

## 2023-03-09 VITALS — BP 114/88 | HR 78 | Ht 66.0 in | Wt 208.0 lb

## 2023-03-09 DIAGNOSIS — Z01812 Encounter for preprocedural laboratory examination: Secondary | ICD-10-CM | POA: Diagnosis not present

## 2023-03-09 DIAGNOSIS — E119 Type 2 diabetes mellitus without complications: Secondary | ICD-10-CM

## 2023-03-09 DIAGNOSIS — R072 Precordial pain: Secondary | ICD-10-CM

## 2023-03-09 DIAGNOSIS — R0609 Other forms of dyspnea: Secondary | ICD-10-CM

## 2023-03-09 DIAGNOSIS — I10 Essential (primary) hypertension: Secondary | ICD-10-CM

## 2023-03-09 DIAGNOSIS — Z794 Long term (current) use of insulin: Secondary | ICD-10-CM

## 2023-03-09 DIAGNOSIS — E782 Mixed hyperlipidemia: Secondary | ICD-10-CM

## 2023-03-09 MED ORDER — METOPROLOL TARTRATE 100 MG PO TABS
ORAL_TABLET | ORAL | 0 refills | Status: DC
  Filled 2023-03-09: qty 1, 1d supply, fill #0

## 2023-03-09 MED ORDER — PREDNISONE 50 MG PO TABS
ORAL_TABLET | ORAL | 0 refills | Status: DC
  Filled 2023-03-09: qty 3, 2d supply, fill #0

## 2023-03-09 NOTE — Patient Instructions (Addendum)
Medication Instructions:  Your physician recommends that you continue on your current medications as directed. Please refer to the Current Medication list given to you today.  *If you need a refill on your cardiac medications before your next appointment, please call your pharmacy*   Lab Work: CMET, MAG If you have labs (blood work) drawn today and your tests are completely normal, you will receive your results only by: MyChart Message (if you have MyChart) OR A paper copy in the mail If you have any lab test that is abnormal or we need to change your treatment, we will call you to review the results.   Testing/Procedures: Your physician has requested that you have an echocardiogram. Echocardiography is a painless test that uses sound waves to create images of your heart. It provides your doctor with information about the size and shape of your heart and how well your heart's chambers and valves are working. This procedure takes approximately one hour. There are no restrictions for this procedure. Please do NOT wear cologne, perfume, aftershave, or lotions (deodorant is allowed). Please arrive 15 minutes prior to your appointment time.  Please note: We ask at that you not bring children with you during ultrasound (echo/ vascular) testing. Due to room size and safety concerns, children are not allowed in the ultrasound rooms during exams. Our front office staff cannot provide observation of children in our lobby area while testing is being conducted. An adult accompanying a patient to their appointment will only be allowed in the ultrasound room at the discretion of the ultrasound technician under special circumstances. We apologize for any inconvenience.    Your cardiac CT will be scheduled at one of the below locations:   [facility] [address] [place], [place] [address] [phone]  If scheduled at [facility], please arrive at the [facility] and Children's  Entrance (Entrance C2) of [facility] 30 minutes prior to test start time. You can use the FREE valet parking offered at entrance C (encouraged to control the heart rate for the test)  Proceed to the [facility] Radiology Department (first floor) to check-in and test prep.  All radiology patients and guests should use entrance C2 at [facility], accessed from [facility], even though the hospital's physical address listed is [address]      Please follow these instructions carefully (unless otherwise directed):  An IV will be required for this test and Nitroglycerin will be given.   On the Night Before the Test: Be sure to Drink plenty of water. Do not consume any caffeinated/decaffeinated beverages or chocolate 12 hours prior to your test. Do not take any antihistamines 12 hours prior to your test. If the patient has contrast allergy: Patient will need a prescription for Prednisone and very clear instructions (as follows): Prednisone 50 mg - take 13 hours prior to test Take another Prednisone 50 mg 7 hours prior to test Take another Prednisone 50 mg 1 hour prior to test Take Benadryl 50 mg 1 hour prior to test Patient must complete all four doses of above prophylactic medications. Patient will need a ride after test due to Benadryl.  On the Day of the Test: Drink plenty of water until 1 hour prior to the test. Do not eat any food 1 hour prior to test. You may take your regular medications prior to the test.  Take metoprolol (Lopressor) two hours prior to test. If you take Furosemide/Hydrochlorothiazide/Spironolactone, please HOLD on  the morning of the test. FEMALES- please wear underwire-free bra if available, avoid dresses & tight clothing       After the Test: Drink plenty of water. After receiving IV contrast, you may experience a mild flushed feeling. This is normal. On occasion, you may experience a mild rash up to 24 hours after  the test. This is not dangerous. If this occurs, you can take Benadryl 25 mg and increase your fluid intake. If you experience trouble breathing, this can be serious. If it is severe call 911 IMMEDIATELY. If it is mild, please call our office.  We will call to schedule your test 2-4 weeks out understanding that some insurance companies will need an authorization prior to the service being performed.   For more information and frequently asked questions, please visit our website : [email or web address]  For non-scheduling related questions, please contact the cardiac imaging nurse navigator should you have any questions/concerns: Cardiac Imaging Nurse Navigators Direct Office Dial: [phone]   For scheduling needs, including cancellations and rescheduling, please call [place], [phone].    Follow-Up: At [facility], you and your health needs are our priority.  As part of our continuing mission to provide you with exceptional heart care, we have created designated Provider Care Teams.  These Care Teams include your primary Cardiologist (physician) and Advanced Practice Providers (APPs -  Physician Assistants and Nurse Practitioners) who all work together to provide you with the care you need, when you need it.  Your next appointment:   12 week(s)  Provider:   Thomasene Ripple, DO    Other instructions: You are invited to attend: [facility] event on February Friday 7th 2025 at [facility] ([address], [place], [place] [address]) from 8am-12pm. Feel free to invite other women to attend!  See you there!

## 2023-03-09 NOTE — Progress Notes (Unsigned)
Cardiology Office Note:    Date:  03/12/2023   ID:  Angelica Ortiz, DOB Aug 29, 1965, MRN [id number]  PCP:  Grayce Sessions, NP  Cardiologist:  Thomasene Ripple, DO  Electrophysiologist:  None   Referring MD: Grayce Sessions, NP    History of Present Illness:    Angelica Ortiz is a 57 y.o. female with a hx of type 2 diabetes, asthma, GERD, hypertension, hyperlipidemia, history of lupus, OSA on CPAP, history of pulmonary embolism no longer anticoagulation.  She reports intermittent chest pain and shortness of breath for the past two months. The chest pain is described as electric shock-like sensations, varying in intensity, and radiating from the left side across the chest. The pain episodes last for about five to ten minutes and are not associated with any specific activity, although she occurs more frequently than before. The patient also reports episodes of sudden shortness of breath and dizziness, even when at rest, leading to a few falls. The patient has lost significant weight (100 pounds) recently, which has improved her ability to climb stairs. However, she still experiences chest pain occasionally during this activity. The patient also reports feeling dizzy and short of breath while moving boxes during house cleaning. The patient's diabetes has been well-controlled for the past six to seven years.  Past Medical History:  Diagnosis Date   Anemia    Arthritis    Asthma    Depression    Diabetes mellitus without complication (HCC)    Type 2   Diverticulitis    Falls    Fibromyalgia    Gait instability    GERD (gastroesophageal reflux disease)    History of urinary tract infection    Hypertension    Lupus    no meds   Migraine    Morbid obesity 03/30/2013   Overactive bladder    Overactive bladder    Pulmonary embolism (HCC)    Resolved -At age 55 broken right leg, blood clot broke off - into lung   Seasonal allergies    Sleep apnea 06/05/2013   Does not have CPAP machine    Urinary incontinence     Past Surgical History:  Procedure Laterality Date   ABDOMINAL HYSTERECTOMY     APPENDECTOMY     CESAREAN SECTION  04/08/1999   x 1   CHOLECYSTECTOMY     DILATION AND CURETTAGE OF UTERUS     x 3 for AUB   GASTRIC ROUX-EN-Y N/A 03/13/2015   Procedure: LAPAROSCOPIC ROUX-EN-Y GASTRIC BYPASS  ENTEROLYSIS OF ADHESIONS WITH UPPER ENDOSCOPY;  Surgeon: Ovidio Kin, MD;  Location: [facility];  Service: General;  Laterality: N/A;   HYSTEROSCOPY WITH NOVASURE N/A 03/14/2014   Procedure: HYSTEROSCOPY WITH NOVASURE;  Surgeon: Willodean Rosenthal, MD;  Location: [facility];  Service: Gynecology;  Laterality: N/A;   LAMINECTOMY     L4-L5   LAPAROSCOPIC LYSIS OF ADHESIONS  03/13/2015   Procedure: LAPAROSCOPIC LYSIS OF ADHESIONS;  Surgeon: Ovidio Kin, MD;  Location: [facility];  Service: General;;   LAPAROSCOPIC OOPHERECTOMY Right 09/05/2014   Procedure: abdominal OOPHERECTOMY;  Surgeon: Willodean Rosenthal, MD;  Location: [facility];  Service: Gynecology;  Laterality: Right;   LAPAROSCOPIC OVARIAN CYSTECTOMY Right 04/07/1992   ORIF ANKLE FRACTURE Left    OVARIAN CYST REMOVAL Left 09/05/2014   Procedure: OVARIAN CYSTECTOMY and portion of left ovary removed ;  Surgeon: Willodean Rosenthal, MD;  Location: [facility];  Service: Gynecology;  Laterality: Left;   ROTATOR CUFF REPAIR  right   ROTATOR CUFF REPAIR Right    SALPINGOOPHORECTOMY Bilateral 09/05/2014   Procedure: BILATERAL SALPINGECTOMY;  Surgeon: Willodean Rosenthal, MD;  [facility];  Service: Gynecology;  Laterality: Bilateral;   SHOULDER ARTHROSCOPY WITH ROTATOR CUFF REPAIR AND SUBACROMIAL DECOMPRESSION Left 11/01/2020   Procedure: SHOULDER ARTHROSCOPY WITH ROTATOR CUFF REPAIR AND SUBACROMIAL DECOMPRESSION, DISTAL CLAVICLE EXCISION;  Surgeon: Jones Broom, MD;  Location: [facility];  Service: Orthopedics;  Laterality: Left;   SUPRACERVICAL ABDOMINAL HYSTERECTOMY N/A 09/05/2014   Procedure: HYSTERECTOMY SUPRACERVICAL  ABDOMINAL;  Surgeon: Willodean Rosenthal, MD;  Location[facility];  Service: Gynecology;  Laterality: N/A;   TONSILLECTOMY     UPPER GI ENDOSCOPY  03/13/2015   Procedure: UPPER GI ENDOSCOPY;  Surgeon: Ovidio Kin, MD;  [facility]: [facility];  Service: General;;    Current Medications: Current Meds  Medication Sig   albuterol (VENTOLIN HFA) 108 (90 Base) MCG/ACT inhaler Inhale 2 puffs into the lungs every 6 (six) hours as needed for shortness of breath or wheezing.   BIOTIN PO Take 1 tablet by mouth daily.   brimonidine (ALPHAGAN) 0.2 % ophthalmic solution 3 (three) times daily.   budesonide-formoterol (SYMBICORT) 160-4.5 MCG/ACT inhaler Inhale 2 puffs into the lungs daily.   CALCIUM PO Take 1 tablet by mouth daily.   clotrimazole-betamethasone (LOTRISONE) cream Apply 1 Application topically daily.   cyclobenzaprine (FLEXERIL) 10 MG tablet Take 1 tablet by mouth once daily as needed for severe muscle spasms   diclofenac Sodium (VOLTAREN) 1 % GEL Apply 2 g topically daily as needed.   ELDERBERRY PO Take 1 tablet by mouth daily.   fluticasone (FLONASE) 50 MCG/ACT nasal spray Place 1 spray into both nostrils in the morning.   Fremanezumab-vfrm (AJOVY) 225 MG/1.5ML SOAJ Inject 225 mg into the skin every 30 (thirty) days.   lidocaine (LIDODERM) 5 % Place 3 patches onto the skin daily.   linaclotide (LINZESS) 145 MCG CAPS capsule Take 1 capsule (145 mcg total) by mouth daily.   MAGNESIUM PO Take 1 tablet by mouth daily.   metoprolol tartrate (LOPRESSOR) 100 MG tablet Take 2 hours prior to CT   Multiple Vitamin (MULTIVITAMIN WITH MINERALS) TABS tablet Take 1 tablet by mouth 2 (two) times daily.   naloxone (NARCAN) nasal spray 4 mg/0.1 mL 1 spray every 3 minutes; spray 1 dose into ONE nostril; alternate nostrils w each dose until help arrives   NURTEC 75 MG TBDP Take 1 tablet (75 mg total) by mouth daily as needed (migraine).   oxyCODONE-acetaminophen (PERCOCET) 10-325 MG tablet Take 1 tablet by mouth  every 4 (four) to 6 (six)  hours as needed for pain.   phentermine 37.5 MG capsule Take 1 capsule (37.5 mg total) by mouth daily.   predniSONE (DELTASONE) 50 MG tablet Take 50 mg (one tablet) 13 hours before procedure. Take 50 mg (once tablet) 7 house before procedure. Take 50 mg (one tablet) before leaving home before procedure.   pregabalin (LYRICA) 50 MG capsule Take 1 capsule (50 mg total) by mouth 2 (two) times daily.   QUEtiapine (SEROQUEL) 25 MG tablet Take 1 tablet (25 mg total) by mouth at bedtime.   rosuvastatin (CRESTOR) 5 MG tablet Take 1 tablet (5 mg total) by mouth daily.   tirzepatide (MOUNJARO) 15 MG/0.5ML Pen Inject 15 mg into the skin once a week.   vitamin B-12 (CYANOCOBALAMIN) 100 MCG tablet Take 100 mcg by mouth daily.   [DISCONTINUED] Fremanezumab-vfrm (AJOVY) 225 MG/1.5ML SOAJ Inject 225 mg into the skin every 30 (thirty)  days.   [DISCONTINUED] oxyCODONE-acetaminophen (PERCOCET) 10-325 MG tablet Take 1 tablet by mouth every 4 to 6 hours as needed for pain.   [DISCONTINUED] oxyCODONE-acetaminophen (PERCOCET) 10-325 MG tablet Take 1 tablet by mouth every 4-6 hours as needed for pain.   [DISCONTINUED] oxyCODONE-acetaminophen (PERCOCET) 10-325 MG tablet Take 1 tablet by mouth every 4-6 hours as needed.     Allergies:   Dilaudid [hydromorphone hcl], Iodinated contrast media, Levaquin [levofloxacin], Tangerine flavor, Ace inhibitors, and Iodine-131   Social History   Socioeconomic History   Marital status: Divorced    Spouse name: Not on file   Number of children: Not on file   Years of education: Not on file   Highest education level: Not on file  Occupational History   Not on file  Tobacco Use   Smoking status: Never   Smokeless tobacco: Never  Vaping Use   Vaping status: Never Used  Substance and Sexual Activity   Alcohol use: No   Drug use: No   Sexual activity: Not Currently    Birth control/protection: None  Other Topics Concern   Not on file  Social  History Narrative   Not on file   Social Determinants of Health   Financial Resource Strain: Low Risk  (11/29/2021)   Overall Financial Resource Strain (CARDIA)    Difficulty of Paying Living Expenses: Not hard at all  Food Insecurity: No Food Insecurity (11/29/2021)   Hunger Vital Sign    Worried About Running Out of Food in the Last Year: Never true    Ran Out of Food in the Last Year: Never true  Transportation Needs: Unmet Transportation Needs (11/29/2021)   PRAPARE - Transportation    Lack of Transportation (Medical): Yes    Lack of Transportation (Non-Medical): Yes  Physical Activity: Inactive (11/29/2021)   Exercise Vital Sign    Days of Exercise per Week: 0 days    Minutes of Exercise per Session: 0 min  Stress: No Stress Concern Present (11/29/2021)   [employer] of Occupational Health - Occupational Stress Questionnaire    Feeling of Stress : Not at all  Social Connections: Moderately Integrated (11/29/2021)   Social Connection and Isolation Panel [NHANES]    Frequency of Communication with Friends and Family: More than three times a week    Frequency of Social Gatherings with Friends and Family: More than three times a week    Attends Religious Services: More than 4 times per year    Active Member of [employer] or Organizations: Yes    Attends Engineer, structural: More than 4 times per year    Marital Status: Divorced     Family History: The patient's family history includes Cancer in her father, maternal uncle, paternal uncle, and sister; Diabetes in her brother and mother; Heart disease in her father and mother; Hypertension in her brother, mother, and sister. There is no history of Breast cancer.  ROS:   Review of Systems  Constitution: Negative for decreased appetite, fever and weight gain.  HENT: Negative for congestion, ear discharge, hoarse voice and sore throat.   Eyes: Negative for discharge, redness, vision loss in right eye and visual halos.   Cardiovascular: Negative for chest pain, dyspnea on exertion, leg swelling, orthopnea and palpitations.  Respiratory: Negative for cough, hemoptysis, shortness of breath and snoring.   Endocrine: Negative for heat intolerance and polyphagia.  Hematologic/Lymphatic: Negative for bleeding problem. Does not bruise/bleed easily.  Skin: Negative for flushing, nail changes, rash and suspicious lesions.  Musculoskeletal: Negative for arthritis, joint pain, muscle cramps, myalgias, neck pain and stiffness.  Gastrointestinal: Negative for abdominal pain, bowel incontinence, diarrhea and excessive appetite.  Genitourinary: Negative for decreased libido, genital sores and incomplete emptying.  Neurological: Negative for brief paralysis, focal weakness, headaches and loss of balance.  Psychiatric/Behavioral: Negative for altered mental status, depression and suicidal ideas.  Allergic/Immunologic: Negative for HIV exposure and persistent infections.    EKGs/Labs/Other Studies Reviewed:    The following studies were reviewed today:   EKG:  The ekg ordered today demonstrates   Recent Labs: 05/01/2022: TSH 1.820 01/15/2023: ALT 21; BUN 19; Creatinine, Ser 0.94; Hemoglobin 13.3; Platelets 226; Potassium 4.3; Sodium 143  Recent Lipid Panel    Component Value Date/Time   CHOL 159 01/15/2023 1540   TRIG 94 01/15/2023 1540   HDL 60 01/15/2023 1540   CHOLHDL 2.7 01/15/2023 1540   LDLCALC 82 01/15/2023 1540    Physical Exam:    VS:  BP 114/88 (BP Location: Left Arm, Patient Position: Sitting, Cuff Size: Normal)   Pulse 78   Ht 5\' 6"  (1.676 m)   Wt 208 lb (94.3 kg)   LMP  (LMP Unknown) Comment: full hysterectomy  SpO2 98%   BMI 33.57 kg/m     Wt Readings from Last 3 Encounters:  03/09/23 208 lb (94.3 kg)  01/15/23 211 lb 12.8 oz (96.1 kg)  11/13/22 224 lb 9.6 oz (101.9 kg)     GEN: Well nourished, well developed in no acute distress HEENT: Normal NECK: No JVD; No carotid  bruits LYMPHATICS: No lymphadenopathy CARDIAC: S1S2 noted,RRR, no murmurs, rubs, gallops RESPIRATORY:  Clear to auscultation without rales, wheezing or rhonchi  ABDOMEN: Soft, non-tender, non-distended, +bowel sounds, no guarding. EXTREMITIES: No edema, No cyanosis, no clubbing MUSCULOSKELETAL:  No deformity  SKIN: Warm and dry NEUROLOGIC:  Alert and oriented x 3, non-focal PSYCHIATRIC:  Normal affect, good insight  ASSESSMENT:    1. Precordial pain   2. DOE (dyspnea on exertion)   3. Pre-procedure lab exam   4. Insulin-requiring or dependent type II diabetes mellitus (HCC)   5. Hypertension, well controlled   6. Mixed hyperlipidemia    PLAN:     Atypical Chest Pain Intermittent chest pain, Not consistent associated with exertion. With her risk factors I will order Coronary CTA to assess for coronary artery disease.   Shortness of Breath and Dizziness Episodes of unexplained shortness of breath and dizziness, sometimes leading to falls. Not consistently associated with exertion. History of diabetes. Will get an echocardiogram to assesses LV function.  Follow-up in 12 weeks to discuss results of testing. If any concerning results are found, patient will be contacted sooner.  The patient is in agreement with the above plan. The patient left the office in stable condition.  The patient will follow up in    Medication Adjustments/Labs and Tests Ordered: Current medicines are reviewed at length with the patient today.  Concerns regarding medicines are outlined above.  Orders Placed This Encounter  Procedures   CT CORONARY MORPH W/CTA COR W/SCORE W/CA W/CM &/OR WO/CM   Magnesium   Comprehensive metabolic panel   ECHOCARDIOGRAM COMPLETE   Meds ordered this encounter  Medications   metoprolol tartrate (LOPRESSOR) 100 MG tablet    Sig: Take 2 hours prior to CT    Dispense:  1 tablet    Refill:  0   predniSONE (DELTASONE) 50 MG tablet    Sig: Take 50 mg (one tablet) 13 hours  before procedure.  Take 50 mg (once tablet) 7 house before procedure. Take 50 mg (one tablet) before leaving home before procedure.    Dispense:  3 tablet    Refill:  0    Patient Instructions  Medication Instructions:  Your physician recommends that you continue on your current medications as directed. Please refer to the Current Medication list given to you today.  *If you need a refill on your cardiac medications before your next appointment, please call your pharmacy*   Lab Work: CMET, MAG If you have labs (blood work) drawn today and your tests are completely normal, you will receive your results only by: MyChart Message (if you have MyChart) OR A paper copy in the mail If you have any lab test that is abnormal or we need to change your treatment, we will call you to review the results.   Testing/Procedures: Your physician has requested that you have an echocardiogram. Echocardiography is a painless test that uses sound waves to create images of your heart. It provides your doctor with information about the size and shape of your heart and how well your heart's chambers and valves are working. This procedure takes approximately one hour. There are no restrictions for this procedure. Please do NOT wear cologne, perfume, aftershave, or lotions (deodorant is allowed). Please arrive 15 minutes prior to your appointment time.  Please note: We ask at that you not bring children with you during ultrasound (echo/ vascular) testing. Due to room size and safety concerns, children are not allowed in the ultrasound rooms during exams. Our front office staff cannot provide observation of children in our lobby area while testing is being conducted. An adult accompanying a patient to their appointment will only be allowed in the ultrasound room at the discretion of the ultrasound technician under special circumstances. We apologize for any inconvenience.    Your cardiac CT will be scheduled at one  of the below locations:   [facility] [address] [place], [place] [address] [phone]  If scheduled at [facility], please arrive at the [facility] and Children's Entrance (Entrance C2) of [facility] 30 minutes prior to test start time. You can use the FREE valet parking offered at entrance C (encouraged to control the heart rate for the test)  Proceed to the [facility] Radiology Department (first floor) to check-in and test prep.  All radiology patients and guests should use entrance C2 at [facility], accessed from [facility], even though the hospital's physical address listed is [address].      Please follow these instructions carefully (unless otherwise directed):  An IV will be required for this test and Nitroglycerin will be given.   On the Night Before the Test: Be sure to Drink plenty of water. Do not consume any caffeinated/decaffeinated beverages or chocolate 12 hours prior to your test. Do not take any antihistamines 12 hours prior to your test. If the patient has contrast allergy: Patient will need a prescription for Prednisone and very clear instructions (as follows): Prednisone 50 mg - take 13 hours prior to test Take another Prednisone 50 mg 7 hours prior to test Take another Prednisone 50 mg 1 hour prior to test Take Benadryl 50 mg 1 hour prior to test Patient must complete all four doses of above prophylactic medications. Patient will need a ride after test due to Benadryl.  On the Day of the Test: Drink plenty of water until 1 hour prior to  the test. Do not eat any food 1 hour prior to test. You may take your regular medications prior to the test.  Take metoprolol (Lopressor) two hours prior to test. If you take Furosemide/Hydrochlorothiazide/Spironolactone, please HOLD on the morning of the test. FEMALES- please wear underwire-free bra if available, avoid dresses & tight clothing        After the Test: Drink plenty of water. After receiving IV contrast, you may experience a mild flushed feeling. This is normal. On occasion, you may experience a mild rash up to 24 hours after the test. This is not dangerous. If this occurs, you can take Benadryl 25 mg and increase your fluid intake. If you experience trouble breathing, this can be serious. If it is severe call 911 IMMEDIATELY. If it is mild, please call our office.  We will call to schedule your test 2-4 weeks out understanding that some insurance companies will need an authorization prior to the service being performed.   For more information and frequently asked questions, please visit our website : [email or web address]  For non-scheduling related questions, please contact the cardiac imaging nurse navigator should you have any questions/concerns: Cardiac Imaging Nurse Navigators Direct Office Dial: [phone]   For scheduling needs, including cancellations and rescheduling, please call [place], [phone].    Follow-Up: At [facility], you and your health needs are our priority.  As part of our continuing mission to provide you with exceptional heart care, we have created designated Provider Care Teams.  These Care Teams include your primary Cardiologist (physician) and Advanced Practice Providers (APPs -  Physician Assistants and Nurse Practitioners) who all work together to provide you with the care you need, when you need it.  Your next appointment:   12 week(s)  Provider:   Thomasene Ripple, DO    Other instructions: You are invited to attend: Women's [facility] Community event on February Friday 7th 2025 at [facility] ([address] [place], [place], [place] [address]) from 8am-12pm. Feel free to invite other women to attend!  See you there!    Adopting a Healthy Lifestyle.  Know what a healthy weight is for you (roughly BMI <25) and aim to maintain this   Aim for 7+ servings of fruits and  vegetables daily   65-80+ fluid ounces of water or unsweet tea for healthy kidneys   Limit to max 1 drink of alcohol per day; avoid smoking/tobacco   Limit animal fats in diet for cholesterol and heart health - choose grass fed whenever available   Avoid highly processed foods, and foods high in saturated/trans fats   Aim for low stress - take time to unwind and care for your mental health   Aim for 150 min of moderate intensity exercise weekly for heart health, and weights twice weekly for bone health   Aim for 7-9 hours of sleep daily   When it comes to diets, agreement about the perfect plan isnt easy to find, even among the experts. Experts at the [facility] [employer] developed an idea known as the Healthy Eating Plate. Just imagine a plate divided into logical, healthy portions.   The emphasis is on diet quality:   Load up on vegetables and fruits - one-half of your plate: Aim for color and variety, and remember that potatoes dont count.   Go for whole grains - one-quarter of your plate: Whole wheat, barley, wheat berries, quinoa, oats, brown rice, and foods made with them. If you want pasta, go with  whole wheat pasta.   Protein power - one-quarter of your plate: Fish, chicken, beans, and nuts are all healthy, versatile protein sources. Limit red meat.   The diet, however, does go beyond the plate, offering a few other suggestions.   Use healthy plant oils, such as olive, canola, soy, corn, sunflower and peanut. Check the labels, and avoid partially hydrogenated oil, which have unhealthy trans fats.   If youre thirsty, drink water. Coffee and tea are good in moderation, but skip sugary drinks and limit milk and dairy products to one or two daily servings.   The type of carbohydrate in the diet is more important than the amount. Some sources of carbohydrates, such as vegetables, fruits, whole grains, and beans-are healthier than others.   Finally, stay  active  Signed, Thomasene Ripple, DO  03/12/2023 8:25 AM    [facility]

## 2023-03-13 ENCOUNTER — Other Ambulatory Visit: Payer: Self-pay

## 2023-03-20 ENCOUNTER — Other Ambulatory Visit: Payer: Self-pay

## 2023-03-23 ENCOUNTER — Ambulatory Visit (HOSPITAL_COMMUNITY)
Admission: RE | Admit: 2023-03-23 | Discharge: 2023-03-23 | Disposition: A | Discharge status: Home / Self Care | Payer: 59 | Source: Ambulatory Visit | Attending: Cardiovascular Disease | Admitting: Cardiovascular Disease

## 2023-03-23 DIAGNOSIS — R0609 Other forms of dyspnea: Secondary | ICD-10-CM | POA: Insufficient documentation

## 2023-03-23 LAB — COMPREHENSIVE METABOLIC PANEL
ALT: 23 [IU]/L (ref 0–32)
AST: 26 [IU]/L (ref 0–40)
Albumin: 3.9 g/dL (ref 3.8–4.9)
Alkaline Phosphatase: 82 [IU]/L (ref 44–121)
BUN/Creatinine Ratio: 22 (ref 9–23)
BUN: 15 mg/dL (ref 6–24)
Bilirubin Total: 0.6 mg/dL (ref 0.0–1.2)
CO2: 24 mmol/L (ref 20–29)
Calcium: 9.2 mg/dL (ref 8.7–10.2)
Chloride: 107 mmol/L — ABNORMAL HIGH (ref 96–106)
Creatinine, Ser: 0.67 mg/dL (ref 0.57–1.00)
Globulin, Total: 2.2 g/dL (ref 1.5–4.5)
Glucose: 81 mg/dL (ref 70–99)
Potassium: 3.9 mmol/L (ref 3.5–5.2)
Sodium: 143 mmol/L (ref 134–144)
Total Protein: 6.1 g/dL (ref 6.0–8.5)
eGFR: 103 mL/min/{1.73_m2} (ref >59)

## 2023-03-23 LAB — ECHOCARDIOGRAM COMPLETE
AR max vel: 2.9 cm2
AV Area VTI: 2.62 cm2
AV Area mean vel: 2.65 cm2
AV Mean grad: 5 mm[Hg]
AV Peak grad: 8.8 mm[Hg]
Ao pk vel: 1.48 m/s
Area-P 1/2: 3.48 cm2
MV M vel: 2.44 m/s
MV Peak grad: 23.8 mm[Hg]
S' Lateral: 3.57 cm

## 2023-03-23 LAB — MAGNESIUM: Magnesium: 2.1 mg/dL (ref 1.6–2.3)

## 2023-03-26 ENCOUNTER — Other Ambulatory Visit: Payer: Self-pay

## 2023-03-26 ENCOUNTER — Encounter (HOSPITAL_COMMUNITY): Payer: Self-pay

## 2023-03-26 ENCOUNTER — Other Ambulatory Visit (INDEPENDENT_AMBULATORY_CARE_PROVIDER_SITE_OTHER): Payer: Self-pay | Admitting: Primary Care

## 2023-03-26 ENCOUNTER — Telehealth (HOSPITAL_COMMUNITY): Payer: Self-pay | Admitting: *Deleted

## 2023-03-26 MED ORDER — OXYCODONE-ACETAMINOPHEN 10-325 MG PO TABS
ORAL_TABLET | ORAL | 0 refills | Status: DC
  Filled 2023-03-26 (×2): qty 180, 30d supply, fill #0

## 2023-03-26 MED ORDER — PHENTERMINE HCL 37.5 MG PO CAPS
37.5000 mg | ORAL_CAPSULE | Freq: Every day | ORAL | 0 refills | Status: DC
  Filled 2023-03-26 (×2): qty 30, 30d supply, fill #0

## 2023-03-26 NOTE — Telephone Encounter (Signed)
Reaching out to patient to offer assistance regarding upcoming cardiac imaging study; pt verbalizes understanding of appt date/time, parking situation and where to check in, pre-test NPO status and medications ordered, and verified current allergies; name and call back number provided for further questions should they arise  Larey Brick RN Navigator Cardiac Imaging [facility] [phone] office [phone] cell  Reviewed 13 hours contrast prep instructions and sent patient a mychart message with times. Patient verbalized understanding.

## 2023-03-27 ENCOUNTER — Ambulatory Visit (HOSPITAL_COMMUNITY)
Admission: RE | Admit: 2023-03-27 | Discharge: 2023-03-27 | Disposition: A | Discharge status: Home / Self Care | Payer: 59 | Source: Ambulatory Visit | Attending: Cardiology | Admitting: Cardiology

## 2023-03-27 DIAGNOSIS — R072 Precordial pain: Secondary | ICD-10-CM | POA: Diagnosis present

## 2023-03-27 MED ORDER — DIPHENHYDRAMINE HCL 50 MG/ML IJ SOLN
25.0000 mg | Freq: Once | INTRAMUSCULAR | Status: AC
Start: 2023-03-27 — End: 2023-03-27
  Administered 2023-03-27: 25 mg via INTRAVENOUS

## 2023-03-27 MED ORDER — IOHEXOL 350 MG/ML SOLN
100.0000 mL | Freq: Once | INTRAVENOUS | Status: AC | PRN
  Administered 2023-03-27: 100 mL via INTRAVENOUS

## 2023-03-27 MED ORDER — NITROGLYCERIN 0.4 MG SL SUBL
0.8000 mg | SUBLINGUAL_TABLET | Freq: Once | SUBLINGUAL | Status: AC
  Administered 2023-03-27: 0.8 mg via SUBLINGUAL

## 2023-03-27 MED ORDER — DILTIAZEM HCL 25 MG/5ML IV SOLN: 10.0000 mg | INTRAVENOUS | Status: DC | PRN

## 2023-03-27 MED ORDER — DILTIAZEM HCL 25 MG/5ML IV SOLN
INTRAVENOUS | Status: AC
  Filled 2023-03-27: qty 5

## 2023-03-27 MED ORDER — DIPHENHYDRAMINE HCL 50 MG/ML IJ SOLN
INTRAMUSCULAR | Status: AC
  Filled 2023-03-27: qty 1

## 2023-03-27 MED ORDER — NITROGLYCERIN 0.4 MG SL SUBL
SUBLINGUAL_TABLET | SUBLINGUAL | Status: AC
  Filled 2023-03-27: qty 2

## 2023-03-27 NOTE — Progress Notes (Signed)
5:15 Post CTA patient c/o itchiness. Pt has history of contrast allergy, confirms that she did follow 13 hour pre-med prep. No c/o SOB or dizziness.  MD Tobb notified, verbal order for 25 mg benadryl IV.  BP 113/77.  Brought back to nurses station for further monitoring. Toniann Fail, PA to bedside to assess patient.  15:30 no c/o itching or SOB. 15:50 Toniann Fail, PA to bedside again to assess patient, per Toniann Fail, pt safe to discharge.

## 2023-03-30 ENCOUNTER — Other Ambulatory Visit: Payer: Self-pay

## 2023-04-01 ENCOUNTER — Other Ambulatory Visit (INDEPENDENT_AMBULATORY_CARE_PROVIDER_SITE_OTHER): Payer: Self-pay | Admitting: Primary Care

## 2023-04-02 ENCOUNTER — Other Ambulatory Visit: Payer: Self-pay

## 2023-04-02 NOTE — Telephone Encounter (Signed)
Will forward to provider  

## 2023-04-03 ENCOUNTER — Other Ambulatory Visit: Payer: Self-pay

## 2023-04-03 MED ORDER — PREGABALIN 50 MG PO CAPS
50.0000 mg | ORAL_CAPSULE | Freq: Two times a day (BID) | ORAL | 1 refills | Status: DC
  Filled 2023-04-03: qty 180, 90d supply, fill #0
  Filled 2023-07-15: qty 180, 90d supply, fill #1

## 2023-04-03 MED ORDER — QUETIAPINE FUMARATE 25 MG PO TABS
25.0000 mg | ORAL_TABLET | Freq: Every day | ORAL | 1 refills | Status: DC
  Filled 2023-04-03: qty 30, 30d supply, fill #0
  Filled 2023-05-07: qty 30, 30d supply, fill #1

## 2023-04-03 MED ORDER — CLOTRIMAZOLE-BETAMETHASONE 1-0.05 % EX CREA
1.0000 | TOPICAL_CREAM | Freq: Every day | CUTANEOUS | 0 refills | Status: DC
  Filled 2023-04-03: qty 30, 30d supply, fill #0

## 2023-04-09 ENCOUNTER — Other Ambulatory Visit: Payer: Self-pay

## 2023-04-10 ENCOUNTER — Other Ambulatory Visit: Payer: Self-pay

## 2023-04-14 ENCOUNTER — Other Ambulatory Visit: Payer: Self-pay

## 2023-04-15 ENCOUNTER — Telehealth: Payer: 59 | Admitting: Family Medicine

## 2023-04-15 ENCOUNTER — Other Ambulatory Visit: Payer: Self-pay

## 2023-04-15 DIAGNOSIS — B379 Candidiasis, unspecified: Secondary | ICD-10-CM | POA: Diagnosis not present

## 2023-04-15 DIAGNOSIS — R3989 Other symptoms and signs involving the genitourinary system: Secondary | ICD-10-CM

## 2023-04-15 DIAGNOSIS — T3695XA Adverse effect of unspecified systemic antibiotic, initial encounter: Secondary | ICD-10-CM | POA: Diagnosis not present

## 2023-04-15 MED ORDER — FLUCONAZOLE 150 MG PO TABS
150.0000 mg | ORAL_TABLET | ORAL | 0 refills | Status: DC
  Filled 2023-04-15: qty 2, 4d supply, fill #0

## 2023-04-15 MED ORDER — CEPHALEXIN 500 MG PO CAPS
500.0000 mg | ORAL_CAPSULE | Freq: Two times a day (BID) | ORAL | 0 refills | Status: AC
  Filled 2023-04-15: qty 14, 7d supply, fill #0

## 2023-04-15 NOTE — Progress Notes (Signed)

## 2023-04-21 NOTE — Progress Notes (Signed)
 Patient: Angelica Ortiz Date of Birth: 19-Feb-1966  Reason for Visit: Follow up History from: Patient Primary Neurologist: Tresia Fruit   Virtual Visit via Video Note  I connected with Angelica Ortiz on 04/22/23 at  3:00 PM EST by a video enabled telemedicine application and verified that I am speaking with the correct person using two identifiers.  Location: Patient: at her home Provider: in the office    I discussed the limitations of evaluation and management by telemedicine and the availability of in person appointments. The patient expressed understanding and agreed to proceed.  ASSESSMENT AND PLAN 58 y.o. year old female   1.  Chronic migraine without aura  -Migraines currently under excellent control with Ajovy ! -Continue Ajovy  225 mg monthly injection for migraine prevention -Continue Nurtec as needed for acute headache treatment -Previously tried and failed: Qulipta , Amitriptyline, Lyrica , magnesium, Nurtec, Aimovig, rizatriptan, Imitrex .  Propranolol contraindicated due to asthma. She had severe constipation with the aimovig. Topamax.  -Follow-up with me virtually in 1 year or sooner if needed  Meds ordered this encounter  Medications   Fremanezumab -vfrm (AJOVY ) 225 MG/1.5ML SOAJ    Sig: Inject 225 mg into the skin every 30 (thirty) days.    Dispense:  1.5 mL    Refill:  11   NURTEC 75 MG TBDP    Sig: Take 1 tablet (75 mg total) by mouth daily as needed (migraine).    Dispense:  48 tablet    Refill:  4    This is a 23-month supply.   HISTORY OF PRESENT ILLNESS: Today 04/22/23 Via VV. Doing well. Has been back on Ajovy  for a few months. Was given a sample in office, but insurance has now approved. Migraines are rare!! < 5 migraines in the last 3 months!! Takes Nurtec with excellent benefit. Migraines are doing phenomenal with Ajovy !!  Update 10/08/22 SS: Here for follow-up.  Was previously doing excellent on Ajovy .  In February 2024 her insurance required her to try Qulipta . The  Qulipta  is not helping, is miserable. Previously Ajovy  was doing excellent would only have 1-2 migraines a month.  Feels like at least 4-5 migraines a week, headache is daily. Takes  Nurtec with benefit, but maxing out. Relies on Excedrin Migraine, causes rebound. Goes to pain management for DDD, chronic pain issues, arthritis, herniated disc in her neck. Is on short term disability for complex regional pain syndrome to right hand. Cannot type.   HISTORY  10/03/2021: Ajovy  is  life changing.  Before ajovy , baseline was at least 20 headache days out of the month, at least 15 of those are moderate to severe migraines. Now she has reduces to 4 migraines a month and no headaches. It is working extremely well. The few times she did have one, she took one nurtec and it terminated the headache. It has made a huge difference. She has herniated disks in her neck and she has shooting pain and that triggers her migraine but otherwise she is doing "absolutely a god send"   Patient complains of symptoms per HPI as well as the following symptoms: migraines . Pertinent negatives and positives per HPI. All others negative     HPI:  Angelica Ortiz is a 58 y.o. female here as requested by Susanna Epley, FNP for chronic migraines.  Past medical history diabetes, depression, fibromyalgia, asthma, lupus, asthma, morbid obesity, hypertension, migraines, B12 deficiency.  I have reviewed Dr. Frieda Jew notes, patient has tried several different medications for her headaches and migraines,  not responding to any including the Nurtec, I reviewed notes in epic I could not find any more significant information on her migraines or headaches, I also looked for imaging in epic and "care everywhere" no imaging of the brain, does not appear as though she seen anybody for migraines or headaches and "Care Everywhere".No aura. No medication overuse.    She has been suffering with migraines since a teenager, started worsening as a young adult,  diagnosed 2008-2009. The last 6 months she can have severe migraines, they are on the left side, pulsating/pounding/throbbing/photophobia/phonophobia/nausea, eye pain like pressure behind the eye, shw wakes up with headaches and can be worse supine,no significant snoring, refreshed no excessive daytime somnolence, it can keep her in bed all day can last up to 24 hours, slamming of doors makes it worse, a dark room helps and a sleep mask, she has room darkening shades, at least 20 headache days out of the month, at least 15 of those are moderate to severe migraines. Nurtec works significantly. No hearing changes but she gets blurry vision, worsening and severe headaches. She has a lot of nausea with the headaches. No medication overuse. Smells can trigger a migraine. No other focal neurologic deficits, associated symptoms, inciting events or modifiable factors.     Reviewed notes, labs and imaging from outside physicians, which showed: 03/2021 cbc/cmp nml 10/2020: tsh nml   From a thorough review of records, medications tried that can be used in migraine management include: Amitriptyline, Lyrica , magnesium, Nurtec, Aimovig, rizatriptan, Imitrex .  Propranolol contraindicated due to asthma. She had severe constipation with the aimovig. Topamax.   REVIEW OF SYSTEMS: Out of a complete 14 system review of symptoms, the patient complains only of the following symptoms, and all other reviewed systems are negative.  See HPI  ALLERGIES: Allergies  Allergen Reactions   Dilaudid  [Hydromorphone  Hcl] Hives and Itching   Iodinated Contrast Media Hives and Itching    Pt given 4 hour prep 06/21/20, c/o itching after injection- [facility]. 03/27/23: reacted to 13 HOUR PREP, c/o itching after receiving contrast.    Levaquin [Levofloxacin] Anaphylaxis   Tangerine Flavoring Agent (Non-Screening) Swelling    Tangerine fruit: Lip and tongue swelling with tangerines   Ace Inhibitors Swelling   Iodine-131 Itching and Rash     HOME MEDICATIONS: Outpatient Medications Prior to Visit  Medication Sig Dispense Refill   albuterol  (VENTOLIN  HFA) 108 (90 Base) MCG/ACT inhaler Inhale 2 puffs into the lungs every 6 (six) hours as needed for shortness of breath or wheezing. 18 g 2   BIOTIN PO Take 1 tablet by mouth daily.     brimonidine (ALPHAGAN) 0.2 % ophthalmic solution 3 (three) times daily.     budesonide -formoterol  (SYMBICORT ) 160-4.5 MCG/ACT inhaler Inhale 2 puffs into the lungs daily. 10.2 g 3   CALCIUM  PO Take 1 tablet by mouth daily.     cephALEXin  (KEFLEX ) 500 MG capsule Take 1 capsule (500 mg total) by mouth 2 (two) times daily for 7 days. 14 capsule 0   clotrimazole -betamethasone  (LOTRISONE ) cream Apply 1 Application topically daily. 30 g 0   cyclobenzaprine  (FLEXERIL ) 10 MG tablet Take 1 tablet by mouth once daily as needed for severe muscle spasms 30 tablet 2   diclofenac  Sodium (VOLTAREN ) 1 % GEL Apply 2 g topically daily as needed. 350 g 1   DULoxetine (CYMBALTA) 30 MG capsule Take 30 mg by mouth daily. (Patient not taking: Reported on 03/09/2023)     ELDERBERRY PO Take 1 tablet by mouth  daily.     fluconazole  (DIFLUCAN ) 150 MG tablet Take 1 tablet (150 mg total) by mouth as directed. Repeat in 3 days as needed 2 tablet 0   fluticasone  (FLONASE ) 50 MCG/ACT nasal spray Place 1 spray into both nostrils in the morning. 18 mL 3   Fremanezumab -vfrm (AJOVY ) 225 MG/1.5ML SOAJ Inject 225 mg into the skin every 30 (thirty) days. 1.5 mL 11   lidocaine  (LIDODERM ) 5 % Place 3 patches onto the skin daily. 30 patch 3   linaclotide  (LINZESS ) 145 MCG CAPS capsule Take 1 capsule (145 mcg total) by mouth daily. 30 capsule 2   MAGNESIUM PO Take 1 tablet by mouth daily.     metoprolol  tartrate (LOPRESSOR ) 100 MG tablet Take 2 hours prior to CT 1 tablet 0   Multiple Vitamin (MULTIVITAMIN WITH MINERALS) TABS tablet Take 1 tablet by mouth 2 (two) times daily.     naloxone  (NARCAN ) nasal spray 4 mg/0.1 mL 1 spray every 3  minutes; spray 1 dose into ONE nostril; alternate nostrils w each dose until help arrives 2 each 1   NURTEC 75 MG TBDP Take 1 tablet (75 mg total) by mouth daily as needed (migraine). 48 tablet 4   oxyCODONE -acetaminophen  (PERCOCET) 10-325 MG tablet Take 1 tablet by mouth every 4 (four) to 6 (six)  hours as needed for pain. 180 tablet 0   oxyCODONE -acetaminophen  (PERCOCET) 10-325 MG tablet 1 tablet by mouth every 4 to 6 hours as needed for pain 180 tablet 0   phentermine  37.5 MG capsule Take 1 capsule (37.5 mg total) by mouth daily. 30 capsule 0   predniSONE  (DELTASONE ) 50 MG tablet Take 50 mg (one tablet) 13 hours before procedure. Take 50 mg (once tablet) 7 house before procedure. Take 50 mg (one tablet) before leaving home before procedure. 3 tablet 0   pregabalin  (LYRICA ) 50 MG capsule Take 1 capsule (50 mg total) by mouth 2 (two) times daily. 180 capsule 1   QUEtiapine  (SEROQUEL ) 25 MG tablet Take 1 tablet (25 mg total) by mouth at bedtime. 30 tablet 1   rosuvastatin  (CRESTOR ) 5 MG tablet Take 1 tablet (5 mg total) by mouth daily. 90 tablet 2   tirzepatide  (MOUNJARO ) 15 MG/0.5ML Pen Inject 15 mg into the skin once a week. 6 mL 4   vitamin B-12 (CYANOCOBALAMIN) 100 MCG tablet Take 100 mcg by mouth daily.     No facility-administered medications prior to visit.    PAST MEDICAL HISTORY: Past Medical History:  Diagnosis Date   Anemia    Arthritis    Asthma    Depression    Diabetes mellitus without complication (HCC)    Type 2   Diverticulitis    Falls    Fibromyalgia    Gait instability    GERD (gastroesophageal reflux disease)    History of urinary tract infection    Hypertension    Lupus    no meds   Migraine    Morbid obesity 03/30/2013   Overactive bladder    Overactive bladder    Pulmonary embolism (HCC)    Resolved -At age 25 broken right leg, blood clot broke off - into lung   Seasonal allergies    Sleep apnea 06/05/2013   Does not have CPAP machine   Urinary  incontinence     PAST SURGICAL HISTORY: Past Surgical History:  Procedure Laterality Date   ABDOMINAL HYSTERECTOMY     APPENDECTOMY     CESAREAN SECTION  04/08/1999   x 1  CHOLECYSTECTOMY     DILATION AND CURETTAGE OF UTERUS     x 3 for AUB   GASTRIC ROUX-EN-Y N/A 03/13/2015   Procedure: LAPAROSCOPIC ROUX-EN-Y GASTRIC BYPASS  ENTEROLYSIS OF ADHESIONS WITH UPPER ENDOSCOPY;  Surgeon: Juanita Norlander, MD;  Location: [facility];  Service: General;  Laterality: N/A;   HYSTEROSCOPY WITH NOVASURE N/A 03/14/2014   Procedure: HYSTEROSCOPY WITH NOVASURE;  Surgeon: Lenord Radon, MD;  Location: [facility];  Service: Gynecology;  Laterality: N/A;   LAMINECTOMY     L4-L5   LAPAROSCOPIC LYSIS OF ADHESIONS  03/13/2015   Procedure: LAPAROSCOPIC LYSIS OF ADHESIONS;  Surgeon: Juanita Norlander, MD;  Location: [facility];  Service: General;;   LAPAROSCOPIC OOPHERECTOMY Right 09/05/2014   Procedure: abdominal OOPHERECTOMY;  Surgeon: Lenord Radon, MD;  Location: [facility];  Service: Gynecology;  Laterality: Right;   LAPAROSCOPIC OVARIAN CYSTECTOMY Right 04/07/1992   ORIF ANKLE FRACTURE Left    OVARIAN CYST REMOVAL Left 09/05/2014   Procedure: OVARIAN CYSTECTOMY and portion of left ovary removed ;  Surgeon: Lenord Radon, MD;  Location: [facility];  Service: Gynecology;  Laterality: Left;   ROTATOR CUFF REPAIR     right   ROTATOR CUFF REPAIR Right    SALPINGOOPHORECTOMY Bilateral 09/05/2014   Procedure: BILATERAL SALPINGECTOMY;  Surgeon: Lenord Radon, MD;  Location: [facility];  Service: Gynecology;  Laterality: Bilateral;   SHOULDER ARTHROSCOPY WITH ROTATOR CUFF REPAIR AND SUBACROMIAL DECOMPRESSION Left 11/01/2020   Procedure: SHOULDER ARTHROSCOPY WITH ROTATOR CUFF REPAIR AND SUBACROMIAL DECOMPRESSION, DISTAL CLAVICLE EXCISION;  Surgeon: Sammye Cristal, MD;  Location: [facility];  Service: Orthopedics;  Laterality: Left;   SUPRACERVICAL ABDOMINAL HYSTERECTOMY N/A 09/05/2014   Procedure:  HYSTERECTOMY SUPRACERVICAL ABDOMINAL;  Surgeon: Lenord Radon, MD;  Location: [facility];  Service: Gynecology;  Laterality: N/A;   TONSILLECTOMY     UPPER GI ENDOSCOPY  03/13/2015   Procedure: UPPER GI ENDOSCOPY;  Surgeon: Juanita Norlander, MD;  Location: [facility];  Service: General;;    FAMILY HISTORY: Family History  Problem Relation Age of Onset   Diabetes Mother    Hypertension Mother    Heart disease Mother    Cancer Father    Heart disease Father    Hypertension Sister    Cancer Sister    Diabetes Brother    Hypertension Brother    Cancer Maternal Uncle    Cancer Paternal Uncle    Breast cancer Neg Hx     SOCIAL HISTORY: Social History   Socioeconomic History   Marital status: Divorced    Spouse name: Not on file   Number of children: Not on file   Years of education: Not on file   Highest education level: Not on file  Occupational History   Not on file  Tobacco Use   Smoking status: Never   Smokeless tobacco: Never  Vaping Use   Vaping status: Never Used  Substance and Sexual Activity   Alcohol use: No   Drug use: No   Sexual activity: Not Currently    Birth control/protection: None  Other Topics Concern   Not on file  Social History Narrative   Not on file   Social Drivers of Health   Financial Resource Strain: Low Risk  (11/29/2021)   Overall Financial Resource Strain (CARDIA)    Difficulty of Paying Living Expenses: Not hard at all  Food Insecurity: No Food Insecurity (11/29/2021)   Hunger Vital Sign    Worried About Running Out of Food in the Last Year: Never true  Ran Out of Food in the Last Year: Never true  Transportation Needs: Unmet Transportation Needs (11/29/2021)   PRAPARE - Transportation    Lack of Transportation (Medical): Yes    Lack of Transportation (Non-Medical): Yes  Physical Activity: Inactive (11/29/2021)   Exercise Vital Sign    Days of Exercise per Week: 0 days    Minutes of Exercise per Session: 0 min  Stress: No Stress  Concern Present (11/29/2021)   [employer] of Occupational Health - Occupational Stress Questionnaire    Feeling of Stress : Not at all  Social Connections: Moderately Integrated (11/29/2021)   Social Connection and Isolation Panel [NHANES]    Frequency of Communication with Friends and Family: More than three times a week    Frequency of Social Gatherings with Friends and Family: More than three times a week    Attends Religious Services: More than 4 times per year    Active Member of [employer] or Organizations: Yes    Attends Engineer, structural: More than 4 times per year    Marital Status: Divorced  Intimate Partner Violence: Not At Risk (11/29/2021)   Humiliation, Afraid, Rape, and Kick questionnaire    Fear of Current or Ex-Partner: No    Emotionally Abused: No    Physically Abused: No    Sexually Abused: No    PHYSICAL EXAM  There were no vitals filed for this visit.  There is no height or weight on file to calculate BMI.  Via video visit, is alert and oriented, speech is clear and concise, facial symmetry noted, moves about freely  DIAGNOSTIC DATA (LABS, IMAGING, TESTING) - I reviewed patient records, labs, notes, testing and imaging myself where available.  Lab Results  Component Value Date   WBC 6.8 01/15/2023   HGB 13.3 01/15/2023   HCT 41.3 01/15/2023   MCV 88 01/15/2023   PLT 226 01/15/2023      Component Value Date/Time   NA 143 03/23/2023 0906   K 3.9 03/23/2023 0906   CL 107 (H) 03/23/2023 0906   CO2 24 03/23/2023 0906   GLUCOSE 81 03/23/2023 0906   GLUCOSE 131 (H) 06/20/2020 2153   BUN 15 03/23/2023 0906   CREATININE 0.67 03/23/2023 0906   CALCIUM  9.2 03/23/2023 0906   PROT 6.1 03/23/2023 0906   ALBUMIN 3.9 03/23/2023 0906   AST 26 03/23/2023 0906   ALT 23 03/23/2023 0906   ALKPHOS 82 03/23/2023 0906   BILITOT 0.6 03/23/2023 0906   GFRNONAA >60 06/20/2020 2153   GFRAA >60 03/09/2015 1155   Lab Results  Component Value Date   CHOL  159 01/15/2023   HDL 60 01/15/2023   LDLCALC 82 01/15/2023   TRIG 94 01/15/2023   CHOLHDL 2.7 01/15/2023   Lab Results  Component Value Date   HGBA1C 5.3 01/15/2023   Lab Results  Component Value Date   VITAMINB12 721 01/15/2023   Lab Results  Component Value Date   TSH 1.820 05/01/2022    Jeanmarie Millet, AGNP-C, DNP 04/22/2023, 2:46 PM [facility] [address], [address] [place], [place] [address] [phone]

## 2023-04-22 ENCOUNTER — Telehealth: Payer: 59 | Admitting: Neurology

## 2023-04-22 ENCOUNTER — Other Ambulatory Visit: Payer: Self-pay

## 2023-04-22 DIAGNOSIS — G43809 Other migraine, not intractable, without status migrainosus: Secondary | ICD-10-CM

## 2023-04-22 DIAGNOSIS — G43709 Chronic migraine without aura, not intractable, without status migrainosus: Secondary | ICD-10-CM

## 2023-04-22 MED ORDER — AJOVY 225 MG/1.5ML ~~LOC~~ SOAJ
225.0000 mg | SUBCUTANEOUS | 11 refills | Status: DC
  Filled 2023-04-22: qty 1.5, 28d supply, fill #0
  Filled 2023-05-26: qty 1.5, 28d supply, fill #1
  Filled 2023-06-22: qty 1.5, 28d supply, fill #2
  Filled 2023-07-22: qty 1.5, 28d supply, fill #3
  Filled 2023-08-18: qty 1.5, 28d supply, fill #4
  Filled 2023-09-13: qty 1.5, 28d supply, fill #5
  Filled 2023-10-16: qty 1.5, 28d supply, fill #6
  Filled 2023-11-16: qty 1.5, 28d supply, fill #7
  Filled 2023-12-11: qty 1.5, 28d supply, fill #8
  Filled 2024-01-08: qty 1.5, 28d supply, fill #9
  Filled 2024-02-11: qty 1.5, 28d supply, fill #10
  Filled 2024-03-10: qty 1.5, 28d supply, fill #11

## 2023-04-22 MED ORDER — NURTEC 75 MG PO TBDP
75.0000 mg | ORAL_TABLET | Freq: Every day | ORAL | 4 refills | Status: DC | PRN
  Filled 2023-04-22: qty 16, 30d supply, fill #0
  Filled 2023-05-26: qty 16, 30d supply, fill #1
  Filled 2023-06-22: qty 16, 30d supply, fill #2
  Filled 2023-07-22: qty 16, 30d supply, fill #3
  Filled 2023-09-06: qty 16, 30d supply, fill #4
  Filled 2023-10-19: qty 16, 30d supply, fill #5
  Filled 2024-01-08: qty 16, 30d supply, fill #6
  Filled 2024-02-11: qty 16, 30d supply, fill #7
  Filled 2024-03-10: qty 16, 30d supply, fill #8
  Filled 2024-04-09: qty 16, 30d supply, fill #9

## 2023-04-22 NOTE — Patient Instructions (Signed)
 Great to see you today!!  I am thrilled that you are doing so well!! We will continue current medications. Please reach out if you need anything over the next year. Follow-up in 1 year virtually or sooner if needed.  Thanks!!

## 2023-04-24 ENCOUNTER — Other Ambulatory Visit: Payer: Self-pay

## 2023-04-24 ENCOUNTER — Other Ambulatory Visit (INDEPENDENT_AMBULATORY_CARE_PROVIDER_SITE_OTHER): Payer: Self-pay | Admitting: Primary Care

## 2023-04-24 DIAGNOSIS — Z79899 Other long term (current) drug therapy: Secondary | ICD-10-CM | POA: Diagnosis not present

## 2023-04-24 DIAGNOSIS — M5137 Other intervertebral disc degeneration, lumbosacral region with discogenic back pain only: Secondary | ICD-10-CM | POA: Diagnosis not present

## 2023-04-24 DIAGNOSIS — R030 Elevated blood-pressure reading, without diagnosis of hypertension: Secondary | ICD-10-CM | POA: Diagnosis not present

## 2023-04-24 DIAGNOSIS — M5030 Other cervical disc degeneration, unspecified cervical region: Secondary | ICD-10-CM | POA: Diagnosis not present

## 2023-04-24 MED ORDER — OXYCODONE-ACETAMINOPHEN 10-325 MG PO TABS
1.0000 | ORAL_TABLET | ORAL | 0 refills | Status: DC
  Filled 2023-04-24: qty 180, 30d supply, fill #0

## 2023-04-24 MED ORDER — LIDOCAINE 5 % EX PTCH
3.0000 | MEDICATED_PATCH | Freq: Every day | CUTANEOUS | 3 refills | Status: DC
  Filled 2023-04-24: qty 30, 10d supply, fill #0
  Filled 2023-05-07: qty 30, 10d supply, fill #1
  Filled 2023-05-20: qty 30, 10d supply, fill #2
  Filled 2023-06-08: qty 30, 10d supply, fill #3

## 2023-04-24 MED ORDER — PHENTERMINE HCL 37.5 MG PO CAPS
37.5000 mg | ORAL_CAPSULE | Freq: Every day | ORAL | 0 refills | Status: AC
  Filled 2023-04-24: qty 30, 30d supply, fill #0

## 2023-04-24 MED ORDER — NALOXONE HCL 4 MG/0.1ML NA LIQD
NASAL | 1 refills | Status: AC
  Filled 2023-04-24: qty 2, 1d supply, fill #0

## 2023-04-24 NOTE — Telephone Encounter (Signed)
Requested Prescriptions  Pending Prescriptions Disp Refills   lidocaine (LIDODERM) 5 % 30 patch 3    Sig: Place 3 patches onto the skin daily.     Analgesics:  Topicals Failed - 04/24/2023  2:48 PM      Failed - Manual Review: Labs are only required if the patient has taken medication for more than 8 weeks.      Passed - PLT in normal range and within 360 days    Platelets  Date Value Ref Range Status  01/15/2023 226 150 - 450 x10E3/uL Final         Passed - HGB in normal range and within 360 days    Hemoglobin  Date Value Ref Range Status  01/15/2023 13.3 11.1 - 15.9 g/dL Final         Passed - HCT in normal range and within 360 days    Hematocrit  Date Value Ref Range Status  01/15/2023 41.3 34.0 - 46.6 % Final         Passed - Cr in normal range and within 360 days    Creatinine, Ser  Date Value Ref Range Status  03/23/2023 0.67 0.57 - 1.00 mg/dL Final         Passed - eGFR is 30 or above and within 360 days    GFR calc Af Amer  Date Value Ref Range Status  03/09/2015 >60 >60 mL/min Final    Comment:    (NOTE) The eGFR has been calculated using the CKD EPI equation. This calculation has not been validated in all clinical situations. eGFR's persistently <60 mL/min signify possible Chronic Kidney Disease.    GFR, Estimated  Date Value Ref Range Status  06/20/2020 >60 >60 mL/min Final    Comment:    (NOTE) Calculated using the CKD-EPI Creatinine Equation (2021)    eGFR  Date Value Ref Range Status  03/23/2023 103 >59 mL/min/1.73 Final         Passed - Patient is not pregnant      Passed - Valid encounter within last 12 months    Recent Outpatient Visits           2 months ago Controlled type 2 diabetes mellitus with complication, without long-term current use of insulin (HCC)   [facility] Drucilla Chalet, RPH-CPP   3 months ago Prediabetes   [place] [facility]  Grayce Sessions, NP   5 months ago Vaginal discharge   [place] [facility] Grayce Sessions, NP   8 months ago Morbid obesity   [place] [facility] Grayce Sessions, NP   11 months ago Need for immunization against influenza   [place] [facility] Grayce Sessions, NP       Future Appointments             In 1 month Tobb, Lavona Mound, DO [facility]   In 1 year Christia Reading, Lindalou Hose, NP [facility]

## 2023-04-26 ENCOUNTER — Other Ambulatory Visit: Payer: Self-pay

## 2023-04-28 DIAGNOSIS — Z79899 Other long term (current) drug therapy: Secondary | ICD-10-CM | POA: Diagnosis not present

## 2023-05-05 ENCOUNTER — Other Ambulatory Visit: Payer: Self-pay

## 2023-05-07 ENCOUNTER — Other Ambulatory Visit: Payer: Self-pay

## 2023-05-08 ENCOUNTER — Other Ambulatory Visit: Payer: Self-pay

## 2023-05-08 MED ORDER — LINZESS 145 MCG PO CAPS
145.0000 ug | ORAL_CAPSULE | Freq: Every day | ORAL | 2 refills | Status: DC
  Filled 2023-05-08: qty 30, 30d supply, fill #0
  Filled 2023-06-08: qty 30, 30d supply, fill #1

## 2023-05-11 ENCOUNTER — Other Ambulatory Visit: Payer: Self-pay

## 2023-05-11 ENCOUNTER — Telehealth: Payer: 59 | Admitting: Physician Assistant

## 2023-05-11 DIAGNOSIS — R3989 Other symptoms and signs involving the genitourinary system: Secondary | ICD-10-CM

## 2023-05-11 DIAGNOSIS — B379 Candidiasis, unspecified: Secondary | ICD-10-CM | POA: Diagnosis not present

## 2023-05-11 DIAGNOSIS — T3695XA Adverse effect of unspecified systemic antibiotic, initial encounter: Secondary | ICD-10-CM | POA: Diagnosis not present

## 2023-05-11 MED ORDER — SULFAMETHOXAZOLE-TRIMETHOPRIM 800-160 MG PO TABS
1.0000 | ORAL_TABLET | Freq: Two times a day (BID) | ORAL | 0 refills | Status: DC
  Filled 2023-05-11: qty 10, 5d supply, fill #0

## 2023-05-11 MED ORDER — FLUCONAZOLE 150 MG PO TABS
150.0000 mg | ORAL_TABLET | ORAL | 0 refills | Status: DC | PRN
  Filled 2023-05-11: qty 2, 6d supply, fill #0

## 2023-05-11 NOTE — Progress Notes (Signed)
 E-Visit for Urinary Problems  We are sorry that you are not feeling well.  Here is how we plan to help!  Based on what you shared with me it looks like you most likely have a simple urinary tract infection.  A UTI (Urinary Tract Infection) is a bacterial infection of the bladder.  Most cases of urinary tract infections are simple to treat but a key part of your care is to encourage you to drink plenty of fluids and watch your symptoms carefully.  I have prescribed Bactrim DS One tablet twice a day for 5 days.  Your symptoms should gradually improve. Call us if the burning in your urine worsens, you develop worsening fever, back pain or pelvic pain or if your symptoms do not resolve after completing the antibiotic.  Diflucan given as prophylaxis as patient tends to get vaginal yeast infections with antibiotic use.  Urinary tract infections can be prevented by drinking plenty of water to keep your body hydrated.  Also be sure when you wipe, wipe from front to back and don't hold it in!  If possible, empty your bladder every 4 hours.  HOME CARE Drink plenty of fluids Compete the full course of the antibiotics even if the symptoms resolve Remember, when you need to go.go. Holding in your urine can increase the likelihood of getting a UTI! GET HELP RIGHT AWAY IF: You cannot urinate You get a high fever Worsening back pain occurs You see blood in your urine You feel sick to your stomach or throw up You feel like you are going to pass out  MAKE SURE YOU  Understand these instructions. Will watch your condition. Will get help right away if you are not doing well or get worse.   Thank you for choosing an e-visit.  Your e-visit answers were reviewed by a board certified advanced clinical practitioner to complete your personal care plan. Depending upon the condition, your plan could have included both over the counter or prescription medications.  Please review your pharmacy choice. Make  sure the pharmacy is open so you can pick up prescription now. If there is a problem, you may contact your provider through [employer] and have the prescription routed to another pharmacy.  Your safety is important to [place]. If you have drug allergies check your prescription carefully.   For the next 24 hours you can use MyChart to ask questions about today's visit, request a non-urgent call back, or ask for a work or school excuse. You will get an email in the next two days asking about your experience. I hope that your e-visit has been valuable and will speed your recovery.   I have spent 5 minutes in review of e-visit questionnaire, review and updating patient chart, medical decision making and response to patient.   Margaretann Loveless, PA-C

## 2023-05-12 ENCOUNTER — Other Ambulatory Visit: Payer: Self-pay

## 2023-05-20 ENCOUNTER — Other Ambulatory Visit (INDEPENDENT_AMBULATORY_CARE_PROVIDER_SITE_OTHER): Payer: Self-pay | Admitting: Primary Care

## 2023-05-21 ENCOUNTER — Other Ambulatory Visit: Payer: Self-pay

## 2023-05-21 NOTE — Telephone Encounter (Signed)
Requested medication (s) are due for refill today: Yes  Requested medication (s) are on the active medication list: Yes  Last refill:  04/03/23  Future visit scheduled: Yes  Notes to clinic:  Unable to refill due to no refill protocol for this medication.      Requested Prescriptions  Pending Prescriptions Disp Refills   clotrimazole-betamethasone (LOTRISONE) cream 30 g 0    Sig: Apply 1 Application topically daily.     Off-Protocol Failed - 05/21/2023 12:30 PM      Failed - Medication not assigned to a protocol, review manually.      Passed - Valid encounter within last 12 months    Recent Outpatient Visits           3 months ago Controlled type 2 diabetes mellitus with complication, without long-term current use of insulin (HCC)   [place] [place] Drucilla Chalet, RPH-CPP   4 months ago Prediabetes   [place] [facility] Grayce Sessions, NP   6 months ago Vaginal discharge   [place] [facility] Grayce Sessions, NP   9 months ago Morbid obesity   [place] [facility] Grayce Sessions, NP   1 year ago Need for immunization against influenza   [place] [facility] Grayce Sessions, NP       Future Appointments             In 1 week Tobb, Lavona Mound, DO [facility]   In 11 months Christia Reading, Lindalou Hose, NP [facility]               r

## 2023-05-26 ENCOUNTER — Other Ambulatory Visit: Payer: Self-pay

## 2023-05-27 ENCOUNTER — Ambulatory Visit (INDEPENDENT_AMBULATORY_CARE_PROVIDER_SITE_OTHER): Payer: 59 | Admitting: Primary Care

## 2023-05-27 ENCOUNTER — Encounter (INDEPENDENT_AMBULATORY_CARE_PROVIDER_SITE_OTHER): Payer: Self-pay | Admitting: Primary Care

## 2023-05-27 ENCOUNTER — Other Ambulatory Visit: Payer: Self-pay

## 2023-05-27 VITALS — BP 125/83 | HR 40 | Resp 16 | Ht 65.0 in | Wt 190.0 lb

## 2023-05-27 DIAGNOSIS — Z6841 Body Mass Index (BMI) 40.0 and over, adult: Secondary | ICD-10-CM | POA: Diagnosis not present

## 2023-05-27 DIAGNOSIS — R7303 Prediabetes: Secondary | ICD-10-CM | POA: Diagnosis not present

## 2023-05-27 DIAGNOSIS — F5102 Adjustment insomnia: Secondary | ICD-10-CM

## 2023-05-27 MED ORDER — QUETIAPINE FUMARATE 50 MG PO TABS
50.0000 mg | ORAL_TABLET | Freq: Every day | ORAL | 1 refills | Status: DC
  Filled 2023-05-27: qty 90, 90d supply, fill #0
  Filled 2023-08-21: qty 90, 90d supply, fill #1

## 2023-05-27 MED ORDER — CLOTRIMAZOLE-BETAMETHASONE 1-0.05 % EX CREA
1.0000 | TOPICAL_CREAM | Freq: Every day | CUTANEOUS | 0 refills | Status: DC
  Filled 2023-05-27: qty 30, 30d supply, fill #0

## 2023-05-27 NOTE — Progress Notes (Signed)
 [facility]   Angelica Ortiz, is a 58 y.o. female presents for a follow up after starting on for weight loss for 12 months. Patient states they have improved meal pattern, less frequent dining out, better food choices, adequate fluid intake (at least 6 cups of fluid per day), and watch portion sizes/amount of food eaten at one time. While on the tirzepatidethey have lost 18 lbs since last visit. They deny palpitations, anxiety, trouble sleeping, elevated BP.   BP Readings from Last 3 Encounters:  03/27/23 105/67  03/09/23 114/88  01/15/23 104/73    Wt Readings from Last 3 Encounters:  03/09/23 208 lb (94.3 kg)  01/15/23 211 lb 12.8 oz (96.1 kg)  11/13/22 224 lb 9.6 oz (101.9 kg)    Medications: Current Outpatient Medications on File Prior to Visit  Medication Sig Dispense Refill   albuterol (VENTOLIN HFA) 108 (90 Base) MCG/ACT inhaler Inhale 2 puffs into the lungs every 6 (six) hours as needed for shortness of breath or wheezing. 18 g 2   BIOTIN PO Take 1 tablet by mouth daily.     brimonidine (ALPHAGAN) 0.2 % ophthalmic solution 3 (three) times daily.     budesonide-formoterol (SYMBICORT) 160-4.5 MCG/ACT inhaler Inhale 2 puffs into the lungs daily. 10.2 g 3   CALCIUM PO Take 1 tablet by mouth daily.     clotrimazole-betamethasone (LOTRISONE) cream Apply 1 Application topically daily. 30 g 0   cyclobenzaprine (FLEXERIL) 10 MG tablet Take 1 tablet by mouth once daily as needed for severe muscle spasms 30 tablet 2   diclofenac Sodium (VOLTAREN) 1 % GEL Apply 2 g topically daily as needed. 350 g 1   DULoxetine (CYMBALTA) 30 MG capsule Take 30 mg by mouth daily. (Patient not taking: Reported on 03/09/2023)     ELDERBERRY PO Take 1 tablet by mouth daily.     fluconazole (DIFLUCAN) 150 MG tablet Take 1 tablet (150 mg total) by mouth every 3 (three) days as needed. 2 tablet 0   fluticasone (FLONASE) 50 MCG/ACT nasal spray Place 1 spray into both nostrils in the morning.  18 mL 3   Fremanezumab-vfrm (AJOVY) 225 MG/1.5ML SOAJ Inject 225 mg into the skin every 30 (thirty) days. 1.5 mL 11   lidocaine (LIDODERM) 5 % Place 3 patches onto the skin daily. 30 patch 3   linaclotide (LINZESS) 145 MCG CAPS capsule Take 1 capsule (145 mcg total) by mouth daily. 30 capsule 2   MAGNESIUM PO Take 1 tablet by mouth daily.     metoprolol tartrate (LOPRESSOR) 100 MG tablet Take 2 hours prior to CT 1 tablet 0   Multiple Vitamin (MULTIVITAMIN WITH MINERALS) TABS tablet Take 1 tablet by mouth 2 (two) times daily.     naloxone (NARCAN) nasal spray 4 mg/0.1 mL 1 spray every 3 minutes; spray 1 dose into ONE nostril; alternate nostrils w each dose until help arrives 2 each 1   naloxone (NARCAN) nasal spray 4 mg/0.1 mL Use 1 spray every 3 minutes; spray 1 dose into ONE nostril; alternate nostrils w each dose until help arrives 2 each 1   NURTEC 75 MG TBDP Take 1 tablet (75 mg total) by mouth daily as needed (migraine). 48 tablet 4   oxyCODONE-acetaminophen (PERCOCET) 10-325 MG tablet Take 1 tablet by mouth every 4 (four) to 6 (six)  hours as needed for pain. 180 tablet 0   oxyCODONE-acetaminophen (PERCOCET) 10-325 MG tablet Take 1 tablet by mouth  every 4 TO 6 HOURS AS NEEDED FOR PAIN. 180 tablet 0   phentermine 37.5 MG capsule Take 1 capsule (37.5 mg total) by mouth daily. 30 capsule 0   predniSONE (DELTASONE) 50 MG tablet Take 50 mg (one tablet) 13 hours before procedure. Take 50 mg (once tablet) 7 house before procedure. Take 50 mg (one tablet) before leaving home before procedure. 3 tablet 0   pregabalin (LYRICA) 50 MG capsule Take 1 capsule (50 mg total) by mouth 2 (two) times daily. 180 capsule 1   QUEtiapine (SEROQUEL) 25 MG tablet Take 1 tablet (25 mg total) by mouth at bedtime. 30 tablet 1   rosuvastatin (CRESTOR) 5 MG tablet Take 1 tablet (5 mg total) by mouth daily. 90 tablet 2   sulfamethoxazole-trimethoprim (BACTRIM DS) 800-160 MG tablet Take 1 tablet by mouth 2 (two) times daily.  10 tablet 0   tirzepatide (MOUNJARO) 15 MG/0.5ML Pen Inject 15 mg into the skin once a week. 6 mL 4   vitamin B-12 (CYANOCOBALAMIN) 100 MCG tablet Take 100 mcg by mouth daily.     No current facility-administered medications on file prior to visit.    ROS:   Denies any headaches, blurred vision, fatigue, shortness of breath, chest pain, abdominal pain, abnormal vaginal discharge/itching/odor/irritation, problems with periods, bowel movements, urination, or intercourse unless otherwise stated above.  Physical exam: General: Vital signs reviewed.  Patient is well-developed and well-nourished, morbid obese female  in no acute distress and cooperative with exam. Head: Normocephalic and atraumatic. Eyes: EOMI, conjunctivae normal, no scleral icterus. Neck: Supple, trachea midline, normal ROM, no JVD, masses, thyromegaly, or carotid bruit present. Cardiovascular: RRR, S1 normal, S2 normal, no murmurs, gallops, or rubs. Pulmonary/Chest: Clear to auscultation bilaterally, no wheezes, rales, or rhonchi. Abdominal: Soft, non-tender, non-distended, BS +, no masses, organomegaly, or guarding present. Musculoskeletal: No joint deformities, erythema, or stiffness, ROM full and nontender. Extremities: No lower extremity edema bilaterally,  pulses symmetric and intact bilaterally. No cyanosis or clubbing. Neurological: A&O x3, Strength is normal Skin: Warm, dry and intact. No rashes or erythema. Psychiatric: Normal mood and affect. speech and behavior is normal. Cognition and memory are normal.    Angelica Ortiz was seen today for prediabetes.  Diagnoses and all orders for this visit:  Prediabetes  Prediabetes is 5.7-6.4 monitor carbohydrates -rice, potatoes, tortillas, breads, pasta, sweets, sodas.  Increase exercising to help maintain appropriate weight.   Morbid obesity tirzepatidethey have lost 18 lbs since last visit  Adjustment insomnia -     QUEtiapine (SEROQUEL) 50 MG tablet; Take 1 tablet (50 mg  total) by mouth at bedtime.    This note has been created with Education officer, environmental. Any transcriptional errors are unintentional.   Grayce Sessions, NP 05/27/2023, 9:43 AM

## 2023-05-28 ENCOUNTER — Other Ambulatory Visit: Payer: Self-pay

## 2023-05-28 DIAGNOSIS — E119 Type 2 diabetes mellitus without complications: Secondary | ICD-10-CM | POA: Diagnosis not present

## 2023-05-28 DIAGNOSIS — Z79899 Other long term (current) drug therapy: Secondary | ICD-10-CM | POA: Diagnosis not present

## 2023-05-28 DIAGNOSIS — M5030 Other cervical disc degeneration, unspecified cervical region: Secondary | ICD-10-CM | POA: Diagnosis not present

## 2023-05-28 DIAGNOSIS — B49 Unspecified mycosis: Secondary | ICD-10-CM | POA: Diagnosis not present

## 2023-05-28 MED ORDER — OXYCODONE-ACETAMINOPHEN 10-325 MG PO TABS
1.0000 | ORAL_TABLET | ORAL | 0 refills | Status: DC
  Filled 2023-05-28: qty 180, 30d supply, fill #0

## 2023-05-28 MED ORDER — NYSTATIN 100000 UNIT/GM EX POWD
CUTANEOUS | 2 refills | Status: DC
Start: 2023-05-28 — End: 2023-11-05
  Filled 2023-05-28: qty 60, 30d supply, fill #0
  Filled 2023-06-22: qty 60, 30d supply, fill #1
  Filled 2023-07-15: qty 60, 30d supply, fill #2

## 2023-05-28 MED ORDER — PHENTERMINE HCL 30 MG PO CAPS
30.0000 mg | ORAL_CAPSULE | Freq: Every day | ORAL | 0 refills | Status: DC
  Filled 2023-05-28: qty 30, 30d supply, fill #0

## 2023-05-29 ENCOUNTER — Ambulatory Visit: Payer: 59 | Admitting: Cardiology

## 2023-06-01 DIAGNOSIS — Z79899 Other long term (current) drug therapy: Secondary | ICD-10-CM | POA: Diagnosis not present

## 2023-06-02 ENCOUNTER — Encounter: Payer: Self-pay | Admitting: Cardiology

## 2023-06-02 ENCOUNTER — Ambulatory Visit: Payer: 59 | Attending: Cardiology | Admitting: Cardiology

## 2023-06-02 VITALS — BP 102/80 | HR 69 | Ht 65.0 in | Wt 196.2 lb

## 2023-06-02 DIAGNOSIS — I10 Essential (primary) hypertension: Secondary | ICD-10-CM | POA: Diagnosis not present

## 2023-06-02 DIAGNOSIS — E119 Type 2 diabetes mellitus without complications: Secondary | ICD-10-CM | POA: Diagnosis not present

## 2023-06-02 DIAGNOSIS — Z794 Long term (current) use of insulin: Secondary | ICD-10-CM | POA: Diagnosis not present

## 2023-06-02 DIAGNOSIS — E782 Mixed hyperlipidemia: Secondary | ICD-10-CM

## 2023-06-02 NOTE — Patient Instructions (Signed)
 Medication Instructions:  Your physician recommends that you continue on your current medications as directed. Please refer to the Current Medication list given to you today.  *If you need a refill on your cardiac medications before your next appointment, please call your pharmacy*  Follow-Up: At [facility], you and your health needs are our priority.  As part of our continuing mission to provide you with exceptional heart care, we have created designated Provider Care Teams.  These Care Teams include your primary Cardiologist (physician) and Advanced Practice Providers (APPs -  Physician Assistants and Nurse Practitioners) who all work together to provide you with the care you need, when you need it.   Your next appointment:   1 year(s)  Provider:   Thomasene Ripple, DO

## 2023-06-02 NOTE — Progress Notes (Signed)
 Cardiology Office Note:    Date:  06/02/2023   ID:  Angelica Ortiz, DOB [birth year], MRN [id number]  PCP:  Grayce Sessions, NP  Cardiologist:  Thomasene Ripple, DO  Electrophysiologist:  None   Referring MD: Grayce Sessions, NP     History of Present Illness:    Angelica Ortiz is a 57 y.o. female with a hx of type 2 diabetes, asthma, OSA on CPAP, hypertension, hyperlipidemia, pulmonary embolism no longer on anticoagulation.  At her last visit she was experiencing chest discomfort acidification for coronary CTA with show to be evidence of coronary artery disease.  Since her visit with me she has been doing well.  She offers no complaints at this time.  She is excited about her weight loss journey.  Past Medical History:  Diagnosis Date   Anemia    Arthritis    Asthma    Depression    Diabetes mellitus without complication (HCC)    Type 2   Diverticulitis    Falls    Fibromyalgia    Gait instability    GERD (gastroesophageal reflux disease)    History of urinary tract infection    Hypertension    Lupus    no meds   Migraine    Morbid obesity 03/30/2013   Overactive bladder    Overactive bladder    Pulmonary embolism (HCC)    Resolved -At age 47 broken right leg, blood clot broke off - into lung   Seasonal allergies    Sleep apnea 06/05/2013   Does not have CPAP machine   Urinary incontinence     Past Surgical History:  Procedure Laterality Date   ABDOMINAL HYSTERECTOMY     APPENDECTOMY     CESAREAN SECTION  04/08/1999   x 1   CHOLECYSTECTOMY     DILATION AND CURETTAGE OF UTERUS     x 3 for AUB   GASTRIC ROUX-EN-Y N/A 03/13/2015   Procedure: LAPAROSCOPIC ROUX-EN-Y GASTRIC BYPASS  ENTEROLYSIS OF ADHESIONS WITH UPPER ENDOSCOPY;  Surgeon: Ovidio Kin, MD;  Location: [facility];  Service: General;  Laterality: N/A;   HYSTEROSCOPY WITH NOVASURE N/A 03/14/2014   Procedure: HYSTEROSCOPY WITH NOVASURE;  Surgeon: Willodean Rosenthal, MD;  Location: [facility];  Service:  Gynecology;  Laterality: N/A;   LAMINECTOMY     L4-L5   LAPAROSCOPIC LYSIS OF ADHESIONS  03/13/2015   Procedure: LAPAROSCOPIC LYSIS OF ADHESIONS;  Surgeon: Ovidio Kin, MD;  Location: [facility];  Service: General;;   LAPAROSCOPIC OOPHERECTOMY Right 09/05/2014   Procedure: abdominal OOPHERECTOMY;  Surgeon: Willodean Rosenthal, MD;  Location: [facility];  Service: Gynecology;  Laterality: Right;   LAPAROSCOPIC OVARIAN CYSTECTOMY Right 04/07/1992   ORIF ANKLE FRACTURE Left    OVARIAN CYST REMOVAL Left 09/05/2014   Procedure: OVARIAN CYSTECTOMY and portion of left ovary removed ;  Surgeon: Willodean Rosenthal, MD;  Location: [facility];  Service: Gynecology;  Laterality: Left;   ROTATOR CUFF REPAIR     right   ROTATOR CUFF REPAIR Right    SALPINGOOPHORECTOMY Bilateral 09/05/2014   Procedure: BILATERAL SALPINGECTOMY;  Surgeon: Willodean Rosenthal, MD;  Location: [facility];  Service: Gynecology;  Laterality: Bilateral;   SHOULDER ARTHROSCOPY WITH ROTATOR CUFF REPAIR AND SUBACROMIAL DECOMPRESSION Left 11/01/2020   Procedure: SHOULDER ARTHROSCOPY WITH ROTATOR CUFF REPAIR AND SUBACROMIAL DECOMPRESSION, DISTAL CLAVICLE EXCISION;  Surgeon: Jones Broom, MD;  Location: [facility];  Service: Orthopedics;  Laterality: Left;   SUPRACERVICAL ABDOMINAL HYSTERECTOMY N/A 09/05/2014   Procedure: HYSTERECTOMY SUPRACERVICAL ABDOMINAL;  Surgeon:  Willodean Rosenthal, MD;  Location: [facility];  Service: Gynecology;  Laterality: N/A;   TONSILLECTOMY     UPPER GI ENDOSCOPY  03/13/2015   Procedure: UPPER GI ENDOSCOPY;  Surgeon: Ovidio Kin, MD;  Location: [facility];  Service: General;;    Current Medications: Current Meds  Medication Sig   albuterol (VENTOLIN HFA) 108 (90 Base) MCG/ACT inhaler Inhale 2 puffs into the lungs every 6 (six) hours as needed for shortness of breath or wheezing.   BIOTIN PO Take 1 tablet by mouth daily.   brimonidine (ALPHAGAN) 0.2 % ophthalmic solution 3 (three) times daily.    budesonide-formoterol (SYMBICORT) 160-4.5 MCG/ACT inhaler Inhale 2 puffs into the lungs daily.   CALCIUM PO Take 1 tablet by mouth daily.   clotrimazole-betamethasone (LOTRISONE) cream Apply 1 Application topically daily.   diclofenac Sodium (VOLTAREN) 1 % GEL Apply 2 g topically daily as needed.   ELDERBERRY PO Take 1 tablet by mouth daily.   fluticasone (FLONASE) 50 MCG/ACT nasal spray Place 1 spray into both nostrils in the morning.   Fremanezumab-vfrm (AJOVY) 225 MG/1.5ML SOAJ Inject 225 mg into the skin every 30 (thirty) days.   lidocaine (LIDODERM) 5 % Place 3 patches onto the skin daily.   linaclotide (LINZESS) 145 MCG CAPS capsule Take 1 capsule (145 mcg total) by mouth daily.   MAGNESIUM PO Take 1 tablet by mouth daily.   Multiple Vitamin (MULTIVITAMIN WITH MINERALS) TABS tablet Take 1 tablet by mouth 2 (two) times daily.   naloxone (NARCAN) nasal spray 4 mg/0.1 mL Use 1 spray every 3 minutes; spray 1 dose into ONE nostril; alternate nostrils w each dose until help arrives   NURTEC 75 MG TBDP Take 1 tablet (75 mg total) by mouth daily as needed (migraine).   nystatin (MYCOSTATIN/NYSTOP) powder Apply 1 application to (affected) skin 2 times per day   oxyCODONE-acetaminophen (PERCOCET) 10-325 MG tablet Take 1 tablet by mouth every 4 to 6 hours as needed for pain.   phentermine 30 MG capsule Take 1 capsule (30 mg total) by mouth daily.   pregabalin (LYRICA) 50 MG capsule Take 1 capsule (50 mg total) by mouth 2 (two) times daily.   QUEtiapine (SEROQUEL) 50 MG tablet Take 1 tablet (50 mg total) by mouth at bedtime.   rosuvastatin (CRESTOR) 5 MG tablet Take 1 tablet (5 mg total) by mouth daily.   tirzepatide (MOUNJARO) 15 MG/0.5ML Pen Inject 15 mg into the skin once a week.   vitamin B-12 (CYANOCOBALAMIN) 100 MCG tablet Take 100 mcg by mouth daily.     Allergies:   Dilaudid [hydromorphone hcl], Iodinated contrast media, Levaquin [levofloxacin], Tangerine flavoring agent (non-screening), Ace  inhibitors, and Iodine-131   Social History   Socioeconomic History   Marital status: Divorced    Spouse name: Not on file   Number of children: Not on file   Years of education: Not on file   Highest education level: Not on file  Occupational History   Not on file  Tobacco Use   Smoking status: Never   Smokeless tobacco: Never  Vaping Use   Vaping status: Never Used  Substance and Sexual Activity   Alcohol use: No   Drug use: No   Sexual activity: Not Currently    Birth control/protection: None  Other Topics Concern   Not on file  Social History Narrative   Not on file   Social Drivers of Health   Financial Resource Strain: Low Risk  (11/29/2021)   Overall Physicist, medical Strain (  CARDIA)    Difficulty of Paying Living Expenses: Not hard at all  Food Insecurity: No Food Insecurity (11/29/2021)   Hunger Vital Sign    Worried About Running Out of Food in the Last Year: Never true    Ran Out of Food in the Last Year: Never true  Transportation Needs: Unmet Transportation Needs (11/29/2021)   PRAPARE - Transportation    Lack of Transportation (Medical): Yes    Lack of Transportation (Non-Medical): Yes  Physical Activity: Inactive (11/29/2021)   Exercise Vital Sign    Days of Exercise per Week: 0 days    Minutes of Exercise per Session: 0 min  Stress: No Stress Concern Present (11/29/2021)   [employer] of Occupational Health - Occupational Stress Questionnaire    Feeling of Stress : Not at all  Social Connections: Moderately Integrated (11/29/2021)   Social Connection and Isolation Panel [NHANES]    Frequency of Communication with Friends and Family: More than three times a week    Frequency of Social Gatherings with Friends and Family: More than three times a week    Attends Religious Services: More than 4 times per year    Active Member of [employer] or Organizations: Yes    Attends Engineer, structural: More than 4 times per year    Marital Status: Divorced      Family History: The patient's family history includes Cancer in her father, maternal uncle, paternal uncle, and sister; Diabetes in her brother and mother; Heart disease in her father and mother; Hypertension in her brother, mother, and sister. There is no history of Breast cancer.  ROS:   Review of Systems  Constitution: Negative for decreased appetite, fever and weight gain.  HENT: Negative for congestion, ear discharge, hoarse voice and sore throat.   Eyes: Negative for discharge, redness, vision loss in right eye and visual halos.  Cardiovascular: Negative for chest pain, dyspnea on exertion, leg swelling, orthopnea and palpitations.  Respiratory: Negative for cough, hemoptysis, shortness of breath and snoring.   Endocrine: Negative for heat intolerance and polyphagia.  Hematologic/Lymphatic: Negative for bleeding problem. Does not bruise/bleed easily.  Skin: Negative for flushing, nail changes, rash and suspicious lesions.  Musculoskeletal: Negative for arthritis, joint pain, muscle cramps, myalgias, neck pain and stiffness.  Gastrointestinal: Negative for abdominal pain, bowel incontinence, diarrhea and excessive appetite.  Genitourinary: Negative for decreased libido, genital sores and incomplete emptying.  Neurological: Negative for brief paralysis, focal weakness, headaches and loss of balance.  Psychiatric/Behavioral: Negative for altered mental status, depression and suicidal ideas.  Allergic/Immunologic: Negative for HIV exposure and persistent infections.    EKGs/Labs/Other Studies Reviewed:    The following studies were reviewed today:   EKG:  The ekg ordered today demonstrates   Recent Labs: 01/15/2023: Hemoglobin 13.3; Platelets 226 03/23/2023: ALT 23; BUN 15; Creatinine, Ser 0.67; Magnesium 2.1; Potassium 3.9; Sodium 143  Recent Lipid Panel    Component Value Date/Time   CHOL 159 01/15/2023 1540   TRIG 94 01/15/2023 1540   HDL 60 01/15/2023 1540   CHOLHDL  2.7 01/15/2023 1540   LDLCALC 82 01/15/2023 1540    Physical Exam:    VS:  BP 102/80 (BP Location: Right Arm, Patient Position: Sitting, Cuff Size: Normal)   Pulse 69   Ht 5\' 5"  (1.651 m)   Wt 196 lb 3.2 oz (89 kg)   LMP  (LMP Unknown) Comment: full hysterectomy  SpO2 98%   BMI 32.65 kg/m     Wt Readings from  Last 3 Encounters:  06/02/23 196 lb 3.2 oz (89 kg)  05/27/23 190 lb (86.2 kg)  03/09/23 208 lb (94.3 kg)     GEN: Well nourished, well developed in no acute distress HEENT: Normal NECK: No JVD; No carotid bruits LYMPHATICS: No lymphadenopathy CARDIAC: S1S2 noted,RRR, no murmurs, rubs, gallops RESPIRATORY:  Clear to auscultation without rales, wheezing or rhonchi  ABDOMEN: Soft, non-tender, non-distended, +bowel sounds, no guarding. EXTREMITIES: No edema, No cyanosis, no clubbing MUSCULOSKELETAL:  No deformity  SKIN: Warm and dry NEUROLOGIC:  Alert and oriented x 3, non-focal PSYCHIATRIC:  Normal affect, good insight  ASSESSMENT:    1. Insulin-requiring or dependent type II diabetes mellitus (HCC)   2. Mixed hyperlipidemia   3. Hypertension, well controlled   4. Morbid obesity (HCC)    PLAN:     Stable from a cardiovascular standpoint.  She will follow-up with her PCP for management of her diabetes. Agree with the use of Crestor in this patient. Blood pressure is controlled.  No change in medication at this time.  The patient understands the need to lose weight with diet and exercise. We have discussed specific strategies for this.  The patient is in agreement with the above plan. The patient left the office in stable condition.  The patient will follow up in   Medication Adjustments/Labs and Tests Ordered: Current medicines are reviewed at length with the patient today.  Concerns regarding medicines are outlined above.  No orders of the defined types were placed in this encounter.  No orders of the defined types were placed in this encounter.   There  are no Patient Instructions on file for this visit.   Adopting a Healthy Lifestyle.  Know what a healthy weight is for you (roughly BMI <25) and aim to maintain this   Aim for 7+ servings of fruits and vegetables daily   65-80+ fluid ounces of water or unsweet tea for healthy kidneys   Limit to max 1 drink of alcohol per day; avoid smoking/tobacco   Limit animal fats in diet for cholesterol and heart health - choose grass fed whenever available   Avoid highly processed foods, and foods high in saturated/trans fats   Aim for low stress - take time to unwind and care for your mental health   Aim for 150 min of moderate intensity exercise weekly for heart health, and weights twice weekly for bone health   Aim for 7-9 hours of sleep daily   When it comes to diets, agreement about the perfect plan isnt easy to find, even among the experts. Experts at the [employer] developed an idea known as the Healthy Eating Plate. Just imagine a plate divided into logical, healthy portions.   The emphasis is on diet quality:   Load up on vegetables and fruits - one-half of your plate: Aim for color and variety, and remember that potatoes dont count.   Go for whole grains - one-quarter of your plate: Whole wheat, barley, wheat berries, quinoa, oats, brown rice, and foods made with them. If you want pasta, go with whole wheat pasta.   Protein power - one-quarter of your plate: Fish, chicken, beans, and nuts are all healthy, versatile protein sources. Limit red meat.   The diet, however, does go beyond the plate, offering a few other suggestions.   Use healthy plant oils, such as olive, canola, soy, corn, sunflower and peanut. Check the labels, and avoid partially hydrogenated oil, which have unhealthy trans  fats.   If youre thirsty, drink water. Coffee and tea are good in moderation, but skip sugary drinks and limit milk and dairy products to one or two daily servings.   The  type of carbohydrate in the diet is more important than the amount. Some sources of carbohydrates, such as vegetables, fruits, whole grains, and beans-are healthier than others.   Finally, stay active  Signed, Thomasene Ripple, DO  06/02/2023 10:45 AM    [facility]

## 2023-06-09 ENCOUNTER — Other Ambulatory Visit: Payer: Self-pay

## 2023-06-10 ENCOUNTER — Other Ambulatory Visit: Payer: Self-pay

## 2023-06-22 ENCOUNTER — Telehealth: Admitting: Physician Assistant

## 2023-06-22 ENCOUNTER — Other Ambulatory Visit: Payer: Self-pay

## 2023-06-22 DIAGNOSIS — K219 Gastro-esophageal reflux disease without esophagitis: Secondary | ICD-10-CM

## 2023-06-22 MED ORDER — OMEPRAZOLE 20 MG PO CPDR
20.0000 mg | DELAYED_RELEASE_CAPSULE | Freq: Every day | ORAL | 0 refills | Status: DC
Start: 2023-06-22 — End: 2024-01-20
  Filled 2023-06-22: qty 30, 30d supply, fill #0

## 2023-06-22 NOTE — Progress Notes (Signed)
 I have spent 5 minutes in review of e-visit questionnaire, review and updating patient chart, medical decision making and response to patient.   Piedad Climes, PA-C

## 2023-06-22 NOTE — Progress Notes (Signed)
 E-Visit for Heartburn  We are sorry that you are not feeling well.  Here is how we plan to help!  Based on what you shared with me it looks like you most likely have Gastroesophageal Reflux Disease (GERD)  Gastroesophageal reflux disease (GERD) happens when acid from your stomach flows up into the esophagus.  When acid comes in contact with the esophagus, the acid causes sorenss (inflammation) in the esophagus.  Over time, GERD may create small holes (ulcers) in the lining of the esophagus.  I have prescribed Omeprazole 20 mg one by mouth daily until you follow up with a provider. I recommend you contact your PCP to schedule follow-up.  Your symptoms should improve in the next day or two.  You can use antacids as needed until symptoms resolve.  Call us if your heartburn worsens, you have trouble swallowing, weight loss, spitting up blood or recurrent vomiting.  Home Care: May include lifestyle changes such as weight loss, quitting smoking and alcohol consumption Avoid foods and drinks that make your symptoms worse, such as: Caffeine or alcoholic drinks Chocolate Peppermint or mint flavorings Garlic and onions Spicy foods Citrus fruits, such as oranges, lemons, or limes Tomato-based foods such as sauce, chili, salsa and pizza Fried and fatty foods Avoid lying down for 3 hours prior to your bedtime or prior to taking a nap Eat small, frequent meals instead of a large meals Wear loose-fitting clothing.  Do not wear anything tight around your waist that causes pressure on your stomach. Raise the head of your bed 6 to 8 inches with wood blocks to help you sleep.  Extra pillows will not help.  Seek Help Right Away If: You have pain in your arms, neck, jaw, teeth or back Your pain increases or changes in intensity or duration You develop nausea, vomiting or sweating (diaphoresis) You develop shortness of breath or you faint Your vomit is green, yellow, black or looks like coffee grounds or  blood Your stool is red, bloody or black  These symptoms could be signs of other problems, such as heart disease, gastric bleeding or esophageal bleeding.  Make sure you : Understand these instructions. Will watch your condition. Will get help right away if you are not doing well or get worse.  Your e-visit answers were reviewed by a board certified advanced clinical practitioner to complete your personal care plan.  Depending on the condition, your plan could have included both over the counter or prescription medications.  If there is a problem please reply  once you have received a response from your provider.  Your safety is important to [place].  If you have drug allergies check your prescription carefully.    You can use MyChart to ask questions about today's visit, request a non-urgent call back, or ask for a work or school excuse for 24 hours related to this e-Visit. If it has been greater than 24 hours you will need to follow up with your provider, or enter a new e-Visit to address those concerns.  You will get an e-mail in the next two days asking about your experience.  I hope that your e-visit has been valuable and will speed your recovery. Thank you for using e-visits.

## 2023-06-23 ENCOUNTER — Other Ambulatory Visit: Payer: Self-pay

## 2023-06-25 ENCOUNTER — Encounter (INDEPENDENT_AMBULATORY_CARE_PROVIDER_SITE_OTHER): Payer: Self-pay | Admitting: Primary Care

## 2023-06-25 ENCOUNTER — Other Ambulatory Visit: Payer: Self-pay

## 2023-06-25 DIAGNOSIS — M25561 Pain in right knee: Secondary | ICD-10-CM | POA: Diagnosis not present

## 2023-06-25 DIAGNOSIS — Z79899 Other long term (current) drug therapy: Secondary | ICD-10-CM | POA: Diagnosis not present

## 2023-06-25 DIAGNOSIS — M5030 Other cervical disc degeneration, unspecified cervical region: Secondary | ICD-10-CM | POA: Diagnosis not present

## 2023-06-25 DIAGNOSIS — M25562 Pain in left knee: Secondary | ICD-10-CM | POA: Diagnosis not present

## 2023-06-25 DIAGNOSIS — G8929 Other chronic pain: Secondary | ICD-10-CM | POA: Diagnosis not present

## 2023-06-25 DIAGNOSIS — E1169 Type 2 diabetes mellitus with other specified complication: Secondary | ICD-10-CM | POA: Diagnosis not present

## 2023-06-25 DIAGNOSIS — K5909 Other constipation: Secondary | ICD-10-CM | POA: Diagnosis not present

## 2023-06-25 DIAGNOSIS — M545 Low back pain, unspecified: Secondary | ICD-10-CM | POA: Diagnosis not present

## 2023-06-25 DIAGNOSIS — E559 Vitamin D deficiency, unspecified: Secondary | ICD-10-CM | POA: Diagnosis not present

## 2023-06-25 DIAGNOSIS — E119 Type 2 diabetes mellitus without complications: Secondary | ICD-10-CM | POA: Diagnosis not present

## 2023-06-25 DIAGNOSIS — M129 Arthropathy, unspecified: Secondary | ICD-10-CM | POA: Diagnosis not present

## 2023-06-25 MED ORDER — NALOXONE HCL 4 MG/0.1ML NA LIQD
NASAL | 3 refills | Status: DC
  Filled 2023-06-25: qty 2, 30d supply, fill #0

## 2023-06-25 MED ORDER — PHENTERMINE HCL 30 MG PO CAPS
30.0000 mg | ORAL_CAPSULE | Freq: Every day | ORAL | 0 refills | Status: DC
  Filled 2023-06-25: qty 30, 30d supply, fill #0

## 2023-06-25 MED ORDER — LINZESS 290 MCG PO CAPS
290.0000 ug | ORAL_CAPSULE | Freq: Every day | ORAL | 0 refills | Status: DC
  Filled 2023-06-25: qty 30, 30d supply, fill #0

## 2023-06-25 MED ORDER — OXYCODONE-ACETAMINOPHEN 10-325 MG PO TABS
1.0000 | ORAL_TABLET | ORAL | 0 refills | Status: AC
Start: 2023-06-25 — End: ?
  Filled 2023-06-25: qty 180, 30d supply, fill #0

## 2023-06-29 DIAGNOSIS — Z79899 Other long term (current) drug therapy: Secondary | ICD-10-CM | POA: Diagnosis not present

## 2023-06-30 ENCOUNTER — Telehealth (INDEPENDENT_AMBULATORY_CARE_PROVIDER_SITE_OTHER): Payer: Self-pay | Admitting: Primary Care

## 2023-06-30 NOTE — Telephone Encounter (Signed)
 Spoke to pt about appt.. Will be present

## 2023-07-03 ENCOUNTER — Other Ambulatory Visit: Payer: Self-pay

## 2023-07-08 ENCOUNTER — Ambulatory Visit (INDEPENDENT_AMBULATORY_CARE_PROVIDER_SITE_OTHER): Payer: 59 | Admitting: Primary Care

## 2023-07-08 ENCOUNTER — Other Ambulatory Visit: Payer: Self-pay

## 2023-07-08 VITALS — BP 131/87 | HR 75 | Wt 189.8 lb

## 2023-07-08 DIAGNOSIS — N951 Menopausal and female climacteric states: Secondary | ICD-10-CM | POA: Diagnosis not present

## 2023-07-08 DIAGNOSIS — R300 Dysuria: Secondary | ICD-10-CM

## 2023-07-08 MED ORDER — ESTROGENS CONJUGATED 0.625 MG/GM VA CREA
1.0000 | TOPICAL_CREAM | Freq: Every day | VAGINAL | 12 refills | Status: AC
  Filled 2023-07-08: qty 60, 30d supply, fill #0
  Filled 2023-08-07: qty 60, 30d supply, fill #1
  Filled 2023-09-06: qty 60, 30d supply, fill #2
  Filled 2023-10-16: qty 60, 30d supply, fill #3
  Filled 2023-11-16: qty 60, 30d supply, fill #4

## 2023-07-13 ENCOUNTER — Encounter (INDEPENDENT_AMBULATORY_CARE_PROVIDER_SITE_OTHER): Payer: Self-pay | Admitting: Primary Care

## 2023-07-13 ENCOUNTER — Other Ambulatory Visit: Payer: Self-pay

## 2023-07-13 NOTE — Progress Notes (Signed)
 [facility]  Angelica Ortiz, is a 58 y.o. female  [id number]  [id number]  DOB - 16-Sep-1965  Chief Complaint  Patient presents with   Medical Management of Chronic Issues    Constipation and UTI        Subjective:   Angelica Ortiz is a 58 y.o. female here today for an acute visit. She is concern vaginal dryness and painful sex. Also, has some dysuria. Constipation -Linzess increased 290mg  increase fiber and water.   No problems updated.  Comprehensive ROS Pertinent positive and negative noted in HPI   Allergies  Allergen Reactions   Dilaudid [Hydromorphone Hcl] Hives and Itching   Iodinated Contrast Media Hives and Itching    Pt given 4 hour prep 06/21/20, c/o itching after injection- [facility]. 03/27/23: reacted to 13 HOUR PREP, c/o itching after receiving contrast.    Levaquin [Levofloxacin] Anaphylaxis   Tangerine Flavoring Agent (Non-Screening) Swelling    Tangerine fruit: Lip and tongue swelling with tangerines   Ace Inhibitors Swelling   Iodine-131 Itching and Rash    Past Medical History:  Diagnosis Date   Anemia    Arthritis    Asthma    Depression    Diabetes mellitus without complication (HCC)    Type 2   Diverticulitis    Falls    Fibromyalgia    Gait instability    GERD (gastroesophageal reflux disease)    History of urinary tract infection    Hypertension    Lupus    no meds   Migraine    Morbid obesity 03/30/2013   Overactive bladder    Overactive bladder    Pulmonary embolism (HCC)    Resolved -At age 3 broken right leg, blood clot broke off - into lung   Seasonal allergies    Sleep apnea 06/05/2013   Does not have CPAP machine   Urinary incontinence     Current Outpatient Medications on File Prior to Visit  Medication Sig Dispense Refill   albuterol (VENTOLIN HFA) 108 (90 Base) MCG/ACT inhaler Inhale 2 puffs into the lungs every 6 (six) hours as needed for shortness of breath or wheezing. 18 g 2   BIOTIN PO Take 1 tablet by  mouth daily.     brimonidine (ALPHAGAN) 0.2 % ophthalmic solution 3 (three) times daily.     budesonide-formoterol (SYMBICORT) 160-4.5 MCG/ACT inhaler Inhale 2 puffs into the lungs daily. 10.2 g 3   CALCIUM PO Take 1 tablet by mouth daily.     clotrimazole-betamethasone (LOTRISONE) cream Apply 1 Application topically daily. 30 g 0   diclofenac Sodium (VOLTAREN) 1 % GEL Apply 2 g topically daily as needed. 350 g 1   ELDERBERRY PO Take 1 tablet by mouth daily.     fluticasone (FLONASE) 50 MCG/ACT nasal spray Place 1 spray into both nostrils in the morning. 18 mL 3   Fremanezumab-vfrm (AJOVY) 225 MG/1.5ML SOAJ Inject 225 mg into the skin every 30 (thirty) days. 1.5 mL 11   lidocaine (LIDODERM) 5 % Place 3 patches onto the skin daily. 30 patch 3   linaclotide (LINZESS) 290 MCG CAPS capsule Take 1 capsule (290 mcg total) by mouth daily. 30 capsule 0   MAGNESIUM PO Take 1 tablet by mouth daily.     Multiple Vitamin (MULTIVITAMIN WITH MINERALS) TABS tablet Take 1 tablet by mouth 2 (two) times daily.     naloxone (NARCAN) nasal spray 4 mg/0.1 mL Use 1 spray every 3 minutes; spray 1 dose into ONE nostril; alternate nostrils  w each dose until help arrives 2 each 1   NURTEC 75 MG TBDP Take 1 tablet (75 mg total) by mouth daily as needed (migraine). 48 tablet 4   nystatin (MYCOSTATIN/NYSTOP) powder Apply 1 application to (affected) skin 2 times per day 60 g 2   omeprazole (PRILOSEC) 20 MG capsule Take 1 capsule (20 mg total) by mouth daily. 30 capsule 0   oxyCODONE-acetaminophen (PERCOCET) 10-325 MG tablet Take 1 tablet by mouth 4 to 6 hours as needed for pain. 180 tablet 0   phentermine 30 MG capsule Take 1 capsule (30 mg total) by mouth daily. 30 capsule 0   phentermine 37.5 MG capsule Take 1 capsule (37.5 mg total) by mouth daily. 30 capsule 0   pregabalin (LYRICA) 50 MG capsule Take 1 capsule (50 mg total) by mouth 2 (two) times daily. 180 capsule 1   QUEtiapine (SEROQUEL) 50 MG tablet Take 1 tablet (50  mg total) by mouth at bedtime. 90 tablet 1   rosuvastatin (CRESTOR) 5 MG tablet Take 1 tablet (5 mg total) by mouth daily. 90 tablet 2   tirzepatide (MOUNJARO) 15 MG/0.5ML Pen Inject 15 mg into the skin once a week. 6 mL 4   vitamin B-12 (CYANOCOBALAMIN) 100 MCG tablet Take 100 mcg by mouth daily.     linaclotide (LINZESS) 145 MCG CAPS capsule Take 1 capsule (145 mcg total) by mouth daily. 30 capsule 2   naloxone (NARCAN) nasal spray 4 mg/0.1 mL Use 1 spray every 3 minutes; spray 1 dose into ONE nostril; alternate nostrils w each dose until help arrives. 2 each 3   No current facility-administered medications on file prior to visit.   Health Maintenance  Topic Date Due   DTaP/Tdap/Td vaccine (1 - Tdap) Never done   Zoster (Shingles) Vaccine (1 of 2) 06/11/1986   Pap with HPV screening  06/06/2016   Pneumococcal Vaccination (2 of 2 - PCV) 12/06/2021   Medicare Annual Wellness Visit  11/30/2022   COVID-19 Vaccine (5 - 2024-25 season) 12/07/2022   Complete foot exam   05/02/2023   Hemoglobin A1C  07/16/2023   Mammogram  08/30/2023   Eye exam for diabetics  10/16/2023   Flu Shot  11/06/2023   Yearly kidney health urinalysis for diabetes  01/15/2024   Yearly kidney function blood test for diabetes  03/22/2024   Colon Cancer Screening  07/05/2024   Hepatitis C Screening  Completed   HIV Screening  Completed   HPV Vaccine  Aged Out    Objective:   Vitals:   07/08/23 1005  BP: 131/87  Pulse: 75  SpO2: 100%  Weight: 189 lb 12.8 oz (86.1 kg)   BP Readings from Last 3 Encounters:  07/08/23 131/87  06/02/23 102/80  05/27/23 125/83      Physical Exam Vitals reviewed.  Constitutional:      Appearance: Normal appearance. She is obese.  HENT:     Head: Normocephalic.     Right Ear: Tympanic membrane, ear canal and external ear normal.     Left Ear: Tympanic membrane, ear canal and external ear normal.     Nose: Nose normal.     Mouth/Throat:     Mouth: Mucous membranes are  moist.  Eyes:     Extraocular Movements: Extraocular movements intact.     Pupils: Pupils are equal, round, and reactive to light.  Cardiovascular:     Rate and Rhythm: Normal rate and regular rhythm.  Pulmonary:     Effort: Pulmonary effort  is normal.     Breath sounds: Normal breath sounds.  Abdominal:     General: Bowel sounds are normal.     Palpations: Abdomen is soft.  Musculoskeletal:        General: Normal range of motion.     Cervical back: Normal range of motion and neck supple.  Skin:    General: Skin is warm and dry.  Neurological:     Mental Status: She is alert and oriented to person, place, and time.  Psychiatric:        Mood and Affect: Mood normal.        Behavior: Behavior normal.        Thought Content: Thought content normal.      Assessment & Plan  Angelica Ortiz was seen today for medical management of chronic issues.  Diagnoses and all orders for this visit:  Vaginal dryness, menopausal -     conjugated estrogens (PREMARIN) vaginal cream; Place 1 Applicatorful vaginally daily    Patient have been counseled extensively about nutrition and exercise. Other issues discussed during this visit include: low cholesterol diet, weight control and daily exercise, foot care, annual eye examinations at Ophthalmology, importance of adherence with medications and regular follow-up. We also discussed long term complications of uncontrolled diabetes and hypertension.   Return in about 3 months (around 10/07/2023).  The patient was given clear instructions to go to ER or return to medical center if symptoms don't improve, worsen or new problems develop. The patient verbalized understanding. The patient was told to call to get lab results if they haven't heard anything in the next week.   This note has been created with Education officer, environmental. Any transcriptional errors are unintentional.   Grayce Sessions, NP 07/13/2023, 9:38 PM

## 2023-07-14 ENCOUNTER — Other Ambulatory Visit (HOSPITAL_COMMUNITY): Payer: Self-pay

## 2023-07-15 ENCOUNTER — Other Ambulatory Visit (INDEPENDENT_AMBULATORY_CARE_PROVIDER_SITE_OTHER): Payer: Self-pay | Admitting: Primary Care

## 2023-07-15 ENCOUNTER — Other Ambulatory Visit: Payer: Self-pay

## 2023-07-16 ENCOUNTER — Other Ambulatory Visit (INDEPENDENT_AMBULATORY_CARE_PROVIDER_SITE_OTHER): Payer: Self-pay | Admitting: Primary Care

## 2023-07-16 ENCOUNTER — Other Ambulatory Visit: Payer: Self-pay

## 2023-07-16 DIAGNOSIS — K219 Gastro-esophageal reflux disease without esophagitis: Secondary | ICD-10-CM

## 2023-07-16 MED ORDER — LIDOCAINE 5 % EX PTCH
3.0000 | MEDICATED_PATCH | Freq: Every day | CUTANEOUS | 3 refills | Status: DC
  Filled 2023-07-16: qty 30, 10d supply, fill #0
  Filled 2023-07-22 – 2023-07-23 (×2): qty 30, 10d supply, fill #1
  Filled 2023-08-05: qty 30, 10d supply, fill #2
  Filled 2023-08-18: qty 30, 10d supply, fill #3

## 2023-07-16 NOTE — Telephone Encounter (Signed)
 Requested Prescriptions  Pending Prescriptions Disp Refills   lidocaine (LIDODERM) 5 % 30 patch 3    Sig: Place 3 patches onto the skin daily.     Analgesics:  Topicals Failed - 07/16/2023 10:31 AM      Failed - Manual Review: Labs are only required if the patient has taken medication for more than 8 weeks.      Passed - PLT in normal range and within 360 days    Platelets  Date Value Ref Range Status  01/15/2023 226 150 - 450 x10E3/uL Final         Passed - HGB in normal range and within 360 days    Hemoglobin  Date Value Ref Range Status  01/15/2023 13.3 11.1 - 15.9 g/dL Final         Passed - HCT in normal range and within 360 days    Hematocrit  Date Value Ref Range Status  01/15/2023 41.3 34.0 - 46.6 % Final         Passed - Cr in normal range and within 360 days    Creatinine, Ser  Date Value Ref Range Status  03/23/2023 0.67 0.57 - 1.00 mg/dL Final         Passed - eGFR is 30 or above and within 360 days    GFR calc Af Amer  Date Value Ref Range Status  03/09/2015 >60 >60 mL/min Final    Comment:    (NOTE) The eGFR has been calculated using the CKD EPI equation. This calculation has not been validated in all clinical situations. eGFR's persistently <60 mL/min signify possible Chronic Kidney Disease.    GFR, Estimated  Date Value Ref Range Status  06/20/2020 >60 >60 mL/min Final    Comment:    (NOTE) Calculated using the CKD-EPI Creatinine Equation (2021)    eGFR  Date Value Ref Range Status  03/23/2023 103 >59 mL/min/1.73 Final         Passed - Patient is not pregnant      Passed - Valid encounter within last 12 months    Recent Outpatient Visits           1 week ago Vaginal dryness, menopausal   [place] [facility] Grayce Sessions, NP   1 month ago Prediabetes   [place] [facility] Grayce Sessions, NP   5 months ago Controlled type 2 diabetes mellitus with complication, without long-term current  use of insulin (HCC)   [place] [facility] [place] Drucilla Chalet, RPH-CPP   6 months ago Prediabetes   [place] [facility] Grayce Sessions, NP   8 months ago Vaginal discharge    [facility] Grayce Sessions, NP       Future Appointments             In 9 months Christia Reading Lindalou Hose, NP [facility]

## 2023-07-17 ENCOUNTER — Other Ambulatory Visit: Payer: Self-pay

## 2023-07-17 NOTE — Telephone Encounter (Signed)
 Requested medication (s) are due for refill today: na  Requested medication (s) are on the active medication list: yes  Last refill:  06/22/23 #30 0 refills  Future visit scheduled: no   Notes to clinic:  last ordered by Marcelline Mates, PA 06/22/23. Do you want to order Rx?     Requested Prescriptions  Pending Prescriptions Disp Refills   omeprazole (PRILOSEC) 20 MG capsule 30 capsule 0    Sig: Take 1 capsule (20 mg total) by mouth daily.     Gastroenterology: Proton Pump Inhibitors Passed - 07/17/2023 10:51 AM      Passed - Valid encounter within last 12 months    Recent Outpatient Visits           1 week ago Vaginal dryness, menopausal   [place] [facility] Grayce Sessions, NP   1 month ago Prediabetes   [place] [facility] Grayce Sessions, NP   5 months ago Controlled type 2 diabetes mellitus with complication, without long-term current use of insulin (HCC)   [place] [facility] [place] Drucilla Chalet, RPH-CPP   6 months ago Prediabetes   [place] [facility] Grayce Sessions, NP   8 months ago Vaginal discharge   [place] [facility] Grayce Sessions, NP       Future Appointments             In 9 months Christia Reading, Lindalou Hose, NP [facility]            rf

## 2023-07-21 ENCOUNTER — Other Ambulatory Visit: Payer: Self-pay

## 2023-07-22 ENCOUNTER — Other Ambulatory Visit: Payer: Self-pay

## 2023-07-22 DIAGNOSIS — M545 Low back pain, unspecified: Secondary | ICD-10-CM | POA: Diagnosis not present

## 2023-07-22 DIAGNOSIS — Z79899 Other long term (current) drug therapy: Secondary | ICD-10-CM | POA: Diagnosis not present

## 2023-07-22 DIAGNOSIS — M25561 Pain in right knee: Secondary | ICD-10-CM | POA: Diagnosis not present

## 2023-07-22 DIAGNOSIS — E1169 Type 2 diabetes mellitus with other specified complication: Secondary | ICD-10-CM | POA: Diagnosis not present

## 2023-07-22 DIAGNOSIS — M25562 Pain in left knee: Secondary | ICD-10-CM | POA: Diagnosis not present

## 2023-07-22 DIAGNOSIS — M5030 Other cervical disc degeneration, unspecified cervical region: Secondary | ICD-10-CM | POA: Diagnosis not present

## 2023-07-22 MED ORDER — OXYCODONE-ACETAMINOPHEN 10-325 MG PO TABS
1.0000 | ORAL_TABLET | Freq: Four times a day (QID) | ORAL | 0 refills | Status: AC | PRN
Start: 2023-07-22 — End: ?
  Filled 2023-07-22: qty 120, 30d supply, fill #0
  Filled 2023-07-24: qty 180, 30d supply, fill #0

## 2023-07-22 MED ORDER — LACTULOSE 10 GM/15ML PO SOLN
ORAL | 0 refills | Status: AC
  Filled 2023-07-22: qty 1850, 30d supply, fill #0
  Filled 2023-07-22: qty 1892, 31d supply, fill #0
  Filled 2023-08-19: qty 1892, 31d supply, fill #1
  Filled 2023-09-23 – 2023-12-11 (×7): qty 1892, 31d supply, fill #2

## 2023-07-22 MED ORDER — PHENTERMINE HCL 30 MG PO CAPS
30.0000 mg | ORAL_CAPSULE | Freq: Every day | ORAL | 0 refills | Status: DC
Start: 2023-07-22 — End: 2023-08-21
  Filled 2023-07-22 – 2023-07-31 (×3): qty 30, 30d supply, fill #0

## 2023-07-22 MED ORDER — LINZESS 290 MCG PO CAPS
290.0000 ug | ORAL_CAPSULE | Freq: Every day | ORAL | 0 refills | Status: DC
  Filled 2023-07-22 – 2023-07-31 (×2): qty 30, 30d supply, fill #0

## 2023-07-23 ENCOUNTER — Other Ambulatory Visit: Payer: Self-pay

## 2023-07-23 ENCOUNTER — Other Ambulatory Visit (INDEPENDENT_AMBULATORY_CARE_PROVIDER_SITE_OTHER): Payer: Self-pay | Admitting: Primary Care

## 2023-07-23 MED ORDER — CLOTRIMAZOLE-BETAMETHASONE 1-0.05 % EX CREA
1.0000 | TOPICAL_CREAM | Freq: Every day | CUTANEOUS | 0 refills | Status: DC
Start: 2023-07-23 — End: 2023-09-06
  Filled 2023-07-23: qty 30, 30d supply, fill #0

## 2023-07-23 MED ORDER — PREGABALIN 50 MG PO CAPS
50.0000 mg | ORAL_CAPSULE | Freq: Two times a day (BID) | ORAL | 1 refills | Status: DC
  Filled 2023-07-23 – 2023-10-19 (×2): qty 180, 90d supply, fill #0
  Filled 2024-01-12: qty 180, 90d supply, fill #1

## 2023-07-24 ENCOUNTER — Other Ambulatory Visit: Payer: Self-pay

## 2023-07-27 DIAGNOSIS — Z79899 Other long term (current) drug therapy: Secondary | ICD-10-CM | POA: Diagnosis not present

## 2023-07-28 DIAGNOSIS — Z780 Asymptomatic menopausal state: Secondary | ICD-10-CM | POA: Diagnosis not present

## 2023-07-31 ENCOUNTER — Other Ambulatory Visit: Payer: Self-pay

## 2023-08-05 ENCOUNTER — Other Ambulatory Visit: Payer: Self-pay

## 2023-08-06 ENCOUNTER — Encounter (INDEPENDENT_AMBULATORY_CARE_PROVIDER_SITE_OTHER): Payer: Self-pay | Admitting: Primary Care

## 2023-08-06 ENCOUNTER — Other Ambulatory Visit (INDEPENDENT_AMBULATORY_CARE_PROVIDER_SITE_OTHER): Payer: Self-pay

## 2023-08-06 ENCOUNTER — Other Ambulatory Visit: Payer: Self-pay | Admitting: Pharmacist

## 2023-08-06 ENCOUNTER — Other Ambulatory Visit: Payer: Self-pay

## 2023-08-06 DIAGNOSIS — J4520 Mild intermittent asthma, uncomplicated: Secondary | ICD-10-CM

## 2023-08-06 MED ORDER — DICLOFENAC SODIUM 1 % EX GEL
2.0000 g | Freq: Every day | CUTANEOUS | 1 refills | Status: DC | PRN
  Filled 2023-08-06: qty 100, 50d supply, fill #0

## 2023-08-06 MED ORDER — ALBUTEROL SULFATE HFA 108 (90 BASE) MCG/ACT IN AERS
2.0000 | INHALATION_SPRAY | Freq: Four times a day (QID) | RESPIRATORY_TRACT | 2 refills | Status: DC | PRN
  Filled 2023-08-06: qty 18, 25d supply, fill #0
  Filled 2023-11-16: qty 18, 25d supply, fill #1
  Filled 2024-03-10: qty 18, 25d supply, fill #2

## 2023-08-06 MED ORDER — FLUTICASONE-SALMETEROL 250-50 MCG/ACT IN AEPB
1.0000 | INHALATION_SPRAY | Freq: Two times a day (BID) | RESPIRATORY_TRACT | 3 refills | Status: DC
  Filled 2023-08-06: qty 60, 30d supply, fill #0
  Filled 2023-09-06: qty 60, 30d supply, fill #1
  Filled 2023-10-16: qty 60, 30d supply, fill #2
  Filled 2023-11-16: qty 60, 30d supply, fill #3

## 2023-08-06 MED ORDER — FLUTICASONE PROPIONATE 50 MCG/ACT NA SUSP
1.0000 | Freq: Every morning | NASAL | 3 refills | Status: DC
  Filled 2023-08-06: qty 16, 60d supply, fill #0
  Filled 2023-10-16: qty 16, 60d supply, fill #1
  Filled 2023-12-13: qty 16, 60d supply, fill #2
  Filled 2024-02-11: qty 16, 60d supply, fill #3

## 2023-08-06 NOTE — Telephone Encounter (Signed)
 Pt is requesting a refill on Symbicort  but when I tried to order it but it came up with "budesonide -formoterol  (SYMBICORT ) 160-4.5 MCG/ACT inhaler is not on the preferred formulary for the patient's insurance plan " Would you be able to take a look at this for me. Pt uses the [employer]

## 2023-08-07 ENCOUNTER — Other Ambulatory Visit: Payer: Self-pay

## 2023-08-18 ENCOUNTER — Other Ambulatory Visit: Payer: Self-pay

## 2023-08-18 ENCOUNTER — Other Ambulatory Visit (INDEPENDENT_AMBULATORY_CARE_PROVIDER_SITE_OTHER): Payer: Self-pay

## 2023-08-18 DIAGNOSIS — R194 Change in bowel habit: Secondary | ICD-10-CM | POA: Diagnosis not present

## 2023-08-18 DIAGNOSIS — T402X5A Adverse effect of other opioids, initial encounter: Secondary | ICD-10-CM | POA: Diagnosis not present

## 2023-08-18 DIAGNOSIS — K5903 Drug induced constipation: Secondary | ICD-10-CM | POA: Diagnosis not present

## 2023-08-18 MED ORDER — NA SULFATE-K SULFATE-MG SULF 17.5-3.13-1.6 GM/177ML PO SOLN
354.0000 mL | Freq: Once | ORAL | 0 refills | Status: AC
  Filled 2023-08-18: qty 1, 2d supply, fill #0

## 2023-08-19 ENCOUNTER — Other Ambulatory Visit: Payer: Self-pay

## 2023-08-21 ENCOUNTER — Other Ambulatory Visit: Payer: Self-pay

## 2023-08-21 DIAGNOSIS — M5030 Other cervical disc degeneration, unspecified cervical region: Secondary | ICD-10-CM | POA: Diagnosis not present

## 2023-08-21 DIAGNOSIS — E1169 Type 2 diabetes mellitus with other specified complication: Secondary | ICD-10-CM | POA: Diagnosis not present

## 2023-08-21 DIAGNOSIS — M25562 Pain in left knee: Secondary | ICD-10-CM | POA: Diagnosis not present

## 2023-08-21 DIAGNOSIS — M25561 Pain in right knee: Secondary | ICD-10-CM | POA: Diagnosis not present

## 2023-08-21 DIAGNOSIS — M545 Low back pain, unspecified: Secondary | ICD-10-CM | POA: Diagnosis not present

## 2023-08-21 DIAGNOSIS — Z79899 Other long term (current) drug therapy: Secondary | ICD-10-CM | POA: Diagnosis not present

## 2023-08-21 MED ORDER — OXYCODONE-ACETAMINOPHEN 10-325 MG PO TABS
1.0000 | ORAL_TABLET | ORAL | 0 refills | Status: DC
  Filled 2023-08-21: qty 180, 30d supply, fill #0

## 2023-08-21 MED ORDER — PHENTERMINE HCL 30 MG PO CAPS
30.0000 mg | ORAL_CAPSULE | Freq: Every day | ORAL | 0 refills | Status: AC
  Filled 2023-09-06: qty 30, 30d supply, fill #0

## 2023-08-21 MED ORDER — NYSTATIN 100000 UNIT/GM EX POWD
1.0000 | Freq: Two times a day (BID) | CUTANEOUS | 2 refills | Status: DC
Start: 2023-08-21 — End: 2023-11-05
  Filled 2023-08-21: qty 60, 30d supply, fill #0
  Filled 2023-09-21: qty 60, 30d supply, fill #1
  Filled 2023-10-19: qty 60, 30d supply, fill #2

## 2023-08-25 ENCOUNTER — Other Ambulatory Visit: Payer: Self-pay

## 2023-08-25 DIAGNOSIS — Z79899 Other long term (current) drug therapy: Secondary | ICD-10-CM | POA: Diagnosis not present

## 2023-08-25 MED ORDER — TRULANCE 3 MG PO TABS
3.0000 mg | ORAL_TABLET | Freq: Every day | ORAL | 1 refills | Status: DC
  Filled 2023-08-25: qty 90, 90d supply, fill #0

## 2023-08-26 ENCOUNTER — Other Ambulatory Visit: Payer: Self-pay

## 2023-08-28 ENCOUNTER — Other Ambulatory Visit: Payer: Self-pay

## 2023-09-02 DIAGNOSIS — K635 Polyp of colon: Secondary | ICD-10-CM | POA: Diagnosis not present

## 2023-09-04 ENCOUNTER — Telehealth (INDEPENDENT_AMBULATORY_CARE_PROVIDER_SITE_OTHER): Payer: Self-pay | Admitting: Primary Care

## 2023-09-04 NOTE — Telephone Encounter (Signed)
 Spoke to pt about upcoming appt.. Will be present

## 2023-09-06 ENCOUNTER — Other Ambulatory Visit (INDEPENDENT_AMBULATORY_CARE_PROVIDER_SITE_OTHER): Payer: Self-pay | Admitting: Primary Care

## 2023-09-07 ENCOUNTER — Other Ambulatory Visit: Payer: Self-pay

## 2023-09-07 ENCOUNTER — Encounter (INDEPENDENT_AMBULATORY_CARE_PROVIDER_SITE_OTHER): Payer: Self-pay | Admitting: Primary Care

## 2023-09-07 ENCOUNTER — Ambulatory Visit (INDEPENDENT_AMBULATORY_CARE_PROVIDER_SITE_OTHER): Admitting: Primary Care

## 2023-09-07 VITALS — BP 130/84 | HR 65 | Wt 190.0 lb

## 2023-09-07 DIAGNOSIS — M2550 Pain in unspecified joint: Secondary | ICD-10-CM | POA: Diagnosis not present

## 2023-09-07 DIAGNOSIS — F5102 Adjustment insomnia: Secondary | ICD-10-CM | POA: Diagnosis not present

## 2023-09-07 MED ORDER — DICLOFENAC SODIUM 1 % EX GEL
4.0000 g | Freq: Four times a day (QID) | CUTANEOUS | 1 refills | Status: AC
  Filled 2023-09-07: qty 350, fill #0
  Filled 2023-09-07: qty 200, 13d supply, fill #0

## 2023-09-07 MED ORDER — QUETIAPINE FUMARATE 50 MG PO TABS
50.0000 mg | ORAL_TABLET | Freq: Every day | ORAL | 1 refills | Status: AC
  Filled 2023-09-07: qty 90, 90d supply, fill #0
  Filled 2023-11-21: qty 30, 30d supply, fill #0
  Filled 2024-01-05: qty 30, 30d supply, fill #1
  Filled 2024-02-11: qty 30, 30d supply, fill #2
  Filled 2024-04-05: qty 30, 30d supply, fill #3

## 2023-09-07 MED ORDER — CLOTRIMAZOLE-BETAMETHASONE 1-0.05 % EX CREA
1.0000 | TOPICAL_CREAM | Freq: Every day | CUTANEOUS | 0 refills | Status: DC
  Filled 2023-09-07: qty 30, 30d supply, fill #0

## 2023-09-07 NOTE — Progress Notes (Signed)
 [facility]  Angelica Ortiz, is a 58 y.o. female  [id number]  [id number]  DOB - 05-Dec-1965  Chief Complaint  Patient presents with   Medical Management of Chronic Issues       Subjective:   Angelica Ortiz is a 58 y.o. female here today for a follow up visit.  The management of insomnia and chronic arthritis pain.  She is also on Mounjaro  for weight loss.  Patient has No headache, No chest pain, No abdominal pain - No Nausea, No new weakness tingling or numbness, No Cough - shortness of breath No problems updated.  Comprehensive ROS Pertinent positive and negative noted in HPI   Allergies  Allergen Reactions   Dilaudid  [Hydromorphone  Hcl] Hives and Itching   Iodinated Contrast Media Hives and Itching    Pt given 4 hour prep 06/21/20, c/o itching after injection- [facility]. 03/27/23: reacted to 13 HOUR PREP, c/o itching after receiving contrast.    Levaquin [Levofloxacin] Anaphylaxis   Tangerine Flavoring Agent (Non-Screening) Swelling    Tangerine fruit: Lip and tongue swelling with tangerines   Ace Inhibitors Swelling   Iodine-131 Itching and Rash    Past Medical History:  Diagnosis Date   Anemia    Arthritis    Asthma    Depression    Diabetes mellitus without complication (HCC)    Type 2   Diverticulitis    Falls    Fibromyalgia    Gait instability    GERD (gastroesophageal reflux disease)    History of urinary tract infection    Hypertension    Lupus    no meds   Migraine    Morbid obesity 03/30/2013   Overactive bladder    Overactive bladder    Pulmonary embolism (HCC)    Resolved -At age 36 broken right leg, blood clot broke off - into lung   Seasonal allergies    Sleep apnea 06/05/2013   Does not have CPAP machine   Urinary incontinence     Current Outpatient Medications on File Prior to Visit  Medication Sig Dispense Refill   albuterol  (VENTOLIN  HFA) 108 (90 Base) MCG/ACT inhaler Inhale 2 puffs into the lungs every 6 (six) hours as needed  for shortness of breath or wheezing. 18 g 2   BIOTIN PO Take 1 tablet by mouth daily.     brimonidine (ALPHAGAN) 0.2 % ophthalmic solution 3 (three) times daily.     CALCIUM  PO Take 1 tablet by mouth daily.     conjugated estrogens  (PREMARIN ) vaginal cream Place 1 Applicatorful vaginally daily. 42.5 g 12   ELDERBERRY PO Take 1 tablet by mouth daily.     fluticasone  (FLONASE ) 50 MCG/ACT nasal spray Place 1 spray into both nostrils in the morning. 16 g 3   fluticasone -salmeterol (ADVAIR  DISKUS) 250-50 MCG/ACT AEPB Inhale 1 puff into the lungs in the morning and at bedtime. 60 each 3   Fremanezumab -vfrm (AJOVY ) 225 MG/1.5ML SOAJ Inject 225 mg into the skin every 30 (thirty) days. 1.5 mL 11   lactulose  (CHRONULAC ) 10 GM/15ML solution Take 30-60 mL  by mouth daily as needed for constipation 3785 mL 0   lidocaine  (LIDODERM ) 5 % Place 3 patches onto the skin daily. 30 patch 3   linaclotide  (LINZESS ) 290 MCG CAPS capsule Take 1 capsule (290 mcg total) by mouth daily. 30 capsule 0   MAGNESIUM PO Take 1 tablet by mouth daily.     Multiple Vitamin (MULTIVITAMIN WITH MINERALS) TABS tablet Take 1 tablet by mouth 2 (two)  times daily.     naloxone  (NARCAN ) nasal spray 4 mg/0.1 mL Use 1 spray every 3 minutes; spray 1 dose into ONE nostril; alternate nostrils w each dose until help arrives 2 each 1   NURTEC 75 MG TBDP Take 1 tablet (75 mg total) by mouth daily as needed (migraine). 48 tablet 4   nystatin  (MYCOSTATIN /NYSTOP ) powder Apply 1 application to (affected) skin 2 times per day 60 g 2   nystatin  (MYCOSTATIN /NYSTOP ) powder Apply 1 Application TO AFFECTED SKIN topically 2 (two) times daily. 60 g 2   omeprazole  (PRILOSEC) 20 MG capsule Take 1 capsule (20 mg total) by mouth daily. 30 capsule 0   oxyCODONE -acetaminophen  (PERCOCET) 10-325 MG tablet Take 1 tablet by mouth 4 to 6 hours as needed for pain. 180 tablet 0   oxyCODONE -acetaminophen  (PERCOCET) 10-325 MG tablet Take 1 tablet by mouth every four to 6  (six) hours as needed for pain. 180 tablet 0   oxyCODONE -acetaminophen  (PERCOCET) 10-325 MG tablet Take 1 tablet by mouth every 4 to 6 hours as needed for pain. 180 tablet 0   phentermine  30 MG capsule Take 1 capsule (30 mg total) by mouth daily. 30 capsule 0   phentermine  37.5 MG capsule Take 1 capsule (37.5 mg total) by mouth daily. 30 capsule 0   Plecanatide  (TRULANCE ) 3 MG TABS Take 1 tablet (3 mg total) by mouth daily. 90 tablet 1   pregabalin  (LYRICA ) 50 MG capsule Take 1 capsule (50 mg total) by mouth 2 (two) times daily. 180 capsule 1   rosuvastatin  (CRESTOR ) 5 MG tablet Take 1 tablet (5 mg total) by mouth daily. 90 tablet 2   tirzepatide  (MOUNJARO ) 15 MG/0.5ML Pen Inject 15 mg into the skin once a week. 6 mL 4   vitamin B-12 (CYANOCOBALAMIN) 100 MCG tablet Take 100 mcg by mouth daily.     clotrimazole -betamethasone  (LOTRISONE ) cream Apply 1 Application topically daily. 30 g 0   No current facility-administered medications on file prior to visit.   Health Maintenance  Topic Date Due   DTaP/Tdap/Td vaccine (1 - Tdap) Never done   Zoster (Shingles) Vaccine (1 of 2) 06/11/1986   Pap with HPV screening  06/06/2016   Pneumococcal Vaccination (2 of 2 - PCV) 12/06/2021   Medicare Annual Wellness Visit  11/30/2022   COVID-19 Vaccine (5 - 2024-25 season) 12/07/2022   Complete foot exam   05/02/2023   Hemoglobin A1C  07/16/2023   Mammogram  08/30/2023   Eye exam for diabetics  10/16/2023   Flu Shot  11/06/2023   Yearly kidney health urinalysis for diabetes  01/15/2024   Yearly kidney function blood test for diabetes  03/22/2024   Colon Cancer Screening  07/05/2024   Hepatitis C Screening  Completed   HIV Screening  Completed   HPV Vaccine  Aged Out   Meningitis B Vaccine  Aged Out    Objective:   Vitals:   09/07/23 0955  BP: 130/84  Pulse: 65  SpO2: 100%  Weight: 190 lb (86.2 kg)   BP Readings from Last 3 Encounters:  09/07/23 130/84  07/08/23 131/87  06/02/23 102/80       Physical Exam Vitals reviewed.  Constitutional:      Appearance: Normal appearance. She is obese.  HENT:     Head: Normocephalic.     Right Ear: Tympanic membrane, ear canal and external ear normal.     Left Ear: Tympanic membrane, ear canal and external ear normal.     Nose: Nose normal.  Mouth/Throat:     Mouth: Mucous membranes are moist.   Eyes:     Extraocular Movements: Extraocular movements intact.     Pupils: Pupils are equal, round, and reactive to light.    Cardiovascular:     Rate and Rhythm: Normal rate.  Pulmonary:     Effort: Pulmonary effort is normal.     Breath sounds: Normal breath sounds.  Abdominal:     General: Bowel sounds are normal.     Palpations: Abdomen is soft.   Musculoskeletal:     Cervical back: Normal range of motion.     Comments: Bilateral wrist pain that impedes her from lifting objects even utensils   Skin:    General: Skin is warm and dry.   Neurological:     Mental Status: She is alert and oriented to person, place, and time.   Psychiatric:        Mood and Affect: Mood normal.        Behavior: Behavior normal.        Thought Content: Thought content normal.      Assessment & Plan  Reena was seen today for medical management of chronic issues.  Diagnoses and all orders for this visit:  Arthralgia, unspecified joint Refill Voltaren  gel also followed by neurology and wears braces on hands for wrist  Adjustment insomnia -     QUEtiapine  (SEROQUEL ) 50 MG tablet; Take 1 tablet (50 mg total) by mouth at bedtime.  Other orders -     diclofenac  Sodium (VOLTAREN ) 1 % GEL; Apply 4 g topically 4 (four) times daily.      Patient have been counseled extensively about nutrition and exercise. Other issues discussed during this visit include: low cholesterol diet, weight control and daily exercise, foot care, annual eye examinations at Ophthalmology, importance of adherence with medications and regular follow-up. We also discussed  long term complications of uncontrolled diabetes and hypertension.   Return in about 4 weeks (around 10/05/2023), or Weight and effectiveness of Seroquel .  The patient was given clear instructions to go to ER or return to medical center if symptoms don't improve, worsen or new problems develop. The patient verbalized understanding. The patient was told to call to get lab results if they haven't heard anything in the next week.   This note has been created with Education officer, environmental. Any transcriptional errors are unintentional.   Marius Siemens, NP 09/20/2023, 10:30 PM

## 2023-09-14 ENCOUNTER — Other Ambulatory Visit: Payer: Self-pay

## 2023-09-20 ENCOUNTER — Encounter (INDEPENDENT_AMBULATORY_CARE_PROVIDER_SITE_OTHER): Payer: Self-pay | Admitting: Primary Care

## 2023-09-21 ENCOUNTER — Other Ambulatory Visit (INDEPENDENT_AMBULATORY_CARE_PROVIDER_SITE_OTHER): Payer: Self-pay | Admitting: Primary Care

## 2023-09-21 ENCOUNTER — Other Ambulatory Visit: Payer: Self-pay

## 2023-09-21 DIAGNOSIS — E1169 Type 2 diabetes mellitus with other specified complication: Secondary | ICD-10-CM | POA: Diagnosis not present

## 2023-09-21 DIAGNOSIS — M25561 Pain in right knee: Secondary | ICD-10-CM | POA: Diagnosis not present

## 2023-09-21 DIAGNOSIS — M85851 Other specified disorders of bone density and structure, right thigh: Secondary | ICD-10-CM | POA: Diagnosis not present

## 2023-09-21 DIAGNOSIS — K5909 Other constipation: Secondary | ICD-10-CM | POA: Diagnosis not present

## 2023-09-21 DIAGNOSIS — Z79899 Other long term (current) drug therapy: Secondary | ICD-10-CM | POA: Diagnosis not present

## 2023-09-21 DIAGNOSIS — M25562 Pain in left knee: Secondary | ICD-10-CM | POA: Diagnosis not present

## 2023-09-21 DIAGNOSIS — E559 Vitamin D deficiency, unspecified: Secondary | ICD-10-CM | POA: Diagnosis not present

## 2023-09-21 DIAGNOSIS — M5030 Other cervical disc degeneration, unspecified cervical region: Secondary | ICD-10-CM | POA: Diagnosis not present

## 2023-09-21 DIAGNOSIS — J45909 Unspecified asthma, uncomplicated: Secondary | ICD-10-CM | POA: Diagnosis not present

## 2023-09-21 MED ORDER — OXYCODONE-ACETAMINOPHEN 10-325 MG PO TABS
1.0000 | ORAL_TABLET | ORAL | 0 refills | Status: AC
  Filled 2023-09-21: qty 180, 30d supply, fill #0

## 2023-09-21 MED ORDER — LACTULOSE ENCEPHALOPATHY 10 GM/15ML PO SOLN
20.0000 g | Freq: Every day | ORAL | 0 refills | Status: DC | PRN
  Filled 2023-09-21: qty 1892, 31d supply, fill #0
  Filled 2023-10-19: qty 1892, 31d supply, fill #1

## 2023-09-21 MED ORDER — LIDOCAINE 5 % EX PTCH
3.0000 | MEDICATED_PATCH | Freq: Every day | CUTANEOUS | 3 refills | Status: DC
  Filled 2023-09-21: qty 30, 10d supply, fill #0
  Filled 2023-10-16: qty 30, 10d supply, fill #1
  Filled 2023-10-26: qty 30, 10d supply, fill #2
  Filled 2023-11-04: qty 30, 10d supply, fill #3

## 2023-09-21 MED ORDER — PHENTERMINE HCL 37.5 MG PO TABS
37.5000 mg | ORAL_TABLET | Freq: Every day | ORAL | 0 refills | Status: DC
  Filled 2023-09-21: qty 30, 30d supply, fill #0

## 2023-09-22 ENCOUNTER — Other Ambulatory Visit: Payer: Self-pay

## 2023-09-23 ENCOUNTER — Other Ambulatory Visit: Payer: Self-pay

## 2023-09-23 DIAGNOSIS — Z79899 Other long term (current) drug therapy: Secondary | ICD-10-CM | POA: Diagnosis not present

## 2023-09-30 ENCOUNTER — Other Ambulatory Visit: Payer: Self-pay

## 2023-10-15 DIAGNOSIS — M79641 Pain in right hand: Secondary | ICD-10-CM | POA: Diagnosis not present

## 2023-10-15 DIAGNOSIS — M24541 Contracture, right hand: Secondary | ICD-10-CM | POA: Diagnosis not present

## 2023-10-15 DIAGNOSIS — G90511 Complex regional pain syndrome I of right upper limb: Secondary | ICD-10-CM | POA: Diagnosis not present

## 2023-10-16 ENCOUNTER — Other Ambulatory Visit: Payer: Self-pay

## 2023-10-19 ENCOUNTER — Other Ambulatory Visit: Payer: Self-pay

## 2023-10-19 ENCOUNTER — Other Ambulatory Visit: Payer: Self-pay | Admitting: Family Medicine

## 2023-10-19 ENCOUNTER — Other Ambulatory Visit (INDEPENDENT_AMBULATORY_CARE_PROVIDER_SITE_OTHER): Payer: Self-pay | Admitting: Primary Care

## 2023-10-19 MED ORDER — ROSUVASTATIN CALCIUM 5 MG PO TABS
5.0000 mg | ORAL_TABLET | Freq: Every day | ORAL | 2 refills | Status: AC
  Filled 2023-10-19: qty 90, 90d supply, fill #0
  Filled 2024-01-15 (×2): qty 90, 90d supply, fill #1
  Filled 2024-04-09: qty 90, 90d supply, fill #2

## 2023-10-20 NOTE — Telephone Encounter (Signed)
 Requested medication (s) are due for refill today -provider review   Requested medication (s) are on the active medication list -yes  Future visit scheduled -no  Last refill: 09/07/23 30g  Notes to clinic: off protocol- provider review   Requested Prescriptions  Pending Prescriptions Disp Refills   clotrimazole -betamethasone  (LOTRISONE ) cream 30 g 0    Sig: Apply 1 Application topically daily.     Off-Protocol Failed - 10/20/2023  4:07 PM      Failed - Medication not assigned to a protocol, review manually.      Passed - Valid encounter within last 12 months    Recent Outpatient Visits           1 month ago Arthralgia, unspecified joint   [place] [facility] Celestia Rosaline SQUIBB, NP   3 months ago Vaginal dryness, menopausal   [place] [facility] Celestia Rosaline SQUIBB, NP   4 months ago Prediabetes   [place] [facility] Celestia Rosaline SQUIBB, NP   8 months ago Controlled type 2 diabetes mellitus with complication, without long-term current use of insulin  [facility])   [place] [facility] [place] Fleeta Tonia Garnette LITTIE, RPH-CPP   9 months ago Prediabetes   [place] [facility] Celestia Rosaline SQUIBB, NP       Future Appointments             In 6 months [place] Lauraine PARAS, NP [place] [facility]               Requested Prescriptions  Pending Prescriptions Disp Refills   clotrimazole -betamethasone  (LOTRISONE ) cream 30 g 0    Sig: Apply 1 Application topically daily.     Off-Protocol Failed - 10/20/2023  4:07 PM      Failed - Medication not assigned to a protocol, review manually.      Passed - Valid encounter within last 12 months    Recent Outpatient Visits           1 month ago Arthralgia, unspecified joint   [place] [facility] Celestia Rosaline SQUIBB, NP   3 months ago Vaginal dryness, menopausal   [place]  [facility] Celestia Rosaline SQUIBB, NP   4 months ago Prediabetes   [place] [facility] Celestia Rosaline SQUIBB, NP   8 months ago Controlled type 2 diabetes mellitus with complication, without long-term current use of insulin  [facility])   [place] [facility] [place] Fleeta Tonia Garnette LITTIE, RPH-CPP   9 months ago Prediabetes    [facility] Celestia Rosaline SQUIBB, NP       Future Appointments             In 6 months [place], Lauraine PARAS, NP [facility]

## 2023-10-21 ENCOUNTER — Other Ambulatory Visit (HOSPITAL_COMMUNITY): Payer: Self-pay

## 2023-10-21 ENCOUNTER — Other Ambulatory Visit: Payer: Self-pay

## 2023-10-21 DIAGNOSIS — G90511 Complex regional pain syndrome I of right upper limb: Secondary | ICD-10-CM | POA: Diagnosis not present

## 2023-10-21 DIAGNOSIS — K5909 Other constipation: Secondary | ICD-10-CM | POA: Diagnosis not present

## 2023-10-21 DIAGNOSIS — M25561 Pain in right knee: Secondary | ICD-10-CM | POA: Diagnosis not present

## 2023-10-21 DIAGNOSIS — Z79899 Other long term (current) drug therapy: Secondary | ICD-10-CM | POA: Diagnosis not present

## 2023-10-21 DIAGNOSIS — M25562 Pain in left knee: Secondary | ICD-10-CM | POA: Diagnosis not present

## 2023-10-21 DIAGNOSIS — M5030 Other cervical disc degeneration, unspecified cervical region: Secondary | ICD-10-CM | POA: Diagnosis not present

## 2023-10-21 DIAGNOSIS — M85851 Other specified disorders of bone density and structure, right thigh: Secondary | ICD-10-CM | POA: Diagnosis not present

## 2023-10-21 DIAGNOSIS — E559 Vitamin D deficiency, unspecified: Secondary | ICD-10-CM | POA: Diagnosis not present

## 2023-10-21 DIAGNOSIS — G8929 Other chronic pain: Secondary | ICD-10-CM | POA: Diagnosis not present

## 2023-10-21 DIAGNOSIS — E1169 Type 2 diabetes mellitus with other specified complication: Secondary | ICD-10-CM | POA: Diagnosis not present

## 2023-10-21 MED ORDER — PHENTERMINE HCL 37.5 MG PO TABS
37.5000 mg | ORAL_TABLET | Freq: Every day | ORAL | 1 refills | Status: DC
  Filled 2023-10-21: qty 30, 30d supply, fill #0
  Filled 2023-11-19: qty 30, 30d supply, fill #1

## 2023-10-21 MED ORDER — OXYCODONE-ACETAMINOPHEN 10-325 MG PO TABS
1.0000 | ORAL_TABLET | Freq: Four times a day (QID) | ORAL | 0 refills | Status: DC | PRN
  Filled 2023-10-21: qty 180, 30d supply, fill #0
  Filled 2023-10-21: qty 60, 15d supply, fill #1

## 2023-10-21 MED ORDER — LACTULOSE ENCEPHALOPATHY 10 GM/15ML PO SOLN
20.0000 g | Freq: Every day | ORAL | 0 refills | Status: AC | PRN
  Filled 2023-11-16: qty 1892, 31d supply, fill #0

## 2023-10-21 MED ORDER — CLOTRIMAZOLE-BETAMETHASONE 1-0.05 % EX CREA
1.0000 | TOPICAL_CREAM | Freq: Every day | CUTANEOUS | 0 refills | Status: DC
  Filled 2023-10-21: qty 30, 30d supply, fill #0

## 2023-10-22 ENCOUNTER — Other Ambulatory Visit: Payer: Self-pay

## 2023-10-23 DIAGNOSIS — Z79899 Other long term (current) drug therapy: Secondary | ICD-10-CM | POA: Diagnosis not present

## 2023-10-26 ENCOUNTER — Other Ambulatory Visit: Payer: Self-pay

## 2023-10-27 DIAGNOSIS — Z1329 Encounter for screening for other suspected endocrine disorder: Secondary | ICD-10-CM | POA: Diagnosis not present

## 2023-10-29 DIAGNOSIS — Z86018 Personal history of other benign neoplasm: Secondary | ICD-10-CM | POA: Diagnosis not present

## 2023-10-29 DIAGNOSIS — R5383 Other fatigue: Secondary | ICD-10-CM | POA: Diagnosis not present

## 2023-11-04 ENCOUNTER — Other Ambulatory Visit: Payer: Self-pay | Admitting: Medical Genetics

## 2023-11-04 ENCOUNTER — Other Ambulatory Visit (INDEPENDENT_AMBULATORY_CARE_PROVIDER_SITE_OTHER): Payer: Self-pay | Admitting: Primary Care

## 2023-11-04 ENCOUNTER — Encounter (INDEPENDENT_AMBULATORY_CARE_PROVIDER_SITE_OTHER): Payer: Self-pay | Admitting: Primary Care

## 2023-11-04 ENCOUNTER — Other Ambulatory Visit: Payer: Self-pay

## 2023-11-04 NOTE — Telephone Encounter (Signed)
 Copied from CRM [id number]. Topic: Clinical - Medical Advice >> Nov 04, 2023 10:55 AM Montie POUR wrote: Reason for CRM:  peripheral artery disease (PAD) test is abnormal  Right leg is 0.83 - Mild  Left leg is 0.91 - Moderate  House calls with [employer] NP Hilda's number is [phone]

## 2023-11-04 NOTE — Telephone Encounter (Signed)
 Will forward to provider

## 2023-11-05 ENCOUNTER — Other Ambulatory Visit: Payer: Self-pay

## 2023-11-05 MED ORDER — AZITHROMYCIN 250 MG PO TABS
ORAL_TABLET | ORAL | 0 refills | Status: AC
  Filled 2023-11-05: qty 6, 5d supply, fill #0

## 2023-11-05 MED ORDER — CLOTRIMAZOLE-BETAMETHASONE 1-0.05 % EX CREA
1.0000 | TOPICAL_CREAM | Freq: Every day | CUTANEOUS | 0 refills | Status: AC
  Filled 2023-11-05 – 2023-11-16 (×2): qty 30, 30d supply, fill #0

## 2023-11-05 NOTE — Telephone Encounter (Signed)
 Requested medications are due for refill today.  unsure  Requested medications are on the active medications list.  yes  Last refill. 10/21/2023  30 g 0 rf  Future visit scheduled.   no  Notes to clinic.  Medication not assigned to a protocol.    Requested Prescriptions  Pending Prescriptions Disp Refills   clotrimazole -betamethasone  (LOTRISONE ) cream 30 g 0    Sig: Apply 1 Application topically daily.     Off-Protocol Failed - 11/05/2023 11:09 AM      Failed - Medication not assigned to a protocol, review manually.      Passed - Valid encounter within last 12 months    Recent Outpatient Visits           1 month ago Arthralgia, unspecified joint   [place] [facility] Celestia Rosaline SQUIBB, NP   4 months ago Vaginal dryness, menopausal   [place] [facility] Celestia Rosaline SQUIBB, NP   5 months ago Prediabetes   [place] [facility] Celestia Rosaline SQUIBB, NP   9 months ago Controlled type 2 diabetes mellitus with complication, without long-term current use of insulin  [facility])   [facility] Fleeta Tonia Garnette LITTIE, RPH-CPP   9 months ago Prediabetes   [place] [facility] Celestia Rosaline SQUIBB, NP       Future Appointments             In 5 months Gayland, Lauraine PARAS, NP [facility]

## 2023-11-06 ENCOUNTER — Other Ambulatory Visit: Payer: Self-pay

## 2023-11-06 MED ORDER — IBSRELA 50 MG PO TABS
50.0000 mg | ORAL_TABLET | Freq: Two times a day (BID) | ORAL | 1 refills | Status: AC
  Filled 2023-11-06 – 2023-11-23 (×2): qty 60, 30d supply, fill #0
  Filled 2023-11-23: qty 180, 90d supply, fill #0

## 2023-11-09 ENCOUNTER — Other Ambulatory Visit: Payer: Self-pay

## 2023-11-09 MED ORDER — IBUPROFEN 400 MG PO TABS
400.0000 mg | ORAL_TABLET | ORAL | 0 refills | Status: DC | PRN
  Filled 2023-11-09: qty 30, 5d supply, fill #0

## 2023-11-09 MED ORDER — CHLORHEXIDINE GLUCONATE 0.12 % MT SOLN
Freq: Two times a day (BID) | OROMUCOSAL | 0 refills | Status: DC
  Filled 2023-11-09: qty 473, 15d supply, fill #0

## 2023-11-09 MED ORDER — DEXAMETHASONE 4 MG PO TABS
4.0000 mg | ORAL_TABLET | Freq: Three times a day (TID) | ORAL | 0 refills | Status: DC | PRN
  Filled 2023-11-09: qty 9, 3d supply, fill #0

## 2023-11-10 ENCOUNTER — Other Ambulatory Visit: Payer: Self-pay

## 2023-11-10 DIAGNOSIS — G90511 Complex regional pain syndrome I of right upper limb: Secondary | ICD-10-CM | POA: Diagnosis not present

## 2023-11-10 DIAGNOSIS — M24541 Contracture, right hand: Secondary | ICD-10-CM | POA: Diagnosis not present

## 2023-11-16 ENCOUNTER — Other Ambulatory Visit (HOSPITAL_COMMUNITY)

## 2023-11-16 ENCOUNTER — Other Ambulatory Visit: Payer: Self-pay

## 2023-11-16 ENCOUNTER — Other Ambulatory Visit (INDEPENDENT_AMBULATORY_CARE_PROVIDER_SITE_OTHER): Payer: Self-pay | Admitting: Primary Care

## 2023-11-17 ENCOUNTER — Other Ambulatory Visit: Payer: Self-pay

## 2023-11-17 DIAGNOSIS — M25562 Pain in left knee: Secondary | ICD-10-CM | POA: Diagnosis not present

## 2023-11-17 DIAGNOSIS — G8929 Other chronic pain: Secondary | ICD-10-CM | POA: Diagnosis not present

## 2023-11-17 DIAGNOSIS — M25561 Pain in right knee: Secondary | ICD-10-CM | POA: Diagnosis not present

## 2023-11-17 DIAGNOSIS — M5030 Other cervical disc degeneration, unspecified cervical region: Secondary | ICD-10-CM | POA: Diagnosis not present

## 2023-11-17 DIAGNOSIS — Z79899 Other long term (current) drug therapy: Secondary | ICD-10-CM | POA: Diagnosis not present

## 2023-11-17 DIAGNOSIS — M545 Low back pain, unspecified: Secondary | ICD-10-CM | POA: Diagnosis not present

## 2023-11-17 DIAGNOSIS — G90511 Complex regional pain syndrome I of right upper limb: Secondary | ICD-10-CM | POA: Diagnosis not present

## 2023-11-17 MED ORDER — OXYCODONE-ACETAMINOPHEN 10-325 MG PO TABS
1.0000 | ORAL_TABLET | ORAL | 0 refills | Status: AC
  Filled 2023-11-17: qty 120, 30d supply, fill #0
  Filled 2023-11-18: qty 180, 30d supply, fill #0

## 2023-11-18 ENCOUNTER — Other Ambulatory Visit: Payer: Self-pay

## 2023-11-19 ENCOUNTER — Other Ambulatory Visit: Payer: Self-pay

## 2023-11-19 ENCOUNTER — Other Ambulatory Visit (HOSPITAL_COMMUNITY)
Admission: RE | Admit: 2023-11-19 | Discharge: 2023-11-19 | Disposition: A | Discharge status: Home / Self Care | Payer: Self-pay | Source: Ambulatory Visit | Attending: Medical Genetics | Admitting: Medical Genetics

## 2023-11-19 MED ORDER — LIDOCAINE 5 % EX PTCH
3.0000 | MEDICATED_PATCH | Freq: Every day | CUTANEOUS | 3 refills | Status: AC
Start: 2023-11-19 — End: ?
  Filled 2023-11-19: qty 30, 10d supply, fill #0
  Filled 2023-12-11: qty 30, 10d supply, fill #1
  Filled 2024-01-05: qty 30, 10d supply, fill #2
  Filled 2024-01-12 – 2024-01-13 (×4): qty 30, 10d supply, fill #3

## 2023-11-19 NOTE — Telephone Encounter (Signed)
 Requested Prescriptions  Pending Prescriptions Disp Refills   lidocaine (LIDODERM) 5 % 30 patch 3    Sig: Place 3 patches onto the skin daily.Remove after 12 hours.     Analgesics:  Topicals Failed - 11/19/2023 10:20 AM      Failed - Manual Review: Labs are only required if the patient has taken medication for more than 8 weeks.      Passed - PLT in normal range and within 360 days    Platelets  Date Value Ref Range Status  01/15/2023 226 150 - 450 x10E3/uL Final         Passed - HGB in normal range and within 360 days    Hemoglobin  Date Value Ref Range Status  01/15/2023 13.3 11.1 - 15.9 g/dL Final         Passed - HCT in normal range and within 360 days    Hematocrit  Date Value Ref Range Status  01/15/2023 41.3 34.0 - 46.6 % Final         Passed - Cr in normal range and within 360 days    Creatinine, Ser  Date Value Ref Range Status  03/23/2023 0.67 0.57 - 1.00 mg/dL Final         Passed - eGFR is 30 or above and within 360 days    GFR calc Af Amer  Date Value Ref Range Status  03/09/2015 >60 >60 mL/min Final    Comment:    (NOTE) The eGFR has been calculated using the CKD EPI equation. This calculation has not been validated in all clinical situations. eGFR's persistently <60 mL/min signify possible Chronic Kidney Disease.    GFR, Estimated  Date Value Ref Range Status  06/20/2020 >60 >60 mL/min Final    Comment:    (NOTE) Calculated using the CKD-EPI Creatinine Equation (2021)    eGFR  Date Value Ref Range Status  03/23/2023 103 >59 mL/min/1.73 Final         Passed - Patient is not pregnant      Passed - Valid encounter within last 12 months    Recent Outpatient Visits           2 months ago Arthralgia, unspecified joint   [place] [facility] Celestia Rosaline SQUIBB, NP   4 months ago Vaginal dryness, menopausal   [place] [facility] Celestia Rosaline SQUIBB, NP   5 months ago Prediabetes   [place]  [facility] Celestia Rosaline SQUIBB, NP   9 months ago Controlled type 2 diabetes mellitus with complication, without long-term current use of insulin (HCC)   [facility] [place] Fleeta Tonia Garnette LITTIE, RPH-CPP   10 months ago Prediabetes   [place] [facility] Celestia Rosaline SQUIBB, NP       Future Appointments             In 5 months Gayland, Lauraine PARAS, NP [facility]

## 2023-11-20 ENCOUNTER — Other Ambulatory Visit: Payer: Self-pay

## 2023-11-20 DIAGNOSIS — Z79899 Other long term (current) drug therapy: Secondary | ICD-10-CM | POA: Diagnosis not present

## 2023-11-20 DIAGNOSIS — I83893 Varicose veins of bilateral lower extremities with other complications: Secondary | ICD-10-CM | POA: Diagnosis not present

## 2023-11-20 DIAGNOSIS — M79604 Pain in right leg: Secondary | ICD-10-CM | POA: Diagnosis not present

## 2023-11-20 DIAGNOSIS — M79662 Pain in left lower leg: Secondary | ICD-10-CM | POA: Diagnosis not present

## 2023-11-20 DIAGNOSIS — M79661 Pain in right lower leg: Secondary | ICD-10-CM | POA: Diagnosis not present

## 2023-11-20 DIAGNOSIS — I83891 Varicose veins of right lower extremities with other complications: Secondary | ICD-10-CM | POA: Diagnosis not present

## 2023-11-21 ENCOUNTER — Other Ambulatory Visit: Payer: Self-pay

## 2023-11-23 ENCOUNTER — Other Ambulatory Visit: Payer: Self-pay

## 2023-11-24 ENCOUNTER — Other Ambulatory Visit: Payer: Self-pay

## 2023-11-27 DIAGNOSIS — J4540 Moderate persistent asthma, uncomplicated: Secondary | ICD-10-CM | POA: Diagnosis not present

## 2023-11-27 DIAGNOSIS — R0602 Shortness of breath: Secondary | ICD-10-CM | POA: Diagnosis not present

## 2023-11-27 DIAGNOSIS — J301 Allergic rhinitis due to pollen: Secondary | ICD-10-CM | POA: Diagnosis not present

## 2023-11-28 LAB — GENECONNECT MOLECULAR SCREEN: Genetic Analysis Overall Interpretation: NEGATIVE

## 2023-12-01 DIAGNOSIS — J4540 Moderate persistent asthma, uncomplicated: Secondary | ICD-10-CM | POA: Diagnosis not present

## 2023-12-04 ENCOUNTER — Other Ambulatory Visit: Payer: Self-pay

## 2023-12-04 DIAGNOSIS — G479 Sleep disorder, unspecified: Secondary | ICD-10-CM | POA: Diagnosis not present

## 2023-12-04 MED ORDER — PROGESTERONE MICRONIZED 100 MG PO CAPS
100.0000 mg | ORAL_CAPSULE | Freq: Every evening | ORAL | 1 refills | Status: AC
  Filled 2023-12-04: qty 180, 90d supply, fill #0
  Filled 2024-03-04: qty 180, 90d supply, fill #1

## 2023-12-04 MED ORDER — ESTRADIOL 0.1 MG/GM VA CREA
TOPICAL_CREAM | VAGINAL | 3 refills | Status: AC
  Filled 2023-12-04: qty 42.5, 47d supply, fill #0

## 2023-12-09 ENCOUNTER — Other Ambulatory Visit: Payer: Self-pay

## 2023-12-09 DIAGNOSIS — G90511 Complex regional pain syndrome I of right upper limb: Secondary | ICD-10-CM | POA: Diagnosis not present

## 2023-12-11 ENCOUNTER — Other Ambulatory Visit: Payer: Self-pay

## 2023-12-13 ENCOUNTER — Other Ambulatory Visit: Payer: Self-pay | Admitting: Family Medicine

## 2023-12-13 MED ORDER — FLUTICASONE-SALMETEROL 250-50 MCG/ACT IN AEPB
1.0000 | INHALATION_SPRAY | Freq: Two times a day (BID) | RESPIRATORY_TRACT | 3 refills | Status: DC
  Filled 2023-12-13: qty 60, 30d supply, fill #0
  Filled 2024-01-12: qty 60, 30d supply, fill #1
  Filled 2024-02-11: qty 60, 30d supply, fill #2
  Filled 2024-03-10: qty 60, 30d supply, fill #3

## 2023-12-14 ENCOUNTER — Other Ambulatory Visit: Payer: Self-pay

## 2023-12-22 ENCOUNTER — Other Ambulatory Visit: Payer: Self-pay | Admitting: Primary Care

## 2023-12-22 ENCOUNTER — Other Ambulatory Visit: Payer: Self-pay

## 2023-12-22 DIAGNOSIS — Z23 Encounter for immunization: Secondary | ICD-10-CM | POA: Diagnosis not present

## 2023-12-22 DIAGNOSIS — E559 Vitamin D deficiency, unspecified: Secondary | ICD-10-CM | POA: Diagnosis not present

## 2023-12-22 DIAGNOSIS — K5909 Other constipation: Secondary | ICD-10-CM | POA: Diagnosis not present

## 2023-12-22 DIAGNOSIS — Z79899 Other long term (current) drug therapy: Secondary | ICD-10-CM | POA: Diagnosis not present

## 2023-12-22 DIAGNOSIS — E1169 Type 2 diabetes mellitus with other specified complication: Secondary | ICD-10-CM | POA: Diagnosis not present

## 2023-12-22 DIAGNOSIS — M5030 Other cervical disc degeneration, unspecified cervical region: Secondary | ICD-10-CM | POA: Diagnosis not present

## 2023-12-22 DIAGNOSIS — M25562 Pain in left knee: Secondary | ICD-10-CM | POA: Diagnosis not present

## 2023-12-22 DIAGNOSIS — Z1231 Encounter for screening mammogram for malignant neoplasm of breast: Secondary | ICD-10-CM

## 2023-12-22 DIAGNOSIS — M25561 Pain in right knee: Secondary | ICD-10-CM | POA: Diagnosis not present

## 2023-12-22 DIAGNOSIS — M545 Low back pain, unspecified: Secondary | ICD-10-CM | POA: Diagnosis not present

## 2023-12-22 DIAGNOSIS — M85851 Other specified disorders of bone density and structure, right thigh: Secondary | ICD-10-CM | POA: Diagnosis not present

## 2023-12-22 DIAGNOSIS — G90511 Complex regional pain syndrome I of right upper limb: Secondary | ICD-10-CM | POA: Diagnosis not present

## 2023-12-22 MED ORDER — LACTULOSE ENCEPHALOPATHY 10 GM/15ML PO SOLN
20.0000 g | Freq: Every day | ORAL | 0 refills | Status: AC | PRN
  Filled 2023-12-22: qty 1892, 31d supply, fill #0
  Filled 2024-01-06 – 2024-05-01 (×2): qty 1892, 31d supply, fill #1

## 2023-12-22 MED ORDER — PHENTERMINE HCL 37.5 MG PO TABS
37.5000 mg | ORAL_TABLET | Freq: Every day | ORAL | 0 refills | Status: DC
  Filled 2023-12-22: qty 30, 30d supply, fill #0

## 2023-12-22 MED ORDER — OXYCODONE-ACETAMINOPHEN 10-325 MG PO TABS
1.0000 | ORAL_TABLET | Freq: Four times a day (QID) | ORAL | 0 refills | Status: AC | PRN
  Filled 2023-12-22: qty 120, 30d supply, fill #0
  Filled 2023-12-24: qty 60, 15d supply, fill #1
  Filled 2023-12-24: qty 180, 30d supply, fill #0

## 2023-12-24 ENCOUNTER — Other Ambulatory Visit: Payer: Self-pay

## 2023-12-24 ENCOUNTER — Other Ambulatory Visit (HOSPITAL_COMMUNITY): Payer: Self-pay

## 2023-12-24 DIAGNOSIS — I872 Venous insufficiency (chronic) (peripheral): Secondary | ICD-10-CM | POA: Diagnosis not present

## 2023-12-24 DIAGNOSIS — Z79899 Other long term (current) drug therapy: Secondary | ICD-10-CM | POA: Diagnosis not present

## 2023-12-25 ENCOUNTER — Other Ambulatory Visit (HOSPITAL_COMMUNITY): Payer: Self-pay

## 2023-12-25 DIAGNOSIS — G90511 Complex regional pain syndrome I of right upper limb: Secondary | ICD-10-CM | POA: Diagnosis not present

## 2023-12-31 DIAGNOSIS — I83811 Varicose veins of right lower extremities with pain: Secondary | ICD-10-CM | POA: Diagnosis not present

## 2023-12-31 DIAGNOSIS — Z09 Encounter for follow-up examination after completed treatment for conditions other than malignant neoplasm: Secondary | ICD-10-CM | POA: Diagnosis not present

## 2023-12-31 DIAGNOSIS — I872 Venous insufficiency (chronic) (peripheral): Secondary | ICD-10-CM | POA: Diagnosis not present

## 2024-01-05 ENCOUNTER — Other Ambulatory Visit: Payer: Self-pay

## 2024-01-05 MED ORDER — PREDNISONE 50 MG PO TABS
50.0000 mg | ORAL_TABLET | ORAL | 0 refills | Status: DC
Start: 2024-01-05 — End: 2024-01-20
  Filled 2024-01-05: qty 3, 1d supply, fill #0

## 2024-01-06 ENCOUNTER — Other Ambulatory Visit: Payer: Self-pay

## 2024-01-07 ENCOUNTER — Other Ambulatory Visit: Payer: Self-pay

## 2024-01-11 ENCOUNTER — Other Ambulatory Visit: Payer: Self-pay

## 2024-01-11 MED ORDER — CYCLOSPORINE 0.05 % OP EMUL
1.0000 [drp] | Freq: Two times a day (BID) | OPHTHALMIC | 6 refills | Status: AC
  Filled 2024-01-11 (×2): qty 180, 90d supply, fill #0
  Filled 2024-04-09: qty 180, 90d supply, fill #1

## 2024-01-11 MED ORDER — BRIMONIDINE TARTRATE 0.2 % OP SOLN
1.0000 [drp] | Freq: Two times a day (BID) | OPHTHALMIC | 12 refills | Status: AC
  Filled 2024-01-11: qty 15, 75d supply, fill #0
  Filled 2024-03-21: qty 15, 75d supply, fill #1

## 2024-01-12 ENCOUNTER — Other Ambulatory Visit: Payer: Self-pay

## 2024-01-12 ENCOUNTER — Ambulatory Visit
Admission: RE | Admit: 2024-01-12 | Discharge: 2024-01-12 | Disposition: A | Discharge status: Home / Self Care | Source: Ambulatory Visit | Attending: Primary Care | Admitting: Primary Care

## 2024-01-12 DIAGNOSIS — Z1231 Encounter for screening mammogram for malignant neoplasm of breast: Secondary | ICD-10-CM

## 2024-01-13 ENCOUNTER — Other Ambulatory Visit: Payer: Self-pay

## 2024-01-15 ENCOUNTER — Other Ambulatory Visit: Payer: Self-pay

## 2024-01-19 ENCOUNTER — Other Ambulatory Visit (HOSPITAL_BASED_OUTPATIENT_CLINIC_OR_DEPARTMENT_OTHER): Payer: Self-pay

## 2024-01-19 ENCOUNTER — Other Ambulatory Visit (HOSPITAL_COMMUNITY): Payer: Self-pay

## 2024-01-19 ENCOUNTER — Other Ambulatory Visit: Payer: Self-pay

## 2024-01-19 DIAGNOSIS — G8929 Other chronic pain: Secondary | ICD-10-CM | POA: Diagnosis not present

## 2024-01-19 DIAGNOSIS — M25562 Pain in left knee: Secondary | ICD-10-CM | POA: Diagnosis not present

## 2024-01-19 DIAGNOSIS — G90511 Complex regional pain syndrome I of right upper limb: Secondary | ICD-10-CM | POA: Diagnosis not present

## 2024-01-19 DIAGNOSIS — M545 Low back pain, unspecified: Secondary | ICD-10-CM | POA: Diagnosis not present

## 2024-01-19 DIAGNOSIS — M85851 Other specified disorders of bone density and structure, right thigh: Secondary | ICD-10-CM | POA: Diagnosis not present

## 2024-01-19 DIAGNOSIS — M25561 Pain in right knee: Secondary | ICD-10-CM | POA: Diagnosis not present

## 2024-01-19 DIAGNOSIS — K5909 Other constipation: Secondary | ICD-10-CM | POA: Diagnosis not present

## 2024-01-19 DIAGNOSIS — Z79899 Other long term (current) drug therapy: Secondary | ICD-10-CM | POA: Diagnosis not present

## 2024-01-19 DIAGNOSIS — M5030 Other cervical disc degeneration, unspecified cervical region: Secondary | ICD-10-CM | POA: Diagnosis not present

## 2024-01-19 DIAGNOSIS — E559 Vitamin D deficiency, unspecified: Secondary | ICD-10-CM | POA: Diagnosis not present

## 2024-01-19 DIAGNOSIS — E1169 Type 2 diabetes mellitus with other specified complication: Secondary | ICD-10-CM | POA: Diagnosis not present

## 2024-01-19 MED ORDER — PHENTERMINE HCL 37.5 MG PO TABS
37.5000 mg | ORAL_TABLET | Freq: Every day | ORAL | 0 refills | Status: DC
  Filled 2024-01-19: qty 30, 30d supply, fill #0

## 2024-01-19 MED ORDER — OXYCODONE-ACETAMINOPHEN 10-325 MG PO TABS
1.0000 | ORAL_TABLET | ORAL | 0 refills | Status: AC
  Filled 2024-01-19: qty 180, 30d supply, fill #0

## 2024-01-20 ENCOUNTER — Ambulatory Visit

## 2024-01-20 VITALS — BP 102/70 | HR 84 | Ht 65.0 in | Wt 177.4 lb

## 2024-01-20 DIAGNOSIS — L987 Excessive and redundant skin and subcutaneous tissue: Secondary | ICD-10-CM

## 2024-01-20 DIAGNOSIS — Z9884 Bariatric surgery status: Secondary | ICD-10-CM | POA: Diagnosis not present

## 2024-01-20 DIAGNOSIS — M545 Low back pain, unspecified: Secondary | ICD-10-CM

## 2024-01-20 DIAGNOSIS — E65 Localized adiposity: Secondary | ICD-10-CM

## 2024-01-20 DIAGNOSIS — Z6829 Body mass index (BMI) 29.0-29.9, adult: Secondary | ICD-10-CM

## 2024-01-20 DIAGNOSIS — R21 Rash and other nonspecific skin eruption: Secondary | ICD-10-CM | POA: Diagnosis not present

## 2024-01-20 NOTE — Progress Notes (Signed)
 Plastic & Reconstructive Surgery New Patient Visit  Patient: Angelica Ortiz MRN: [id number] Date: 01/20/24  Referring Physician: Celestia Rosaline SQUIBB, NP  Chief Complaint: Excess skin   History of Present Illness:  This is a 58 y.o. female with PMH and PSH as presented below who presents for consultation for body contouring following massive weight loss.  The patient underwent laparoscopic RYGB in 2016. Prior to the surgery, her highest weight was 347 lbs. Since then, she has lost almost 169 lbs. Her weight today is 177.4 lbs and Body mass index is 29.52 kg/m.. With the weight loss she has noted excess skin involving abdomen. Pt complains of rashes in between the skin folds which persist despite antifungal creams and powders.   Follows closely with Bariatrics [facility] Nutrition. No known nutritional issues. No hernias. Other abdominal surgeries include open cholecystectomy, C-section, appendectomy. Pt does not smoke. Functional status is good. Today, Body mass index is 29.52 kg/m.SABRA DM well controlled HbA1c 4.5. Last mammogram 01/2024 negative.   Wt Readings from Last 3 Encounters:  01/20/24 177 lb 6.4 oz (80.5 kg)  09/07/23 190 lb (86.2 kg)  07/08/23 189 lb 12.8 oz (86.1 kg)    Past Medical History: Past Medical History:  Diagnosis Date   Anemia    Arthritis    Asthma    Depression    Diabetes mellitus without complication (HCC)    Type 2   Diverticulitis    Falls    Fibromyalgia    Gait instability    GERD (gastroesophageal reflux disease)    History of urinary tract infection    Hypertension    Lupus    no meds   Migraine    Morbid obesity 03/30/2013   Overactive bladder    Overactive bladder    Pulmonary embolism (HCC)    Resolved -At age 71 broken right leg, blood clot broke off - into lung   Seasonal allergies    Sleep apnea 06/05/2013   Does not have CPAP machine   Urinary incontinence     Past Surgical History: Past Surgical History:  Procedure Laterality Date    ABDOMINAL HYSTERECTOMY     APPENDECTOMY     CESAREAN SECTION  04/08/1999   x 1   CHOLECYSTECTOMY     DILATION AND CURETTAGE OF UTERUS     x 3 for AUB   GASTRIC ROUX-EN-Y N/A 03/13/2015   Procedure: LAPAROSCOPIC ROUX-EN-Y GASTRIC BYPASS  ENTEROLYSIS OF ADHESIONS WITH UPPER ENDOSCOPY;  Surgeon: Alm Angle, MD;  Location: [facility];  Service: General;  Laterality: N/A;   HYSTEROSCOPY WITH NOVASURE N/A 03/14/2014   Procedure: HYSTEROSCOPY WITH NOVASURE;  Surgeon: Elveria Mungo, MD;  Location: [facility];  Service: Gynecology;  Laterality: N/A;   LAMINECTOMY     L4-L5   LAPAROSCOPIC LYSIS OF ADHESIONS  03/13/2015   Procedure: LAPAROSCOPIC LYSIS OF ADHESIONS;  Surgeon: Alm Angle, MD;  Location: [facility];  Service: General;;   LAPAROSCOPIC OOPHERECTOMY Right 09/05/2014   Procedure: abdominal OOPHERECTOMY;  Surgeon: Elveria Mungo, MD;  Location: [facility];  Service: Gynecology;  Laterality: Right;   LAPAROSCOPIC OVARIAN CYSTECTOMY Right 04/07/1992   ORIF ANKLE FRACTURE Left    OVARIAN CYST REMOVAL Left 09/05/2014   Procedure: OVARIAN CYSTECTOMY and portion of left ovary removed ;  Surgeon: Elveria Mungo, MD;  Location: [facility];  Service: Gynecology;  Laterality: Left;   ROTATOR CUFF REPAIR     right   ROTATOR CUFF REPAIR Right    SALPINGOOPHORECTOMY Bilateral 09/05/2014   Procedure:  BILATERAL SALPINGECTOMY;  Surgeon: Elveria Mungo, MD;  Location: [facility];  Service: Gynecology;  Laterality: Bilateral;   SHOULDER ARTHROSCOPY WITH ROTATOR CUFF REPAIR AND SUBACROMIAL DECOMPRESSION Left 11/01/2020   Procedure: SHOULDER ARTHROSCOPY WITH ROTATOR CUFF REPAIR AND SUBACROMIAL DECOMPRESSION, DISTAL CLAVICLE EXCISION;  Surgeon: Dozier Soulier, MD;  Location: [facility];  Service: Orthopedics;  Laterality: Left;   SUPRACERVICAL ABDOMINAL HYSTERECTOMY N/A 09/05/2014   Procedure: HYSTERECTOMY SUPRACERVICAL ABDOMINAL;  Surgeon: Elveria Mungo, MD;  Location: [facility];  Service:  Gynecology;  Laterality: N/A;   TONSILLECTOMY     UPPER GI ENDOSCOPY  03/13/2015   Procedure: UPPER GI ENDOSCOPY;  Surgeon: Alm Angle, MD;  Location: [facility];  Service: General;;    Current Medications: Current Outpatient Medications on File Prior to Visit  Medication Sig Dispense Refill   albuterol  (VENTOLIN  HFA) 108 (90 Base) MCG/ACT inhaler Inhale 2 puffs into the lungs every 6 (six) hours as needed for shortness of breath or wheezing. 18 g 2   BIOTIN PO Take 1 tablet by mouth daily.     brimonidine (ALPHAGAN) 0.2 % ophthalmic solution Place 1 drop into both eyes 2 (two) times daily. 15 mL 12   CALCIUM  PO Take 1 tablet by mouth daily.     clotrimazole -betamethasone  (LOTRISONE ) cream Apply 1 Application topically daily. 30 g 0   conjugated estrogens  (PREMARIN ) vaginal cream Place 1 Applicatorful vaginally daily. 42.5 g 12   cycloSPORINE (RESTASIS) 0.05 % ophthalmic emulsion Instill 1 drop into both eyes twice a day 180 each 6   diclofenac  Sodium (VOLTAREN ) 1 % GEL Apply 4 g topically 4 (four) times daily. 350 g 1   ELDERBERRY PO Take 1 tablet by mouth daily.     estradiol  (ESTRACE ) 0.1 MG/GM vaginal cream Insert 2mg  vaginally, using an applicator, once a day for 2 weeks, then insert 2g vaginally 2 to 3 days a week. 42.5 g 3   fluticasone  (FLONASE ) 50 MCG/ACT nasal spray Place 1 spray into both nostrils in the morning. 16 g 3   fluticasone -salmeterol (ADVAIR  DISKUS) 250-50 MCG/ACT AEPB Inhale 1 puff into the lungs in the morning and at bedtime. 60 each 3   Fremanezumab -vfrm (AJOVY ) 225 MG/1.5ML SOAJ Inject 225 mg into the skin every 30 (thirty) days. 1.5 mL 11   lactulose  (CHRONULAC ) 10 GM/15ML solution Take 30-60 mL  by mouth daily as needed for constipation 3785 mL 0   lactulose , encephalopathy, (CHRONULAC ) 10 GM/15ML SOLN Take 30-60 mLs (20-40 g total) by mouth daily as needed for constipation. 3785 mL 0   lactulose , encephalopathy, (CHRONULAC ) 10 GM/15ML SOLN Take 30-60 mLs (20-40 g  total) by mouth daily as needed. 3785 mL 0   lidocaine  (LIDODERM ) 5 % Place 3 patches onto the skin daily.Remove after 12 hours. 30 patch 3   MAGNESIUM PO Take 1 tablet by mouth daily.     Multiple Vitamin (MULTIVITAMIN WITH MINERALS) TABS tablet Take 1 tablet by mouth 2 (two) times daily.     naloxone  (NARCAN ) nasal spray 4 mg/0.1 mL Use 1 spray every 3 minutes; spray 1 dose into ONE nostril; alternate nostrils w each dose until help arrives 2 each 1   NURTEC 75 MG TBDP Take 1 tablet (75 mg total) by mouth daily as needed (migraine). 48 tablet 4   oxyCODONE -acetaminophen  (PERCOCET) 10-325 MG tablet Take 1 tablet by mouth every 4 to 6 hours as needed for pain. 180 tablet 0   oxyCODONE -acetaminophen  (PERCOCET) 10-325 MG tablet Take 1 tablet by mouth every 4-6 hours as  needed for pain. 180 tablet 0   oxyCODONE -acetaminophen  (PERCOCET) 10-325 MG tablet Take 1 tablet by mouth every 4 to 6 hours as needed. 180 tablet 0   oxyCODONE -acetaminophen  (PERCOCET) 10-325 MG tablet Take 1 tablet by mouth 4 to 6 hours as needed for pain. 180 tablet 0   phentermine  (ADIPEX-P ) 37.5 MG tablet Take 1 tablet (37.5 mg total) by mouth daily. 30 tablet 0   phentermine  37.5 MG capsule Take 1 capsule (37.5 mg total) by mouth daily. 30 capsule 0   pregabalin  (LYRICA ) 50 MG capsule Take 1 capsule (50 mg total) by mouth 2 (two) times daily. 180 capsule 1   progesterone  (PROMETRIUM ) 100 MG capsule Take 1-2 capsules (100-200 mg total) by mouth at bedtime. 180 capsule 1   QUEtiapine  (SEROQUEL ) 50 MG tablet Take 1 tablet (50 mg total) by mouth at bedtime. 90 tablet 1   rosuvastatin  (CRESTOR ) 5 MG tablet Take 1 tablet (5 mg total) by mouth daily. 90 tablet 2   Tenapanor  HCl (IBSRELA ) 50 MG TABS Take 1 tablet (50 mg) by mouth 2 (two) times daily. 180 tablet 1   tirzepatide  (MOUNJARO ) 15 MG/0.5ML Pen Inject 15 mg into the skin once a week. 6 mL 4   vitamin B-12 (CYANOCOBALAMIN) 100 MCG tablet Take 100 mcg by mouth daily.      oxyCODONE -acetaminophen  (PERCOCET) 10-325 MG tablet Take 1 tablet by mouth 4 to 6 hours as needed for pain. 180 tablet 0   oxyCODONE -acetaminophen  (PERCOCET) 10-325 MG tablet Take 1 tablet by mouth every four to 6 (six) hours as needed for pain. 180 tablet 0   phentermine  30 MG capsule Take 1 capsule (30 mg total) by mouth daily. 30 capsule 0   No current facility-administered medications on file prior to visit.    Allergies: Allergies  Allergen Reactions   Dilaudid  [Hydromorphone  Hcl] Hives and Itching   Iodinated Contrast Media Hives and Itching    Pt given 4 hour prep 06/21/20, c/o itching after injection- [facility]. 03/27/23: reacted to 13 HOUR PREP, c/o itching after receiving contrast.    Levaquin [Levofloxacin] Anaphylaxis   Tangerine Flavoring Agent (Non-Screening) Swelling    Tangerine fruit: Lip and tongue swelling with tangerines   Ace Inhibitors Swelling   Iodine-131 Itching and Rash    Family History:  Family history is negative for bleeding/clotting disorders, problems with anesthesia, or connective tissue disorders.   Social History:  Social History   Socioeconomic History   Marital status: Divorced    Spouse name: Not on file   Number of children: Not on file   Years of education: Not on file   Highest education level: Associate degree: occupational, Scientist, product/process development, or vocational program  Occupational History   Not on file  Tobacco Use   Smoking status: Never   Smokeless tobacco: Never  Vaping Use   Vaping status: Never Used  Substance and Sexual Activity   Alcohol use: No   Drug use: Yes    Types: PCP   Sexual activity: Not Currently    Birth control/protection: None  Other Topics Concern   Not on file  Social History Narrative   Not on file   Social Drivers of Health   Financial Resource Strain: Medium Risk (07/08/2023)   Overall Financial Resource Strain (CARDIA)    Difficulty of Paying Living Expenses: Somewhat hard  Food Insecurity: Low Risk  (11/10/2023)    Received from [facility]   Hunger Vital Sign    Within the past 12 months, you worried  that your food would run out before you got money to buy more: Never true    Within the past 12 months, the food you bought just didn't last and you didn't have money to get more. : Never true  Transportation Needs: No Transportation Needs (11/10/2023)   Received from [facility]    In the past 12 months, has lack of reliable transportation kept you from medical appointments, meetings, work or from getting things needed for daily living? : No  Physical Activity: Insufficiently Active (07/08/2023)   Exercise Vital Sign    Days of Exercise per Week: 3 days    Minutes of Exercise per Session: 30 min  Stress: Stress Concern Present (07/08/2023)   [employer] of Occupational Health - Occupational Stress Questionnaire    Feeling of Stress : Very much  Social Connections: Socially Integrated (07/08/2023)   Social Connection and Isolation Panel    Frequency of Communication with Friends and Family: More than three times a week    Frequency of Social Gatherings with Friends and Family: Once a week    Attends Religious Services: More than 4 times per year    Active Member of [employer] or Organizations: Yes    Attends Engineer, structural: More than 4 times per year    Marital Status: Living with partner   Smoker Denies Recreational Drug use: Denies  Review of systems: 10 point review of systems performed and negative except as noted in the HPI  Physical Exam: BP 102/70 (BP Location: Left Arm, Patient Position: Sitting)   Pulse 84   Ht 5' 5 (1.651 m)   Wt 177 lb 6.4 oz (80.5 kg)   LMP  (LMP Unknown) Comment: full hysterectomy  SpO2 100%   BMI 29.52 kg/m  Body mass index is 29.52 kg/m.  Physical Exam           General: Well appearing, no apparent distress. Pulm: Breathing comfortably on room air without sounds/wheezing. CV: Regular rate. Good perfusion of  extremities. Chest: Chest wall without abnormality or obvious deformity. No scoliosis, pectus excavatum or pectus carinatum. Breast: Grade 3 ptotic breasts bilaterally. Excess skin of lateral chest extending from IMF with secondary excess skin roll in axillary area. Abdomen: Soft, non-tender, no distension. No palpable hernia or bulge. Approximately 5 cm diastasis. Global lipodystrophy of abdomen with excess skin in both horizontal and vertical dimensions. Skin excess in infraumbilical region with infraumbilical fat deposits.Overhanging pannus. Redundant/ptotic mons tissue.  Excess skin extends circumferentially along posterior trunk. Current intertriginous rash. Well healed scars present.  Buttock: Loss of adiposity, decreased projection, ptosis. Lengthening of gluteal fold. Neuro: Moving all four extremities spontaneously. Psych: Appropriate mood and affect.  Labs: Recent Results (from the past 2160 hours)  GeneConnect Molecular Screen - Blood ([facility])     Status: None   Collection Time: 11/19/23 10:30 AM  Result Value Ref Range   Genetic Analysis Overall Interpretation Negative    Genetic Disease Assessed      This is a screening test and does not detect all pathogenic or likely pathogenic variant(s) in the tested genes; diagnostic testing is recommended for individuals with a personal or family history of heart disease or hereditary cancer. [facility] Tier One  Population Screen is a screening test that analyzes 11 genes related to hereditary breast and ovarian cancer (HBOC) syndrome, Lynch syndrome, and familial hypercholesterolemia. This test only reports clinically significant pathogenic and likely  pathogenic variants but does not report variants  of uncertain significance (VUS). In addition, analysis of the PMS2 gene excludes exons 11-15, which overlap with a known pseudogene (PMS2CL).    Genetic Analysis Report      No pathogenic or likely pathogenic variants were detected  in the genes analyzed by this test.Genetic test results should be interpreted in the context of an individual's personal medical and family history. Alteration to medical management is NOT  recommended based solely on this result. Clinical correlation is advised.Additional Considerations- This is a screening test; individuals may still carry pathogenic or likely pathogenic variant(s) in the tested genes that are not detected by this test.-  For individuals at risk for these or other related conditions based on factors including personal or family history, diagnostic testing is recommended.- The absence of pathogenic or likely pathogenic variant(s) in the analyzed genes, while reassuring,  does not eliminate the possibility of a hereditary condition; there are other variants and genes associated with heart disease and hereditary cancer that are not included in this test.    Genes Tested See Notes     Comment: APOB, BRCA1, BRCA2, EPCAM, LDLR, LDLRAP1, PCSK9, PMS2, MLH1, MSH2, MSH6   Disclaimer See Notes     Comment: This test was developed and validated by [facility] This test has not been cleared or approved by the [place]  Food and Drug Administration (FDA). The [facility] laboratory is accredited by the College of American Pathologists (CAP) and certified under  the Clinical Laboratory Improvement Amendments (CLIA #: [id number]) to perform high-complexity clinical tests. This test is used for clinical purposes. It should not be regarded as investigational or for research.    Sequencing Location See Notes     Comment: Sequencing done at [facility] [address], [address], [place] [address] (CLIA# [id number])   Interpretation Methods and Limitations See Notes     Comment: Extracted DNA is enriched for targeted regions and then sequenced using the [facility] Exome+ (R) assay on an Illumina DNA sequencing system. Data is then aligned to a modified version of GRCh38 and all genes are analyzed  using the MANE transcript and MANE  Plus Clinical transcript, when available. Small variant calling is completed using a customized version of [employer]'s DNAseq software, augmented by a proprietary small variant caller for difficult variants. Copy number variants (CNVs) are then called  using a proprietary bioinformatics pipeline based on depth analysis with a comparison to similarly sequenced samples. Analysis of the PMS2 gene is limited to exons 1-10. The interpretation and reporting of variants in APOB, PCSK9, and LDLR is specific to  familial hypercholesterolemia; variants associated with hypobetalipoproteinemia are not included. Interpretation is based upon guidelines published by the [employer] and Genomics [employer]), the Association for Mol ecular Pathology  (AMP) or their modification by [employer] when available and/or review of previous clinical assertions available in the [employer]. Interpretation is limited to the transcripts indicated on the report and +/- 10 bp into  intronic regions, except as noted below. [facility] variant classifications include pathogenic, likely pathogenic, variant of uncertain significance (VUS), likely benign, and benign. Only variants classified as pathogenic and likely pathogenic are included in  the report. All reported variants are confirmed through secondary manual inspection of DNA sequence data or orthogonal testing. Risk estimations and management guidelines included in this report are based on analysis of primary literature and  recommendations of applicable professional societies, and should be regarded as approximations.Based on validation studies, this assay  delivers > 99% sensitivity and specificity for single nucleotide variants and insertions  and deletions (indels) up to  20 bp. Larger indels and complex variants are also reported but sensitivity may be reduced. Based on validation studies, this assay  delivers > 99% sensitivity to multi-exon CNVs and > 90% sensitivity to single-exon CNVs. This test may not detect variants  in challenging regions (such as short tandem repeats, homopolymer runs, and segment duplications), sub-exonic CNVs, chromosomal aneuploidy, or variants in the presence of mosaicism. Phasing will be attempted and reported, when possible. Structural  rearrangements such as inversions, translocations, and gene conversions are not tested in this assay unless explicitly indicated. Additionally, deep intronic, promoter, and enhancer regions may not be covered. It is important to note that this is a  screening test and cannot detect all disease-causing variants. A negative result does not guarantee the absence of a rare, undetectable variant in the genes analyzed; consider using a diagnostic test if there i s significant personal and/or family history  of one of the conditions analyzed by this test. Any potential incidental findings outside of these genes and conditions will not be identified, nor reported. The results of a genetic test may be influenced by various factors, including bone marrow  transplantation, blood transfusions, or in rare cases, hematolymphoid neoplasms.Gene Specific Notes:APOB: analysis is limited to c.10580G>A and c.10579C>T; BRCA1: sequencing analysis extends to CDS +/-20 bp; BRCA2: sequencing analysis extends to CDS  +/-20 bp. EPCAM: analysis is limited to CNVof exons 8-9; LDLR: analysis includes CNV ofthe promoter; MLH1: analysis includes CNV of the promoter; PMS2: analysis is limited to exons 1-10.Donnice JINNY Kemp, PhD, [email or web address]     Pertinent Imaging:  None  Assessment: This is a 58 y.o. female with history of massive weight loss who presents for consultation for body contouring. The patient has maintained a 169 lb weight loss for over 12 months and has excess skin on their abdomen. Based on the patient's exam and discussion of patient's  goals today, I believe the patient would be an appropriate candidate for panniculectomy versus fleur de lis abdominoplasty with rectus muscle plication and liposuction of bilateral flanks.   We discussed the patient's goals and priorities at length. We reviewed that body contouring operations trade better contours at the expense of long, sometimes wide scars. We further discussed the operation(s) in detail including the incision and scar patterns, need for surgical drains and importance of postoperative compression. We reviewed that massive weight loss patients are at a higher risk for recurrent laxity and wound healing problems including dehiscence and infection. We further discussed the risks of bleeding, seroma, hematoma, asymmetries, standing cutaneous deformities or other contour deformities, asymmetry, skin/nipple/umbilical necrosis, fat necrosis/oil cysts, lymphedema, chronic wounds or sinus tracts, hypertrophic and keloid scarring, nerve injury (ie. LFCN, iliohypogastric, ilioinguinal nerves), numbness, paresthesia and sensory changes, intraabdominal injury. Revision procedures are not uncommon. We also discussed the risk of DVT/PE, fat embolism, heart attack, stroke, death as well as the risks of anesthesia. We reviewed the expected recovery period with no heavy activities or lifting >5lbs for 6 weeks postoperatively. Explained to patient that open cholecystectomy abdominal scar increases the risk of flap necrosis and complications.   Timing of body contouring procedures should allow 12 months after bariatric surgery and 6 months at a stable weight for patients to achieve metabolic and nutritional homeostasis.  The patient's BMI today is Body mass index is 29.52 kg/m.SABRA We discussed that patients with BMI>30 are more likely to suffer both minor  and major complications following these procedures. The patient verbalized understanding of this concept and is willing to accept these increased risks.    The patient voiced understanding and wishes to proceed. All questions and concerns were addressed to the patient's apparent satisfaction.  Plan - We will plan for panniculectomy versus fleur de lis abdominoplasty with rectus muscle plication and liposuction of bilateral flanks.  - Photographic consent obtained today.  - 5 hours under general anesthesia. - Will submit for pre-determination with insurance. - Scheduling pending insurance.  - PCP clearance   The sensitive parts of the examination/procedure were performed with MA as chaperone.  The time documented represents the total time spent on the day of the encounter in preparing for and completing the visit. It does not include time spent by ancillary staff, a resident, a fellow, another trainee, or, for shared visits, time spent jointly with the patient or discussing the case or the performance of other separately performed services.  Time spent: 45 minutes.     Jrue Yambao, MD [facility]  01/20/2024 3:46 PM

## 2024-01-20 NOTE — Progress Notes (Signed)
 Angelica Ortiz                                          MRN: [id number]   01/20/2024   The VBCI Quality Team Specialist reviewed this patient medical record for the purposes of chart review for care gap closure. The following were reviewed: abstraction for care gap closure-breast cancer screening.    VBCI Quality Team

## 2024-01-21 DIAGNOSIS — Z79899 Other long term (current) drug therapy: Secondary | ICD-10-CM | POA: Diagnosis not present

## 2024-01-28 ENCOUNTER — Other Ambulatory Visit: Payer: Self-pay

## 2024-01-28 MED ORDER — ESTRADIOL 0.01 % VA CREA
TOPICAL_CREAM | VAGINAL | 3 refills | Status: AC
  Filled 2024-01-28: qty 42.5, 30d supply, fill #0
  Filled 2024-02-11: qty 42.5, 28d supply, fill #0
  Filled 2024-03-10: qty 42.5, 28d supply, fill #1
  Filled 2024-04-05: qty 42.5, 28d supply, fill #2
  Filled 2024-05-01: qty 42.5, 28d supply, fill #3

## 2024-01-28 MED ORDER — PROGESTERONE MICRONIZED 100 MG PO CAPS
100.0000 mg | ORAL_CAPSULE | Freq: Every day | ORAL | 1 refills | Status: AC
  Filled 2024-02-11 – 2024-05-01 (×4): qty 180, 90d supply, fill #0

## 2024-02-03 ENCOUNTER — Other Ambulatory Visit: Payer: Self-pay

## 2024-02-03 MED ORDER — PROGESTERONE MICRONIZED 100 MG PO CAPS: 100.0000 mg | ORAL_CAPSULE | Freq: Every day | ORAL | 1 refills | Status: AC

## 2024-02-05 ENCOUNTER — Other Ambulatory Visit: Payer: Self-pay

## 2024-02-08 ENCOUNTER — Other Ambulatory Visit: Payer: Self-pay

## 2024-02-11 ENCOUNTER — Other Ambulatory Visit: Payer: Self-pay

## 2024-02-11 ENCOUNTER — Other Ambulatory Visit (INDEPENDENT_AMBULATORY_CARE_PROVIDER_SITE_OTHER): Payer: Self-pay | Admitting: Primary Care

## 2024-02-11 DIAGNOSIS — R7303 Prediabetes: Secondary | ICD-10-CM

## 2024-02-12 ENCOUNTER — Ambulatory Visit: Payer: Self-pay

## 2024-02-12 ENCOUNTER — Other Ambulatory Visit: Payer: Self-pay

## 2024-02-12 MED ORDER — MOUNJARO 15 MG/0.5ML ~~LOC~~ SOAJ
15.0000 mg | SUBCUTANEOUS | 4 refills | Status: AC
  Filled 2024-02-12: qty 4, 56d supply, fill #0
  Filled 2024-02-12 (×2): qty 6, 84d supply, fill #0
  Filled 2024-05-01: qty 6, 84d supply, fill #1

## 2024-02-12 NOTE — Telephone Encounter (Signed)
 FYI Only or Action Required?: Action required by provider: lab or test result follow-up needed.  Patient was last seen in primary care on 09/07/2023 by Celestia Rosaline SQUIBB, NP.  Called Nurse Triage reporting Results.   Triage Disposition: Call PCP When Office is Open  Patient/caregiver understands and will follow disposition?: Yes      Copied from CRM [id number]. Topic: Clinical - Red Word Triage >> Feb 12, 2024  3:39 PM Tiffini S wrote: [facility] that prompted transfer to Nurse Triage: Patient had a missed call from Jaya- was told to call back for lab results   Called CAL, was told to have triage nurse discuss results with patient      Reason for Disposition  Caller requesting routine or non-urgent lab result  Answer Assessment - Initial Assessment Questions Patient was called in regards to her daughter's lab results. CRM sent on patient instead of her daughter. When the patient came on the line she asked only to speak with Jaya, who she stated called her. I called the CAL who informed me that Jaya was busy and that they would see if she can call the patient back. The patient is agreeable with this plan.  Protocols used: PCP Call - No Triage-A-AH

## 2024-02-12 NOTE — Telephone Encounter (Signed)
Will forward to the provider 

## 2024-02-15 ENCOUNTER — Other Ambulatory Visit: Payer: Self-pay

## 2024-02-16 NOTE — Telephone Encounter (Signed)
 In regards to pt labs. This is the mother

## 2024-02-19 ENCOUNTER — Other Ambulatory Visit: Payer: Self-pay

## 2024-02-19 MED ORDER — OXYCODONE-ACETAMINOPHEN 10-325 MG PO TABS
1.0000 | ORAL_TABLET | ORAL | 0 refills | Status: DC | PRN
  Filled 2024-02-19: qty 180, 30d supply, fill #0

## 2024-02-19 MED ORDER — PHENTERMINE HCL 37.5 MG PO TABS
37.5000 mg | ORAL_TABLET | Freq: Every day | ORAL | 1 refills | Status: DC
  Filled 2024-02-19: qty 30, 30d supply, fill #0
  Filled 2024-03-18: qty 30, 30d supply, fill #1

## 2024-03-02 ENCOUNTER — Other Ambulatory Visit: Payer: Self-pay

## 2024-03-02 ENCOUNTER — Emergency Department (HOSPITAL_COMMUNITY)

## 2024-03-02 ENCOUNTER — Emergency Department (HOSPITAL_COMMUNITY)
Admission: EM | Admit: 2024-03-02 | Discharge: 2024-03-02 | Disposition: A | Discharge status: Home / Self Care | Attending: Emergency Medicine | Admitting: Emergency Medicine

## 2024-03-02 DIAGNOSIS — R519 Headache, unspecified: Secondary | ICD-10-CM | POA: Diagnosis present

## 2024-03-02 DIAGNOSIS — Y92410 Unspecified street and highway as the place of occurrence of the external cause: Secondary | ICD-10-CM | POA: Insufficient documentation

## 2024-03-02 DIAGNOSIS — M542 Cervicalgia: Secondary | ICD-10-CM | POA: Diagnosis not present

## 2024-03-02 DIAGNOSIS — M545 Low back pain, unspecified: Secondary | ICD-10-CM | POA: Diagnosis not present

## 2024-03-02 MED ORDER — LIDOCAINE 5 % EX PTCH
1.0000 | MEDICATED_PATCH | CUTANEOUS | Status: DC
  Administered 2024-03-02: 1 via TRANSDERMAL
  Filled 2024-03-02: qty 1

## 2024-03-02 MED ORDER — OXYCODONE HCL 5 MG PO TABS
10.0000 mg | ORAL_TABLET | Freq: Once | ORAL | Status: AC
Start: 2024-03-02 — End: 2024-03-02
  Administered 2024-03-02: 10 mg via ORAL
  Filled 2024-03-02: qty 2
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
## 2024-03-02 NOTE — Discharge Instructions (Addendum)
 Continue with home medications.  You can also apply ice or heat over area of pain return to emergency room with new or worsening symptoms.
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
## 2024-03-10 ENCOUNTER — Other Ambulatory Visit: Payer: Self-pay

## 2024-03-11 ENCOUNTER — Other Ambulatory Visit: Payer: Self-pay

## 2024-03-18 ENCOUNTER — Other Ambulatory Visit: Payer: Self-pay

## 2024-03-18 ENCOUNTER — Other Ambulatory Visit (INDEPENDENT_AMBULATORY_CARE_PROVIDER_SITE_OTHER): Payer: Self-pay | Admitting: Primary Care

## 2024-03-19 ENCOUNTER — Other Ambulatory Visit: Payer: Self-pay

## 2024-03-19 ENCOUNTER — Other Ambulatory Visit (HOSPITAL_COMMUNITY): Payer: Self-pay

## 2024-03-19 MED ORDER — OXYCODONE-ACETAMINOPHEN 10-325 MG PO TABS
1.0000 | ORAL_TABLET | ORAL | 0 refills | Status: AC
  Filled 2024-03-19: qty 180, 30d supply, fill #0

## 2024-03-21 ENCOUNTER — Other Ambulatory Visit (HOSPITAL_COMMUNITY): Payer: Self-pay

## 2024-03-22 ENCOUNTER — Other Ambulatory Visit (HOSPITAL_COMMUNITY): Payer: Self-pay

## 2024-04-05 ENCOUNTER — Other Ambulatory Visit: Payer: Self-pay

## 2024-04-08 ENCOUNTER — Other Ambulatory Visit: Payer: Self-pay

## 2024-04-09 ENCOUNTER — Other Ambulatory Visit: Payer: Self-pay

## 2024-04-09 ENCOUNTER — Other Ambulatory Visit (INDEPENDENT_AMBULATORY_CARE_PROVIDER_SITE_OTHER): Payer: Self-pay | Admitting: Primary Care

## 2024-04-09 ENCOUNTER — Other Ambulatory Visit: Payer: Self-pay | Admitting: Neurology

## 2024-04-09 DIAGNOSIS — J4520 Mild intermittent asthma, uncomplicated: Secondary | ICD-10-CM

## 2024-04-11 ENCOUNTER — Other Ambulatory Visit: Payer: Self-pay

## 2024-04-11 NOTE — Telephone Encounter (Signed)
 Will forward to provider

## 2024-04-12 ENCOUNTER — Other Ambulatory Visit: Payer: Self-pay

## 2024-04-12 MED ORDER — AJOVY 225 MG/1.5ML ~~LOC~~ SOAJ
225.0000 mg | SUBCUTANEOUS | 11 refills | Status: AC
  Filled 2024-04-12: qty 1.5, 28d supply, fill #0
  Filled 2024-05-13: qty 1.5, 28d supply, fill #1

## 2024-04-18 ENCOUNTER — Other Ambulatory Visit: Payer: Self-pay

## 2024-04-18 MED ORDER — ALBUTEROL SULFATE HFA 108 (90 BASE) MCG/ACT IN AERS
2.0000 | INHALATION_SPRAY | Freq: Four times a day (QID) | RESPIRATORY_TRACT | 2 refills | Status: AC | PRN
  Filled 2024-04-18: qty 6.7, 25d supply, fill #0

## 2024-04-18 MED ORDER — PREGABALIN 50 MG PO CAPS
50.0000 mg | ORAL_CAPSULE | Freq: Two times a day (BID) | ORAL | 1 refills | Status: AC
  Filled 2024-04-18: qty 180, 90d supply, fill #0

## 2024-04-18 MED ORDER — FLUTICASONE PROPIONATE 50 MCG/ACT NA SUSP
1.0000 | Freq: Every morning | NASAL | 3 refills | Status: AC
  Filled 2024-04-18: qty 16, 60d supply, fill #0

## 2024-04-19 ENCOUNTER — Other Ambulatory Visit: Payer: Self-pay

## 2024-04-19 MED ORDER — PHENTERMINE HCL 37.5 MG PO TABS
37.5000 mg | ORAL_TABLET | Freq: Every day | ORAL | 0 refills | Status: AC
  Filled 2024-04-19: qty 30, 30d supply, fill #0

## 2024-04-19 MED ORDER — OXYCODONE-ACETAMINOPHEN 10-325 MG PO TABS
1.0000 | ORAL_TABLET | ORAL | 0 refills | Status: AC | PRN
  Filled 2024-04-19: qty 180, 30d supply, fill #0

## 2024-04-19 MED ORDER — CYCLOBENZAPRINE HCL 10 MG PO TABS
5.0000 mg | ORAL_TABLET | Freq: Three times a day (TID) | ORAL | 0 refills | Status: AC | PRN
  Filled 2024-04-19: qty 90, 30d supply, fill #0

## 2024-04-27 ENCOUNTER — Other Ambulatory Visit: Payer: Self-pay

## 2024-04-27 ENCOUNTER — Telehealth: Payer: 59 | Admitting: Neurology

## 2024-04-27 DIAGNOSIS — G43709 Chronic migraine without aura, not intractable, without status migrainosus: Secondary | ICD-10-CM

## 2024-04-27 DIAGNOSIS — G43809 Other migraine, not intractable, without status migrainosus: Secondary | ICD-10-CM

## 2024-04-27 MED ORDER — NURTEC 75 MG PO TBDP
75.0000 mg | ORAL_TABLET | Freq: Every day | ORAL | 11 refills | Status: AC | PRN
  Filled 2024-04-27: qty 8, 8d supply, fill #0
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
## 2024-04-27 NOTE — Patient Instructions (Signed)
 Great to see you today! Continue current medication for migraine management Call for worsening headaches Follow-up 1 year.  Thanks!!

## 2024-05-01 ENCOUNTER — Other Ambulatory Visit: Payer: Self-pay | Admitting: Family Medicine

## 2024-05-01 MED ORDER — FLUTICASONE-SALMETEROL 250-50 MCG/ACT IN AEPB
1.0000 | INHALATION_SPRAY | Freq: Two times a day (BID) | RESPIRATORY_TRACT | 0 refills | Status: AC
  Filled 2024-05-01: qty 60, 30d supply, fill #0

## 2024-05-02 ENCOUNTER — Other Ambulatory Visit: Payer: Self-pay

## 2024-05-10 ENCOUNTER — Other Ambulatory Visit: Payer: Self-pay

## 2024-05-10 MED ORDER — PROGESTERONE MICRONIZED 100 MG PO CAPS
100.0000 mg | ORAL_CAPSULE | Freq: Every day | ORAL | 1 refills | Status: AC
  Filled 2024-05-10: qty 180, 90d supply, fill #0

## 2024-05-10 MED ORDER — ESTRADIOL 0.01 % VA CREA
TOPICAL_CREAM | VAGINAL | 0 refills | Status: AC
  Filled 2024-05-10: qty 42.5, 90d supply, fill #0

## 2024-05-13 ENCOUNTER — Other Ambulatory Visit: Payer: Self-pay

## 2025-05-03 ENCOUNTER — Telehealth: Admitting: Neurology
